# Patient Record
Sex: Male | Born: 1955 | Race: Black or African American | Hispanic: No | Smoking: Current some day smoker
Health system: Southern US, Community
[De-identification: ages and names within clinical notes are randomized; demographics above are authoritative.]

## PROBLEM LIST (undated history)

## (undated) DIAGNOSIS — J449 Chronic obstructive pulmonary disease, unspecified: Secondary | ICD-10-CM

## (undated) DIAGNOSIS — K219 Gastro-esophageal reflux disease without esophagitis: Secondary | ICD-10-CM

## (undated) DIAGNOSIS — C61 Malignant neoplasm of prostate: Secondary | ICD-10-CM

## (undated) DIAGNOSIS — I10 Essential (primary) hypertension: Secondary | ICD-10-CM

## (undated) DIAGNOSIS — J45909 Unspecified asthma, uncomplicated: Secondary | ICD-10-CM

---

## 1998-08-16 ENCOUNTER — Emergency Department (HOSPITAL_COMMUNITY): Admission: EM | Admit: 1998-08-16 | Discharge: 1998-08-16 | Payer: Self-pay | Admitting: Emergency Medicine

## 1998-11-01 ENCOUNTER — Emergency Department (HOSPITAL_COMMUNITY): Admission: EM | Admit: 1998-11-01 | Discharge: 1998-11-01 | Payer: Self-pay | Admitting: Emergency Medicine

## 1998-11-01 ENCOUNTER — Encounter: Payer: Self-pay | Admitting: Emergency Medicine

## 1999-08-08 ENCOUNTER — Emergency Department (HOSPITAL_COMMUNITY): Admission: EM | Admit: 1999-08-08 | Discharge: 1999-08-08 | Payer: Self-pay | Admitting: Emergency Medicine

## 1999-08-17 ENCOUNTER — Emergency Department (HOSPITAL_COMMUNITY): Admission: EM | Admit: 1999-08-17 | Discharge: 1999-08-17 | Payer: Self-pay | Admitting: Emergency Medicine

## 1999-08-17 ENCOUNTER — Encounter: Payer: Self-pay | Admitting: Emergency Medicine

## 1999-08-23 ENCOUNTER — Emergency Department (HOSPITAL_COMMUNITY): Admission: EM | Admit: 1999-08-23 | Discharge: 1999-08-23 | Payer: Self-pay | Admitting: Emergency Medicine

## 1999-08-27 ENCOUNTER — Ambulatory Visit (HOSPITAL_COMMUNITY): Admission: RE | Admit: 1999-08-27 | Discharge: 1999-08-27 | Payer: Self-pay | Admitting: Internal Medicine

## 1999-08-27 ENCOUNTER — Encounter: Admission: RE | Admit: 1999-08-27 | Discharge: 1999-08-27 | Payer: Self-pay | Admitting: Internal Medicine

## 1999-09-10 ENCOUNTER — Encounter: Payer: Self-pay | Admitting: Emergency Medicine

## 1999-09-10 ENCOUNTER — Emergency Department (HOSPITAL_COMMUNITY): Admission: EM | Admit: 1999-09-10 | Discharge: 1999-09-10 | Payer: Self-pay | Admitting: Emergency Medicine

## 1999-10-02 ENCOUNTER — Encounter: Admission: RE | Admit: 1999-10-02 | Discharge: 1999-10-02 | Payer: Self-pay | Admitting: Internal Medicine

## 1999-10-05 ENCOUNTER — Encounter: Admission: RE | Admit: 1999-10-05 | Discharge: 1999-10-05 | Payer: Self-pay | Admitting: Internal Medicine

## 1999-11-05 ENCOUNTER — Encounter: Admission: RE | Admit: 1999-11-05 | Discharge: 1999-11-05 | Payer: Self-pay | Admitting: Internal Medicine

## 2000-04-24 ENCOUNTER — Encounter: Admission: RE | Admit: 2000-04-24 | Discharge: 2000-04-24 | Payer: Self-pay | Admitting: Hematology and Oncology

## 2001-07-21 ENCOUNTER — Encounter: Payer: Self-pay | Admitting: Emergency Medicine

## 2001-07-21 ENCOUNTER — Emergency Department (HOSPITAL_COMMUNITY): Admission: EM | Admit: 2001-07-21 | Discharge: 2001-07-21 | Payer: Self-pay | Admitting: Emergency Medicine

## 2001-08-27 ENCOUNTER — Emergency Department (HOSPITAL_COMMUNITY): Admission: EM | Admit: 2001-08-27 | Discharge: 2001-08-27 | Payer: Self-pay | Admitting: Emergency Medicine

## 2001-08-27 ENCOUNTER — Encounter: Payer: Self-pay | Admitting: Emergency Medicine

## 2002-10-26 ENCOUNTER — Emergency Department (HOSPITAL_COMMUNITY): Admission: EM | Admit: 2002-10-26 | Discharge: 2002-10-26 | Payer: Self-pay | Admitting: Emergency Medicine

## 2006-05-19 ENCOUNTER — Emergency Department (HOSPITAL_COMMUNITY): Admission: EM | Admit: 2006-05-19 | Discharge: 2006-05-19 | Payer: Self-pay | Admitting: Emergency Medicine

## 2006-06-19 ENCOUNTER — Ambulatory Visit: Payer: Self-pay | Admitting: Nurse Practitioner

## 2006-06-20 ENCOUNTER — Ambulatory Visit: Payer: Self-pay | Admitting: Nurse Practitioner

## 2006-06-23 ENCOUNTER — Ambulatory Visit (HOSPITAL_COMMUNITY): Admission: RE | Admit: 2006-06-23 | Discharge: 2006-06-23 | Payer: Self-pay | Admitting: Nurse Practitioner

## 2006-06-23 ENCOUNTER — Ambulatory Visit: Payer: Self-pay | Admitting: Nurse Practitioner

## 2015-02-12 HISTORY — PX: WRIST SURGERY: SHX841

## 2017-05-29 DIAGNOSIS — T783XXA Angioneurotic edema, initial encounter: Secondary | ICD-10-CM | POA: Insufficient documentation

## 2018-01-23 ENCOUNTER — Encounter (HOSPITAL_COMMUNITY): Payer: Self-pay

## 2018-01-23 ENCOUNTER — Emergency Department (HOSPITAL_COMMUNITY): Payer: Medicare Other

## 2018-01-23 ENCOUNTER — Emergency Department (HOSPITAL_COMMUNITY)
Admission: EM | Admit: 2018-01-23 | Discharge: 2018-01-23 | Disposition: A | Discharge status: Home / Self Care | Payer: Medicare Other | Attending: Emergency Medicine | Admitting: Emergency Medicine

## 2018-01-23 ENCOUNTER — Other Ambulatory Visit: Payer: Self-pay

## 2018-01-23 DIAGNOSIS — I10 Essential (primary) hypertension: Secondary | ICD-10-CM | POA: Insufficient documentation

## 2018-01-23 DIAGNOSIS — J441 Chronic obstructive pulmonary disease with (acute) exacerbation: Secondary | ICD-10-CM | POA: Diagnosis not present

## 2018-01-23 DIAGNOSIS — F17200 Nicotine dependence, unspecified, uncomplicated: Secondary | ICD-10-CM | POA: Diagnosis not present

## 2018-01-23 DIAGNOSIS — Z79899 Other long term (current) drug therapy: Secondary | ICD-10-CM | POA: Insufficient documentation

## 2018-01-23 DIAGNOSIS — R0602 Shortness of breath: Secondary | ICD-10-CM | POA: Diagnosis present

## 2018-01-23 HISTORY — DX: Chronic obstructive pulmonary disease, unspecified: J44.9

## 2018-01-23 HISTORY — DX: Unspecified asthma, uncomplicated: J45.909

## 2018-01-23 HISTORY — DX: Essential (primary) hypertension: I10

## 2018-01-23 LAB — CBC WITH DIFFERENTIAL/PLATELET
Abs Immature Granulocytes: 0.02 10*3/uL (ref 0.00–0.07)
Basophils Absolute: 0 10*3/uL (ref 0.0–0.1)
Basophils Relative: 1 %
Eosinophils Absolute: 0.2 10*3/uL (ref 0.0–0.5)
Eosinophils Relative: 4 %
HCT: 45.7 % (ref 39.0–52.0)
Hemoglobin: 14.8 g/dL (ref 13.0–17.0)
Immature Granulocytes: 0 %
Lymphocytes Relative: 29 %
Lymphs Abs: 1.3 10*3/uL (ref 0.7–4.0)
MCH: 28.6 pg (ref 26.0–34.0)
MCHC: 32.4 g/dL (ref 30.0–36.0)
MCV: 88.4 fL (ref 80.0–100.0)
Monocytes Absolute: 0.7 10*3/uL (ref 0.1–1.0)
Monocytes Relative: 16 %
Neutro Abs: 2.3 10*3/uL (ref 1.7–7.7)
Neutrophils Relative %: 50 %
Platelets: 220 10*3/uL (ref 150–400)
RBC: 5.17 MIL/uL (ref 4.22–5.81)
RDW: 14.3 % (ref 11.5–15.5)
WBC: 4.6 10*3/uL (ref 4.0–10.5)
nRBC: 0 % (ref 0.0–0.2)

## 2018-01-23 LAB — BASIC METABOLIC PANEL
Anion gap: 12 (ref 5–15)
BUN: 10 mg/dL (ref 8–23)
CO2: 23 mmol/L (ref 22–32)
Calcium: 8.9 mg/dL (ref 8.9–10.3)
Chloride: 105 mmol/L (ref 98–111)
Creatinine, Ser: 0.98 mg/dL (ref 0.61–1.24)
GFR calc Af Amer: >60 mL/min (ref >60)
GFR calc non Af Amer: >60 mL/min (ref >60)
Glucose, Bld: 117 mg/dL — ABNORMAL HIGH (ref 70–99)
Potassium: 4 mmol/L (ref 3.5–5.1)
Sodium: 140 mmol/L (ref 135–145)

## 2018-01-23 LAB — I-STAT ARTERIAL BLOOD GAS, ED
Bicarbonate: 26 mmol/L (ref 20.0–28.0)
O2 Saturation: 91 %
Patient temperature: 98.6
TCO2: 27 mmol/L (ref 22–32)
pCO2 arterial: 47.5 mmHg (ref 32.0–48.0)
pH, Arterial: 7.347 — ABNORMAL LOW (ref 7.350–7.450)
pO2, Arterial: 66 mmHg — ABNORMAL LOW (ref 83.0–108.0)

## 2018-01-23 LAB — I-STAT TROPONIN, ED: Troponin i, poc: 0.01 ng/mL (ref 0.00–0.08)

## 2018-01-23 MED ORDER — IPRATROPIUM-ALBUTEROL 0.5-2.5 (3) MG/3ML IN SOLN
3.0000 mL | Freq: Once | RESPIRATORY_TRACT | Status: AC
  Administered 2018-01-23: 3 mL via RESPIRATORY_TRACT
  Filled 2018-01-23: qty 3

## 2018-01-23 MED ORDER — PREDNISONE 20 MG PO TABS: 40.0000 mg | ORAL_TABLET | Freq: Every day | ORAL | 0 refills | Status: DC

## 2018-01-23 MED ORDER — METHYLPREDNISOLONE SODIUM SUCC 125 MG IJ SOLR
60.0000 mg | Freq: Once | INTRAMUSCULAR | Status: AC
  Administered 2018-01-23: 60 mg via INTRAVENOUS
  Filled 2018-01-23: qty 2

## 2018-01-23 NOTE — ED Triage Notes (Signed)
Pt states shortness of breath X2 days. States he has used inhaler at home without relief. Noticeably short of breath. Cough present, denies chest pain.

## 2018-01-23 NOTE — Discharge Instructions (Addendum)
Dustin Alvarez,   I am sorry that you were feeling poorly. It looks like you have had a COPD exacerbation which may have been caused by not having the correct medication. We did some labs and a CXR that were all normal.  Please take the prednisone 40 mg daily for the next 4 days, you have already received a dose today.  Please continue to use the symbicort inhaler 2 puffs twice a day.  Please continue using the albuterol inhaler every 6 hours as needed for shortness of breath.   Please follow up with your Primary care provider and the pulmonologist in 1-2 weeks.   If your symptoms worsen or fail to improve or you develop any new symptoms please return to the ED to be evaluated.

## 2018-01-23 NOTE — ED Provider Notes (Signed)
[place] EMERGENCY DEPARTMENT Provider Note   CSN: [id number] Arrival date & time: 01/23/18  0710     History   Chief Complaint Chief Complaint  Patient presents with  . Shortness of Breath    HPI Dustin Alvarez is a 62 y.o. male.  This is a 62 year old male with history of COPD, hypertension, hyperlipidemia, and a smoking history presenting with a 3 to 4-day course of increased shortness of breath, wheezing, dizziness in a productive cough clear sputum.  Started when he was watching a ball game at home and has been getting progressively worse, he has been using his rescue inhaler about 3-4 times a day.  He is supposed to be on Symbicort daily however he does not take this consistently.  He recently came to [place] 2 days ago to visit family from [facility]  He denies any other symptoms such as fevers, chills, nausea, vomiting, diarrhea, constipation, lightheadedness, abdominal pain chest pain.  Of note he was hospitalized April 2019 for angioedema secondary to an ACE inhibitor and hypoxemic respiratory failure due to airway obstruction, he spent a few days in the ICU due to the angioedema and was intubated for 4 days. He was supposed to follow up with pulmonolgoy but patient reports that he did not.      Past Medical History:  Diagnosis Date  . Asthma   . COPD (chronic obstructive pulmonary disease) ([place])   . Hypertension     There are no active problems to display for this patient.   Past Surgical History:  Procedure Laterality Date  . WRIST SURGERY Left 2017      Home Medications    Prior to Admission medications   Medication Sig Start Date End Date Taking? Authorizing Provider  albuterol (PROVENTIL HFA;VENTOLIN HFA) 108 (90 Base) MCG/ACT inhaler Inhale 2 puffs into the lungs every 6 (six) hours as needed for wheezing or shortness of breath.   Yes [provider]  amLODipine (NORVASC) 5 MG tablet Take 5 mg by mouth  daily. 06/05/17  Yes [provider]  budesonide-formoterol (SYMBICORT) 160-4.5 MCG/ACT inhaler Inhale 2 puffs into the lungs 2 (two) times daily. 06/04/17  Yes [provider]  hydrochlorothiazide (MICROZIDE) 12.5 MG capsule Take 12.5 mg by mouth daily. 06/04/17  Yes [provider]  predniSONE (DELTASONE) 20 MG tablet Take 2 tablets (40 mg total) by mouth daily with breakfast. 01/23/18   [name], MD    Family History Family History  Problem Relation Age of Onset  . COPD Father   . Diabetes Sister   . Diabetes Brother     Social History Social History   Tobacco Use  . Smoking status: Current Every Day Smoker    Packs/day: 0.50  . Smokeless tobacco: Never Used  Substance Use Topics  . Alcohol use: Yes    Comment: 1/2 bottle liquor every other day  . Drug use: Never     Allergies   Patient has no known allergies.   Review of Systems Review of Systems  Constitutional: Negative for appetite change, chills and fever.  HENT: Negative for sinus pressure and sinus pain.   Respiratory: Positive for cough, shortness of breath and wheezing.   Cardiovascular: Negative for chest pain and palpitations.  Gastrointestinal: Negative for abdominal pain, constipation, diarrhea, nausea and vomiting.  Musculoskeletal: Positive for neck pain. Negative for arthralgias.  Neurological: Positive for light-headedness. Negative for weakness and numbness.     Physical Exam Updated Vital Signs  BP 124/83   Pulse 93   Temp 98 F (36.7 C) (Oral)   Resp (!) 23   SpO2 93%   Physical Exam Constitutional:      General: He is in acute distress.     Appearance: He is well-developed.  HENT:     Head: Normocephalic and atraumatic.     Mouth/Throat:     Mouth: Mucous membranes are moist.     Pharynx: Oropharynx is clear.  Eyes:     Extraocular Movements: Extraocular movements intact.     Pupils: Pupils are equal, round, and reactive to light.  Neck:      Musculoskeletal: Normal range of motion and neck supple.  Cardiovascular:     Rate and Rhythm: Regular rhythm. Tachycardia present.  Pulmonary:     Effort: Tachypnea and accessory muscle usage present.     Breath sounds: Decreased breath sounds and wheezing (diffuse) present.  Chest:     Chest wall: No mass.  Neurological:     Mental Status: He is alert.      ED Treatments / Results  Labs (all labs ordered are listed, but only abnormal results are displayed) Labs Reviewed  BASIC METABOLIC PANEL - Abnormal; Notable for the following components:      Result Value   Glucose, Bld 117 (*)    All other components within normal limits  I-STAT ARTERIAL BLOOD GAS, ED - Abnormal; Notable for the following components:   pH, Arterial 7.347 (*)    pO2, Arterial 66.0 (*)    All other components within normal limits  CBC WITH DIFFERENTIAL/PLATELET  I-STAT TROPONIN, ED    EKG EKG Interpretation  Date/Time:  Friday January 23 2018 07:19:42 EST Ventricular Rate:  109 PR Interval:    QRS Duration: 86 QT Interval:  322 QTC Calculation: 434 R Axis:   86 Text Interpretation:  Sinus tachycardia Ventricular premature complex Right atrial enlargement Borderline right axis deviation Anterior injury pattern No significant change since last tracing Abnormal ekg Confirmed by Carmin Muskrat [phone]) on 01/23/2018 7:45:55 AM   Radiology Dg Chest 2 View  Result Date: 01/23/2018 CLINICAL DATA:  Short of breath for 2 days, COPD, asthma, smoker EXAM: CHEST - 2 VIEW COMPARISON:  06/23/2006 FINDINGS: Normal heart size, mediastinal contours, and pulmonary vascularity. Medial RIGHT apical scarring stable. Lungs hyperinflated but clear. No acute infiltrate, pleural effusion, or pneumothorax. Osseous structures unremarkable. IMPRESSION: COPD changes without acute infiltrate. Electronically Signed   By: Lavonia Dana M.D.   On: 01/23/2018 08:23    Procedures Procedures (including critical care  time)  Medications Ordered in ED Medications  methylPREDNISolone sodium succinate (SOLU-MEDROL) 125 mg/2 mL injection 60 mg (60 mg Intravenous Given 01/23/18 0829)  ipratropium-albuterol (DUONEB) 0.5-2.5 (3) MG/3ML nebulizer solution 3 mL (3 mLs Nebulization Given 01/23/18 0823)  ipratropium-albuterol (DUONEB) 0.5-2.5 (3) MG/3ML nebulizer solution 3 mL (3 mLs Nebulization Given 01/23/18 0907)     Initial Impression / Assessment and Plan / ED Course  I have reviewed the triage vital signs and the nursing notes.  Pertinent labs & imaging results that were available during my care of the patient were reviewed by me and considered in my medical decision making (see chart for details).     This is a 62 year old male with history of COPD, hypertension, tobacco use who presented with a 3 to 4-day history of worsening shortness of breath, wheezing, dizziness.  Has had a increase in his rescue inhaler up to 3-4 times a day.  He denied  any signs of infection, and it not having any chest pain or abdominal pain.  He still smoking half pack per day and has had a recent sick contact. Will assess for cause of his COPD exacerbation with CBC, CMP, chest x-ray, troponin.  Patient was given a course of DuoNeb's and steroid which he reports helped a lot, on recheck his wheezing has improved and he is not using accessory muscles to breath.   CXR was negative. CBC is normal, ABG was unremarkable, slight respiratory acidosis but the pH is 7.347. BMP was normal. Troponin was negative. Patient was given another breathing treatment and he reports significant improvement. He appears comfortable and his lung exam showed minimal wheezing.   This is likely a COPD exacerbation 2/2 to medication non-compliance. Will prescribe a 5 day course of prednisone and advise patient to continue his Symbicort on a daily basis and use albuterol as needed. Advised patient to follow up with his PCP and the pulmonologist.   Final Clinical  Impressions(s) / ED Diagnoses   Final diagnoses:  COPD exacerbation ([place])  Shortness of breath    ED Discharge Orders         Ordered    predniSONE (DELTASONE) 20 MG tablet  Daily with breakfast     01/23/18 1003           [name], MD 01/23/18 1120    Carmin Muskrat, MD 01/23/18 1636

## 2019-09-22 ENCOUNTER — Ambulatory Visit (INDEPENDENT_AMBULATORY_CARE_PROVIDER_SITE_OTHER): Payer: Medicare Other | Admitting: Internal Medicine

## 2019-09-22 ENCOUNTER — Encounter: Payer: Self-pay | Admitting: Internal Medicine

## 2019-09-22 ENCOUNTER — Other Ambulatory Visit: Payer: Self-pay

## 2019-09-22 DIAGNOSIS — C61 Malignant neoplasm of prostate: Secondary | ICD-10-CM | POA: Diagnosis present

## 2019-09-22 DIAGNOSIS — T464X5A Adverse effect of angiotensin-converting-enzyme inhibitors, initial encounter: Secondary | ICD-10-CM | POA: Insufficient documentation

## 2019-09-22 DIAGNOSIS — T783XXA Angioneurotic edema, initial encounter: Secondary | ICD-10-CM | POA: Insufficient documentation

## 2019-09-22 DIAGNOSIS — C7951 Secondary malignant neoplasm of bone: Secondary | ICD-10-CM | POA: Insufficient documentation

## 2019-09-22 NOTE — Patient Instructions (Addendum)
Thank you for allowing [place] to provide your care today.  He came into establish care with [place]. We will request your prior oncologist and primary care to send [place] your medical record. Please take your medications as instructed by your oncologist and your prior PCP.  Follow up in our clinic in 1-2 weeks to establish care.   As always, if having severe symptoms, please seek medical attention at emergency room. Should you have any questions or concerns please call the internal medicine clinic at [phone].    Thank you!

## 2019-09-22 NOTE — Progress Notes (Addendum)
**Note Dustin-Identified via Obfuscation** New Patient Office Visit  Subjective:  Patient ID: Dustin Alvarez, male    DOB: 1955-04-16  Age: 64 y.o. MRN: [id number]  CC: Establish care for prostate cancer   HPI TYRANN DONAHO male with history of COPD, HTN, HLD, tobacco use, presents to establish care for prostate cancer. Please refer to problem based charting for further details and assessment and plan of current problem and chronic medical conditions. He moved to [place] about a month ago, and had a PCP at [facility] but willing to change his PCP.    Past Medical History:  Diagnosis Date  . Asthma   . COPD (chronic obstructive pulmonary disease) ([place])   . Hypertension     Medications: Albuterol as needed, trelegy  Oxycodone, finasteride 5 mg, bicalutamide 50 mg, tamsulosin 0.4 mg qd, lisinopril 20 mg QD, Protonix, trazodone 50 mg at bed time, HCTZ 12.5 mg daily, amlodipine 5 mg daily   Past Surgical History:  Procedure Laterality Date  . WRIST SURGERY Left 2017    Family History  Problem Relation Age of Onset  . COPD Father   . Diabetes Sister   . Diabetes Brother    Social history: Moved to [place] from [place] few months ago and after he was diagnosed with prostate cancer. Patient now lives with his daughter in [place]. Endorses smoking 10 cigarette daily for 35 years (used to smoke 1.5 pack ady until 3 years ago), drinks 1 shot of liquor  a day. No illicit drug use   Social History   Socioeconomic History  . Marital status: Legally Separated    Spouse name: Not on file  . Number of children: Not on file  . Years of education: Not on file  . Highest education level: Not on file  Occupational History  . Not on file  Tobacco Use  . Smoking status: Current Every Day Smoker    Packs/day: 0.50  . Smokeless tobacco: Never Used  Substance and Sexual Activity  . Alcohol use: Yes    Comment: 1/2 bottle liquor every other day  . Drug use: Never  . Sexual activity: Not on file  Other Topics  Concern  . Not on file  Social History Narrative  . Not on file   Social Determinants of Health   Financial Resource Strain:   . Difficulty of Paying Living Expenses:   Food Insecurity:   . Worried About Charity fundraiser in the Last Year:   . Arboriculturist in the Last Year:   Transportation Needs:   . Film/video editor (Medical):   Marland Kitchen Lack of Transportation (Non-Medical):   Physical Activity:   . Days of Exercise per Week:   . Minutes of Exercise per Session:   Stress:   . Feeling of Stress :   Social Connections:   . Frequency of Communication with Friends and Family:   . Frequency of Social Gatherings with Friends and Family:   . Attends Religious Services:   . Active Member of Clubs or Organizations:   . Attends Archivist Meetings:   Marland Kitchen Marital Status:   Intimate Partner Violence:   . Fear of Current or Ex-Partner:   . Emotionally Abused:   Marland Kitchen Physically Abused:   . Sexually Abused:     ROS Review of Systems  Objective:   Today's Vitals: BP 110/70 (BP Location: Right Arm, Patient Position: Sitting)   Pulse 96   Temp 98.2 F (36.8 C) (Oral)   Physical Exam  Assessment & Plan:   Problem List Items Addressed This Visit      Genitourinary   Prostate cancer [facility])      Outpatient Encounter Medications as of 09/22/2019  Medication Sig  . albuterol (PROVENTIL HFA;VENTOLIN HFA) 108 (90 Base) MCG/ACT inhaler Inhale 2 puffs into the lungs every 6 (six) hours as needed for wheezing or shortness of breath.  Marland Kitchen amLODipine (NORVASC) 5 MG tablet Take 5 mg by mouth daily.  . budesonide-formoterol (SYMBICORT) 160-4.5 MCG/ACT inhaler Inhale 2 puffs into the lungs 2 (two) times daily.  . hydrochlorothiazide (MICROZIDE) 12.5 MG capsule Take 12.5 mg by mouth daily.  . predniSONE (DELTASONE) 20 MG tablet Take 2 tablets (40 mg total) by mouth daily with breakfast.   No facility-administered encounter medications on file as of 09/22/2019.    Follow-up: No  follow-ups on file.   Dewayne Hatch, MD

## 2019-09-23 ENCOUNTER — Encounter: Payer: Self-pay | Admitting: Internal Medicine

## 2019-09-23 NOTE — Assessment & Plan Note (Addendum)
This is the first visit at [facility].  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  He has his medications with him and is on: Finasteride, Tamsulosin, bicalutamide 50 mg. No issue with urination but he has had continuous back pain and takes Oxycodone-Acetaminophen for that. No associated red flag per Hx and Ph/e. He was seen at [facility] center once but would like to switch the PCP.   -We will request his medical record from [place] and his prior oncologist at [facility] (Dr. Darrelyn Hillock). He will continue his current medications meanwhile -F/u in [facility] in 1-2 weeks to establish care and to refer to a new oncologist

## 2019-09-27 NOTE — Progress Notes (Signed)
Internal Medicine Clinic Attending  Case discussed with Dr. Masoudi  At the time of the visit.  We reviewed the resident's history and exam and pertinent patient test results.  I agree with the assessment, diagnosis, and plan of care documented in the resident's note.  

## 2019-09-28 NOTE — Addendum Note (Signed)
Addended byDewayne Hatch on: 09/28/2019 05:56 PM   Modules accepted: Level of Service

## 2019-09-29 ENCOUNTER — Ambulatory Visit (INDEPENDENT_AMBULATORY_CARE_PROVIDER_SITE_OTHER): Payer: Medicare Other | Admitting: Internal Medicine

## 2019-09-29 VITALS — BP 117/61 | HR 113 | Temp 98.6°F | Wt 140.7 lb

## 2019-09-29 DIAGNOSIS — M545 Low back pain: Secondary | ICD-10-CM | POA: Diagnosis not present

## 2019-09-29 DIAGNOSIS — C61 Malignant neoplasm of prostate: Secondary | ICD-10-CM | POA: Diagnosis not present

## 2019-09-29 DIAGNOSIS — C7951 Secondary malignant neoplasm of bone: Secondary | ICD-10-CM | POA: Diagnosis not present

## 2019-09-29 DIAGNOSIS — I10 Essential (primary) hypertension: Secondary | ICD-10-CM | POA: Insufficient documentation

## 2019-09-29 DIAGNOSIS — K219 Gastro-esophageal reflux disease without esophagitis: Secondary | ICD-10-CM | POA: Diagnosis not present

## 2019-09-29 DIAGNOSIS — G4700 Insomnia, unspecified: Secondary | ICD-10-CM

## 2019-09-29 DIAGNOSIS — J449 Chronic obstructive pulmonary disease, unspecified: Secondary | ICD-10-CM | POA: Diagnosis not present

## 2019-09-29 MED ORDER — ALBUTEROL SULFATE HFA 108 (90 BASE) MCG/ACT IN AERS: 2.0000 | INHALATION_SPRAY | Freq: Four times a day (QID) | RESPIRATORY_TRACT | 3 refills | Status: DC | PRN

## 2019-09-29 MED ORDER — HYDROCHLOROTHIAZIDE 12.5 MG PO CAPS: 12.5000 mg | ORAL_CAPSULE | Freq: Every day | ORAL | 1 refills | Status: DC

## 2019-09-29 MED ORDER — PANTOPRAZOLE SODIUM 40 MG PO TBEC: 40.0000 mg | DELAYED_RELEASE_TABLET | Freq: Every day | ORAL | 3 refills | Status: DC

## 2019-09-29 MED ORDER — TRELEGY ELLIPTA 100-62.5-25 MCG/INH IN AEPB: 2.0000 | INHALATION_SPRAY | Freq: Every day | RESPIRATORY_TRACT | 3 refills | Status: DC

## 2019-09-29 MED ORDER — BICALUTAMIDE 50 MG PO TABS: 50.0000 mg | ORAL_TABLET | Freq: Every day | ORAL | 0 refills | Status: DC

## 2019-09-29 MED ORDER — OXYCODONE-ACETAMINOPHEN 10-325 MG PO TABS: 1.0000 | ORAL_TABLET | Freq: Four times a day (QID) | ORAL | 0 refills | Status: DC | PRN

## 2019-09-29 MED ORDER — TRAZODONE HCL 50 MG PO TABS: 50.0000 mg | ORAL_TABLET | Freq: Every day | ORAL | 0 refills | Status: DC

## 2019-09-29 MED ORDER — FINASTERIDE 5 MG PO TABS: 5.0000 mg | ORAL_TABLET | Freq: Every day | ORAL | 2 refills | Status: DC

## 2019-09-29 MED ORDER — AMLODIPINE BESYLATE 5 MG PO TABS: 5.0000 mg | ORAL_TABLET | Freq: Every day | ORAL | 3 refills | Status: DC

## 2019-09-29 NOTE — Patient Instructions (Signed)
Thank you for allowing [place] to provide your care today.  Came here to establish care with [place].   I sent refill for your medications. I sent a referral to oncologist in [place] to continue treatment for your prostate cancer. Someone from their office will call you for the appointment.   Please come back to clinic in 1 month to see your primary care doctor or earlier if your symptoms get worse or not improved. As always, if having severe symptoms, please seek medical attention at emergency room. Should you have any questions or concerns please call the internal medicine clinic at [phone].    Thank you!

## 2019-09-29 NOTE — Progress Notes (Signed)
   CC: Establish care, continuous low back pain  HPI:  Mr.Jaquawn Darnell Level Mudrick is a 64 y.o. 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  This is his second visit at [facility].  We were able to get some of his medical records.  Please refer to problem based charting for further details and assessment and plan of current problem and chronic medical conditions.  Past Medical History:  Diagnosis Date  . Asthma   . COPD (chronic obstructive pulmonary disease) ([place])   . Hypertension   .  Prostate cancer  Review of Systems:  Constitutional: Negative for chills and fever.  Respiratory: Negative for shortness of breath.   Cardiovascular: Negative for chest pain Gastrointestinal: Negative for abdominal pain, nausea and vomiting.  Neurological: Negative for dizziness and headaches.   Positive for mild numbness of both feet.  Negative for weakness GU: Negative for urinary incontinency.  Negative for hematuria MSK: Positive for low back pain  Physical Exam:  Vitals:   09/29/19 1044  BP: 117/61  Pulse: (!) 113  Temp: 98.6 F (37 C)  TempSrc: Oral  SpO2: 98%  Weight: 140 lb 11.2 oz (63.8 kg)   Constitutional: Moderately cachectic, patient is uncomfortable due to low back pain.   Cardiovascular: regular rhythm, tachycardic, nl S1S2, no murmur,  no LEE Respiratory: Effort normal and breath sounds normal. No respiratory distress. No wheezes.  GI: No distension. Neurological: Is alert and oriented x 3, sensation is intact on bilateral lower extremity.  Mildly positive SLR at left side.  No motor deficit (hard to assess precisely though, due to severe low back pain) Skin: Not diaphoretic. No erythema.  Psychiatric: Cooperative with exam.  Normal mood and affect. Behavior is normal.   Assessment & Plan:   See Encounters Tab for problem based charting.  Patient discussed with  Dr. Evette Doffing

## 2019-09-30 ENCOUNTER — Encounter: Payer: Self-pay | Admitting: Internal Medicine

## 2019-09-30 ENCOUNTER — Emergency Department (HOSPITAL_COMMUNITY)
Admission: EM | Admit: 2019-09-30 | Discharge: 2019-10-01 | Disposition: A | Discharge status: Home / Self Care | Payer: Medicare Other | Attending: Emergency Medicine | Admitting: Emergency Medicine

## 2019-09-30 ENCOUNTER — Other Ambulatory Visit: Payer: Self-pay

## 2019-09-30 ENCOUNTER — Encounter (HOSPITAL_COMMUNITY): Payer: Self-pay | Admitting: Emergency Medicine

## 2019-09-30 ENCOUNTER — Ambulatory Visit (HOSPITAL_COMMUNITY)
Admission: EM | Admit: 2019-09-30 | Discharge: 2019-09-30 | Disposition: A | Discharge status: Home / Self Care | Payer: Medicare Other

## 2019-09-30 DIAGNOSIS — Z5321 Procedure and treatment not carried out due to patient leaving prior to being seen by health care provider: Secondary | ICD-10-CM | POA: Insufficient documentation

## 2019-09-30 DIAGNOSIS — T7840XA Allergy, unspecified, initial encounter: Secondary | ICD-10-CM | POA: Diagnosis not present

## 2019-09-30 DIAGNOSIS — T783XXA Angioneurotic edema, initial encounter: Secondary | ICD-10-CM | POA: Diagnosis not present

## 2019-09-30 DIAGNOSIS — G4700 Insomnia, unspecified: Secondary | ICD-10-CM | POA: Insufficient documentation

## 2019-09-30 DIAGNOSIS — J449 Chronic obstructive pulmonary disease, unspecified: Secondary | ICD-10-CM | POA: Insufficient documentation

## 2019-09-30 LAB — BASIC METABOLIC PANEL
Anion gap: 10 (ref 5–15)
BUN: 15 mg/dL (ref 8–23)
CO2: 27 mmol/L (ref 22–32)
Calcium: 9.5 mg/dL (ref 8.9–10.3)
Chloride: 98 mmol/L (ref 98–111)
Creatinine, Ser: 0.94 mg/dL (ref 0.61–1.24)
GFR calc Af Amer: >60 mL/min (ref >60)
GFR calc non Af Amer: >60 mL/min (ref >60)
Glucose, Bld: 129 mg/dL — ABNORMAL HIGH (ref 70–99)
Potassium: 4.3 mmol/L (ref 3.5–5.1)
Sodium: 135 mmol/L (ref 135–145)

## 2019-09-30 LAB — CBC
HCT: 34 % — ABNORMAL LOW (ref 39.0–52.0)
Hemoglobin: 11.2 g/dL — ABNORMAL LOW (ref 13.0–17.0)
MCH: 28.3 pg (ref 26.0–34.0)
MCHC: 32.9 g/dL (ref 30.0–36.0)
MCV: 85.9 fL (ref 80.0–100.0)
Platelets: 506 10*3/uL — ABNORMAL HIGH (ref 150–400)
RBC: 3.96 MIL/uL — ABNORMAL LOW (ref 4.22–5.81)
RDW: 15.5 % (ref 11.5–15.5)
WBC: 5.5 10*3/uL (ref 4.0–10.5)
nRBC: 0 % (ref 0.0–0.2)

## 2019-09-30 MED ORDER — FAMOTIDINE 20 MG PO TABS
20.0000 mg | ORAL_TABLET | Freq: Once | ORAL | Status: AC
  Administered 2019-09-30: 20 mg via ORAL
  Filled 2019-09-30: qty 1

## 2019-09-30 MED ORDER — DIPHENHYDRAMINE HCL 25 MG PO CAPS
50.0000 mg | ORAL_CAPSULE | Freq: Once | ORAL | Status: AC
  Administered 2019-09-30: 50 mg via ORAL
  Filled 2019-09-30: qty 2

## 2019-09-30 MED ORDER — PREDNISONE 20 MG PO TABS
60.0000 mg | ORAL_TABLET | Freq: Once | ORAL | Status: AC
  Administered 2019-09-30: 60 mg via ORAL
  Filled 2019-09-30: qty 3

## 2019-09-30 NOTE — Assessment & Plan Note (Signed)
BP today is well controlled at 113/61. -Sending refill for Amlodipine, HCTZ today.

## 2019-09-30 NOTE — ED Triage Notes (Signed)
Pt c/o of allergic reaction. Reaction began last night around 11:30pm. Pt is not seen in distress in triage. Pt states he believes his son gave him the wrong medication. Pt unsure which medication gave him the reaction. He also had shrimp last night, and spit it out.

## 2019-09-30 NOTE — ED Notes (Signed)
Patient is being discharged from the Urgent Care and sent to the Emergency Department . Per [place], [place], patient is in need of higher level of care due to angioedema. Patient is aware and verbalizes understanding of plan of care. There were no vitals filed for this visit.

## 2019-09-30 NOTE — Assessment & Plan Note (Signed)
Sent refill for Albuterol and Trelegy Elipta.

## 2019-09-30 NOTE — Assessment & Plan Note (Addendum)
This is the first visit at [facility] and to establish care.  We were able to receiv some of his medical records.  No urinary symptoms, (no hematuria, no pain or discomfort with urination and has good urine stream.   Bone metastasis: (Lumbar spine?):  We do not have the record with his prior MRI yet. Per patient, he got some radiation therapy few months ago and before coming to [facility] and he was supposed to get some chemotherapy after that, that did not happen, since he has not seen oncologist in the area yet. He is on Bicalutamide (Casodex).  Unfortunately, Dustin Alvarez still has continuous low back pain. He is taking Oxy-Acetaminophen 10-325 q 6h, with good but non lasting response.  Has some positive SLR on left leg. No urinary or fecal incontinency, no notable weakness (hard to assess due to pain), sensation of lower extremities is intact on exam.  Discussed with patient that he can continue taking Oxycodone for pain but he may need further Tx such as radiation therapy for his metastatic bone disease to get better relief. He agrees to be referred to oncologist as soon as possible.  -Ambulatory referral to oncologist (Dr. Alen Blew) -Sending refill for Oxycodone-Acetaminophen 10-325 q6 h and can get extra pills a day on bad days. Provided #130 pills. (PDMP reviewed) -Sending refill for Casodex, Tamsulosin and Finasteride -Red flag symptoms and strict ER precautions reviewed with patient -f/u in clinic in 1 month to see PCP

## 2019-09-30 NOTE — Assessment & Plan Note (Signed)
Sending refill for Protonix 40 mg QD today.

## 2019-09-30 NOTE — ED Notes (Signed)
Pt called to triage no answer. NT checking outside.

## 2019-09-30 NOTE — Assessment & Plan Note (Signed)
Sent refill for Trazodone.

## 2019-10-01 NOTE — Progress Notes (Signed)
Internal Medicine Clinic Attending  Case discussed with Dr. Masoudi  At the time of the visit.  We reviewed the resident's history and exam and pertinent patient test results.  I agree with the assessment, diagnosis, and plan of care documented in the resident's note.  

## 2019-10-01 NOTE — ED Notes (Signed)
Pt was told the risk of leaving. Pt said he could no longer wait.

## 2019-10-05 ENCOUNTER — Other Ambulatory Visit: Payer: Self-pay

## 2019-10-05 ENCOUNTER — Ambulatory Visit: Payer: Medicare Other | Admitting: Internal Medicine

## 2019-10-05 ENCOUNTER — Encounter: Payer: Self-pay | Admitting: Internal Medicine

## 2019-10-05 VITALS — BP 107/70 | HR 102 | Temp 98.2°F | Ht 72.0 in | Wt 137.2 lb

## 2019-10-05 DIAGNOSIS — J449 Chronic obstructive pulmonary disease, unspecified: Secondary | ICD-10-CM

## 2019-10-05 DIAGNOSIS — W19XXXA Unspecified fall, initial encounter: Secondary | ICD-10-CM | POA: Insufficient documentation

## 2019-10-05 NOTE — Assessment & Plan Note (Addendum)
He reports having a mechanical fall (tripped over a rug). He did not hurt his head. He fell on his rt side but was able to catch him self and denies any major trauma. He was able to walk after that. He does not think he broke his hip. He has chronic back pain but no worsening. No new symptoms after fall. He mentions that he is feeling OK and just came in to let us know about the fall.  Denies any prodromal syndrome such as dizziness, drowsiness, chest pain or shortness of breath.  On exam, he is alert and oriented. no bruise on hip. Hip exam is unremarkable and no significant tenderness to palpation. He is much more comfortable today than last 2 visits.   No evidence of fx and he does not want imaging.  Provided reassurance. He declined AVS.

## 2019-10-05 NOTE — Progress Notes (Signed)
   CC: Mechanical fall  HPI:  Dustin Alvarez is a 64 y.o. male with PMHx as documented below, presented to let me know about the fall he had yesterday. He came to [facility] today to sign some medical release form (to get his record from his prior oncologist).  When he was talking to our staff, he reported a fall yesterday, and then decided to be evaluated in person. Please refer to problem based charting for further details and assessment and plan of current problem and chronic medical conditions.  Review of Systems:  No new pain, no new numbness or tingling, no dizziness  Physical Exam:   Vitals:   10/05/19 1439  BP: 107/70  Pulse: (!) 102  Temp: 98.2 F (36.8 C)  TempSrc: Oral  SpO2: 98%  Weight: 137 lb 3.2 oz (62.2 kg)  Height: 6' (1.829 m)   Constitutional: No acute distress.  CV: Mildly tachycardiac, regular rhythm, no murmur Respiratory: Effort normal and breath sounds normal. No respiratory distress. Neurological: Is alert and oriented x 3 MSK: No tenderness to palpation of Rt hip post trauma  Assessment & Plan:   See Encounters Tab for problem based charting.  Patient discussed with Dr. Evette Doffing

## 2019-10-06 NOTE — Progress Notes (Signed)
Internal Medicine Clinic Attending  Case discussed with Dr. Masoudi  At the time of the visit.  We reviewed the resident's history and exam and pertinent patient test results.  I agree with the assessment, diagnosis, and plan of care documented in the resident's note.  

## 2019-10-27 MED ORDER — TRELEGY ELLIPTA 100-62.5-25 MCG/INH IN AEPB: 2.0000 | INHALATION_SPRAY | Freq: Every day | RESPIRATORY_TRACT | 0 refills | Status: DC

## 2019-10-27 NOTE — Addendum Note (Signed)
Addended by: Alexandria Lodge on: 10/27/2019 11:28 AM   Modules accepted: Orders

## 2019-10-29 ENCOUNTER — Other Ambulatory Visit: Payer: Self-pay | Admitting: Internal Medicine

## 2019-10-29 ENCOUNTER — Encounter: Payer: Medicare Other | Admitting: Internal Medicine

## 2019-10-29 ENCOUNTER — Other Ambulatory Visit: Payer: Self-pay | Admitting: *Deleted

## 2019-10-29 DIAGNOSIS — C7951 Secondary malignant neoplasm of bone: Secondary | ICD-10-CM

## 2019-10-29 DIAGNOSIS — C61 Malignant neoplasm of prostate: Secondary | ICD-10-CM

## 2019-10-29 MED ORDER — TRAZODONE HCL 50 MG PO TABS: 50.0000 mg | ORAL_TABLET | Freq: Every day | ORAL | 0 refills | Status: DC

## 2019-10-29 MED ORDER — OXYCODONE-ACETAMINOPHEN 10-325 MG PO TABS: 1.0000 | ORAL_TABLET | Freq: Four times a day (QID) | ORAL | 0 refills | Status: DC | PRN

## 2019-10-29 MED ORDER — TAMSULOSIN HCL 0.4 MG PO CAPS: 0.4000 mg | ORAL_CAPSULE | Freq: Two times a day (BID) | ORAL | 0 refills | Status: DC

## 2019-10-29 NOTE — Telephone Encounter (Signed)
Pt arrived late for his 64 PM appt; stated he's in a lot of pain, unable to sit d/t hip pain. No open appts this afternoon; suggested going to urgent care. Person with him stated they will not see him d/t his hx of prostate cancer, he has to go to the ED. He asked if he could get refills on all his medications esp Percocet, he will be out by tomorrow - I will send requests to his doctor. Appt re-schedule for Monday 9/20 @1445pm  with Dr Darrick Meigs. Also requesting a refill on Flomax which he takes BID; not on current med list. Stated a list of meds was given to the doctor on his first visit.

## 2019-10-29 NOTE — Telephone Encounter (Signed)
Refilled.   Found the tamsulosin on an outside Rx in our system so refilled as well.   Thank you  Gilles Chiquito, MD

## 2019-10-29 NOTE — Telephone Encounter (Signed)
Called pt about refills; no answer, left message on self-identified vm.

## 2019-11-01 ENCOUNTER — Encounter: Payer: Self-pay | Admitting: Internal Medicine

## 2019-11-01 ENCOUNTER — Other Ambulatory Visit: Payer: Self-pay

## 2019-11-01 ENCOUNTER — Ambulatory Visit (INDEPENDENT_AMBULATORY_CARE_PROVIDER_SITE_OTHER): Payer: Medicare Other | Admitting: Internal Medicine

## 2019-11-01 VITALS — BP 124/88 | HR 101 | Temp 98.1°F | Ht 72.0 in | Wt 137.4 lb

## 2019-11-01 DIAGNOSIS — Z23 Encounter for immunization: Secondary | ICD-10-CM | POA: Diagnosis not present

## 2019-11-01 DIAGNOSIS — J449 Chronic obstructive pulmonary disease, unspecified: Secondary | ICD-10-CM | POA: Diagnosis not present

## 2019-11-01 DIAGNOSIS — C61 Malignant neoplasm of prostate: Secondary | ICD-10-CM | POA: Diagnosis not present

## 2019-11-01 DIAGNOSIS — G893 Neoplasm related pain (acute) (chronic): Secondary | ICD-10-CM

## 2019-11-01 DIAGNOSIS — D649 Anemia, unspecified: Secondary | ICD-10-CM | POA: Diagnosis present

## 2019-11-01 DIAGNOSIS — Z0000 Encounter for general adult medical examination without abnormal findings: Secondary | ICD-10-CM

## 2019-11-01 DIAGNOSIS — C7951 Secondary malignant neoplasm of bone: Secondary | ICD-10-CM | POA: Diagnosis not present

## 2019-11-01 DIAGNOSIS — I10 Essential (primary) hypertension: Secondary | ICD-10-CM | POA: Diagnosis not present

## 2019-11-01 DIAGNOSIS — Z0001 Encounter for general adult medical examination with abnormal findings: Secondary | ICD-10-CM | POA: Diagnosis present

## 2019-11-01 MED ORDER — OXYCODONE HCL ER 20 MG PO T12A
20.0000 mg | EXTENDED_RELEASE_TABLET | Freq: Two times a day (BID) | ORAL | 0 refills | Status: DC
Start: 2019-11-01 — End: 2019-11-04

## 2019-11-01 NOTE — Progress Notes (Signed)
Office Visit   Patient ID: Dustin Alvarez, male    DOB: 1955-08-30, 64 y.o.   MRN: [id number]  Subjective:  CC: pain management, establishment of care  HPI 64 y.o. presents today for the above.  This is a 64 year old male with hypertension, COPD, and prostate cancer who recently moved here from [place] to reside with family.  His prostate cancer was previously managed in [place].  He is on Casodex for management.  He relates to me that he is also received radiation therapy as well as some type of injection into the coccyx bone however is unsure what that injection was.  He notes this was about 4 months ago and did improve the pain there. He notes that he has an initial oncology appointment at the cancer center on Friday.  His primary complaint today is pain.  The pain is located in his sacrum and coccygeal regions.  This is been an ongoing issue for which he is been on chronic opioids (Percocet 10 mg every 6 hours).  He denies any recent trauma to the area.  He feels like it is related to his prostate cancer.  I asked if the prostate cancer is in his bone however he tells me that he has never been told that.  He denies a prior history of PET scans. He notes that the Percocets do work however only last 2 to 3 hours and then wear off after which time he is in excruciating pain.  This means he has to wait 3 to 4 hours to take another pain pill.  He is also endorsing 2 to 3 months of night sweats.  He is also requesting referral to home health.  He has had physical deconditioning since his prostate cancer diagnosis.  We also discussed health maintenance and preventative screening topics.  He notes that he had a colonoscopy in [place] back in 2020 and that it was normal.  Unfortunately he does not have those records.     ACTIVE MEDICATIONS   Current Outpatient Medications on File Prior to Visit  Medication Sig Dispense Refill  . albuterol (VENTOLIN  HFA) 108 (90 Base) MCG/ACT inhaler Inhale 2 puffs into the lungs every 6 (six) hours as needed for wheezing or shortness of breath. 8 g 3  . amLODipine (NORVASC) 5 MG tablet Take 1 tablet (5 mg total) by mouth daily. 90 tablet 3  . bicalutamide (CASODEX) 50 MG tablet Take 1 tablet (50 mg total) by mouth daily. 30 tablet 0  . budesonide-formoterol (SYMBICORT) 160-4.5 MCG/ACT inhaler Inhale 2 puffs into the lungs 2 (two) times daily.    . finasteride (PROSCAR) 5 MG tablet Take 1 tablet (5 mg total) by mouth daily. 30 tablet 2  . Fluticasone-Umeclidin-Vilant (TRELEGY ELLIPTA) 100-62.5-25 MCG/INH AEPB Inhale 2 puffs into the lungs daily. 180 each 0  . hydrochlorothiazide (MICROZIDE) 12.5 MG capsule Take 1 capsule (12.5 mg total) by mouth daily. 90 capsule 1  . pantoprazole (PROTONIX) 40 MG tablet Take 1 tablet (40 mg total) by mouth daily. 90 tablet 3  . tamsulosin (FLOMAX) 0.4 MG CAPS capsule Take 1 capsule (0.4 mg total) by mouth in the morning and at bedtime. 60 capsule 0  . traZODone (DESYREL) 50 MG tablet Take 1 tablet (50 mg total) by mouth at bedtime. 30 tablet 0   No current facility-administered medications on file prior to visit.    ROS  Review of Systems  Constitutional: Positive for activity change and fatigue. Negative for  chills and fever.  Respiratory: Negative for cough and shortness of breath.   Cardiovascular: Negative for chest pain.  Gastrointestinal: Negative for abdominal pain.  Genitourinary: Positive for difficulty urinating.  Musculoskeletal: Positive for arthralgias.  Neurological: Negative.     Objective:   BP 124/88 (BP Location: Right Arm, Patient Position: Bed low/side rails up, Cuff Size: Normal) Comment (Patient Position): flat on back elevated head  Pulse (!) 101   Temp 98.1 F (36.7 C) (Oral)   Ht 6' (1.829 m)   Wt 137 lb 6.4 oz (62.3 kg)   SpO2 100% Comment: room air  BMI 18.63 kg/m  Wt Readings from Last 3 Encounters:  11/01/19 137 lb 6.4 oz (62.3  kg)  10/05/19 137 lb 3.2 oz (62.2 kg)  09/30/19 143 lb (64.9 kg)   BP Readings from Last 3 Encounters:  11/01/19 124/88  10/05/19 107/70  10/01/19 109/79   Physical Exam Constitutional:      Comments: Chronically ill-appearing  Cardiovascular:     Rate and Rhythm: Normal rate and regular rhythm.  Pulmonary:     Effort: Pulmonary effort is normal.     Comments: Lung sounds diminished. Musculoskeletal:     Right lower leg: No edema.     Left lower leg: No edema.  Skin:    General: Skin is warm and dry.  Neurological:     General: No focal deficit present.     Mental Status: He is oriented to person, place, and time.     Health Maintenance:   Health Maintenance  Topic Date Due  . Hepatitis C Screening  Never done  . COVID-19 Vaccine (1) Never done  . COLONOSCOPY  Never done  . PNA vac Low Risk Adult (2 of 2 - PPSV23) 10/31/2020  . TETANUS/TDAP  10/31/2029  . INFLUENZA VACCINE  Completed     Assessment & Plan:   Problem List Items Addressed This Visit      Cardiovascular and Mediastinum   Hypertension    Blood pressure is well controlled in the office today.  On amlodipine 5 mg and hydrochlorothiazide 12.5 mg. Plan:  --Continue current management --Lipid panel for ASCVD risk calculation      Relevant Orders   Lipid panel     Respiratory   COPD (chronic obstructive pulmonary disease) (HCC)    Lung sounds are diminished on exam however patient denies any change in symptoms at this time. Plan: Continue Trelegy, Symbicort, and albuterol        Musculoskeletal and Integument   Prostate cancer metastatic to bone [facility])    Previously treated in [facility]. He has his initial oncology appointment with Dr. Lorenso Courier on Friday. Plan --Ambulatory referral to palliative care for goals of care and pain management placed.  Will hopefully be able to have this arranged so that he can see the palliative care provider at the cancer center --Ambulatory referral to  Home health ordered      Relevant Medications   oxyCODONE-acetaminophen (PERCOCET) 10-325 MG tablet     Other   Cancer associated pain    This is the patient's primary complaint today.  He is on 10 mg Percocets every 6 hours as needed.  The pain is located in his sacral and coccygeal regions.  He notes that the Percocets do initially help but only lasted about 2 to 3 hours after which time he is in severe pain. He appears in fairly significant pain in the office today. I suspect that the pain is related  to metastatic prostate cancer. 24-hour oxycodone requirement is about 40 to 50 mg. Plan --We will trial an extended release to see if that gives him some better pain management.  OxyContin ER 20 mg every 12 hours sent to pharmacy. --10 mg Percocet every 12 hours as needed for breakthrough pain --We will have him follow-up with [place] in 2 weeks to let us know how this is working.  If this is not working, I would recommend going back to the Percocets but changing to 10 mg every 4 hours.  We could also consider a fentanyl patch for cancer related pain.       Anemia - Primary    Last set of labs from August showed a normocytic borderline microcytic anemia.  Hemoglobin was 11.2 which is a fairly significant drop from 2019 when it was 15. Plan --Iron panel for further evaluation --Further management pending results      Relevant Orders   Iron, TIBC and Ferritin Panel   Healthcare maintenance    Patient agreeable to influenza, pneumonia, and Tdap vaccines today. He has received 1 out of 2 Covid vaccinations.  He does not have his card with him but will seek out the second vaccine soon. Last colonoscopy was reported to be in 2020 per patient's report.  It was normal per his report.       Other Visit Diagnoses    Prostate cancer [facility])       Relevant Orders   Ambulatory referral to [place]   Amb Referral to Palliative Care   Need for immunization against influenza       Relevant Orders    Flu Vaccine QUAD 36+ mos IM (Completed)        Pt discussed with Dr. Adolm Joseph, MD Internal Medicine Resident PGY-2 [facility] Pager: [phone] 11/02/2019 8:06 AM

## 2019-11-01 NOTE — Patient Instructions (Signed)
I have re-ordered home health for you. I have also placed a referral to palliative care. I ordered the long acting oxycodone. Take one tablet every 12 hours. You may use an extra 2 percocets a day for breakthrough pain. Please keep in close contact with our clinic so we can manage you pain best as possible. Please follow up in 2 weeks so we can recheck on the pain.

## 2019-11-02 ENCOUNTER — Telehealth: Payer: Self-pay | Admitting: *Deleted

## 2019-11-02 ENCOUNTER — Telehealth: Payer: Self-pay

## 2019-11-02 DIAGNOSIS — D649 Anemia, unspecified: Secondary | ICD-10-CM | POA: Insufficient documentation

## 2019-11-02 DIAGNOSIS — Z0000 Encounter for general adult medical examination without abnormal findings: Secondary | ICD-10-CM | POA: Insufficient documentation

## 2019-11-02 DIAGNOSIS — G893 Neoplasm related pain (acute) (chronic): Secondary | ICD-10-CM | POA: Insufficient documentation

## 2019-11-02 LAB — IRON,TIBC AND FERRITIN PANEL
Ferritin: 107 ng/mL (ref 30–400)
Iron Saturation: 13 % — ABNORMAL LOW (ref 15–55)
Iron: 40 ug/dL (ref 38–169)
Total Iron Binding Capacity: 307 ug/dL (ref 250–450)
UIBC: 267 ug/dL (ref 111–343)

## 2019-11-02 LAB — LIPID PANEL
Chol/HDL Ratio: 4.5 ratio (ref 0.0–5.0)
Cholesterol, Total: 181 mg/dL (ref 100–199)
HDL: 40 mg/dL (ref >39)
LDL Chol Calc (NIH): 115 mg/dL — ABNORMAL HIGH (ref 0–99)
Triglycerides: 145 mg/dL (ref 0–149)
VLDL Cholesterol Cal: 26 mg/dL (ref 5–40)

## 2019-11-02 MED ORDER — OXYCODONE-ACETAMINOPHEN 10-325 MG PO TABS: 1.0000 | ORAL_TABLET | Freq: Two times a day (BID) | ORAL | 0 refills | Status: DC | PRN

## 2019-11-02 NOTE — Assessment & Plan Note (Addendum)
Lung sounds are diminished on exam however patient denies any change in symptoms at this time. Plan: Continue Trelegy, Symbicort, and albuterol

## 2019-11-02 NOTE — Telephone Encounter (Signed)
Received TC from patient's EC, [name], who states patient was seen by Dr. Darrick Meigs yesterday and was prescribed Oxycodone 20mg  12 hr tablet.  States it is $333 at pharmacy and they cannot afford this.  TC to [place], was informed that the Oxycodone 20mg  12 hr tablet needs a PA. Will send to [facility] and inform MD. Thank you, SChaplin, RN,BSN

## 2019-11-02 NOTE — Assessment & Plan Note (Signed)
Last set of labs from August showed a normocytic borderline microcytic anemia.  Hemoglobin was 11.2 which is a fairly significant drop from 2019 when it was 15. Plan --Iron panel for further evaluation --Further management pending results

## 2019-11-02 NOTE — Assessment & Plan Note (Signed)
Patient agreeable to influenza, pneumonia, and Tdap vaccines today. He has received 1 out of 2 Covid vaccinations.  He does not have his card with him but will seek out the second vaccine soon. Last colonoscopy was reported to be in 2020 per patient's report.  It was normal per his report.

## 2019-11-02 NOTE — Assessment & Plan Note (Addendum)
Previously treated in [place]. He has his initial oncology appointment with Dr. Lorenso Courier on Friday. Plan --Ambulatory referral to palliative care for goals of care and pain management placed.  Will hopefully be able to have this arranged so that he can see the palliative care provider at the cancer center --Ambulatory referral to Home health ordered

## 2019-11-02 NOTE — Progress Notes (Signed)
Internal Medicine Clinic Attending  Case discussed with Dr. Christian  At the time of the visit.  We reviewed the resident's history and exam and pertinent patient test results.  I agree with the assessment, diagnosis, and plan of care documented in the resident's note.  

## 2019-11-02 NOTE — Assessment & Plan Note (Addendum)
This is the patient's primary complaint today.  He is on 10 mg Percocets every 6 hours as needed.  The pain is located in his sacral and coccygeal regions.  He notes that the Percocets do initially help but only lasted about 2 to 3 hours after which time he is in severe pain. He appears in fairly significant pain in the office today. I suspect that the pain is related to metastatic prostate cancer. 24-hour oxycodone requirement is about 40 to 50 mg. Plan --We will trial an extended release to see if that gives him some better pain management.  OxyContin ER 20 mg every 12 hours sent to pharmacy. --10 mg Percocet every 12 hours as needed for breakthrough pain --We will have him follow-up with [place] in 2 weeks to let us know how this is working.  If this is not working, I would recommend going back to the Percocets but changing to 10 mg every 4 hours.  We could also consider a fentanyl patch for cancer related pain.

## 2019-11-02 NOTE — Telephone Encounter (Signed)
CM sent to [facility] with [facility] to see if she can take patient for [facility] PT referral. L. Agustina Witzke, BSN, RN-BC

## 2019-11-02 NOTE — Telephone Encounter (Signed)
[place], Britney  [place], Orvis Brill, RN; [place], [place], [place]  Yes ma'am I can.

## 2019-11-02 NOTE — Assessment & Plan Note (Addendum)
Blood pressure is well controlled in the office today.  On amlodipine 5 mg and hydrochlorothiazide 12.5 mg. Plan:  --Continue current management --Lipid panel for ASCVD risk calculation

## 2019-11-02 NOTE — Telephone Encounter (Addendum)
Information sent through CoverMyMeds for PA Oxycodone HCL ER 20 mg tablets.  Awaiting determination.  Sander Nephew, RN 11/02/2019 4:24 PM. PA for Oxycodone HCL ER 20 mg tablets was approved .  Spoke with Pharmacist. Patient unable to pick up at this time because of controlled medication that was picked up on 10/29/2019.  Sander Nephew, RN 11/03/2019 3:19 PM.

## 2019-11-02 NOTE — Telephone Encounter (Signed)
Thank you.  Dustin Alvarez--please let us know if this doesn't go through so we can switch him back over to percocet

## 2019-11-02 NOTE — Telephone Encounter (Signed)
I spoke with Palliative care of [place],they received our referral and they will be in touch with the patient. The number is [phone] #2 Silverio Decamp C9/21/20213:54 PM

## 2019-11-03 NOTE — Telephone Encounter (Signed)
Can someone give him a call and let him know if not already done?

## 2019-11-03 NOTE — Telephone Encounter (Signed)
Went through.  Was approved.

## 2019-11-04 ENCOUNTER — Telehealth: Payer: Self-pay | Admitting: *Deleted

## 2019-11-04 ENCOUNTER — Encounter: Payer: Medicare Other | Admitting: Internal Medicine

## 2019-11-04 DIAGNOSIS — C7951 Secondary malignant neoplasm of bone: Secondary | ICD-10-CM

## 2019-11-04 DIAGNOSIS — C61 Malignant neoplasm of prostate: Secondary | ICD-10-CM

## 2019-11-04 MED ORDER — OXYCODONE-ACETAMINOPHEN 10-325 MG PO TABS: 1.0000 | ORAL_TABLET | ORAL | 0 refills | Status: DC | PRN

## 2019-11-04 NOTE — Telephone Encounter (Signed)
Patient's son called in stating he cannot afford the oxycodone at $300. Confirmed with Regino Schultze that PA has been approved. Relayed this to son. He also states that he cannot pick this med up until Prescriber calls and speaks to Pharmacist. This is because they had p/u Rx for oxycodone on 10/29/2019 written by different Prescriber. Will forward to Dr. Darrick Meigs to call Pharmacist. Hubbard Hartshorn, BSN, RN-BC

## 2019-11-04 NOTE — Telephone Encounter (Addendum)
Spoke with Pharmacist, Curley Spice, at [facility] with Dr. Darrick Meigs present. Dustin Alvarez states oxycodone 10-325 is on manufacturer's back order. Dr. Darrick Meigs will change Rx and call patient's son. Hubbard Hartshorn, BSN, RN-BC

## 2019-11-04 NOTE — Telephone Encounter (Signed)
Call to patient no answer on 11/03/2019 4:10 PM.

## 2019-11-04 NOTE — Telephone Encounter (Addendum)
Pt's pharmacy is noting that there is a Producer, television/film/video of oxycodone ER and is unsure when it will be available. Plan: will d/c the oxycodone ER and have him go back to taking percocet but will increase the frequency he can take it. Will change script to oxycodone-tylenol 10-325mg  every 4 hours as needed for pain.   [name], MD Internal Medicine Resident PGY-2 [facility] Pager: [phone] 11/04/2019 5:27 PM      Addendum : spoke with patient's daughter who had brought him to his appointment with me. Updated her on the plan. I advised her to call the clinic a few days before her dad runs out of pills as his last refill was for #130 so he will run out prior to the refill date. She expressed great appreciation to our clinic for all we have done so far for her father.

## 2019-11-04 NOTE — Telephone Encounter (Signed)
Thank you very much 

## 2019-11-05 ENCOUNTER — Other Ambulatory Visit: Payer: Self-pay

## 2019-11-05 ENCOUNTER — Inpatient Hospital Stay: Payer: Medicare Other | Attending: Hematology and Oncology | Admitting: Hematology and Oncology

## 2019-11-05 ENCOUNTER — Inpatient Hospital Stay: Payer: Medicare Other

## 2019-11-05 VITALS — BP 133/90 | HR 102 | Temp 97.9°F | Resp 20 | Ht 72.0 in | Wt 138.9 lb

## 2019-11-05 DIAGNOSIS — C61 Malignant neoplasm of prostate: Secondary | ICD-10-CM | POA: Diagnosis present

## 2019-11-05 DIAGNOSIS — C7951 Secondary malignant neoplasm of bone: Secondary | ICD-10-CM

## 2019-11-05 DIAGNOSIS — Z79899 Other long term (current) drug therapy: Secondary | ICD-10-CM | POA: Diagnosis not present

## 2019-11-05 DIAGNOSIS — N529 Male erectile dysfunction, unspecified: Secondary | ICD-10-CM | POA: Diagnosis not present

## 2019-11-05 DIAGNOSIS — F17210 Nicotine dependence, cigarettes, uncomplicated: Secondary | ICD-10-CM | POA: Diagnosis not present

## 2019-11-05 LAB — CBC WITH DIFFERENTIAL (CANCER CENTER ONLY)
Abs Immature Granulocytes: 0.01 10*3/uL (ref 0.00–0.07)
Basophils Absolute: 0 10*3/uL (ref 0.0–0.1)
Basophils Relative: 1 %
Eosinophils Absolute: 0.2 10*3/uL (ref 0.0–0.5)
Eosinophils Relative: 4 %
HCT: 36.2 % — ABNORMAL LOW (ref 39.0–52.0)
Hemoglobin: 12.2 g/dL — ABNORMAL LOW (ref 13.0–17.0)
Immature Granulocytes: 0 %
Lymphocytes Relative: 22 %
Lymphs Abs: 1.4 10*3/uL (ref 0.7–4.0)
MCH: 28.6 pg (ref 26.0–34.0)
MCHC: 33.7 g/dL (ref 30.0–36.0)
MCV: 84.8 fL (ref 80.0–100.0)
Monocytes Absolute: 0.9 10*3/uL (ref 0.1–1.0)
Monocytes Relative: 14 %
Neutro Abs: 3.9 10*3/uL (ref 1.7–7.7)
Neutrophils Relative %: 59 %
Platelet Count: 409 10*3/uL — ABNORMAL HIGH (ref 150–400)
RBC: 4.27 MIL/uL (ref 4.22–5.81)
RDW: 13.6 % (ref 11.5–15.5)
WBC Count: 6.5 10*3/uL (ref 4.0–10.5)
nRBC: 0 % (ref 0.0–0.2)

## 2019-11-05 LAB — CMP (CANCER CENTER ONLY)
ALT: 11 U/L (ref 0–44)
AST: 15 U/L (ref 15–41)
Albumin: 3.9 g/dL (ref 3.5–5.0)
Alkaline Phosphatase: 81 U/L (ref 38–126)
Anion gap: 9 (ref 5–15)
BUN: 11 mg/dL (ref 8–23)
CO2: 28 mmol/L (ref 22–32)
Calcium: 10 mg/dL (ref 8.9–10.3)
Chloride: 98 mmol/L (ref 98–111)
Creatinine: 0.8 mg/dL (ref 0.61–1.24)
GFR, Est AFR Am: >60 mL/min (ref >60)
GFR, Estimated: >60 mL/min (ref >60)
Glucose, Bld: 111 mg/dL — ABNORMAL HIGH (ref 70–99)
Potassium: 4.5 mmol/L (ref 3.5–5.1)
Sodium: 135 mmol/L (ref 135–145)
Total Bilirubin: 0.3 mg/dL (ref 0.3–1.2)
Total Protein: 8 g/dL (ref 6.5–8.1)

## 2019-11-05 LAB — LACTATE DEHYDROGENASE: LDH: 132 U/L (ref 98–192)

## 2019-11-05 NOTE — Progress Notes (Signed)
[place] [phone]   [phone]  INITIAL CONSULT NOTE  Patient Care Team: [name], MD as PCP - General (Internal Medicine)  Hematological/Oncological History # Metastatic Castrate Sensitive Prostate Cancer, Metastatic to Bone 1) 07/13/2019: Abdomen/Pelvis CT extensive lytic changes with superimposed pathological fractures. Lytic change noted in the right iliac bone and new lucencies in the right T10 vertebrae.  2) 07/19/2019: biopsy of sacral mass shows metastatic prostatic adenocarcinoma, Gleason 4+4.  3) 07/2019: reportedly received Zometa 4g IV and eligard 22.5. Started on Casodex.  4) Moved to [place]. Lost to follow up from [facility] in [place], [place]. 5) 11/05/2019: establish care with Dr. Lorenso Courier   CHIEF COMPLAINTS/PURPOSE OF CONSULTATION:  "Metastatic Prostate Cancer "  HISTORY OF PRESENTING ILLNESS:  Dustin Alvarez 64 y.o. male with medical history significant for asthma, COPD, and HTN who presents to establish care for recently diagnosed metastatic prostate cancer.   On review of the previous records [name] underwent CT scan on 07/13/2019 at which time he was found to have extensive lytic changes with superimposed pathological fractures.  He was noted to have lytic changes of the right iliac bones and new lucencies in the right T10 vertebra.  On 07/19/2019 he had a biopsy performed of the sacral mass which showed metastatic prostatic carcinoma, Gleason 4+4.  During this month he received Zometa 4 g IV and Eligard 22.5 mg.  He was also started on Casodex at that time, which he is continuing as of now.  Unfortunately the patient had moved from [facility] to [place] and was lost to follow-up.  He presents today to establish care with an oncological practice.  On exam today [name] presents with his daughter.  He reports that his symptoms began approximately 6 months to a year ago when his prostate "started acting  up" he went to [employer] and purchased beta prostate pills which he took for approximately 6 months to 1 year.  He notes that eventually he began developing pain in the muscles in the back of his legs bilaterally and was 8 out of 10 in severity.  That is when he sought medical attention which prompted the imaging which showed his metastatic prostate cancer.  On further discussion Mr. Mousel notes that he has lost 2025 pounds over the last month.  He notes that his pain remains about an 8 out of 10 in severity and he has been taking Percocets in order to try to help with this.  He also notes he has been having difficulty with erectile dysfunction and that is causing his "girlfriend" much distress.  He notes that his oldest sister's oldest son also was recently diagnosed with prostate cancer.  There is no other oncological history of in the family.  The patient is active smoker and smokes about 1 pack/day and has been doing so for 35 to 40 years.  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  He currently denies having issues with fevers, chills, sweats, nausea, vomiting or diarrhea.  A full 10 point ROS is listed below.  MEDICAL HISTORY:  Past Medical History:  Diagnosis Date  . Asthma   . COPD (chronic obstructive pulmonary disease) ([place])   . Hypertension     SURGICAL HISTORY: Past Surgical History:  Procedure Laterality Date  . WRIST SURGERY Left 2017    SOCIAL HISTORY: Social History   Socioeconomic History  . Marital status: Legally Separated    Spouse name: Not on file  .  Number of children: Not on file  . Years of education: Not on file  . Highest education level: Not on file  Occupational History  . Not on file  Tobacco Use  . Smoking status: Current Every Day Smoker    Packs/day: 1.00  . Smokeless tobacco: Never Used  . Tobacco comment: almost 1 pkd  Substance and Sexual Activity  . Alcohol use: Yes    Comment: 1-2 drinks per week.  . Drug use: Never  . Sexual  activity: Not on file  Other Topics Concern  . Not on file  Social History Narrative  . Not on file   Social Determinants of Health   Financial Resource Strain:   . Difficulty of Paying Living Expenses: Not on file  Food Insecurity:   . Worried About Charity fundraiser in the Last Year: Not on file  . Ran Out of Food in the Last Year: Not on file  Transportation Needs:   . Lack of Transportation (Medical): Not on file  . Lack of Transportation (Non-Medical): Not on file  Physical Activity:   . Days of Exercise per Week: Not on file  . Minutes of Exercise per Session: Not on file  Stress:   . Feeling of Stress : Not on file  Social Connections:   . Frequency of Communication with Friends and Family: Not on file  . Frequency of Social Gatherings with Friends and Family: Not on file  . Attends Religious Services: Not on file  . Active Member of Clubs or Organizations: Not on file  . Attends Archivist Meetings: Not on file  . Marital Status: Not on file  Intimate Partner Violence:   . Fear of Current or Ex-Partner: Not on file  . Emotionally Abused: Not on file  . Physically Abused: Not on file  . Sexually Abused: Not on file    FAMILY HISTORY: Family History  Problem Relation Age of Onset  . COPD Father   . Diabetes Sister   . Diabetes Brother     ALLERGIES:  is allergic to ace inhibitors.  MEDICATIONS:  Current Outpatient Medications  Medication Sig Dispense Refill  . albuterol (VENTOLIN HFA) 108 (90 Base) MCG/ACT inhaler Inhale 2 puffs into the lungs every 6 (six) hours as needed for wheezing or shortness of breath. 8 g 3  . amLODipine (NORVASC) 5 MG tablet Take 1 tablet (5 mg total) by mouth daily. 90 tablet 3  . bicalutamide (CASODEX) 50 MG tablet Take 1 tablet (50 mg total) by mouth daily. 30 tablet 0  . budesonide-formoterol (SYMBICORT) 160-4.5 MCG/ACT inhaler Inhale 2 puffs into the lungs 2 (two) times daily.    . finasteride (PROSCAR) 5 MG tablet  Take 1 tablet (5 mg total) by mouth daily. 30 tablet 2  . Fluticasone-Umeclidin-Vilant (TRELEGY ELLIPTA) 100-62.5-25 MCG/INH AEPB Inhale 2 puffs into the lungs daily. 180 each 0  . gabapentin (NEURONTIN) 300 MG capsule Take 1 capsule (300 mg total) by mouth at bedtime. 30 capsule 2  . hydrochlorothiazide (MICROZIDE) 12.5 MG capsule Take 1 capsule (12.5 mg total) by mouth daily. 90 capsule 1  . oxyCODONE-acetaminophen (PERCOCET) 10-325 MG tablet Take 1 tablet by mouth every 4 (four) hours as needed for pain (for breakthrough pain). 180 tablet 0  . pantoprazole (PROTONIX) 40 MG tablet Take 1 tablet (40 mg total) by mouth daily. 90 tablet 3  . sildenafil (VIAGRA) 50 MG tablet Take 1 tablet (50 mg total) by mouth daily as needed for erectile  dysfunction. 10 tablet 0  . tamsulosin (FLOMAX) 0.4 MG CAPS capsule Take 1 capsule (0.4 mg total) by mouth in the morning and at bedtime. 60 capsule 0  . traZODone (DESYREL) 50 MG tablet Take 1 tablet (50 mg total) by mouth at bedtime. 30 tablet 0   No current facility-administered medications for this visit.    REVIEW OF SYSTEMS:   Constitutional: ( - ) fevers, ( - )  chills , ( - ) night sweats Eyes: ( - ) blurriness of vision, ( - ) double vision, ( - ) watery eyes Ears, nose, mouth, throat, and face: ( - ) mucositis, ( - ) sore throat Respiratory: ( - ) cough, ( - ) dyspnea, ( - ) wheezes Cardiovascular: ( - ) palpitation, ( - ) chest discomfort, ( - ) lower extremity swelling Gastrointestinal:  ( - ) nausea, ( - ) heartburn, ( - ) change in bowel habits Skin: ( - ) abnormal skin rashes Lymphatics: ( - ) new lymphadenopathy, ( - ) easy bruising Neurological: ( - ) numbness, ( - ) tingling, ( - ) new weaknesses Behavioral/Psych: ( - ) mood change, ( - ) new changes  All other systems were reviewed with the patient and are negative.  PHYSICAL EXAMINATION: ECOG PERFORMANCE STATUS: 1 - Symptomatic but completely ambulatory  Vitals:   11/05/19 1358  BP:  133/90  Pulse: (!) 102  Resp: 20  Temp: 97.9 F (36.6 C)  SpO2: 100%   Filed Weights   11/05/19 1358  Weight: 138 lb 14.4 oz (63 kg)    GENERAL: well appearing elderly African American male in NAD  SKIN: skin color, texture, turgor are normal, no rashes or significant lesions EYES: conjunctiva are pink and non-injected, sclera clear LUNGS: clear to auscultation and percussion with normal breathing effort HEART: regular rate & rhythm and no murmurs and no lower extremity edema ABDOMEN: soft, non-tender, non-distended, normal bowel sounds Musculoskeletal: no cyanosis of digits and no clubbing  PSYCH: alert & oriented x 3, fluent speech NEURO: no focal motor/sensory deficits  LABORATORY DATA:  I have reviewed the data as listed CBC Latest Ref Rng & Units 11/05/2019 09/30/2019 01/23/2018  WBC 4.0 - 10.5 K/uL 6.5 5.5 4.6  Hemoglobin 13.0 - 17.0 g/dL 12.2(L) 11.2(L) 14.8  Hematocrit 39 - 52 % 36.2(L) 34.0(L) 45.7  Platelets 150 - 400 K/uL 409(H) 506(H) 220    CMP Latest Ref Rng & Units 11/05/2019 09/30/2019 01/23/2018  Glucose 70 - 99 mg/dL 111(H) 129(H) 117(H)  BUN 8 - 23 mg/dL 11 15 10   Creatinine 0.61 - 1.24 mg/dL 0.80 0.94 0.98  Sodium 135 - 145 mmol/L 135 135 140  Potassium 3.5 - 5.1 mmol/L 4.5 4.3 4.0  Chloride 98 - 111 mmol/L 98 98 105  CO2 22 - 32 mmol/L 28 27 23   Calcium 8.9 - 10.3 mg/dL 10.0 9.5 8.9  Total Protein 6.5 - 8.1 g/dL 8.0 - -  Total Bilirubin 0.3 - 1.2 mg/dL 0.3 - -  Alkaline Phos 38 - 126 U/L 81 - -  AST 15 - 41 U/L 15 - -  ALT 0 - 44 U/L 11 - -   PATHOLOGY: Noted to be Gleason 4+4 from outside reports.   RADIOGRAPHIC STUDIES: No results found.  ASSESSMENT & PLAN RODDRICK SHARRON 64 y.o. male with medical history significant for asthma, COPD, and HTN who presents to establish care for recently diagnosed metastatic prostate cancer.  Review the labs, review the records, discussion with the  patient the findings most consistent with metastatic prostate  cancer.  As such the treatment of choice would be to proceed with androgen deprivation therapy as well as the addition of abiraterone.  The patient is currently on Casodex, but unfortunately is long overdue for his Lupron shot.  We will arrange for him to receive his Zometa and Lupron as early as next week and have him return to discuss discontinuation of Casodex and starting abiraterone therapy.  PSA 07/13/2019: PSA 819 08/27/2019: 226 11/05/2019: 44.8  # Metastatic Castrate Sensitive Prostate Cancer, Metastatic to Bone --findings are most consistent with metastatic adenocarcinoma of the prostate.  --testosterone today is <3, PSA down to 44.8 ( from 819 at diagnosis)  --He is currently taking Casodex 50mg  PO daily. --patient is currently due for Zometa 4mg  IV and Lupron 22.5 mg --recommend starting patient on abiraterone therapy once he has been restarted on lupron and has d/c casodex.  --RTC in 3 weeks s/p lupron shot for discussion of starting abiraterone.   #Symptom Management --patient requested Viagra due to issues with ED. Will prescribe this for him today with caution about hypotension/syncope --gabapentin 300 mg nightly to help with pain.  --continue percocet 10-325 q4H PRN for pain control. Will transition to oxycodone once he completes this supply.   Orders Placed This Encounter  Procedures  . CBC with Differential (Cancer Center Only)    Standing Status:   Future    Number of Occurrences:   1    Standing Expiration Date:   11/04/2020  . CMP ([facility] only)    Standing Status:   Future    Number of Occurrences:   1    Standing Expiration Date:   11/04/2020  . Lactate dehydrogenase (LDH)    Standing Status:   Future    Number of Occurrences:   1    Standing Expiration Date:   11/04/2020  . Testosterone    Standing Status:   Future    Number of Occurrences:   1    Standing Expiration Date:   11/04/2020  . Prostate-Specific AG, Serum    Standing Status:   Future    Number  of Occurrences:   1    Standing Expiration Date:   11/04/2020    All questions were answered. The patient knows to call the clinic with any problems, questions or concerns.  A total of more than 60 minutes were spent on this encounter and over half of that time was spent on counseling and coordination of care as outlined above.   Ledell Peoples, MD Department of Hematology/Oncology [place] at [facility] Phone: [phone] Pager: [phone] Email: [email or web address]  11/09/2019 6:35 PM

## 2019-11-06 LAB — PROSTATE-SPECIFIC AG, SERUM (LABCORP): Prostate Specific Ag, Serum: 44.8 ng/mL — ABNORMAL HIGH (ref 0.0–4.0)

## 2019-11-06 LAB — TESTOSTERONE: Testosterone: <3 ng/dL — ABNORMAL LOW (ref 264–916)

## 2019-11-08 NOTE — Telephone Encounter (Signed)
Dustin Alvarez  [place], [place], RN  Yes ma'am 11/03/2019  [facility])   ----- Message -----  From: Velora Heckler, RN  Sent: 11/08/2019  8:39 AM EDT  To: [name]  Subject: RE: [facility] PT                     Thank you! Can you let me know the date?   ----- Message -----  From: Dustin Alvarez  Sent: 11/05/2019 10:49 AM EDT  To: [name], NT, Velora Heckler, RN  Subject: RE: [facility] PT                     Patient was admitted to services.

## 2019-11-09 ENCOUNTER — Encounter: Payer: Self-pay | Admitting: Hematology and Oncology

## 2019-11-09 MED ORDER — GABAPENTIN 300 MG PO CAPS: 300.0000 mg | ORAL_CAPSULE | Freq: Every day | ORAL | 2 refills | Status: DC

## 2019-11-09 MED ORDER — SILDENAFIL CITRATE 50 MG PO TABS: 50.0000 mg | ORAL_TABLET | Freq: Every day | ORAL | 0 refills | Status: DC | PRN

## 2019-11-10 ENCOUNTER — Telehealth: Payer: Self-pay | Admitting: *Deleted

## 2019-11-10 NOTE — Telephone Encounter (Signed)
Received call from pt's daughter, Ms. Baltazar Najjar. She is asking if pt's Viagra has been called in to his pharmacy and also that the order for Oxycodone ER has been approved.  She states that it is not available at [facility].   Checked with [facility] and it is available there.  Pt is currently on Oxycodone/APAP 10/325, 1 tablet every 4 hours.  She states he does take it every four hours, except for when he asleep at night- he might not take it consistently then. Informed her that I would let Dr. Lorenso Courier know about pt request for Oxycontin.  Also advised that the viagra was sent to [facility] last evening.  She voiced understanding

## 2019-11-12 ENCOUNTER — Telehealth: Payer: Self-pay | Admitting: Hematology and Oncology

## 2019-11-12 NOTE — Telephone Encounter (Signed)
Scheduled apt per 9/28 sch msg - pt aware of appt date and time

## 2019-11-16 ENCOUNTER — Other Ambulatory Visit: Payer: Self-pay

## 2019-11-16 ENCOUNTER — Inpatient Hospital Stay: Payer: Medicare Other | Attending: Hematology and Oncology

## 2019-11-16 ENCOUNTER — Other Ambulatory Visit: Payer: Self-pay | Admitting: Emergency Medicine

## 2019-11-16 ENCOUNTER — Inpatient Hospital Stay: Payer: Medicare Other

## 2019-11-16 VITALS — BP 122/79 | HR 89 | Temp 98.0°F | Resp 18

## 2019-11-16 DIAGNOSIS — Z79899 Other long term (current) drug therapy: Secondary | ICD-10-CM | POA: Insufficient documentation

## 2019-11-16 DIAGNOSIS — C7951 Secondary malignant neoplasm of bone: Secondary | ICD-10-CM | POA: Diagnosis present

## 2019-11-16 DIAGNOSIS — C61 Malignant neoplasm of prostate: Secondary | ICD-10-CM

## 2019-11-16 DIAGNOSIS — N529 Male erectile dysfunction, unspecified: Secondary | ICD-10-CM | POA: Diagnosis not present

## 2019-11-16 DIAGNOSIS — F17210 Nicotine dependence, cigarettes, uncomplicated: Secondary | ICD-10-CM | POA: Insufficient documentation

## 2019-11-16 LAB — CBC WITH DIFFERENTIAL (CANCER CENTER ONLY)
Abs Immature Granulocytes: 0.01 10*3/uL (ref 0.00–0.07)
Basophils Absolute: 0 10*3/uL (ref 0.0–0.1)
Basophils Relative: 1 %
Eosinophils Absolute: 0.3 10*3/uL (ref 0.0–0.5)
Eosinophils Relative: 5 %
HCT: 35 % — ABNORMAL LOW (ref 39.0–52.0)
Hemoglobin: 11.9 g/dL — ABNORMAL LOW (ref 13.0–17.0)
Immature Granulocytes: 0 %
Lymphocytes Relative: 33 %
Lymphs Abs: 1.6 10*3/uL (ref 0.7–4.0)
MCH: 28.5 pg (ref 26.0–34.0)
MCHC: 34 g/dL (ref 30.0–36.0)
MCV: 83.7 fL (ref 80.0–100.0)
Monocytes Absolute: 0.5 10*3/uL (ref 0.1–1.0)
Monocytes Relative: 11 %
Neutro Abs: 2.4 10*3/uL (ref 1.7–7.7)
Neutrophils Relative %: 50 %
Platelet Count: 388 10*3/uL (ref 150–400)
RBC: 4.18 MIL/uL — ABNORMAL LOW (ref 4.22–5.81)
RDW: 13 % (ref 11.5–15.5)
WBC Count: 4.9 10*3/uL (ref 4.0–10.5)
nRBC: 0 % (ref 0.0–0.2)

## 2019-11-16 LAB — CMP (CANCER CENTER ONLY)
ALT: 10 U/L (ref 0–44)
AST: 15 U/L (ref 15–41)
Albumin: 3.8 g/dL (ref 3.5–5.0)
Alkaline Phosphatase: 72 U/L (ref 38–126)
Anion gap: 8 (ref 5–15)
BUN: 12 mg/dL (ref 8–23)
CO2: 28 mmol/L (ref 22–32)
Calcium: 9.6 mg/dL (ref 8.9–10.3)
Chloride: 102 mmol/L (ref 98–111)
Creatinine: 0.85 mg/dL (ref 0.61–1.24)
GFR, Estimated: >60 mL/min (ref >60)
Glucose, Bld: 112 mg/dL — ABNORMAL HIGH (ref 70–99)
Potassium: 3.5 mmol/L (ref 3.5–5.1)
Sodium: 138 mmol/L (ref 135–145)
Total Bilirubin: 0.3 mg/dL (ref 0.3–1.2)
Total Protein: 7.6 g/dL (ref 6.5–8.1)

## 2019-11-16 MED ORDER — ZOLEDRONIC ACID 4 MG/100ML IV SOLN
4.0000 mg | Freq: Once | INTRAVENOUS | Status: AC
  Administered 2019-11-16: 4 mg via INTRAVENOUS

## 2019-11-16 MED ORDER — ZOLEDRONIC ACID 4 MG/100ML IV SOLN
INTRAVENOUS | Status: AC
  Filled 2019-11-16: qty 100

## 2019-11-16 MED ORDER — LEUPROLIDE ACETATE (3 MONTH) 22.5 MG ~~LOC~~ KIT
PACK | SUBCUTANEOUS | Status: AC
  Filled 2019-11-16: qty 22.5

## 2019-11-16 MED ORDER — SODIUM CHLORIDE 0.9 % IV SOLN
INTRAVENOUS | Status: DC
  Filled 2019-11-16: qty 250

## 2019-11-16 MED ORDER — LEUPROLIDE ACETATE (3 MONTH) 22.5 MG ~~LOC~~ KIT
22.5000 mg | PACK | Freq: Once | SUBCUTANEOUS | Status: AC
  Administered 2019-11-16: 22.5 mg via SUBCUTANEOUS

## 2019-11-16 NOTE — Patient Instructions (Signed)
Leuprolide injection What is this medicine? LEUPROLIDE (loo PROE lide) is a man-made hormone. It is used to treat the symptoms of prostate cancer. This medicine may also be used to treat children with early onset of puberty. It may be used for other hormonal conditions. This medicine may be used for other purposes; ask your health care provider or pharmacist if you have questions. COMMON BRAND NAME(S): Lupron What should I tell my health care provider before I take this medicine? They need to know if you have any of these conditions:  diabetes  heart disease or previous heart attack  high blood pressure  high cholesterol  pain or difficulty passing urine  spinal cord metastasis  stroke  tobacco smoker  an unusual or allergic reaction to leuprolide, benzyl alcohol, other medicines, foods, dyes, or preservatives  pregnant or trying to get pregnant  breast-feeding How should I use this medicine? This medicine is for injection under the skin or into a muscle. You will be taught how to prepare and give this medicine. Use exactly as directed. Take your medicine at regular intervals. Do not take your medicine more often than directed. It is important that you put your used needles and syringes in a special sharps container. Do not put them in a trash can. If you do not have a sharps container, call your pharmacist or healthcare provider to get one. A special MedGuide will be given to you by the pharmacist with each prescription and refill. Be sure to read this information carefully each time. Talk to your pediatrician regarding the use of this medicine in children. While this medicine may be prescribed for children as young as 8 years for selected conditions, precautions do apply. Overdosage: If you think you have taken too much of this medicine contact a poison control center or emergency room at once. NOTE: This medicine is only for you. Do not share this medicine with others. What if  I miss a dose? If you miss a dose, take it as soon as you can. If it is almost time for your next dose, take only that dose. Do not take double or extra doses. What may interact with this medicine? Do not take this medicine with any of the following medications:  chasteberry This medicine may also interact with the following medications:  herbal or dietary supplements, like black cohosh or DHEA  male hormones, like estrogens or progestins and birth control pills, patches, rings, or injections  male hormones, like testosterone This list may not describe all possible interactions. Give your health care provider a list of all the medicines, herbs, non-prescription drugs, or dietary supplements you use. Also tell them if you smoke, drink alcohol, or use illegal drugs. Some items may interact with your medicine. What should I watch for while using this medicine? Visit your doctor or health care professional for regular checks on your progress. During the first week, your symptoms may get worse, but then will improve as you continue your treatment. You may get hot flashes, increased bone pain, increased difficulty passing urine, or an aggravation of nerve symptoms. Discuss these effects with your doctor or health care professional, some of them may improve with continued use of this medicine. Male patients may experience a menstrual cycle or spotting during the first 2 months of therapy with this medicine. If this continues, contact your doctor or health care professional. This medicine may increase blood sugar. Ask your healthcare provider if changes in diet or medicines are needed if   you have diabetes. What side effects may I notice from receiving this medicine? Side effects that you should report to your doctor or health care professional as soon as possible:  allergic reactions like skin rash, itching or hives, swelling of the face, lips, or tongue  breathing problems  chest  pain  depression or memory disorders  pain in your legs or groin  pain at site where injected  severe headache  signs and symptoms of high blood sugar such as being more thirsty or hungry or having to urinate more than normal. You may also feel very tired or have blurry vision  swelling of the feet and legs  visual changes  vomiting Side effects that usually do not require medical attention (report to your doctor or health care professional if they continue or are bothersome):  breast swelling or tenderness  decrease in sex drive or performance  diarrhea  hot flashes  loss of appetite  muscle, joint, or bone pains  nausea  redness or irritation at site where injected  skin problems or acne This list may not describe all possible side effects. Call your doctor for medical advice about side effects. You may report side effects to FDA at 1-800-FDA-1088. Where should I keep my medicine? Keep out of the reach of children. Store below 25 degrees C (77 degrees F). Do not freeze. Protect from light. Do not use if it is not clear or if there are particles present. Throw away any unused medicine after the expiration date. NOTE: This sheet is a summary. It may not cover all possible information. If you have questions about this medicine, talk to your doctor, pharmacist, or health care provider.  2020 Elsevier/Gold Standard (2017-11-27 09:52:48) Zoledronic Acid injection (Hypercalcemia, Oncology) What is this medicine? ZOLEDRONIC ACID (ZOE le dron ik AS id) lowers the amount of calcium loss from bone. It is used to treat too much calcium in your blood from cancer. It is also used to prevent complications of cancer that has spread to the bone. This medicine may be used for other purposes; ask your health care provider or pharmacist if you have questions. COMMON BRAND NAME(S): Zometa What should I tell my health care provider before I take this medicine? They need to know if you  have any of these conditions:  aspirin-sensitive asthma  cancer, especially if you are receiving medicines used to treat cancer  dental disease or wear dentures  infection  kidney disease  receiving corticosteroids like dexamethasone or prednisone  an unusual or allergic reaction to zoledronic acid, other medicines, foods, dyes, or preservatives  pregnant or trying to get pregnant  breast-feeding How should I use this medicine? This medicine is for infusion into a vein. It is given by a health care professional in a hospital or clinic setting. Talk to your pediatrician regarding the use of this medicine in children. Special care may be needed. Overdosage: If you think you have taken too much of this medicine contact a poison control center or emergency room at once. NOTE: This medicine is only for you. Do not share this medicine with others. What if I miss a dose? It is important not to miss your dose. Call your doctor or health care professional if you are unable to keep an appointment. What may interact with this medicine?  certain antibiotics given by injection  NSAIDs, medicines for pain and inflammation, like ibuprofen or naproxen  some diuretics like bumetanide, furosemide  teriparatide  thalidomide This list may not  describe all possible interactions. Give your health care provider a list of all the medicines, herbs, non-prescription drugs, or dietary supplements you use. Also tell them if you smoke, drink alcohol, or use illegal drugs. Some items may interact with your medicine. What should I watch for while using this medicine? Visit your doctor or health care professional for regular checkups. It may be some time before you see the benefit from this medicine. Do not stop taking your medicine unless your doctor tells you to. Your doctor may order blood tests or other tests to see how you are doing. Women should inform their doctor if they wish to become pregnant or  think they might be pregnant. There is a potential for serious side effects to an unborn child. Talk to your health care professional or pharmacist for more information. You should make sure that you get enough calcium and vitamin D while you are taking this medicine. Discuss the foods you eat and the vitamins you take with your health care professional. Some people who take this medicine have severe bone, joint, and/or muscle pain. This medicine may also increase your risk for jaw problems or a broken thigh bone. Tell your doctor right away if you have severe pain in your jaw, bones, joints, or muscles. Tell your doctor if you have any pain that does not go away or that gets worse. Tell your dentist and dental surgeon that you are taking this medicine. You should not have major dental surgery while on this medicine. See your dentist to have a dental exam and fix any dental problems before starting this medicine. Take good care of your teeth while on this medicine. Make sure you see your dentist for regular follow-up appointments. What side effects may I notice from receiving this medicine? Side effects that you should report to your doctor or health care professional as soon as possible:  allergic reactions like skin rash, itching or hives, swelling of the face, lips, or tongue  anxiety, confusion, or depression  breathing problems  changes in vision  eye pain  feeling faint or lightheaded, falls  jaw pain, especially after dental work  mouth sores  muscle cramps, stiffness, or weakness  redness, blistering, peeling or loosening of the skin, including inside the mouth  trouble passing urine or change in the amount of urine Side effects that usually do not require medical attention (report to your doctor or health care professional if they continue or are bothersome):  bone, joint, or muscle pain  constipation  diarrhea  fever  hair loss  irritation at site where  injected  loss of appetite  nausea, vomiting  stomach upset  trouble sleeping  trouble swallowing  weak or tired This list may not describe all possible side effects. Call your doctor for medical advice about side effects. You may report side effects to FDA at 1-800-FDA-1088. Where should I keep my medicine? This drug is given in a hospital or clinic and will not be stored at home. NOTE: This sheet is a summary. It may not cover all possible information. If you have questions about this medicine, talk to your doctor, pharmacist, or health care provider.  2020 Elsevier/Gold Standard (2013-06-26 14:19:39)

## 2019-11-17 ENCOUNTER — Telehealth: Payer: Self-pay | Admitting: *Deleted

## 2019-11-17 ENCOUNTER — Telehealth: Payer: Self-pay | Admitting: Internal Medicine

## 2019-11-17 ENCOUNTER — Telehealth: Payer: Self-pay | Admitting: Hospice

## 2019-11-17 NOTE — Telephone Encounter (Signed)
Spoke with patient regarding Palliative referral/services and all questions were answered and he was in agreement with scheduling visit.  I have scheduled an In-person Consult for 11/25/19 @ 1 PM.

## 2019-11-17 NOTE — Telephone Encounter (Signed)
   Reason for call:   I received a call from Mr. Dustin Alvarez at 6 PM, attempted to return call with no answer x2.    [name], MD   11/17/2019, 7:17 PM

## 2019-11-17 NOTE — Telephone Encounter (Signed)
Received call from pt's daughter. She is requesting refill on patient's Percocet. She states it needs to be filled before the weekend.  She asked for refill of his finesteride. Advised to call the prescribing PCP for refills of this.  Daughter voiced understanding.

## 2019-11-22 ENCOUNTER — Other Ambulatory Visit: Payer: Self-pay | Admitting: Hematology and Oncology

## 2019-11-22 ENCOUNTER — Other Ambulatory Visit: Payer: Self-pay

## 2019-11-22 DIAGNOSIS — C61 Malignant neoplasm of prostate: Secondary | ICD-10-CM

## 2019-11-22 DIAGNOSIS — C7951 Secondary malignant neoplasm of bone: Secondary | ICD-10-CM

## 2019-11-22 MED ORDER — FINASTERIDE 5 MG PO TABS: 5.0000 mg | ORAL_TABLET | Freq: Every day | ORAL | 2 refills | Status: DC

## 2019-11-22 MED ORDER — OXYCODONE HCL 5 MG PO TABS: 10.0000 mg | ORAL_TABLET | ORAL | 0 refills | Status: DC | PRN

## 2019-11-22 NOTE — Telephone Encounter (Signed)
finasteride (PROSCAR) 5 MG tablet, REFILL REQUEST @  [place] ([place]), [place] - [address] Phone:  [phone]  Fax:  [phone]

## 2019-11-23 ENCOUNTER — Other Ambulatory Visit: Payer: Self-pay | Admitting: Hematology and Oncology

## 2019-11-23 DIAGNOSIS — C61 Malignant neoplasm of prostate: Secondary | ICD-10-CM

## 2019-11-23 DIAGNOSIS — C7951 Secondary malignant neoplasm of bone: Secondary | ICD-10-CM

## 2019-11-23 MED ORDER — OXYCODONE HCL 5 MG PO TABS: 5.0000 mg | ORAL_TABLET | ORAL | 0 refills | Status: DC | PRN

## 2019-11-23 NOTE — Progress Notes (Signed)
CT

## 2019-11-25 ENCOUNTER — Other Ambulatory Visit: Payer: Self-pay | Admitting: Internal Medicine

## 2019-11-25 ENCOUNTER — Other Ambulatory Visit: Payer: Medicare Other | Admitting: Hospice

## 2019-11-25 ENCOUNTER — Other Ambulatory Visit: Payer: Self-pay

## 2019-11-25 DIAGNOSIS — C7951 Secondary malignant neoplasm of bone: Secondary | ICD-10-CM

## 2019-11-25 DIAGNOSIS — C61 Malignant neoplasm of prostate: Secondary | ICD-10-CM

## 2019-11-25 DIAGNOSIS — Z515 Encounter for palliative care: Secondary | ICD-10-CM

## 2019-11-25 DIAGNOSIS — J42 Unspecified chronic bronchitis: Secondary | ICD-10-CM

## 2019-11-25 MED ORDER — TAMSULOSIN HCL 0.4 MG PO CAPS
0.4000 mg | ORAL_CAPSULE | Freq: Two times a day (BID) | ORAL | 0 refills | Status: DC
Start: 2019-11-25 — End: 2019-12-03

## 2019-11-25 MED ORDER — TRAZODONE HCL 50 MG PO TABS
50.0000 mg | ORAL_TABLET | Freq: Every day | ORAL | 0 refills | Status: DC
Start: 2019-11-25 — End: 2020-01-07

## 2019-11-25 MED ORDER — BICALUTAMIDE 50 MG PO TABS: 50.0000 mg | ORAL_TABLET | Freq: Every day | ORAL | 0 refills | Status: DC

## 2019-11-25 NOTE — Progress Notes (Signed)
[facility] Consult Note Telephone: [phone]  Fax: [phone]  PATIENT NAME: Dustin Alvarez [id number] [phone] (home)  DOB: 02-01-1956 MRN: [id number]  PRIMARY CARE PROVIDER:    [name], MD,  [address] [place] [address] [phone]  REFERRING PROVIDER:   [name], MD [address] [place],  [place] [address] [phone]  RESPONSIBLE PARTY:   Self [phone] Emergency contact: Dustin Alvarez - son [phone] home phone  I met face to face with patient and family in home/facility.  RECOMMENDATIONS/PLAN:    Visit at the request of Dr. Mitzi Alvarez  for palliative consult. Visit consisted of building trust and discussions on Palliative Medicine as specialized medical care for people living with serious illness, aimed at facilitating better quality of life through symptoms relief, assisting with advance care plan and establishing goals of care. [name] - patient's son- was present at visit.  Discussion on the difference between Palliative and Hospice care. Palliative care and hospice have similar goals of managing symptoms, promoting Dustin, improving quality of life, and maintaining a person's dignity. However, palliative care may be offered during any phase of a serious illness, while hospice care is usually offered when a person is expected to live for 6 months or less. Visit consisted of counseling and education dealing with the complex and emotionally intense issues of symptom management and palliative care in the setting of serious and potentially life-threatening illness. Palliative care team will continue to support patient, patient's family, and medical team.  Advance Care Planning: Our advance care planning conversation included a discussion about:    The value and importance of advance care planning  Experiences with loved ones who  have been seriously ill or have died  Exploration of personal, cultural or spiritual beliefs that might influence medical decisions  Exploration of goals of care in the event of a sudden injury or illness  Identification and preparation of a healthcare agent  Review and updating or creation of an  advance directive document  CODE STATUS: Extensive discussion on implications and ramifications of CODE STATUS patient elected to be a DO NOT RESUSCITATE.  NP signed a DNR form for patient; same document uploaded to epic today.  Dustin Alvarez was in agreement.  GOALS OF CARE: Goals of care include to maximize quality of life and symptom management. Patient said that down the line he would be interested in hospice service when it is appropriate.  He mentioned that his sister died with palliative and hospice service.  Therapeutic listening and ample emotional support provided.  Follow up Palliative Care Visit: Palliative care will continue to follow for goals of care clarification and symptom management. MOST form left for patient's consideration preparatory to next visit in a month for goals of care clarification.  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  Chart review indicates patient received Lupron 11/16/2019; no report of adverse reaction.  Patient in good spirit; endorsed some weakness with moderate exertion.  Physical therapy is ongoing for strengthening.  Patient is ambulatory; Uses cane and rolling walker.  He is fairly independent in his activities of daily living. He has appointment with Dr. Wayland Alvarez 12/08/2019; CT scan scheduled for 12/02/2019 Patient with history of COPD; no recent exacerbation.  He has his inhalers and is compliant with his medications. He continues to smoke half a pack a day; education on smoking  cessation. Patient said he will try to cut back.  [place] manages his medication with a pill  box.  Patient in no medical acuity with no complaints at this time.  Encouraged ongoing care. Palliative will continue to monitor for symptom management/decline and make recommendations as needed.   Family /Caregiver/Community Supports: Patient lives at home with his son Dustin Alvarez, involved in his care.  Patient recently moved to [place] from [place].  Spirituality and faith in God keep him going and hopeful  I spent 1 hour and 46 minutes providing this initial consultation; time includes time spent with patient/family, chart review, provider coordination,  and documentation. More than 50% of the time in this consultation was spent on coordinating communication  CHIEF COMPLAIN/HISTORY OF PRESENT ILLNESS:  Dustin Alvarez is a 64 y.o. 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 Palliative Care was asked to follow this patient by consultation request of Dustin Hansen, MD to help address advance care planning and goals of care.  CODE STATUS: DNR  PPS: 60%  HOSPICE ELIGIBILITY/DIAGNOSIS: TBD  PAST MEDICAL HISTORY:  Past Medical History:  Diagnosis Date  . Asthma   . COPD (chronic obstructive pulmonary disease) ([place])   . Hypertension     SOCIAL HX:  Social History   Tobacco Use  . Smoking status: Current Every Day Smoker    Packs/day: 1.00  . Smokeless tobacco: Never Used  . Tobacco comment: almost 1 pkd  Substance Use Topics  . Alcohol use: Yes    Comment: 1-2 drinks per week.   FAMILY HX:  Family History  Problem Relation Age of Onset  . COPD Father   . Diabetes Sister   . Diabetes Brother     ALLERGIES:  Allergies  Allergen Reactions  . Ace Inhibitors Swelling    lisinopril     PERTINENT MEDICATIONS:  Outpatient Encounter Medications as of 11/25/2019  Medication Sig  . albuterol (VENTOLIN HFA) 108 (90 Base) MCG/ACT inhaler Inhale 2 puffs into the lungs every 6 (six) hours as needed for  wheezing or shortness of breath.  Marland Kitchen amLODipine (NORVASC) 5 MG tablet Take 1 tablet (5 mg total) by mouth daily.  . bicalutamide (CASODEX) 50 MG tablet Take 1 tablet (50 mg total) by mouth daily.  . budesonide-formoterol (SYMBICORT) 160-4.5 MCG/ACT inhaler Inhale 2 puffs into the lungs 2 (two) times daily.  . finasteride (PROSCAR) 5 MG tablet Take 1 tablet (5 mg total) by mouth daily.  . Fluticasone-Umeclidin-Vilant (TRELEGY ELLIPTA) 100-62.5-25 MCG/INH AEPB Inhale 2 puffs into the lungs daily.  Marland Kitchen gabapentin (NEURONTIN) 300 MG capsule Take 1 capsule (300 mg total) by mouth at bedtime.  . hydrochlorothiazide (MICROZIDE) 12.5 MG capsule Take 1 capsule (12.5 mg total) by mouth daily.  Marland Kitchen oxyCODONE (OXY IR/ROXICODONE) 5 MG immediate release tablet Take 1 tablet (5 mg total) by mouth every 4 (four) hours as needed for severe pain.  . pantoprazole (PROTONIX) 40 MG tablet Take 1 tablet (40 mg total) by mouth daily.  . sildenafil (VIAGRA) 50 MG tablet Take 1 tablet (50 mg total) by mouth daily as needed for erectile dysfunction.  . tamsulosin (FLOMAX) 0.4 MG CAPS capsule Take 1 capsule (0.4 mg total) by mouth in the morning and at bedtime.  . traZODone (DESYREL) 50 MG tablet Take 1 tablet (50 mg total) by mouth at bedtime.   No facility-administered encounter medications on file as of 11/25/2019.    PHYSICAL EXAM / ROS: Height:  6 feet  Weight:13 8 pounds BMI 18.84 kg/m General: In no acute distress Cardiovascular: regular rate and rhythm, no chest pain reported Pulmonary: no cough, no shortness of breath, clear ant/post fields, normal respiratory effort Abdomen: soft, non tender, positive bowel sounds in all quadrants GU: denies dysuria, no suprapubic tenderness Extremities: no edema, no joint deformities Skin: no rashes to exposed skin Neurological: Weakness but otherwise non focal; alert and oriented x3  Teodoro Spray, NP

## 2019-11-25 NOTE — Telephone Encounter (Signed)
Refill Request-   bicalutamide (CASODEX) 50 MG tablet  traZODone (DESYREL) 50 MG tablet  tamsulosin (FLOMAX) 0.4 MG CAPS capsule  [facility] ([place]), [place] - [address]

## 2019-11-29 ENCOUNTER — Other Ambulatory Visit: Payer: Self-pay | Admitting: *Deleted

## 2019-11-29 MED ORDER — SILDENAFIL CITRATE 50 MG PO TABS: 50.0000 mg | ORAL_TABLET | Freq: Every day | ORAL | 0 refills | Status: DC | PRN

## 2019-12-01 ENCOUNTER — Encounter: Payer: Medicare Other | Admitting: Student

## 2019-12-02 ENCOUNTER — Encounter (HOSPITAL_COMMUNITY): Payer: Self-pay

## 2019-12-02 ENCOUNTER — Other Ambulatory Visit: Payer: Self-pay

## 2019-12-02 ENCOUNTER — Ambulatory Visit (HOSPITAL_COMMUNITY)
Admission: RE | Admit: 2019-12-02 | Discharge: 2019-12-02 | Disposition: A | Discharge status: Home / Self Care | Payer: Medicare Other | Source: Ambulatory Visit | Attending: Hematology and Oncology | Admitting: Hematology and Oncology

## 2019-12-02 DIAGNOSIS — C7951 Secondary malignant neoplasm of bone: Secondary | ICD-10-CM | POA: Diagnosis present

## 2019-12-02 DIAGNOSIS — C61 Malignant neoplasm of prostate: Secondary | ICD-10-CM

## 2019-12-02 MED ORDER — IOHEXOL 300 MG/ML  SOLN
100.0000 mL | Freq: Once | INTRAMUSCULAR | Status: AC | PRN
  Administered 2019-12-02: 100 mL via INTRAVENOUS

## 2019-12-03 ENCOUNTER — Other Ambulatory Visit: Payer: Self-pay

## 2019-12-03 ENCOUNTER — Telehealth: Payer: Self-pay | Admitting: *Deleted

## 2019-12-03 MED ORDER — TAMSULOSIN HCL 0.4 MG PO CAPS
0.4000 mg | ORAL_CAPSULE | Freq: Two times a day (BID) | ORAL | 0 refills | Status: DC
Start: 2019-12-03 — End: 2020-01-07

## 2019-12-03 NOTE — Telephone Encounter (Signed)
Received call from pt's caregiver, [name].  She states that patient needs refill of his Flomax. Advised to call his PCP, Dr. Darrick Meigs as she is the provider ordering this medication. [name] voiced understanding and will contact PCP

## 2019-12-03 NOTE — Telephone Encounter (Signed)
tamsulosin (FLOMAX) 0.4 MG CAPS capsule, refill request @ [facility].

## 2019-12-06 ENCOUNTER — Other Ambulatory Visit: Payer: Self-pay

## 2019-12-06 ENCOUNTER — Encounter (HOSPITAL_COMMUNITY)
Admission: RE | Admit: 2019-12-06 | Discharge: 2019-12-06 | Disposition: A | Discharge status: Home / Self Care | Payer: Medicare Other | Source: Ambulatory Visit | Attending: Hematology and Oncology | Admitting: Hematology and Oncology

## 2019-12-06 DIAGNOSIS — C61 Malignant neoplasm of prostate: Secondary | ICD-10-CM

## 2019-12-06 DIAGNOSIS — C7951 Secondary malignant neoplasm of bone: Secondary | ICD-10-CM | POA: Diagnosis present

## 2019-12-06 MED ORDER — TECHNETIUM TC 99M MEDRONATE IV KIT
19.6000 | PACK | Freq: Once | INTRAVENOUS | Status: AC
  Administered 2019-12-06: 19.6 via INTRAVENOUS

## 2019-12-07 ENCOUNTER — Other Ambulatory Visit: Payer: Self-pay | Admitting: Hematology and Oncology

## 2019-12-07 DIAGNOSIS — C61 Malignant neoplasm of prostate: Secondary | ICD-10-CM

## 2019-12-07 DIAGNOSIS — C7951 Secondary malignant neoplasm of bone: Secondary | ICD-10-CM

## 2019-12-08 ENCOUNTER — Telehealth: Payer: Self-pay | Admitting: Pharmacist

## 2019-12-08 ENCOUNTER — Inpatient Hospital Stay (HOSPITAL_BASED_OUTPATIENT_CLINIC_OR_DEPARTMENT_OTHER): Payer: Medicare Other | Admitting: Hematology and Oncology

## 2019-12-08 ENCOUNTER — Inpatient Hospital Stay: Payer: Medicare Other

## 2019-12-08 ENCOUNTER — Telehealth: Payer: Self-pay

## 2019-12-08 ENCOUNTER — Other Ambulatory Visit: Payer: Self-pay | Admitting: Hematology and Oncology

## 2019-12-08 ENCOUNTER — Encounter: Payer: Self-pay | Admitting: Hematology and Oncology

## 2019-12-08 ENCOUNTER — Other Ambulatory Visit: Payer: Self-pay

## 2019-12-08 VITALS — BP 121/80 | HR 115 | Temp 97.0°F | Resp 18 | Ht 72.0 in | Wt 141.2 lb

## 2019-12-08 DIAGNOSIS — C61 Malignant neoplasm of prostate: Secondary | ICD-10-CM

## 2019-12-08 DIAGNOSIS — C7951 Secondary malignant neoplasm of bone: Secondary | ICD-10-CM | POA: Diagnosis not present

## 2019-12-08 LAB — CMP (CANCER CENTER ONLY)
ALT: 6 U/L (ref 0–44)
AST: 12 U/L — ABNORMAL LOW (ref 15–41)
Albumin: 4.1 g/dL (ref 3.5–5.0)
Alkaline Phosphatase: 75 U/L (ref 38–126)
Anion gap: 5 (ref 5–15)
BUN: 16 mg/dL (ref 8–23)
CO2: 29 mmol/L (ref 22–32)
Calcium: 9.8 mg/dL (ref 8.9–10.3)
Chloride: 101 mmol/L (ref 98–111)
Creatinine: 0.83 mg/dL (ref 0.61–1.24)
GFR, Estimated: >60 mL/min (ref >60)
Glucose, Bld: 116 mg/dL — ABNORMAL HIGH (ref 70–99)
Potassium: 3.7 mmol/L (ref 3.5–5.1)
Sodium: 135 mmol/L (ref 135–145)
Total Bilirubin: 0.3 mg/dL (ref 0.3–1.2)
Total Protein: 8 g/dL (ref 6.5–8.1)

## 2019-12-08 LAB — CBC WITH DIFFERENTIAL (CANCER CENTER ONLY)
Abs Immature Granulocytes: 0.01 10*3/uL (ref 0.00–0.07)
Basophils Absolute: 0 10*3/uL (ref 0.0–0.1)
Basophils Relative: 1 %
Eosinophils Absolute: 0.4 10*3/uL (ref 0.0–0.5)
Eosinophils Relative: 6 %
HCT: 36.8 % — ABNORMAL LOW (ref 39.0–52.0)
Hemoglobin: 12.2 g/dL — ABNORMAL LOW (ref 13.0–17.0)
Immature Granulocytes: 0 %
Lymphocytes Relative: 25 %
Lymphs Abs: 1.4 10*3/uL (ref 0.7–4.0)
MCH: 28.1 pg (ref 26.0–34.0)
MCHC: 33.2 g/dL (ref 30.0–36.0)
MCV: 84.8 fL (ref 80.0–100.0)
Monocytes Absolute: 0.7 10*3/uL (ref 0.1–1.0)
Monocytes Relative: 12 %
Neutro Abs: 3.3 10*3/uL (ref 1.7–7.7)
Neutrophils Relative %: 56 %
Platelet Count: 420 10*3/uL — ABNORMAL HIGH (ref 150–400)
RBC: 4.34 MIL/uL (ref 4.22–5.81)
RDW: 13.4 % (ref 11.5–15.5)
WBC Count: 5.8 10*3/uL (ref 4.0–10.5)
nRBC: 0 % (ref 0.0–0.2)

## 2019-12-08 MED ORDER — OXYCODONE HCL ER 15 MG PO T12A
30.0000 mg | EXTENDED_RELEASE_TABLET | Freq: Two times a day (BID) | ORAL | 0 refills | Status: DC
Start: 2019-12-08 — End: 2019-12-08

## 2019-12-08 MED ORDER — ABIRATERONE ACETATE 250 MG PO TABS: 1000.0000 mg | ORAL_TABLET | Freq: Every day | ORAL | 0 refills | Status: DC

## 2019-12-08 MED ORDER — PREDNISONE 5 MG PO TABS: 5.0000 mg | ORAL_TABLET | Freq: Every day | ORAL | 1 refills | Status: DC

## 2019-12-08 MED ORDER — OXYCODONE HCL ER 30 MG PO T12A
30.0000 mg | EXTENDED_RELEASE_TABLET | Freq: Two times a day (BID) | ORAL | 0 refills | Status: DC
Start: 2019-12-08 — End: 2019-12-27

## 2019-12-08 MED ORDER — CALCIUM CARBONATE-VITAMIN D 500-200 MG-UNIT PO TABS: 1.0000 | ORAL_TABLET | Freq: Every day | ORAL | 3 refills | Status: DC

## 2019-12-08 MED ORDER — TADALAFIL 10 MG PO TABS: 10.0000 mg | ORAL_TABLET | Freq: Every day | ORAL | 1 refills | Status: DC | PRN

## 2019-12-08 MED FILL — OxyCONTIN 30 MG T12A: 30 | 30 days supply | Qty: 60 | Fill #0

## 2019-12-08 NOTE — Telephone Encounter (Signed)
Oral Oncology Patient Advocate Encounter  Received notification from [facility] that prior authorization for Dustin Alvarez is required.  PA submitted on CoverMyMeds Key [id number] Status is pending  Oral Oncology Clinic will continue to follow.   [facility] Phone [phone] Fax [phone] 12/08/2019 3:53 PM

## 2019-12-08 NOTE — Progress Notes (Signed)
[place] [phone]   [phone]  PROGRESS NOTE  Patient Care Team: [name], MD as PCP - General (Internal Medicine)  Hematological/Oncological History # Metastatic Castrate Sensitive Prostate Cancer, Metastatic to Bone 1) 07/13/2019: Abdomen/Pelvis CT extensive lytic changes with superimposed pathological fractures. Lytic change noted in the right iliac bone and new lucencies in the right T10 vertebrae.  2) 07/19/2019: biopsy of sacral mass shows metastatic prostatic adenocarcinoma, Gleason 4+4.  3) 07/2019: reportedly received Zometa 4g IV and eligard 22.5. Started on Casodex.  4) 6/10-6/16/2021: received palliative radiation to the sacrum at [place]. Received 2000cGy over 6 days 5) Moved to [place]. Lost to follow up from [facility] in [place], [place]. 6) 11/05/2019: establish care with Dr. Lorenso Courier  7) 11/16/2019: Administered Zometa 4mg  IV and Lupron 22.5 mg  Interval History:  Dustin Alvarez 64 y.o. male with medical history significant for metastatic prostate cancer who presents for a follow up visit. The patient's last visit was on 11/05/2019 at which time he established care. In the interim since the last visit he completed his CT C/A/P and NM bone scan.   On exam today [name] is accompanied by his daughter.  He reports he has been well in the interim since our last visit.  He reports that his pain is about a 7.5 out of 10 in severity and that this is his primary issue.  He notes it occasionally causes him so much difficulty he is unable to sleep.  He reports taking about 60 mg of the fast acting oxycodone daily.  He notes that his eating has been okay and his weight has been stable.  His bowels have been moving well despite his aggressive pain regimen.  He reports that the Viagra he was prescribed has helped him to get erections but he has not been able to maintain them long enough to enjoy sexual intercourse.  He is  asking for a different medication to help with his sexual dysfunction.  He currently denies having issues with fevers, chills, sweats, nausea, vomiting or diarrhea.  The bulk of our discussion today focused on the addition of abiraterone therapy to his current ADT treatment.  Additionally we discussed that there may be utility in palliative radiation to his metastatic disease in order to help with pain.  MEDICAL HISTORY:  Past Medical History:  Diagnosis Date  . Asthma   . COPD (chronic obstructive pulmonary disease) ([place])   . Hypertension     SURGICAL HISTORY: Past Surgical History:  Procedure Laterality Date  . WRIST SURGERY Left 2017    SOCIAL HISTORY: Social History   Socioeconomic History  . Marital status: Legally Separated    Spouse name: Not on file  . Number of children: Not on file  . Years of education: Not on file  . Highest education level: Not on file  Occupational History  . Not on file  Tobacco Use  . Smoking status: Current Every Day Smoker    Packs/day: 1.00  . Smokeless tobacco: Never Used  . Tobacco comment: almost 1 pkd  Substance and Sexual Activity  . Alcohol use: Yes    Comment: 1-2 drinks per week.  . Drug use: Never  . Sexual activity: Not on file  Other Topics Concern  . Not on file  Social History Narrative  . Not on file   Social Determinants of Health   Financial Resource Strain:   . Difficulty of Paying Living Expenses: Not on file  Food Insecurity:   . Worried About Charity fundraiser in the Last Year: Not on file  . Ran Out of Food in the Last Year: Not on file  Transportation Needs:   . Lack of Transportation (Medical): Not on file  . Lack of Transportation (Non-Medical): Not on file  Physical Activity:   . Days of Exercise per Week: Not on file  . Minutes of Exercise per Session: Not on file  Stress:   . Feeling of Stress : Not on file  Social Connections:   . Frequency of Communication with Friends and Family: Not on  file  . Frequency of Social Gatherings with Friends and Family: Not on file  . Attends Religious Services: Not on file  . Active Member of Clubs or Organizations: Not on file  . Attends Archivist Meetings: Not on file  . Marital Status: Not on file  Intimate Partner Violence:   . Fear of Current or Ex-Partner: Not on file  . Emotionally Abused: Not on file  . Physically Abused: Not on file  . Sexually Abused: Not on file    FAMILY HISTORY: Family History  Problem Relation Age of Onset  . COPD Father   . Diabetes Sister   . Diabetes Brother     ALLERGIES:  is allergic to ace inhibitors.  MEDICATIONS:  Current Outpatient Medications  Medication Sig Dispense Refill  . albuterol (VENTOLIN HFA) 108 (90 Base) MCG/ACT inhaler Inhale 2 puffs into the lungs every 6 (six) hours as needed for wheezing or shortness of breath. 8 g 3  . amLODipine (NORVASC) 5 MG tablet Take 1 tablet (5 mg total) by mouth daily. 90 tablet 3  . bicalutamide (CASODEX) 50 MG tablet Take 1 tablet (50 mg total) by mouth daily. 30 tablet 0  . budesonide-formoterol (SYMBICORT) 160-4.5 MCG/ACT inhaler Inhale 2 puffs into the lungs 2 (two) times daily.    . finasteride (PROSCAR) 5 MG tablet Take 1 tablet (5 mg total) by mouth daily. 30 tablet 2  . Fluticasone-Umeclidin-Vilant (TRELEGY ELLIPTA) 100-62.5-25 MCG/INH AEPB Inhale 2 puffs into the lungs daily. 180 each 0  . gabapentin (NEURONTIN) 300 MG capsule Take 1 capsule (300 mg total) by mouth at bedtime. 30 capsule 2  . hydrochlorothiazide (MICROZIDE) 12.5 MG capsule Take 1 capsule (12.5 mg total) by mouth daily. 90 capsule 1  . oxyCODONE (OXY IR/ROXICODONE) 5 MG immediate release tablet Take 1 tablet (5 mg total) by mouth every 4 (four) hours as needed for severe pain. 180 tablet 0  . pantoprazole (PROTONIX) 40 MG tablet Take 1 tablet (40 mg total) by mouth daily. 90 tablet 3  . sildenafil (VIAGRA) 50 MG tablet Take 1 tablet (50 mg total) by mouth daily as  needed for erectile dysfunction. 10 tablet 0  . tamsulosin (FLOMAX) 0.4 MG CAPS capsule Take 1 capsule (0.4 mg total) by mouth in the morning and at bedtime. 60 capsule 0  . traZODone (DESYREL) 50 MG tablet Take 1 tablet (50 mg total) by mouth at bedtime. 30 tablet 0   No current facility-administered medications for this visit.    REVIEW OF SYSTEMS:   Constitutional: ( - ) fevers, ( - )  chills , ( - ) night sweats Eyes: ( - ) blurriness of vision, ( - ) double vision, ( - ) watery eyes Ears, nose, mouth, throat, and face: ( - ) mucositis, ( - ) sore throat Respiratory: ( - ) cough, ( - ) dyspnea, ( - )  wheezes Cardiovascular: ( - ) palpitation, ( - ) chest discomfort, ( - ) lower extremity swelling Gastrointestinal:  ( - ) nausea, ( - ) heartburn, ( - ) change in bowel habits Skin: ( - ) abnormal skin rashes Lymphatics: ( - ) new lymphadenopathy, ( - ) easy bruising Neurological: ( - ) numbness, ( - ) tingling, ( - ) new weaknesses Behavioral/Psych: ( - ) mood change, ( - ) new changes  All other systems were reviewed with the patient and are negative.  PHYSICAL EXAMINATION:  Vitals:   12/08/19 1118  BP: 121/80  Pulse: (!) 115  Resp: 18  Temp: (!) 97 F (36.1 C)  SpO2: 98%   Filed Weights   12/08/19 1118  Weight: 141 lb 3.2 oz (64 kg)    GENERAL: well appearing elderly African American male in NAD. SKIN: skin color, texture, turgor are normal, no rashes or significant lesions EYES: conjunctiva are pink and non-injected, sclera clear LUNGS: clear to auscultation and percussion with normal breathing effort HEART: regular rate & rhythm and no murmurs and no lower extremity edema ABDOMEN: soft, non-tender, non-distended, normal bowel sounds Musculoskeletal: no cyanosis of digits and no clubbing  PSYCH: alert & oriented x 3, fluent speech NEURO: no focal motor/sensory deficits  LABORATORY DATA:  I have reviewed the data as listed CBC Latest Ref Rng & Units 12/08/2019  11/16/2019 11/05/2019  WBC 4.0 - 10.5 K/uL 5.8 4.9 6.5  Hemoglobin 13.0 - 17.0 g/dL 12.2(L) 11.9(L) 12.2(L)  Hematocrit 39 - 52 % 36.8(L) 35.0(L) 36.2(L)  Platelets 150 - 400 K/uL 420(H) 388 409(H)    CMP Latest Ref Rng & Units 12/08/2019 11/16/2019 11/05/2019  Glucose 70 - 99 mg/dL 116(H) 112(H) 111(H)  BUN 8 - 23 mg/dL 16 12 11   Creatinine 0.61 - 1.24 mg/dL 0.83 0.85 0.80  Sodium 135 - 145 mmol/L 135 138 135  Potassium 3.5 - 5.1 mmol/L 3.7 3.5 4.5  Chloride 98 - 111 mmol/L 101 102 98  CO2 22 - 32 mmol/L 29 28 28   Calcium 8.9 - 10.3 mg/dL 9.8 9.6 10.0  Total Protein 6.5 - 8.1 g/dL 8.0 7.6 8.0  Total Bilirubin 0.3 - 1.2 mg/dL 0.3 0.3 0.3  Alkaline Phos 38 - 126 U/L 75 72 81  AST 15 - 41 U/L 12(L) 15 15  ALT 0 - 44 U/L 6 10 11     RADIOGRAPHIC STUDIES: NM Bone Scan Whole Body  Result Date: 12/07/2019 CLINICAL DATA:  Prostate cancer, assess response to treatment, PSA 44.8, having low back and LEFT buttock pain for 4 months EXAM: NUCLEAR MEDICINE WHOLE BODY BONE SCAN TECHNIQUE: Whole body anterior and posterior images were obtained approximately 3 hours after intravenous injection of radiopharmaceutical. RADIOPHARMACEUTICALS:  19.6 mCi Technetium-61m MDP IV COMPARISON:  None Correlation: CT chest abdomen and pelvis 12/02/2019 FINDINGS: Uptake at lateral LEFT seventh rib corresponding to healing fracture on CT. Uptake throughout upper and mid sacrum, corresponding to mixed lytic and bone destruction with displaced pathologic fracture involving the S1 segment. By CT this could represent either metastatic disease or radiation necrosis. Scintigraphically, favor metastatic disease with pathologic fracture; typically, slightly increased tracer uptake is seen during performance of radiation therapy though radiation necrosis usually demonstrates decreased activity, but a pathologic fracture could account for increased uptake. No other focal scintigraphic abnormalities identified. Expected urinary tract  and soft tissue distribution of tracer. IMPRESSION: Significant tracer uptake throughout the sacrum corresponding to sacral bone destruction within the accompanying pathologic fracture through the S1 segment  on CT, favor metastatic disease over radiation necrosis. Healing fracture of the lateral LEFT seventh rib. No other scintigraphic abnormalities. Electronically Signed   By: Lavonia Dana M.D.   On: 12/07/2019 09:39   CT CHEST ABDOMEN PELVIS W CONTRAST  Result Date: 12/03/2019 CLINICAL DATA:  Prostate cancer. Chemotherapy in progress. Radiation therapy complete. EXAM: CT CHEST, ABDOMEN, AND PELVIS WITH CONTRAST TECHNIQUE: Multidetector CT imaging of the chest, abdomen and pelvis was performed following the standard protocol during bolus administration of intravenous contrast. CONTRAST:  175mL OMNIPAQUE IOHEXOL 300 MG/ML  SOLN COMPARISON:  None. FINDINGS: CT CHEST FINDINGS Cardiovascular: The heart is normal in size. No pericardial effusion. There is mild tortuosity of thoracic aorta but no atherosclerotic calcifications or dissection. Age advanced three-vessel coronary artery calcifications are noted. The pulmonary arteries appear normal. Mediastinum/Nodes: Scattered mediastinal and hilar lymph nodes. No mass or overt adenopathy. The esophagus is grossly normal. The thyroid gland is unremarkable. Lungs/Pleura: Moderate to advanced emphysematous changes and pulmonary scarring. No worrisome pulmonary lesions and no pulmonary nodules to suggest metastatic disease. There is an 11 mm lesion in the right mainstem bronchus. It measures fluid attenuation and may be mucous or possibly a cyst. I would recommend a follow-up noncontrast chest CT in 3 months to reassess this. Musculoskeletal: No chest wall mass, supraclavicular or axillary adenopathy. The bony thorax is intact. No lytic or sclerotic bone lesions are identified. CT ABDOMEN PELVIS FINDINGS Hepatobiliary: No hepatic lesions to suggest metastatic disease. The  gallbladder is normal. No common bile duct dilatation. The portal and hepatic veins are patent. Pancreas: No mass, inflammation or ductal dilatation. Spleen: Normal size.  No focal lesions. Adrenals/Urinary Tract: Adrenal glands and kidneys are unremarkable. Simple lower pole right renal cyst noted. The bladder is unremarkable. Stomach/Bowel: The stomach, duodenum, small bowel and colon are unremarkable. No acute inflammatory changes, mass lesions or obstructive findings. The terminal and appendix are normal. Vascular/Lymphatic: The aorta and branch vessels are patent. Age advanced atherosclerotic calcifications involving the distal aorta and iliac arteries no aneurysm or dissection. No mesenteric or retroperitoneal mass or lymphadenopathy. Reproductive: The prostate gland and seminal vesicles are grossly normal. No obvious mass. Other: No pelvic mass or pelvic adenopathy. No inguinal adenopathy. No free pelvic fluid collections. Musculoskeletal: Extensive destructive bony changes involving the upper sacrum with a pathologic fracture of S1. Findings could be due to metastatic prostate cancer or possibly radiation necrosis. I do not see any other obvious bone lesions. Moderate degenerative changes involving both hips, right greater than left. IMPRESSION: 1. Extensive destructive bony changes involving the upper sacrum with a pathologic fracture of S1. Findings could be due to metastatic prostate cancer or possibly radiation necrosis. 2. No other obvious bone lesions. 3. No findings for metastatic disease involving the chest, abdomen or pelvis. 4. Moderate to advanced emphysematous changes and pulmonary scarring. 5. 11 mm lesion in the right mainstem bronchus, possibly mucous or possibly a cyst. Recommend follow-up noncontrast chest CT in 3 months to reassess. 6. Age advanced atherosclerotic calcifications involving the distal aorta and iliac arteries. 7. Age advanced three-vessel coronary artery calcifications. 8.  Emphysema and aortic atherosclerosis. Aortic Atherosclerosis (ICD10-I70.0) and Emphysema (ICD10-J43.9). Electronically Signed   By: Marijo Sanes M.D.   On: 12/03/2019 12:28    ASSESSMENT & PLAN CALLIN ASHE 64 y.o. male with medical history significant for metastatic prostate cancer who presents for a follow up visit.  Mr. Lederman has excessively transitioned off the Casodex and has received his first dosages of  Zometa and Lupron.  His PSA continues to trend downward and is testosterone is at castrate level.  Given this we will begin to start abiraterone therapy 1000 mg p.o. daily with 5 mg of prednisone.  In the interim we have also obtained new baseline scans.  The major symptom the patient has been experiencing has been pain.  His spinal sacral lesion has been poorly controlled on oxycodone therapy.  He did previously receive radiation therapy in June 2021, but due to continued excruciating pain I would like him to be reexamined by radiation oncology to see if there is any further intervention they can offer at this time.  In the interim we will add OxyContin 30 mg twice daily and additionally continue his as needed fast acting oxycodone.  Due to starting abiraterone we will plan to have him back in 2 weeks for lab visit and in 4 weeks for clinic visit.  PSA 07/13/2019: PSA 819 08/27/2019: 226 11/05/2019: 44.8 12/08/2019: 27.3  # Metastatic Castrate Sensitive Prostate Cancer, Metastatic to Bone --findings are most consistent with metastatic adenocarcinoma of the prostate.  --testosterone at last visit was 4, PSA down to 27.3 ( from 819 at diagnosis)  --Administered Zometa 4mg  IV and Lupron 22.5 mg on 11/16/2019. Next due Jan 2022.  --recommend starting patient on abiraterone therapy he has d/c casodex. Will plan for abiraterone 1000mg  with prednisone 5mg  PO daily. Prescription placed today.  --RTC in 4 weeks with interval 2 week lab check on Abiraterone therapy   #Symptom  Management --patient requested Cialis due to issues with ED (Viagra was ineffective). Will prescribe this for him today with caution about hypotension/syncope --gabapentin 300 mg nightly to help with pain.  --continue oxycodone 5-10mg  q4H PRN for pain control. --addition of oxycontin 30mg  q12H for basal pain control --refer to Radiation Oncology to help determine if there is any further radiation or intervention that can help with his pain.   No orders of the defined types were placed in this encounter.   All questions were answered. The patient knows to call the clinic with any problems, questions or concerns.  A total of more than 30 minutes were spent on this encounter and over half of that time was spent on counseling and coordination of care as outlined above.   Ledell Peoples, MD Department of Hematology/Oncology [place] at [facility] Phone: [phone] Pager: [phone] Email: [email or web address]  12/08/2019 12:01 PM

## 2019-12-09 ENCOUNTER — Telehealth: Payer: Self-pay | Admitting: Hematology and Oncology

## 2019-12-09 LAB — TESTOSTERONE: Testosterone: 4 ng/dL — ABNORMAL LOW (ref 264–916)

## 2019-12-09 LAB — PROSTATE-SPECIFIC AG, SERUM (LABCORP): Prostate Specific Ag, Serum: 27.3 ng/mL — ABNORMAL HIGH (ref 0.0–4.0)

## 2019-12-09 MED FILL — ABIRATERONE ACETATE 250 MG: 250 | 30 days supply | Qty: 120 | Fill #0

## 2019-12-09 NOTE — Telephone Encounter (Signed)
Scheduled per los. Called, not able to leave msg.mailed printout  °

## 2019-12-09 NOTE — Telephone Encounter (Signed)
Oral Chemotherapy Pharmacist Encounter  I spoke with patient and his son for overview of: Zytiga (abiraterone acetate) for the treatment of metastatic, castration-sensitive prostate cancer in conjunction with prednisone, planned duration until disease progression or unacceptable toxicity.   Counseled patient on administration, dosing, side effects, monitoring, drug-food interactions, safe handling, storage, and disposal.  Patient will take Zytiga 250mg  tablets, 4 tablets (1000mg ) by mouth once daily on an empty stomach, 1 hour before or 2 hours after a meal.  Patient states he will take his Zytiga in the morning and will wait at least 1 hour before eating.  Patient will take prednisone 5mg  tablet, 1 tablet by mouth one daily with breakfast.  Zytiga start date: 12/11/19  Adverse effects include but are not limited to: peripheral edema, GI upset, hypertension, hot flashes, fatigue, and arthralgias.    Prednisone prescription has been sent to [facility] on 12/08/19. Patient will obtain prednisone tomorrow (12/10/19) and knows to start prednisone on the same day as Zytiga start.  Reviewed with patient importance of keeping a medication schedule and plan for any missed doses. No barriers to medication adherence identified.  Medication reconciliation performed and medication/allergy list updated.  Insurance authorization for Dustin Alvarez has been obtained. Test claim at the pharmacy revealed copayment $3.70 for 1st fill of Zytiga. Patient's son will pick this up from the [facility] on 12/10/19.  Patient informed the pharmacy will reach out 5-7 days prior to needing next fill of Zytiga to coordinate continued medication acquisition to prevent break in therapy.  All questions answered.  Dustin Alvarez voiced understanding and appreciation.   Medication education handout placed in mail for patient. Patient knows to call the office with questions or concerns. Oral Chemotherapy  Clinic phone number provided to patient.   Leron Croak, PharmD, BCPS Hematology/Oncology Clinical Pharmacist [facility] [phone] 12/09/2019 2:52 PM

## 2019-12-09 NOTE — Telephone Encounter (Signed)
Oral Oncology Pharmacist Encounter  Received new prescription for Zytiga (abiraterone acetate) for the treatment of metastatic castration-sensitive prostate cancer in conjunction with prednisone, planned duration until disease progression or unacceptable drug toxicity.  Prescription dose and frequency assessed for appropriateness. Appropriate for therapy initiation.   CBC w/ Diff and CMP from 12/08/19 assessed, labs stable for treatment initiation.  Current medication list in Epic reviewed, DDIs with Zytiga (abiraterone acetate) identified:  Category C DDI between Zytiga and Tamsulosin - Zytiga, a CYP2D6 inhibitor may increase serum concentrations of tamsulosin - will counsel patient to monitor with s/sx of hypotension. Noted stable BP of 121/80 mmHg at office visit on 12/08/19.   Evaluated chart and no patient barriers to medication adherence noted.   Prescription has been e-scribed to the [facility] for benefits analysis and approval.  Oral Oncology Clinic will continue to follow for insurance authorization, copayment issues, initial counseling and start date.  Leron Croak, PharmD, BCPS Hematology/Oncology Clinical Pharmacist [facility] [phone] 12/09/2019 8:46 AM

## 2019-12-09 NOTE — Telephone Encounter (Signed)
Oral Oncology Patient Advocate Encounter  Prior Authorization for Fabio Asa has been approved.    PA# [id number] Effective dates: 12/09/19 through 12/07/20  Patients co-pay is $3.70  Oral Oncology Clinic will continue to follow.   [facility] Phone [phone] Fax [phone] 12/09/2019 8:26 AM

## 2019-12-13 ENCOUNTER — Telehealth: Payer: Self-pay | Admitting: *Deleted

## 2019-12-13 ENCOUNTER — Telehealth: Payer: Self-pay | Admitting: Hematology and Oncology

## 2019-12-13 NOTE — Telephone Encounter (Signed)
Called pt per 10/31 sch msg - left message for patient with appt date and time

## 2019-12-13 NOTE — Telephone Encounter (Signed)
TCT patient. No answer. Left vm message for pt to return this call @ [phone] at his earliest convenience.

## 2019-12-13 NOTE — Telephone Encounter (Signed)
-----   Message from [name], MD sent at 12/12/2019 11:56 AM EDT ----- Please let Dustin Alvarez know that his testosterone is at goal levels and his PSA has fallen to 27. Please assure he has (and is taking) his abiraterone 1000mg  daily with 5 mg prednisone daily.  ----- Message ----- From: Buel Ream, [facility] [place] Sent: 12/08/2019  11:11 AM EDT To: [name], MD

## 2019-12-16 ENCOUNTER — Encounter: Payer: Self-pay | Admitting: Radiation Oncology

## 2019-12-16 NOTE — Progress Notes (Signed)
Histology and Location of Primary Cancer: MetastaticCastrate SensitiveProstate Cancer  Sites of Visceral and Bony Metastatic Disease: bony  Location(s) of Symptomatic Metastases: S1 and Left 7th rib  Past/Anticipated chemotherapy by medical oncology, if any:  1) 07/13/2019: Abdomen/Pelvis CT extensive lytic changes with superimposed pathological fractures. Lytic change noted in the right iliac bone and new lucencies in the right T10 vertebrae.  2) 07/19/2019: biopsy of sacral mass shows metastatic prostatic adenocarcinoma, Gleason 4+4.  3) 07/2019: reportedly received Zometa 4g IV and eligard 22.5. Started on Casodex.  4) 6/10-6/16/2021: received palliative radiation to the sacrum at [facility]. Received 2000cGy over 6 days 5) Moved to [place]. Lost to follow up from [facility] in [place], [place]. 6) 11/05/2019: establish care with Dr. Lorenso Courier 7) 11/16/2019: Administered Zometa 42m IV and Lupron 22.5 mg  Pain on a scale of 0-10 is: Reports constant pain across buttock worse on left than right. Reports pain radiates down his left leg with numbness and "freezing" in his toes.Reports taking gabapentin 30 before bed, oxycontin long acting, and percocet 1-2 tabs every 4-6 hours to manage pain. Reports he is never pain free.    If Spine Met(s), symptoms, if any, include:  Bowel/Bladder retention or incontinence (please describe): denies  Numbness or weakness in extremities (please describe): yes, bilateral lower extremities.   Current Decadron regimen, if applicable: on prednisone  Ambulatory status? Walker? Wheelchair?: Ambulatory for short distances  SAFETY ISSUES:  Prior radiation? yes  Pacemaker/ICD? no  Possible current pregnancy? no, male patient  Is the patient on methotrexate? no  Current Complaints / other details:  64year old male.

## 2019-12-17 ENCOUNTER — Other Ambulatory Visit: Payer: Self-pay

## 2019-12-17 ENCOUNTER — Ambulatory Visit
Admission: RE | Admit: 2019-12-17 | Discharge: 2019-12-17 | Disposition: A | Discharge status: Home / Self Care | Payer: Medicare Other | Source: Ambulatory Visit | Attending: Radiation Oncology | Admitting: Radiation Oncology

## 2019-12-17 ENCOUNTER — Encounter: Payer: Self-pay | Admitting: Radiation Oncology

## 2019-12-17 VITALS — BP 114/85 | HR 102 | Temp 97.2°F | Resp 18 | Ht 72.0 in | Wt 143.5 lb

## 2019-12-17 DIAGNOSIS — Z923 Personal history of irradiation: Secondary | ICD-10-CM | POA: Insufficient documentation

## 2019-12-17 DIAGNOSIS — Z9221 Personal history of antineoplastic chemotherapy: Secondary | ICD-10-CM | POA: Insufficient documentation

## 2019-12-17 DIAGNOSIS — C7951 Secondary malignant neoplasm of bone: Secondary | ICD-10-CM

## 2019-12-17 DIAGNOSIS — C61 Malignant neoplasm of prostate: Secondary | ICD-10-CM | POA: Diagnosis not present

## 2019-12-17 HISTORY — DX: Malignant neoplasm of prostate: C61

## 2019-12-17 NOTE — Progress Notes (Signed)
Radiation Oncology         [phone] ________________________________  Initial Outpatient Consultation  Name: SOLMON Alvarez MRN: [id number]  Date: 12/17/2019  DOB: November 12, 1955  WN:IOEVOJJKK, [name], MD  [name], MD   REFERRING PHYSICIAN: [name], MD  DIAGNOSIS: 64 y.o. gentleman with oligometastatic castrate-sensitive prostate cancer, Gleason 4+4 with current PSA 27.3 on ADT.    ICD-10-CM   1. Prostate cancer metastatic to bone [facility])  C61    C79.51     HISTORY OF PRESENT ILLNESS: Dustin Alvarez is a 64 y.o. male with a diagnosis of prostate cancer. He initially presented to the ED at [facility] in [place], [place] with complaints of low back pain, radiating into the legs bilaterally. He had also been experiencing progressively worsening LUTS for the past 6 months-1 year.  He underwent CT A/P on 07/13/2019 showing extensive lytic changes within the sacrum bilaterally with some superimposed pathologic fractures with mild displacement as well as lytic change in the right iliac bone and lucencies in the right T10 vertebral body.  To complete his disease staging, he had a CT Chest on 07/14/19 showing diffuse emphysematous changes with mild scarring and a faint lucency in the T10 vertebrae. This was followed by a bone scan which showed increased activity in the sacrum secondary to known bony lesions and fractures and slightly heterogeneous activity throughout the ribs.  On 07/16/2019, he underwent pelvis and thoracic spine MRIs confirming metastatic involvement in the sacrum with pathologic fracture and soft tissue encroaches upon the upper sacral canal.  There were no compression fractures or other acute findings in thoracic spine. A lumbar spine MRI was performed on 07/17/2019 and showed suspected metastatic involvement of L4 and L5 with adjacent dural metastasis.  He proceeded to biopsy of the sacral mass on 07/19/2019 confirming metastatic prostatic  adenocarcinoma, Gleason 4+4. He was referred to Dr. Larose Kells at [facility] in [place], [place] received Zometa and Eligard 22.5mg  in addition to oral Casodex. The Casodex was discontinued in 08/2019.  He was also treated with palliative radiation therapy to the sacrum, 20 Gy in 5 fractions from 07/22/19 -  07/28/19 under the care of Dr. Loa Socks. He reports improvement in the low back pain for a few months but his pain returned and is progressively worsening over the past 6 weeks. He reports limited ambulation due to pain but denies focal weakness or paraesthesias. He denies any bowel or bladder dysfunction.  He subsequently moved to [facility] and was lost to follow up in [place] with his last follow up visit with his medical oncologist in 08/2019. His new PCP here in [place] referred him to Dr. Lorenso Courier on 11/05/2019. PSA obtained that day was 44.8. He received Zometa and Lupron 22.5mg  on 11/16/2019. He underwent restaging CT C/A/P on 12/02/2019 showing extensive destructive bony changes involving the upper sacrum with pathologic fracture of S1, findings could be due to metastatic prostate cancer or possibly radiation necrosis with no other obvious bony lesions or evidence of metastatic disease in the C/A/P. Bone scan on 12/06/2019 showed significant tracer uptake throughout the sacrum corresponding to sacral bone destruction with an accompanying pathologic fracture through the S1 segment, favoring metastatic disease over radiation necrosis and a healing fracture of the lateral left 7th rib.  His most recent PSA on 12/08/2019 was further decreased to 27.3, confirming continued response to ADT. Zytiga was added to his treatment regimen at that time and he reports taking  this as prescribed.  The patient has kindly been referred today for discussion of additional palliative radiation treatment options.   PREVIOUS RADIATION THERAPY: Yes  07/22/19 - 07/28/19: Sacrum / 20 Gy in 5  fractions at [facility] in [place], [place] HISTORY:  Past Medical History:  Diagnosis Date  . Asthma   . COPD (chronic obstructive pulmonary disease) ([place])   . Hypertension   . Prostate cancer ([place])       PAST SURGICAL HISTORY: Past Surgical History:  Procedure Laterality Date  . WRIST SURGERY Left 2017    FAMILY HISTORY:  Family History  Problem Relation Age of Onset  . COPD Father   . Diabetes Sister   . Diabetes Brother   . Breast cancer Neg Hx   . Prostate cancer Neg Hx   . Colon cancer Neg Hx   . Pancreatic cancer Neg Hx     SOCIAL HISTORY:  Social History   Socioeconomic History  . Marital status: Legally Separated    Spouse name: Not on file  . Number of children: Not on file  . Years of education: Not on file  . Highest education level: Not on file  Occupational History  . Not on file  Tobacco Use  . Smoking status: Current Every Day Smoker    Packs/day: 1.00  . Smokeless tobacco: Never Used  . Tobacco comment: almost 1 pkd  Vaping Use  . Vaping Use: Never used  Substance and Sexual Activity  . Alcohol use: Yes    Comment: 1-2 drinks per week.  . Drug use: Never  . Sexual activity: Not Currently  Other Topics Concern  . Not on file  Social History Narrative  . Not on file   Social Determinants of Health   Financial Resource Strain:   . Difficulty of Paying Living Expenses: Not on file  Food Insecurity:   . Worried About Charity fundraiser in the Last Year: Not on file  . Ran Out of Food in the Last Year: Not on file  Transportation Needs:   . Lack of Transportation (Medical): Not on file  . Lack of Transportation (Non-Medical): Not on file  Physical Activity:   . Days of Exercise per Week: Not on file  . Minutes of Exercise per Session: Not on file  Stress:   . Feeling of Stress : Not on file  Social Connections:   . Frequency of Communication with Friends and Family: Not on file  . Frequency of Social  Gatherings with Friends and Family: Not on file  . Attends Religious Services: Not on file  . Active Member of Clubs or Organizations: Not on file  . Attends Archivist Meetings: Not on file  . Marital Status: Not on file  Intimate Partner Violence:   . Fear of Current or Ex-Partner: Not on file  . Emotionally Abused: Not on file  . Physically Abused: Not on file  . Sexually Abused: Not on file    ALLERGIES: Ace inhibitors  MEDICATIONS:  Current Outpatient Medications  Medication Sig Dispense Refill  . abiraterone acetate (ZYTIGA) 250 MG tablet Take 4 tablets (1,000 mg total) by mouth daily. Take on an empty stomach 1 hour before or 2 hours after a meal 120 tablet 0  . albuterol (VENTOLIN HFA) 108 (90 Base) MCG/ACT inhaler Inhale 2 puffs into the lungs every 6 (six) hours as needed for wheezing or shortness of breath. 8 g 3  . amLODipine (NORVASC) 5  MG tablet Take 1 tablet (5 mg total) by mouth daily. 90 tablet 3  . bicalutamide (CASODEX) 50 MG tablet Take 1 tablet (50 mg total) by mouth daily. 30 tablet 0  . budesonide-formoterol (SYMBICORT) 160-4.5 MCG/ACT inhaler Inhale 2 puffs into the lungs 2 (two) times daily.    . calcium-vitamin D (OSCAL WITH D) 500-200 MG-UNIT tablet Take 1 tablet by mouth daily with breakfast. 90 tablet 3  . finasteride (PROSCAR) 5 MG tablet Take 1 tablet (5 mg total) by mouth daily. 30 tablet 2  . Fluticasone-Umeclidin-Vilant (TRELEGY ELLIPTA) 100-62.5-25 MCG/INH AEPB Inhale 2 puffs into the lungs daily. 180 each 0  . gabapentin (NEURONTIN) 300 MG capsule Take 1 capsule (300 mg total) by mouth at bedtime. 30 capsule 2  . hydrochlorothiazide (MICROZIDE) 12.5 MG capsule Take 1 capsule (12.5 mg total) by mouth daily. 90 capsule 1  . lisinopril (ZESTRIL) 20 MG tablet Take 20 mg by mouth daily.    . methylPREDNISolone (MEDROL DOSEPAK) 4 MG TBPK tablet Take by mouth.    . oxyCODONE (OXY IR/ROXICODONE) 5 MG immediate release tablet Take 1 tablet (5 mg  total) by mouth every 4 (four) hours as needed for severe pain. 180 tablet 0  . oxyCODONE (OXYCONTIN) 30 MG 12 hr tablet Take 1 tablet (30 mg total) by mouth every 12 (twelve) hours. 60 tablet 0  . pantoprazole (PROTONIX) 40 MG tablet Take 1 tablet (40 mg total) by mouth daily. 90 tablet 3  . predniSONE (DELTASONE) 5 MG tablet Take 1 tablet (5 mg total) by mouth daily with breakfast. (do not take prior to starting Abiraterone) 90 tablet 1  . sildenafil (VIAGRA) 50 MG tablet Take 1 tablet (50 mg total) by mouth daily as needed for erectile dysfunction. 10 tablet 0  . tadalafil (CIALIS) 10 MG tablet Take 1 tablet (10 mg total) by mouth daily as needed for erectile dysfunction. 10 tablet 1  . tamsulosin (FLOMAX) 0.4 MG CAPS capsule Take 1 capsule (0.4 mg total) by mouth in the morning and at bedtime. 60 capsule 0  . traZODone (DESYREL) 50 MG tablet Take 1 tablet (50 mg total) by mouth at bedtime. 30 tablet 0   No current facility-administered medications for this encounter.    REVIEW OF SYSTEMS:  On review of systems, the patient reports that he is doing well overall. He denies any chest pain, shortness of breath, cough, fevers, chills, night sweats, unintended weight changes. He denies any bowel disturbances, and denies abdominal pain, nausea or vomiting. He reports constant pain across his buttocks, left worse than right, that radiates down his left leg with intermittent numbness and "freezing" in his toes. He denies focal weakness in the upper or lower extremities and denies any bowel or bladder dysfunction. A complete review of systems is obtained and is otherwise negative.    PHYSICAL EXAM:  Wt Readings from Last 3 Encounters:  12/17/19 143 lb 8 oz (65.1 kg)  12/08/19 141 lb 3.2 oz (64 kg)  11/05/19 138 lb 14.4 oz (63 kg)   Temp Readings from Last 3 Encounters:  12/17/19 (!) 97.2 F (36.2 C) (Temporal)  12/08/19 (!) 97 F (36.1 C) (Tympanic)  11/16/19 98 F (36.7 C) (Oral)   BP  Readings from Last 3 Encounters:  12/17/19 114/85  12/08/19 121/80  11/16/19 122/79   Pulse Readings from Last 3 Encounters:  12/17/19 (!) 102  12/08/19 (!) 115  11/16/19 89    /10  In general this is a well appearing African  American male in no acute distress. He's alert and oriented x4 and appropriate throughout the examination. Cardiopulmonary assessment is negative for acute distress and he exhibits normal effort.   KPS = 70  100 - Normal; no complaints; no evidence of disease. 90   - Able to carry on normal activity; minor signs or symptoms of disease. 80   - Normal activity with effort; some signs or symptoms of disease. 67   - Cares for self; unable to carry on normal activity or to do active work. 60   - Requires occasional assistance, but is able to care for most of his personal needs. 50   - Requires considerable assistance and frequent medical care. 63   - Disabled; requires special care and assistance. 26   - Severely disabled; hospital admission is indicated although death not imminent. 32   - Very sick; hospital admission necessary; active supportive treatment necessary. 10   - Moribund; fatal processes progressing rapidly. 0     - Dead  Karnofsky DA, Abelmann McArthur, Craver LS and [facility] [phone]) The use of the nitrogen mustards in the palliative treatment of carcinoma: with particular reference to bronchogenic carcinoma Cancer 1 634-56  LABORATORY DATA:  Lab Results  Component Value Date   WBC 5.8 12/08/2019   HGB 12.2 (L) 12/08/2019   HCT 36.8 (L) 12/08/2019   MCV 84.8 12/08/2019   PLT 420 (H) 12/08/2019   Lab Results  Component Value Date   NA 135 12/08/2019   K 3.7 12/08/2019   CL 101 12/08/2019   CO2 29 12/08/2019   Lab Results  Component Value Date   ALT 6 12/08/2019   AST 12 (L) 12/08/2019   ALKPHOS 75 12/08/2019   BILITOT 0.3 12/08/2019     RADIOGRAPHY: NM Bone Scan Whole Body  Result Date: 12/07/2019 CLINICAL DATA:  Prostate cancer,  assess response to treatment, PSA 44.8, having low back and LEFT buttock pain for 4 months EXAM: NUCLEAR MEDICINE WHOLE BODY BONE SCAN TECHNIQUE: Whole body anterior and posterior images were obtained approximately 3 hours after intravenous injection of radiopharmaceutical. RADIOPHARMACEUTICALS:  19.6 mCi Technetium-65m MDP IV COMPARISON:  None Correlation: CT chest abdomen and pelvis 12/02/2019 FINDINGS: Uptake at lateral LEFT seventh rib corresponding to healing fracture on CT. Uptake throughout upper and mid sacrum, corresponding to mixed lytic and bone destruction with displaced pathologic fracture involving the S1 segment. By CT this could represent either metastatic disease or radiation necrosis. Scintigraphically, favor metastatic disease with pathologic fracture; typically, slightly increased tracer uptake is seen during performance of radiation therapy though radiation necrosis usually demonstrates decreased activity, but a pathologic fracture could account for increased uptake. No other focal scintigraphic abnormalities identified. Expected urinary tract and soft tissue distribution of tracer. IMPRESSION: Significant tracer uptake throughout the sacrum corresponding to sacral bone destruction within the accompanying pathologic fracture through the S1 segment on CT, favor metastatic disease over radiation necrosis. Healing fracture of the lateral LEFT seventh rib. No other scintigraphic abnormalities. Electronically Signed   By: Lavonia Dana M.D.   On: 12/07/2019 09:39   CT CHEST ABDOMEN PELVIS W CONTRAST  Result Date: 12/03/2019 CLINICAL DATA:  Prostate cancer. Chemotherapy in progress. Radiation therapy complete. EXAM: CT CHEST, ABDOMEN, AND PELVIS WITH CONTRAST TECHNIQUE: Multidetector CT imaging of the chest, abdomen and pelvis was performed following the standard protocol during bolus administration of intravenous contrast. CONTRAST:  128mL OMNIPAQUE IOHEXOL 300 MG/ML  SOLN COMPARISON:  None.  FINDINGS: CT CHEST FINDINGS Cardiovascular: The heart  is normal in size. No pericardial effusion. There is mild tortuosity of thoracic aorta but no atherosclerotic calcifications or dissection. Age advanced three-vessel coronary artery calcifications are noted. The pulmonary arteries appear normal. Mediastinum/Nodes: Scattered mediastinal and hilar lymph nodes. No mass or overt adenopathy. The esophagus is grossly normal. The thyroid gland is unremarkable. Lungs/Pleura: Moderate to advanced emphysematous changes and pulmonary scarring. No worrisome pulmonary lesions and no pulmonary nodules to suggest metastatic disease. There is an 11 mm lesion in the right mainstem bronchus. It measures fluid attenuation and may be mucous or possibly a cyst. I would recommend a follow-up noncontrast chest CT in 3 months to reassess this. Musculoskeletal: No chest wall mass, supraclavicular or axillary adenopathy. The bony thorax is intact. No lytic or sclerotic bone lesions are identified. CT ABDOMEN PELVIS FINDINGS Hepatobiliary: No hepatic lesions to suggest metastatic disease. The gallbladder is normal. No common bile duct dilatation. The portal and hepatic veins are patent. Pancreas: No mass, inflammation or ductal dilatation. Spleen: Normal size.  No focal lesions. Adrenals/Urinary Tract: Adrenal glands and kidneys are unremarkable. Simple lower pole right renal cyst noted. The bladder is unremarkable. Stomach/Bowel: The stomach, duodenum, small bowel and colon are unremarkable. No acute inflammatory changes, mass lesions or obstructive findings. The terminal and appendix are normal. Vascular/Lymphatic: The aorta and branch vessels are patent. Age advanced atherosclerotic calcifications involving the distal aorta and iliac arteries no aneurysm or dissection. No mesenteric or retroperitoneal mass or lymphadenopathy. Reproductive: The prostate gland and seminal vesicles are grossly normal. No obvious mass. Other: No pelvic mass  or pelvic adenopathy. No inguinal adenopathy. No free pelvic fluid collections. Musculoskeletal: Extensive destructive bony changes involving the upper sacrum with a pathologic fracture of S1. Findings could be due to metastatic prostate cancer or possibly radiation necrosis. I do not see any other obvious bone lesions. Moderate degenerative changes involving both hips, right greater than left. IMPRESSION: 1. Extensive destructive bony changes involving the upper sacrum with a pathologic fracture of S1. Findings could be due to metastatic prostate cancer or possibly radiation necrosis. 2. No other obvious bone lesions. 3. No findings for metastatic disease involving the chest, abdomen or pelvis. 4. Moderate to advanced emphysematous changes and pulmonary scarring. 5. 11 mm lesion in the right mainstem bronchus, possibly mucous or possibly a cyst. Recommend follow-up noncontrast chest CT in 3 months to reassess. 6. Age advanced atherosclerotic calcifications involving the distal aorta and iliac arteries. 7. Age advanced three-vessel coronary artery calcifications. 8. Emphysema and aortic atherosclerosis. Aortic Atherosclerosis (ICD10-I70.0) and Emphysema (ICD10-J43.9). Electronically Signed   By: Marijo Sanes M.D.   On: 12/03/2019 12:28      IMPRESSION/PLAN:  1. 64 y.o. gentleman with oligometastatic castrate-sensitive prostate cancer, Gleason 4+4 with current PSA 27.3 on ADT. Today, we talked to the patient and his son about the findings and workup thus far. We discussed the natural history of oligometastatic prostate cancer and general treatment, highlighting the role of palliative radiotherapy in the management of painful osseous metastasis but also discussed the potential role for definitive prostate IMRT to improved the likelihood of more durable disease control, as based on evidence from the STAMPEDE trial. We discussed the available radiation techniques, and focused on the details and logistics of  delivery. We reviewed the anticipated acute and late sequelae associated with radiation in this setting. The patient was encouraged to ask questions that were answered to his satisfaction.  At the end of the conversation the patient is interested in moving forward with  a 4 week course of daily radiotherapy to the prostate and pelvic lymph nodes as well as inclusion of the painful osseous metastases in the lumbar spine and pelvis. He has freely signed written consent to proceed today in the office and a copy of this document will be placed in his medical record.  He is tentatively scheduled for CT SIM at 1:30pm on Wednesday 12/22/2019. We will share our discussion with Dr. Lorenso Courier and move forward with treatment planning accordingly. We enjoyed meeting him today and look forward to continuing to participate in his care.   [name], PA-C    Tyler Pita, MD  [facility] Direct Dial: [phone]  Fax: [phone] [email or web address]  Skype  [email or web address]  This document serves as a record of services personally performed by Tyler Pita, MD and Freeman Caldron, PA-C. It was created on their behalf by Wilburn Mylar, a trained medical scribe. The creation of this record is based on the scribe's personal observations and the provider's statements to them. This document has been checked and approved by the attending provider.

## 2019-12-20 ENCOUNTER — Other Ambulatory Visit: Payer: Medicare Other | Admitting: Hospice

## 2019-12-20 ENCOUNTER — Other Ambulatory Visit: Payer: Self-pay

## 2019-12-20 DIAGNOSIS — C61 Malignant neoplasm of prostate: Secondary | ICD-10-CM

## 2019-12-20 DIAGNOSIS — Z515 Encounter for palliative care: Secondary | ICD-10-CM

## 2019-12-20 DIAGNOSIS — C7951 Secondary malignant neoplasm of bone: Secondary | ICD-10-CM

## 2019-12-20 NOTE — Progress Notes (Signed)
[facility] Consult Note Telephone: [phone]  Fax: [phone]  PATIENT NAME: Dustin Alvarez DOB: 08-06-1955 MRN: [id number]  PRIMARY CARE PROVIDER:   [name], MD [name], MD [address],  [place] [address]  [place]: [name], MD [name], MD [address],  [place] [address]  RESPONSIBLE PARTY:   Self [phone] Emergency contact: Fredia Sorrow - son [phone] home phone  I met face to face with patient and family in home/facility.  RECOMMENDATIONS/PLAN:    Visit consisted of building trust and discussions on Palliative Medicine as specialized medical care for people living with serious illness, aimed at facilitating better quality of life through symptoms relief, assisting with advance care plan and establishing goals of care.   Visit consisted of counseling and education dealing with the complex and emotionally intense issues of symptom management and palliative care in the setting of serious and potentially life-threatening illness. Palliative care team will continue to support patient, patient's family, and medical team.  CODE STATUS: Extensive discussion on implications and ramifications of CODE STATUS patient elected to be a DO NOT RESUSCITATE.  NP signed a DNR form for patient; same document uploaded to epic today.  [name] was in agreement.  GOALS OF CARE: Goals of care include to maximize quality of life and symptom management.   Patient wishes to regain his strength and become more independent in his activities of daily living.  He is planning to go for radiation for treatment of his prostate cancer with metastasis to the bone.  Palliative care will continue to provide support to patient, family and the medical team.  Follow up Palliative Care Visit: Palliative care will continue to follow for goals of care clarification and  symptom management.   Follow-up in 6 weeks/as needed  Functional decline/Symptom Management: Patient reports Radiation is planned for  treatment for prostrate cancer with metastases to the bone. Patient was recently started on Abiraterone acetate by Oncologist 4 tabs a day; patient taking differently - 2 tabs a day. No adverse reactions; no nausea/vomiting. Education on need to take as directed.  He has his inhalers and is compliant with his medications for COPD. No recent exacerbation. He continues to smoke half a pack a day; education on smoking cessation.  Patient in no medical acuity with no complaints at this time.  Encouraged ongoing care. Palliative will continue to monitor for symptom management/decline and make recommendations as needed.   Family /Caregiver/Community Supports: Patient lives at home with his son Arsenio Katz, involved in his care.  He mentioned he is likely to move to an apartment. Patient recently moved to [place] from [place].  Spirituality and faith in God keep him going and hopeful  I spent 46 minutes providing this  consultation; time includes time spent with patient/family, chart review, provider coordination,  and documentation. More than 50% of the time in this consultation was spent on coordinating communication  CHIEF COMPLAIN/HISTORY OF PRESENT ILLNESS:  Dustin Alvarez is a 64 y.o. 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 Palliative Care was asked to follow this patient by consultation request of [name], MD to help address advance care planning and goals of care.  CODE STATUS: DNR  PPS: 60%   HOSPICE ELIGIBILITY/DIAGNOSIS: TBD  PAST MEDICAL HISTORY:  Past Medical History:  Diagnosis Date  . Asthma   . COPD (chronic obstructive pulmonary disease) (  [place])   . Hypertension   . Prostate cancer [facility])     SOCIAL HX:  Social History   Tobacco Use  . Smoking status:  Current Every Day Smoker    Packs/day: 1.00  . Smokeless tobacco: Never Used  . Tobacco comment: almost 1 pkd  Substance Use Topics  . Alcohol use: Yes    Comment: 1-2 drinks per week.    ALLERGIES:  Allergies  Allergen Reactions  . Ace Inhibitors Swelling    lisinopril     PERTINENT MEDICATIONS:  Outpatient Encounter Medications as of 12/20/2019  Medication Sig  . abiraterone acetate (ZYTIGA) 250 MG tablet Take 4 tablets (1,000 mg total) by mouth daily. Take on an empty stomach 1 hour before or 2 hours after a meal  . albuterol (VENTOLIN HFA) 108 (90 Base) MCG/ACT inhaler Inhale 2 puffs into the lungs every 6 (six) hours as needed for wheezing or shortness of breath.  Marland Kitchen amLODipine (NORVASC) 5 MG tablet Take 1 tablet (5 mg total) by mouth daily.  . bicalutamide (CASODEX) 50 MG tablet Take 1 tablet (50 mg total) by mouth daily.  . budesonide-formoterol (SYMBICORT) 160-4.5 MCG/ACT inhaler Inhale 2 puffs into the lungs 2 (two) times daily.  . calcium-vitamin D (OSCAL WITH D) 500-200 MG-UNIT tablet Take 1 tablet by mouth daily with breakfast.  . finasteride (PROSCAR) 5 MG tablet Take 1 tablet (5 mg total) by mouth daily.  . Fluticasone-Umeclidin-Vilant (TRELEGY ELLIPTA) 100-62.5-25 MCG/INH AEPB Inhale 2 puffs into the lungs daily.  Marland Kitchen gabapentin (NEURONTIN) 300 MG capsule Take 1 capsule (300 mg total) by mouth at bedtime.  . hydrochlorothiazide (MICROZIDE) 12.5 MG capsule Take 1 capsule (12.5 mg total) by mouth daily.  Marland Kitchen lisinopril (ZESTRIL) 20 MG tablet Take 20 mg by mouth daily.  . methylPREDNISolone (MEDROL DOSEPAK) 4 MG TBPK tablet Take by mouth.  . oxyCODONE (OXY IR/ROXICODONE) 5 MG immediate release tablet Take 1 tablet (5 mg total) by mouth every 4 (four) hours as needed for severe pain.  Marland Kitchen oxyCODONE (OXYCONTIN) 30 MG 12 hr tablet Take 1 tablet (30 mg total) by mouth every 12 (twelve) hours.  . pantoprazole (PROTONIX) 40 MG tablet Take 1 tablet (40 mg total) by mouth daily.  .  predniSONE (DELTASONE) 5 MG tablet Take 1 tablet (5 mg total) by mouth daily with breakfast. (do not take prior to starting Abiraterone)  . sildenafil (VIAGRA) 50 MG tablet Take 1 tablet (50 mg total) by mouth daily as needed for erectile dysfunction.  . tadalafil (CIALIS) 10 MG tablet Take 1 tablet (10 mg total) by mouth daily as needed for erectile dysfunction.  . tamsulosin (FLOMAX) 0.4 MG CAPS capsule Take 1 capsule (0.4 mg total) by mouth in the morning and at bedtime.  . traZODone (DESYREL) 50 MG tablet Take 1 tablet (50 mg total) by mouth at bedtime.   No facility-administered encounter medications on file as of 12/20/2019.    PHYSICAL EXAM/ROS:  Height:  6 feet  Weight:138 pounds BMI 18.84 kg/m General: In no acute distress, cooperative Cardiovascular: regular rate and rhythm, no chest pain reported Pulmonary: no cough, no shortness of breath, clear ant/post fields, normal respiratory effort Skin: No visible rash on exposed skin Abdomen: soft, non tender, positive bowel sounds in all quadrants Neurological: Weakness but otherwise nonfocal, Oriented x3,   Teodoro Spray, NP

## 2019-12-21 ENCOUNTER — Telehealth: Payer: Self-pay | Admitting: Radiation Oncology

## 2019-12-21 NOTE — Telephone Encounter (Signed)
Emailed NEW PATIENT ENROLLMENT FORM to [email or web address].

## 2019-12-22 ENCOUNTER — Ambulatory Visit
Admission: RE | Admit: 2019-12-22 | Discharge: 2019-12-22 | Disposition: A | Discharge status: Home / Self Care | Payer: Medicare Other | Source: Ambulatory Visit | Attending: Radiation Oncology | Admitting: Radiation Oncology

## 2019-12-22 ENCOUNTER — Other Ambulatory Visit: Payer: Self-pay

## 2019-12-22 DIAGNOSIS — Z192 Hormone resistant malignancy status: Secondary | ICD-10-CM | POA: Insufficient documentation

## 2019-12-22 DIAGNOSIS — C61 Malignant neoplasm of prostate: Secondary | ICD-10-CM | POA: Insufficient documentation

## 2019-12-22 DIAGNOSIS — C7951 Secondary malignant neoplasm of bone: Secondary | ICD-10-CM | POA: Insufficient documentation

## 2019-12-22 DIAGNOSIS — Z510 Encounter for antineoplastic radiation therapy: Secondary | ICD-10-CM | POA: Diagnosis not present

## 2019-12-23 ENCOUNTER — Encounter: Payer: Self-pay | Admitting: Medical Oncology

## 2019-12-23 NOTE — Progress Notes (Signed)
Left message for patient and spoke with his friend Dustin Alvarez,  to introduce myself as the prostate nurse navigator and discuss my role. He will begin radiation 11/22 for 4 weeks. The only barrier currently is transportation the days she can not bring him. The forms for transportation were completed by Sam and this has been arranged. I have her my contact information and asked her to call me with questions or concerns. I asked her to contact transportation the day before if she can not provide his ride. I gave her the number to transportation. She voiced understanding.

## 2019-12-26 NOTE — Progress Notes (Signed)
  Radiation Oncology         [phone]) [phone] ________________________________  Name: Dustin Alvarez MRN: [id number]  Date: 12/22/2019  DOB: 05-10-1955  SIMULATION AND TREATMENT PLANNING NOTE    ICD-10-CM   1. Prostate cancer metastatic to bone [facility])  C61    C79.51    DIAGNOSIS: 64 y.o. gentleman with oligometastatic castrate-sensitive prostate cancer, Gleason 4+4 with current PSA 27.3 on ADT with involvement of S1, L5 and L4.  NARRATIVE:  The patient was brought to the [facility].  Identity was confirmed.  All relevant records and images related to the planned course of therapy were reviewed.  The patient freely provided informed written consent to proceed with treatment after reviewing the details related to the planned course of therapy. The consent form was witnessed and verified by the simulation staff.  Then, the patient was set-up in a stable reproducible supine position for radiation therapy.  A vacuum lock pillow device was custom fabricated to position his legs in a reproducible immobilized position.  Then, I performed a urethrogram under sterile conditions to identify the prostatic apex.  CT images were obtained.  Surface markings were placed.  The CT images were loaded into the planning software.  Then the prostate target and avoidance structures including the rectum, bladder, bowel and hips were contoured.  Treatment planning then occurred.  The radiation prescription was entered and confirmed.  A total of one complex treatment devices was fabricated. I have requested : Intensity Modulated Radiotherapy (IMRT) is medically necessary for this case for the following reason:  Rectal sparing.Marland Kitchen  PLAN:  The patient will receive 60 Gy in 20 fractions to the prostate, proximal seminal vesicles, S1, L5 and L4.  ________________________________  Sheral Apley Tammi Klippel, M.D.

## 2019-12-27 ENCOUNTER — Other Ambulatory Visit: Payer: Self-pay | Admitting: Radiation Oncology

## 2019-12-27 ENCOUNTER — Telehealth: Payer: Self-pay | Admitting: *Deleted

## 2019-12-27 ENCOUNTER — Inpatient Hospital Stay: Payer: Medicare Other | Attending: Hematology and Oncology

## 2019-12-27 ENCOUNTER — Other Ambulatory Visit: Payer: Self-pay

## 2019-12-27 ENCOUNTER — Other Ambulatory Visit: Payer: Self-pay | Admitting: *Deleted

## 2019-12-27 ENCOUNTER — Other Ambulatory Visit: Payer: Self-pay | Admitting: Hematology and Oncology

## 2019-12-27 ENCOUNTER — Encounter: Payer: Self-pay | Admitting: Hematology and Oncology

## 2019-12-27 DIAGNOSIS — C7951 Secondary malignant neoplasm of bone: Secondary | ICD-10-CM

## 2019-12-27 DIAGNOSIS — N529 Male erectile dysfunction, unspecified: Secondary | ICD-10-CM | POA: Insufficient documentation

## 2019-12-27 DIAGNOSIS — C61 Malignant neoplasm of prostate: Secondary | ICD-10-CM

## 2019-12-27 DIAGNOSIS — Z79899 Other long term (current) drug therapy: Secondary | ICD-10-CM | POA: Diagnosis not present

## 2019-12-27 DIAGNOSIS — F17210 Nicotine dependence, cigarettes, uncomplicated: Secondary | ICD-10-CM | POA: Insufficient documentation

## 2019-12-27 LAB — CBC WITH DIFFERENTIAL (CANCER CENTER ONLY)
Abs Immature Granulocytes: 0.01 10*3/uL (ref 0.00–0.07)
Basophils Absolute: 0 10*3/uL (ref 0.0–0.1)
Basophils Relative: 1 %
Eosinophils Absolute: 0.2 10*3/uL (ref 0.0–0.5)
Eosinophils Relative: 2 %
HCT: 37.3 % — ABNORMAL LOW (ref 39.0–52.0)
Hemoglobin: 12.4 g/dL — ABNORMAL LOW (ref 13.0–17.0)
Immature Granulocytes: 0 %
Lymphocytes Relative: 18 %
Lymphs Abs: 1.1 10*3/uL (ref 0.7–4.0)
MCH: 27.9 pg (ref 26.0–34.0)
MCHC: 33.2 g/dL (ref 30.0–36.0)
MCV: 83.8 fL (ref 80.0–100.0)
Monocytes Absolute: 0.5 10*3/uL (ref 0.1–1.0)
Monocytes Relative: 8 %
Neutro Abs: 4.7 10*3/uL (ref 1.7–7.7)
Neutrophils Relative %: 71 %
Platelet Count: 357 10*3/uL (ref 150–400)
RBC: 4.45 MIL/uL (ref 4.22–5.81)
RDW: 13.1 % (ref 11.5–15.5)
WBC Count: 6.5 10*3/uL (ref 4.0–10.5)
nRBC: 0 % (ref 0.0–0.2)

## 2019-12-27 LAB — CMP (CANCER CENTER ONLY)
ALT: 6 U/L (ref 0–44)
AST: 12 U/L — ABNORMAL LOW (ref 15–41)
Albumin: 4 g/dL (ref 3.5–5.0)
Alkaline Phosphatase: 87 U/L (ref 38–126)
Anion gap: 12 (ref 5–15)
BUN: 17 mg/dL (ref 8–23)
CO2: 25 mmol/L (ref 22–32)
Calcium: 9.9 mg/dL (ref 8.9–10.3)
Chloride: 99 mmol/L (ref 98–111)
Creatinine: 0.99 mg/dL (ref 0.61–1.24)
GFR, Estimated: >60 mL/min (ref >60)
Glucose, Bld: 151 mg/dL — ABNORMAL HIGH (ref 70–99)
Potassium: 4.2 mmol/L (ref 3.5–5.1)
Sodium: 136 mmol/L (ref 135–145)
Total Bilirubin: 0.5 mg/dL (ref 0.3–1.2)
Total Protein: 7.9 g/dL (ref 6.5–8.1)

## 2019-12-27 MED ORDER — OXYCODONE HCL 5 MG PO TABS: 5.0000 mg | ORAL_TABLET | ORAL | 0 refills | Status: DC | PRN

## 2019-12-27 NOTE — Telephone Encounter (Signed)
Spoke with pt's daughter in the lobby. She states Dustin Alvarez needs refill of his oxycodone 5 mg tabs.   It needs to go to [facility]. It was last filled on 11/26/19 for 180 tabs

## 2019-12-27 NOTE — Progress Notes (Signed)
Met with patient/accompanying adult to introduce myself as Arboriculturist and to offer available resources.  Discussed one-time $1000 [place] and $200 Prostate grants to assist with personal expenses while going through treatment.  Gave them my card if interested in applying and for any additional financial questions or concerns.

## 2019-12-28 LAB — TESTOSTERONE: Testosterone: <3 ng/dL — ABNORMAL LOW (ref 264–916)

## 2019-12-28 LAB — PROSTATE-SPECIFIC AG, SERUM (LABCORP): Prostate Specific Ag, Serum: 21.2 ng/mL — ABNORMAL HIGH (ref 0.0–4.0)

## 2019-12-29 ENCOUNTER — Other Ambulatory Visit: Payer: Self-pay | Admitting: Hematology and Oncology

## 2019-12-29 DIAGNOSIS — Z510 Encounter for antineoplastic radiation therapy: Secondary | ICD-10-CM | POA: Diagnosis not present

## 2019-12-31 ENCOUNTER — Telehealth: Payer: Self-pay | Admitting: Internal Medicine

## 2019-12-31 NOTE — Telephone Encounter (Signed)
Name: Dustin Alvarez  MRN: [id number] DOB: 07-12-1955  The transport has been scheduled on 12/31/2019 as requested.     Dustin Alvarez DOB: February 04, 1956 MRN: [id number]   RIDER WAIVER AND RELEASE OF LIABILITY  For purposes of improving physical access to our facilities, [facility] is pleased to partner with third parties to provide [facility] patients or other authorized individuals the option of convenient, on-demand ground transportation services (the [place]") through use of the technology service that enables users to request on-demand ground transportation from independent third-party providers.  By opting to use and accept these [employer], I, the undersigned, hereby agree on behalf of myself, and on behalf of any minor child using the [employer] for whom I am the parent or legal guardian, as follows:  1. Government social research officer provided to me are provided by independent third-party transportation providers who are not [employer] or employees and who are unaffiliated with [employer]. 2. [facility] is neither a transportation carrier nor a common or public carrier. 3. [facility] has no control over the quality or safety of the transportation that occurs as a result of the [employer]. 4. [facility] cannot guarantee that any third-party transportation provider will complete any arranged transportation service. 5. [facility] makes no representation, warranty, or guarantee regarding the reliability, timeliness, quality, safety, suitability, or availability of any of the Transport Services or that they will be error free. 6. I fully understand that traveling by vehicle involves risks and dangers of serious bodily injury, including permanent disability, paralysis, and death. I agree, on behalf of myself and on behalf of any minor child using the Transport Services for whom I am the parent or legal guardian, that the entire risk arising out of my use of  the [employer] remains solely with me, to the maximum extent permitted under applicable law. 7. The [employer] are provided "as is" and "as available." [facility] disclaims all representations and warranties, express, implied or statutory, not expressly set out in these terms, including the implied warranties of merchantability and fitness for a particular purpose. 8. I hereby waive and release [facility], its agents, employees, officers, directors, representatives, insurers, attorneys, assigns, successors, subsidiaries, and affiliates from any and all past, present, or future claims, demands, liabilities, actions, causes of action, or suits of any kind directly or indirectly arising from acceptance and use of the [employer]. 9. I further waive and release [facility] and its affiliates from all present and future liability and responsibility for any injury or death to persons or damages to property caused by or related to the use of the [employer]. 10. I have read this Waiver and Release of Liability, and I understand the terms used in it and their legal significance. This Waiver is freely and voluntarily given with the understanding that my right (as well as the right of any minor child for whom I am the parent or legal guardian using the [employer]) to legal recourse against [facility] in connection with the [employer] is knowingly surrendered in return for use of these services.   I attest that I read the consent document to [name], gave Mr. Krammes the opportunity to ask questions and answered the questions asked (if any). I affirm that [name] then provided consent for he's participation in this program.     Cameron Proud

## 2020-01-03 ENCOUNTER — Other Ambulatory Visit: Payer: Self-pay

## 2020-01-03 ENCOUNTER — Ambulatory Visit
Admission: RE | Admit: 2020-01-03 | Discharge: 2020-01-03 | Disposition: A | Discharge status: Home / Self Care | Payer: Medicare Other | Source: Ambulatory Visit | Attending: Radiation Oncology | Admitting: Radiation Oncology

## 2020-01-03 DIAGNOSIS — Z510 Encounter for antineoplastic radiation therapy: Secondary | ICD-10-CM | POA: Diagnosis not present

## 2020-01-04 ENCOUNTER — Ambulatory Visit
Admission: RE | Admit: 2020-01-04 | Discharge: 2020-01-04 | Disposition: A | Discharge status: Home / Self Care | Payer: Medicare Other | Source: Ambulatory Visit | Attending: Radiation Oncology | Admitting: Radiation Oncology

## 2020-01-04 ENCOUNTER — Other Ambulatory Visit: Payer: Self-pay

## 2020-01-04 ENCOUNTER — Telehealth: Payer: Self-pay | Admitting: Radiation Oncology

## 2020-01-04 DIAGNOSIS — Z510 Encounter for antineoplastic radiation therapy: Secondary | ICD-10-CM | POA: Diagnosis not present

## 2020-01-04 MED FILL — ABIRATERONE ACETATE 250 MG: 250 | 30 days supply | Qty: 120 | Fill #0

## 2020-01-04 NOTE — Telephone Encounter (Signed)
Emailed signed Buyer, retail AND RELEASE OF LIABILITY for to [email or web address]

## 2020-01-05 ENCOUNTER — Ambulatory Visit
Admission: RE | Admit: 2020-01-05 | Discharge: 2020-01-05 | Disposition: A | Discharge status: Home / Self Care | Payer: Medicare Other | Source: Ambulatory Visit | Attending: Radiation Oncology | Admitting: Radiation Oncology

## 2020-01-05 DIAGNOSIS — Z510 Encounter for antineoplastic radiation therapy: Secondary | ICD-10-CM | POA: Diagnosis not present

## 2020-01-07 ENCOUNTER — Telehealth: Payer: Self-pay | Admitting: Internal Medicine

## 2020-01-07 DIAGNOSIS — I10 Essential (primary) hypertension: Secondary | ICD-10-CM

## 2020-01-07 DIAGNOSIS — K219 Gastro-esophageal reflux disease without esophagitis: Secondary | ICD-10-CM

## 2020-01-07 DIAGNOSIS — J449 Chronic obstructive pulmonary disease, unspecified: Secondary | ICD-10-CM

## 2020-01-07 MED ORDER — HYDROCHLOROTHIAZIDE 12.5 MG PO CAPS: 12.5000 mg | ORAL_CAPSULE | Freq: Every day | ORAL | 1 refills | Status: DC

## 2020-01-07 MED ORDER — TRAZODONE HCL 50 MG PO TABS
50.0000 mg | ORAL_TABLET | Freq: Every day | ORAL | 0 refills | Status: DC
Start: 2020-01-07 — End: 2020-02-08

## 2020-01-07 MED ORDER — TRELEGY ELLIPTA 100-62.5-25 MCG/INH IN AEPB: 2.0000 | INHALATION_SPRAY | Freq: Every day | RESPIRATORY_TRACT | 0 refills | Status: DC

## 2020-01-07 MED ORDER — AMLODIPINE BESYLATE 5 MG PO TABS: 5.0000 mg | ORAL_TABLET | Freq: Every day | ORAL | 3 refills | Status: DC

## 2020-01-07 MED ORDER — GABAPENTIN 300 MG PO CAPS
300.0000 mg | ORAL_CAPSULE | Freq: Every day | ORAL | 2 refills | Status: DC
Start: 2020-01-07 — End: 2020-02-24

## 2020-01-07 MED ORDER — PANTOPRAZOLE SODIUM 40 MG PO TBEC: 40.0000 mg | DELAYED_RELEASE_TABLET | Freq: Every day | ORAL | 3 refills | Status: DC

## 2020-01-07 MED ORDER — SILDENAFIL CITRATE 50 MG PO TABS
50.0000 mg | ORAL_TABLET | Freq: Every day | ORAL | 0 refills | Status: DC | PRN
Start: 2020-01-07 — End: 2020-02-24

## 2020-01-07 MED ORDER — TAMSULOSIN HCL 0.4 MG PO CAPS
0.4000 mg | ORAL_CAPSULE | Freq: Two times a day (BID) | ORAL | 0 refills | Status: DC
Start: 2020-01-07 — End: 2020-02-08

## 2020-01-07 MED ORDER — BUDESONIDE-FORMOTEROL FUMARATE 160-4.5 MCG/ACT IN AERO
2.0000 | INHALATION_SPRAY | Freq: Two times a day (BID) | RESPIRATORY_TRACT | 2 refills | Status: DC
Start: 2020-01-07 — End: 2020-03-08

## 2020-01-07 MED ORDER — ALBUTEROL SULFATE HFA 108 (90 BASE) MCG/ACT IN AERS: 2.0000 | INHALATION_SPRAY | Freq: Four times a day (QID) | RESPIRATORY_TRACT | 3 refills | Status: DC | PRN

## 2020-01-07 MED ORDER — CALCIUM CARBONATE-VITAMIN D 500-200 MG-UNIT PO TABS: 1.0000 | ORAL_TABLET | Freq: Every day | ORAL | 3 refills | Status: AC

## 2020-01-07 NOTE — Telephone Encounter (Signed)
  [facility] Telephone Encounter Continuity Care Appointment  HPI:   This telephone encounter was created for Dustin Alvarez on 01/07/2020 for the following purpose/cc medication refill.   Past Medical History:  Past Medical History:  Diagnosis Date  . Asthma   . COPD (chronic obstructive pulmonary disease) ([facility])   . Hypertension   . Prostate cancer ([facility])       ROS:      Assessment / Plan / Recommendations:   Please see A&P under problem oriented charting for assessment of the patient's acute and chronic medical conditions.   As always, pt is advised that if symptoms worsen or new symptoms arise, they should go to an urgent care facility or to to ER for further evaluation.   Consent and Medical Decision Making:    This is a telephone encounter between [name] and [facility] on 01/07/2020 for medication refill. The visit was conducted with the patient located at home and Harvie Heck at [facility]. The patient's identity was confirmed using their DOB and current address. The his/her legal guardian has consented to being evaluated through a telephone encounter and understands the associated risks (an examination cannot be done and the patient may need to come in for an appointment) / benefits (allows the patient to remain at home, decreasing exposure to coronavirus). I personally spent 7 minutes on medical discussion.    Patient's daughter is calling regarding medication refill. She notes that patient is out of all of his medications and is requesting refill on his antihypertensive, COPD, GERD and ED medications. She notes that patient gets his oxycodone from oncologist and is not requesting at this time. Refills sent to [facility].

## 2020-01-10 ENCOUNTER — Ambulatory Visit
Admission: RE | Admit: 2020-01-10 | Discharge: 2020-01-10 | Disposition: A | Discharge status: Home / Self Care | Payer: Medicare Other | Source: Ambulatory Visit | Attending: Radiation Oncology | Admitting: Radiation Oncology

## 2020-01-10 DIAGNOSIS — Z510 Encounter for antineoplastic radiation therapy: Secondary | ICD-10-CM | POA: Diagnosis not present

## 2020-01-11 ENCOUNTER — Telehealth: Payer: Self-pay | Admitting: *Deleted

## 2020-01-11 ENCOUNTER — Ambulatory Visit
Admission: RE | Admit: 2020-01-11 | Discharge: 2020-01-11 | Disposition: A | Discharge status: Home / Self Care | Payer: Medicare Other | Source: Ambulatory Visit | Attending: Radiation Oncology | Admitting: Radiation Oncology

## 2020-01-11 ENCOUNTER — Other Ambulatory Visit: Payer: Self-pay | Admitting: Hematology and Oncology

## 2020-01-11 ENCOUNTER — Other Ambulatory Visit: Payer: Self-pay

## 2020-01-11 DIAGNOSIS — Z510 Encounter for antineoplastic radiation therapy: Secondary | ICD-10-CM | POA: Diagnosis not present

## 2020-01-11 MED ORDER — OXYCODONE HCL ER 30 MG PO T12A
30.0000 mg | EXTENDED_RELEASE_TABLET | Freq: Two times a day (BID) | ORAL | 0 refills | Status: DC
Start: 2020-01-11 — End: 2020-02-08

## 2020-01-11 MED ORDER — OXYCODONE HCL 5 MG PO TABS: 5.0000 mg | ORAL_TABLET | ORAL | 0 refills | Status: DC | PRN

## 2020-01-11 MED FILL — OxyCONTIN 30 MG T12A: 30 | 30 days supply | Qty: 60 | Fill #0

## 2020-01-11 NOTE — Telephone Encounter (Signed)
Call back to friend, [name] to confirm if the refill needed is oxycodone or oxycontin. He reports it is the 30mg  Oxycontin he needs (has run out). Asked to speak w/patient but was told he was at his home. Calls come to him because "his phone does not work half the time". Needs refill to go to [facility]. Called Mr. Buffa on his mobile # and was able to confirm the Oxycontin 30 mg is what he needs. Informed him that Dr. Lorenso Courier is not here, but a stat message will go to him to refill this for him. He asked if OK to take 30 mg of the oxycodone now to make up for missed dose. This RN told him NO, it is released more immediately than the long acting and he could overdose and die. He agrees not to do that.

## 2020-01-11 NOTE — Telephone Encounter (Signed)
Patient's son called to see if his dad could get his oxycontin filled as he is in a very large amount of pain. I did not see oxycontin in his medication profile but I do see oxycodone.  He also wanted to see if his dad could take other pain medications in the meantime to subside his pain.  Refill request sent to Dr. Lorenso Courier and Jesse Fall, RN will call the patient's son back. Gardiner Rhyme, RN

## 2020-01-12 ENCOUNTER — Other Ambulatory Visit: Payer: Self-pay | Admitting: *Deleted

## 2020-01-12 ENCOUNTER — Ambulatory Visit
Admission: RE | Admit: 2020-01-12 | Discharge: 2020-01-12 | Disposition: A | Discharge status: Home / Self Care | Payer: Medicare Other | Source: Ambulatory Visit | Attending: Radiation Oncology | Admitting: Radiation Oncology

## 2020-01-12 DIAGNOSIS — Z510 Encounter for antineoplastic radiation therapy: Secondary | ICD-10-CM | POA: Insufficient documentation

## 2020-01-12 DIAGNOSIS — C7951 Secondary malignant neoplasm of bone: Secondary | ICD-10-CM | POA: Insufficient documentation

## 2020-01-12 DIAGNOSIS — C61 Malignant neoplasm of prostate: Secondary | ICD-10-CM | POA: Diagnosis present

## 2020-01-12 DIAGNOSIS — Z192 Hormone resistant malignancy status: Secondary | ICD-10-CM | POA: Diagnosis not present

## 2020-01-13 ENCOUNTER — Inpatient Hospital Stay: Payer: Medicare Other

## 2020-01-13 ENCOUNTER — Ambulatory Visit
Admission: RE | Admit: 2020-01-13 | Discharge: 2020-01-13 | Disposition: A | Discharge status: Home / Self Care | Payer: Medicare Other | Source: Ambulatory Visit | Attending: Radiation Oncology | Admitting: Radiation Oncology

## 2020-01-13 ENCOUNTER — Inpatient Hospital Stay: Payer: Medicare Other | Attending: Hematology and Oncology | Admitting: Hematology and Oncology

## 2020-01-13 ENCOUNTER — Encounter: Payer: Self-pay | Admitting: Hematology and Oncology

## 2020-01-13 ENCOUNTER — Other Ambulatory Visit: Payer: Self-pay

## 2020-01-13 ENCOUNTER — Other Ambulatory Visit: Payer: Self-pay | Admitting: Hematology and Oncology

## 2020-01-13 VITALS — BP 122/74 | HR 88 | Temp 97.2°F | Resp 17 | Ht 72.0 in | Wt 140.0 lb

## 2020-01-13 DIAGNOSIS — Z79899 Other long term (current) drug therapy: Secondary | ICD-10-CM | POA: Insufficient documentation

## 2020-01-13 DIAGNOSIS — C7951 Secondary malignant neoplasm of bone: Secondary | ICD-10-CM

## 2020-01-13 DIAGNOSIS — C61 Malignant neoplasm of prostate: Secondary | ICD-10-CM

## 2020-01-13 DIAGNOSIS — N529 Male erectile dysfunction, unspecified: Secondary | ICD-10-CM | POA: Diagnosis not present

## 2020-01-13 DIAGNOSIS — F17210 Nicotine dependence, cigarettes, uncomplicated: Secondary | ICD-10-CM | POA: Insufficient documentation

## 2020-01-13 DIAGNOSIS — Z510 Encounter for antineoplastic radiation therapy: Secondary | ICD-10-CM | POA: Diagnosis not present

## 2020-01-13 LAB — CMP (CANCER CENTER ONLY)
ALT: 10 U/L (ref 0–44)
AST: 15 U/L (ref 15–41)
Albumin: 4.1 g/dL (ref 3.5–5.0)
Alkaline Phosphatase: 90 U/L (ref 38–126)
Anion gap: 8 (ref 5–15)
BUN: 12 mg/dL (ref 8–23)
CO2: 29 mmol/L (ref 22–32)
Calcium: 9.8 mg/dL (ref 8.9–10.3)
Chloride: 102 mmol/L (ref 98–111)
Creatinine: 0.89 mg/dL (ref 0.61–1.24)
GFR, Estimated: >60 mL/min (ref >60)
Glucose, Bld: 110 mg/dL — ABNORMAL HIGH (ref 70–99)
Potassium: 3.4 mmol/L — ABNORMAL LOW (ref 3.5–5.1)
Sodium: 139 mmol/L (ref 135–145)
Total Bilirubin: 0.3 mg/dL (ref 0.3–1.2)
Total Protein: 8 g/dL (ref 6.5–8.1)

## 2020-01-13 LAB — CBC WITH DIFFERENTIAL (CANCER CENTER ONLY)
Abs Immature Granulocytes: 0.01 10*3/uL (ref 0.00–0.07)
Basophils Absolute: 0 10*3/uL (ref 0.0–0.1)
Basophils Relative: 0 %
Eosinophils Absolute: 0.2 10*3/uL (ref 0.0–0.5)
Eosinophils Relative: 5 %
HCT: 34.5 % — ABNORMAL LOW (ref 39.0–52.0)
Hemoglobin: 11.4 g/dL — ABNORMAL LOW (ref 13.0–17.0)
Immature Granulocytes: 0 %
Lymphocytes Relative: 20 %
Lymphs Abs: 0.7 10*3/uL (ref 0.7–4.0)
MCH: 27.6 pg (ref 26.0–34.0)
MCHC: 33 g/dL (ref 30.0–36.0)
MCV: 83.5 fL (ref 80.0–100.0)
Monocytes Absolute: 0.4 10*3/uL (ref 0.1–1.0)
Monocytes Relative: 11 %
Neutro Abs: 2.2 10*3/uL (ref 1.7–7.7)
Neutrophils Relative %: 64 %
Platelet Count: 280 10*3/uL (ref 150–400)
RBC: 4.13 MIL/uL — ABNORMAL LOW (ref 4.22–5.81)
RDW: 13.4 % (ref 11.5–15.5)
WBC Count: 3.4 10*3/uL — ABNORMAL LOW (ref 4.0–10.5)
nRBC: 0 % (ref 0.0–0.2)

## 2020-01-13 IMAGING — DX DG CHEST 2V
2 series · 2 of 2 positions shown · non-contrast
Comparison: 06/23/2006

CLINICAL DATA: Short of breath for 2 days, COPD, asthma, smoker

EXAM:
CHEST - 2 VIEW

[w chest pa]
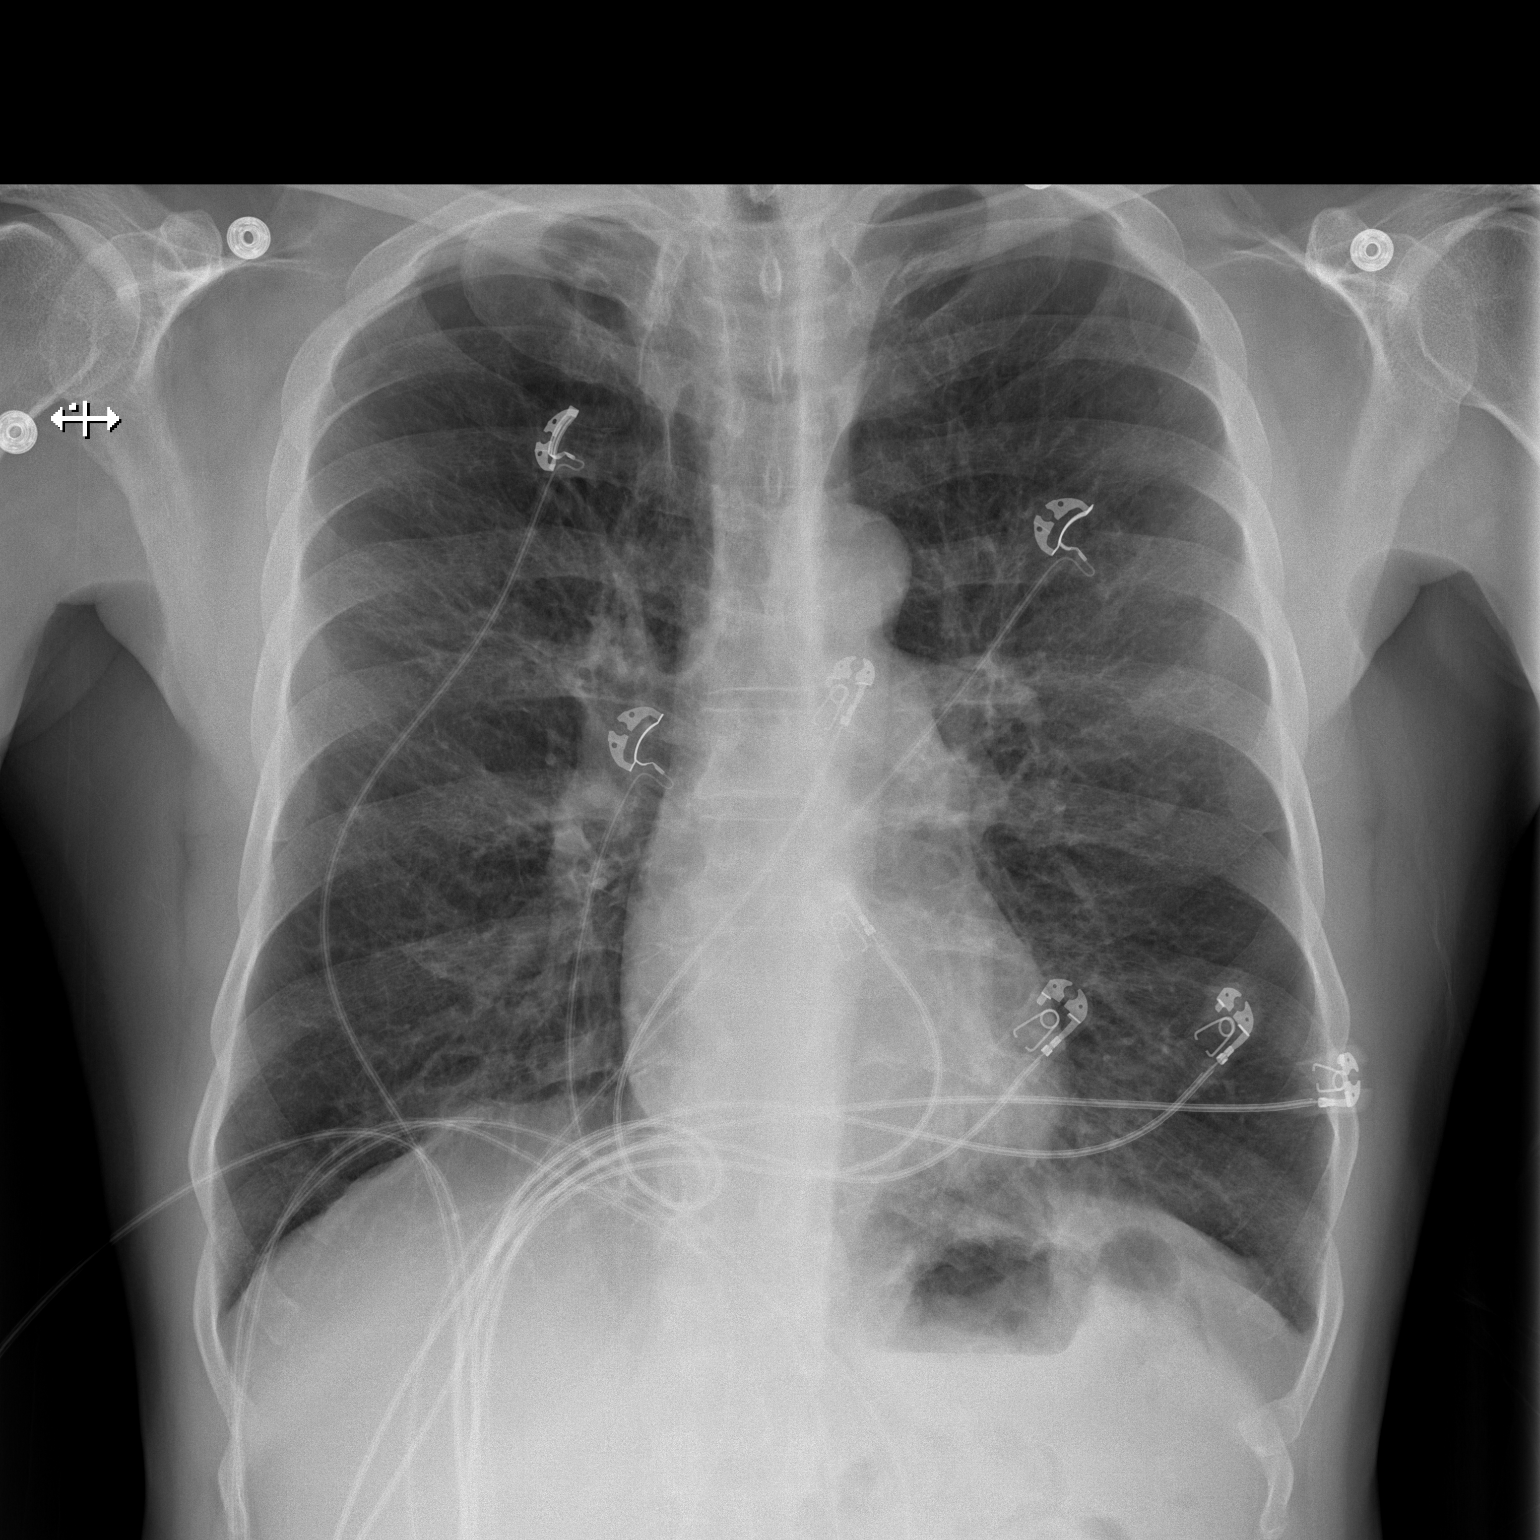

[w chest lat]
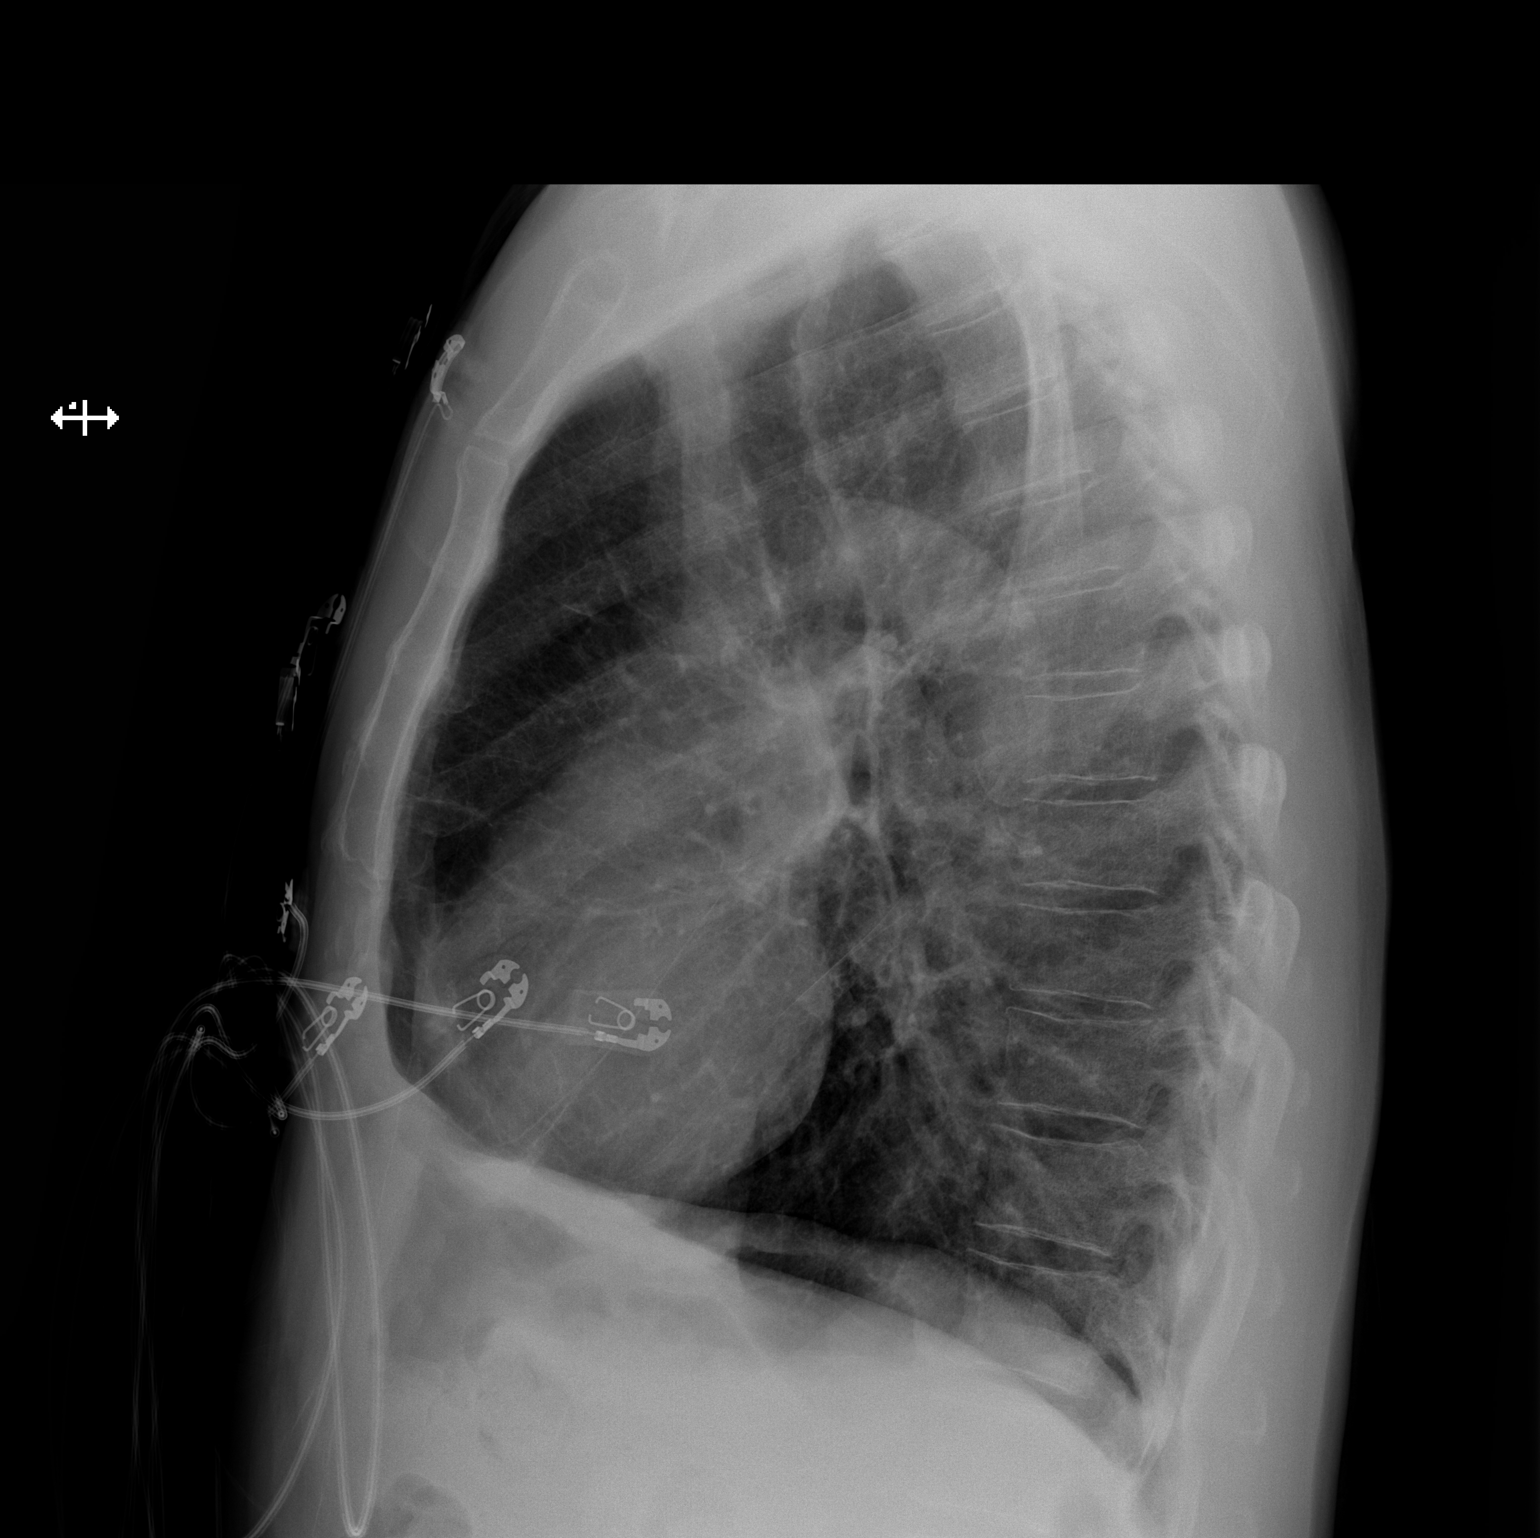

[2 of 2 positions shown; findings below may reference images not displayed]

FINDINGS: Normal heart size, mediastinal contours, and pulmonary vascularity.

Medial RIGHT apical scarring stable.

Lungs hyperinflated but clear.

No acute infiltrate, pleural effusion, or pneumothorax.

Osseous structures unremarkable.
IMPRESSION: COPD changes without acute infiltrate.

## 2020-01-13 MED ORDER — FINASTERIDE 5 MG PO TABS: 5.0000 mg | ORAL_TABLET | Freq: Every day | ORAL | 1 refills | Status: DC

## 2020-01-13 MED ORDER — SENNOSIDES-DOCUSATE SODIUM 8.6-50 MG PO TABS: 1.0000 | ORAL_TABLET | Freq: Two times a day (BID) | ORAL | 1 refills | Status: DC

## 2020-01-13 NOTE — Progress Notes (Signed)
[place] [phone]   [phone]  PROGRESS NOTE  Patient Care Team: [name], MD as PCP - General (Internal Medicine) Cira Rue, RN Nurse Navigator as Registered Nurse (Medical Oncology)  Hematological/Oncological History # Metastatic Castrate Sensitive Prostate Cancer, Metastatic to Bone 1) 07/13/2019: Abdomen/Pelvis CT extensive lytic changes with superimposed pathological fractures. Lytic change noted in the right iliac bone and new lucencies in the right T10 vertebrae.  2) 07/19/2019: biopsy of sacral mass shows metastatic prostatic adenocarcinoma, Gleason 4+4.  3) 07/2019: reportedly received Zometa 4g IV and eligard 22.5. Started on Casodex.  4) 6/10-6/16/2021: received palliative radiation to the sacrum at [facility]. Received 2000cGy over 6 days 5) Moved to [place]. Lost to follow up from [facility] in [place], [place]. 6) 11/05/2019: establish care with Dr. Lorenso Courier  7) 11/16/2019: Administered Zometa 4mg  IV and Lupron 22.5 mg  Interval History:  Dustin Alvarez 64 y.o. male with medical history significant for metastatic prostate cancer who presents for a follow up visit. The patient's last visit was on 12/08/2019. In the interim since the last visit he has been examined by radiation oncology and has started palliative radiation due to bone pain.    On exam today Dustin Alvarez reports that he has been well in the interim since his last visit.  He reports that radiation is going well and is not having any problems other than some occasional soreness for a few hours after he gets home from treatment.  He notes that there is been no significant improvement in the pain however.  He reports that he is currently taking his long-acting pain medication as well as oxycodone immediate release approximately twice per day.  He does endorse having some issues with constipation has requested a stool softener be called in for him.  On further  discussion he reports that he is only been taking 2 of the abiraterone pills per day.  He thought that the forcing like a lot.  I encouraged him to take the full dosage of this.  He also reports that the Cialis was taken once and he did not get the desired effect and therefore he stopped taking it.  He reports that the Cialis then by her approximately equivalent.  He notes that other than this everything else appears to be going all right.  He denies any fevers, chills, sweats, nausea vomiting or diarrhea.  A full 10 point ROS is listed below.  MEDICAL HISTORY:  Past Medical History:  Diagnosis Date  . Asthma   . COPD (chronic obstructive pulmonary disease) ([place])   . Hypertension   . Prostate cancer [facility])     SURGICAL HISTORY: Past Surgical History:  Procedure Laterality Date  . WRIST SURGERY Left 2017    SOCIAL HISTORY: Social History   Socioeconomic History  . Marital status: Legally Separated    Spouse name: Not on file  . Number of children: Not on file  . Years of education: Not on file  . Highest education level: Not on file  Occupational History  . Not on file  Tobacco Use  . Smoking status: Current Every Day Smoker    Packs/day: 1.00  . Smokeless tobacco: Never Used  . Tobacco comment: almost 1 pkd  Vaping Use  . Vaping Use: Never used  Substance and Sexual Activity  . Alcohol use: Yes    Comment: 1-2 drinks per week.  . Drug use: Never  . Sexual activity: Not Currently  Other Topics Concern  .  Not on file  Social History Narrative  . Not on file   Social Determinants of Health   Financial Resource Strain:   . Difficulty of Paying Living Expenses: Not on file  Food Insecurity:   . Worried About Charity fundraiser in the Last Year: Not on file  . Ran Out of Food in the Last Year: Not on file  Transportation Needs:   . Lack of Transportation (Medical): Not on file  . Lack of Transportation (Non-Medical): Not on file  Physical Activity:   . Days of  Exercise per Week: Not on file  . Minutes of Exercise per Session: Not on file  Stress:   . Feeling of Stress : Not on file  Social Connections:   . Frequency of Communication with Friends and Family: Not on file  . Frequency of Social Gatherings with Friends and Family: Not on file  . Attends Religious Services: Not on file  . Active Member of Clubs or Organizations: Not on file  . Attends Archivist Meetings: Not on file  . Marital Status: Not on file  Intimate Partner Violence:   . Fear of Current or Ex-Partner: Not on file  . Emotionally Abused: Not on file  . Physically Abused: Not on file  . Sexually Abused: Not on file    FAMILY HISTORY: Family History  Problem Relation Age of Onset  . COPD Father   . Diabetes Sister   . Diabetes Brother   . Breast cancer Neg Hx   . Prostate cancer Neg Hx   . Colon cancer Neg Hx   . Pancreatic cancer Neg Hx     ALLERGIES:  is allergic to ace inhibitors.  MEDICATIONS:  Current Outpatient Medications  Medication Sig Dispense Refill  . abiraterone acetate (ZYTIGA) 250 MG tablet TAKE 4 TABLETS (1,000 MG TOTAL) BY MOUTH DAILY. TAKE ON AN EMPTY STOMACH 1 HOUR BEFORE OR 2 HOURS AFTER A MEAL 120 tablet 0  . albuterol (VENTOLIN HFA) 108 (90 Base) MCG/ACT inhaler Inhale 2 puffs into the lungs every 6 (six) hours as needed for wheezing or shortness of breath. 8 g 3  . amLODipine (NORVASC) 5 MG tablet Take 1 tablet (5 mg total) by mouth daily. 90 tablet 3  . bicalutamide (CASODEX) 50 MG tablet Take 1 tablet (50 mg total) by mouth daily. 30 tablet 0  . budesonide-formoterol (SYMBICORT) 160-4.5 MCG/ACT inhaler Inhale 2 puffs into the lungs 2 (two) times daily. 1 each 2  . calcium-vitamin D (OSCAL WITH D) 500-200 MG-UNIT tablet Take 1 tablet by mouth daily with breakfast. 90 tablet 3  . finasteride (PROSCAR) 5 MG tablet Take 1 tablet (5 mg total) by mouth daily. 90 tablet 1  . Fluticasone-Umeclidin-Vilant (TRELEGY ELLIPTA) 100-62.5-25  MCG/INH AEPB Inhale 2 puffs into the lungs daily. 180 each 0  . gabapentin (NEURONTIN) 300 MG capsule Take 1 capsule (300 mg total) by mouth at bedtime. 30 capsule 2  . hydrochlorothiazide (MICROZIDE) 12.5 MG capsule Take 1 capsule (12.5 mg total) by mouth daily. 90 capsule 1  . lisinopril (ZESTRIL) 20 MG tablet Take 20 mg by mouth daily.    . methylPREDNISolone (MEDROL DOSEPAK) 4 MG TBPK tablet Take by mouth.    . oxyCODONE 30 MG 12 hr tablet Take 1 tablet (30 mg total) by mouth every 12 (twelve) hours. 60 tablet 0  . pantoprazole (PROTONIX) 40 MG tablet Take 1 tablet (40 mg total) by mouth daily. 90 tablet 3  . predniSONE (DELTASONE) 5  MG tablet Take 1 tablet (5 mg total) by mouth daily with breakfast. (do not take prior to starting Abiraterone) 90 tablet 1  . sildenafil (VIAGRA) 50 MG tablet Take 1 tablet (50 mg total) by mouth daily as needed for erectile dysfunction. 10 tablet 0  . tamsulosin (FLOMAX) 0.4 MG CAPS capsule Take 1 capsule (0.4 mg total) by mouth in the morning and at bedtime. 60 capsule 0  . traZODone (DESYREL) 50 MG tablet Take 1 tablet (50 mg total) by mouth at bedtime. 30 tablet 0   No current facility-administered medications for this visit.    REVIEW OF SYSTEMS:   Constitutional: ( - ) fevers, ( - )  chills , ( - ) night sweats Eyes: ( - ) blurriness of vision, ( - ) double vision, ( - ) watery eyes Ears, nose, mouth, throat, and face: ( - ) mucositis, ( - ) sore throat Respiratory: ( - ) cough, ( - ) dyspnea, ( - ) wheezes Cardiovascular: ( - ) palpitation, ( - ) chest discomfort, ( - ) lower extremity swelling Gastrointestinal:  ( - ) nausea, ( - ) heartburn, ( - ) change in bowel habits Skin: ( - ) abnormal skin rashes Lymphatics: ( - ) new lymphadenopathy, ( - ) easy bruising Neurological: ( - ) numbness, ( - ) tingling, ( - ) new weaknesses Behavioral/Psych: ( - ) mood change, ( - ) new changes  All other systems were reviewed with the patient and are  negative.  PHYSICAL EXAMINATION:  Vitals:   01/13/20 1525  BP: 122/74  Pulse: 88  Resp: 17  Temp: (!) 97.2 F (36.2 C)  SpO2: 100%   Filed Weights   01/13/20 1525  Weight: 140 lb (63.5 kg)    GENERAL: well appearing elderly African American male in NAD. SKIN: skin color, texture, turgor are normal, no rashes or significant lesions EYES: conjunctiva are pink and non-injected, sclera clear LUNGS: clear to auscultation and percussion with normal breathing effort HEART: regular rate & rhythm and no murmurs and no lower extremity edema ABDOMEN: soft, non-tender, non-distended, normal bowel sounds Musculoskeletal: no cyanosis of digits and no clubbing  PSYCH: alert & oriented x 3, fluent speech NEURO: no focal motor/sensory deficits  LABORATORY DATA:  I have reviewed the data as listed CBC Latest Ref Rng & Units 01/13/2020 12/27/2019 12/08/2019  WBC 4.0 - 10.5 K/uL 3.4(L) 6.5 5.8  Hemoglobin 13.0 - 17.0 g/dL 11.4(L) 12.4(L) 12.2(L)  Hematocrit 39 - 52 % 34.5(L) 37.3(L) 36.8(L)  Platelets 150 - 400 K/uL 280 357 420(H)    CMP Latest Ref Rng & Units 01/13/2020 12/27/2019 12/08/2019  Glucose 70 - 99 mg/dL 110(H) 151(H) 116(H)  BUN 8 - 23 mg/dL 12 17 16   Creatinine 0.61 - 1.24 mg/dL 0.89 0.99 0.83  Sodium 135 - 145 mmol/L 139 136 135  Potassium 3.5 - 5.1 mmol/L 3.4(L) 4.2 3.7  Chloride 98 - 111 mmol/L 102 99 101  CO2 22 - 32 mmol/L 29 25 29   Calcium 8.9 - 10.3 mg/dL 9.8 9.9 9.8  Total Protein 6.5 - 8.1 g/dL 8.0 7.9 8.0  Total Bilirubin 0.3 - 1.2 mg/dL 0.3 0.5 0.3  Alkaline Phos 38 - 126 U/L 90 87 75  AST 15 - 41 U/L 15 12(L) 12(L)  ALT 0 - 44 U/L 10 6 6     RADIOGRAPHIC STUDIES: No results found.  ASSESSMENT & PLAN Dustin Alvarez 64 y.o. male with medical history significant for metastatic prostate cancer who  presents for a follow up visit.  Dustin Alvarez has excessively transitioned off the Casodex and has received his first dosages of Zometa and Lupron.  His PSA  continues to trend downward and is testosterone is at castrate level.  Given this we started abiraterone therapy 1000 mg p.o. daily with 5 mg of prednisone.  In the interim he has also started palliative radiation therapy.   The major symptom the patient has been experiencing has been pain.  His spinal sacral lesion has been poorly controlled on oxycodone therapy.  He did previously receive radiation therapy in June 2021, but due to continued excruciating pain we asked for him to be reexamined by radiation oncology to see if there is any further intervention they can offer at this time. Recommend he continue OxyContin 30 mg twice daily and additionally continue his as needed fast acting oxycodone.  Due to starting abiraterone we will plan to have him back in 2 weeks for lab visit and in 4 weeks for clinic visit.  PSA 07/13/2019: PSA 819 08/27/2019: 226 11/05/2019: 44.8 12/08/2019: 27.3 12/27/2019: 21.2   # Metastatic Castrate Sensitive Prostate Cancer, Metastatic to Bone --findings are most consistent with metastatic adenocarcinoma of the prostate.  --testosterone at last visit was <3, PSA down to 21.2 ( from 819 at diagnosis)  --Administered Zometa 4mg  IV and Lupron 22.5 mg on 11/16/2019. Next due Jan 2022.  --continue abiraterone 1000mg  with prednisone 5mg  PO daily. Assure he is compliant with the full dose and steroids.  --RTC in 4 weeks with interval 2 week lab check on Abiraterone therapy   #Symptom Management --patient requested Cialis due to issues with ED (Viagra was ineffective). Unfortunately this was similiarly disappointing.  --gabapentin 300 mg nightly to help with pain.  --continue oxycodone 5-10mg  q4H PRN for pain control. --addition of oxycontin 30mg  q12H for basal pain control --established care with Radiation Oncology to help with painful bone lesions. Radiation therapy underway.   No orders of the defined types were placed in this encounter.   All questions were answered.  The patient knows to call the clinic with any problems, questions or concerns.  A total of more than 30 minutes were spent on this encounter and over half of that time was spent on counseling and coordination of care as outlined above.   Ledell Peoples, MD Department of Hematology/Oncology [place] at [facility] Phone: [phone] Pager: [phone] Email: [email or web address]  01/13/2020 3:41 PM

## 2020-01-14 ENCOUNTER — Ambulatory Visit: Payer: Medicare Other

## 2020-01-14 LAB — PROSTATE-SPECIFIC AG, SERUM (LABCORP): Prostate Specific Ag, Serum: 25.8 ng/mL — ABNORMAL HIGH (ref 0.0–4.0)

## 2020-01-14 LAB — TESTOSTERONE: Testosterone: <3 ng/dL — ABNORMAL LOW (ref 264–916)

## 2020-01-17 ENCOUNTER — Ambulatory Visit
Admission: RE | Admit: 2020-01-17 | Discharge: 2020-01-17 | Disposition: A | Discharge status: Home / Self Care | Payer: Medicare Other | Source: Ambulatory Visit | Attending: Radiation Oncology | Admitting: Radiation Oncology

## 2020-01-17 DIAGNOSIS — Z510 Encounter for antineoplastic radiation therapy: Secondary | ICD-10-CM | POA: Diagnosis not present

## 2020-01-18 ENCOUNTER — Telehealth: Payer: Self-pay | Admitting: Hematology and Oncology

## 2020-01-18 ENCOUNTER — Ambulatory Visit
Admission: RE | Admit: 2020-01-18 | Discharge: 2020-01-18 | Disposition: A | Discharge status: Home / Self Care | Payer: Medicare Other | Source: Ambulatory Visit | Attending: Radiation Oncology | Admitting: Radiation Oncology

## 2020-01-18 DIAGNOSIS — Z510 Encounter for antineoplastic radiation therapy: Secondary | ICD-10-CM | POA: Diagnosis not present

## 2020-01-18 NOTE — Telephone Encounter (Signed)
Scheduled per 12/2 los. Called and spoke with pt, confirmed added appts

## 2020-01-19 ENCOUNTER — Ambulatory Visit
Admission: RE | Admit: 2020-01-19 | Discharge: 2020-01-19 | Disposition: A | Discharge status: Home / Self Care | Payer: Medicare Other | Source: Ambulatory Visit | Attending: Radiation Oncology | Admitting: Radiation Oncology

## 2020-01-19 DIAGNOSIS — Z510 Encounter for antineoplastic radiation therapy: Secondary | ICD-10-CM | POA: Diagnosis not present

## 2020-01-20 ENCOUNTER — Ambulatory Visit
Admission: RE | Admit: 2020-01-20 | Discharge: 2020-01-20 | Disposition: A | Discharge status: Home / Self Care | Payer: Medicare Other | Source: Ambulatory Visit | Attending: Radiation Oncology | Admitting: Radiation Oncology

## 2020-01-20 DIAGNOSIS — Z510 Encounter for antineoplastic radiation therapy: Secondary | ICD-10-CM | POA: Diagnosis not present

## 2020-01-21 ENCOUNTER — Ambulatory Visit
Admission: RE | Admit: 2020-01-21 | Discharge: 2020-01-21 | Disposition: A | Discharge status: Home / Self Care | Payer: Medicare Other | Source: Ambulatory Visit | Attending: Radiation Oncology | Admitting: Radiation Oncology

## 2020-01-21 ENCOUNTER — Other Ambulatory Visit: Payer: Self-pay

## 2020-01-21 DIAGNOSIS — Z510 Encounter for antineoplastic radiation therapy: Secondary | ICD-10-CM | POA: Diagnosis not present

## 2020-01-24 ENCOUNTER — Ambulatory Visit
Admission: RE | Admit: 2020-01-24 | Discharge: 2020-01-24 | Disposition: A | Discharge status: Home / Self Care | Payer: Medicare Other | Source: Ambulatory Visit | Attending: Radiation Oncology | Admitting: Radiation Oncology

## 2020-01-24 DIAGNOSIS — Z510 Encounter for antineoplastic radiation therapy: Secondary | ICD-10-CM | POA: Diagnosis not present

## 2020-01-25 ENCOUNTER — Ambulatory Visit
Admission: RE | Admit: 2020-01-25 | Discharge: 2020-01-25 | Disposition: A | Discharge status: Home / Self Care | Payer: Medicare Other | Source: Ambulatory Visit | Attending: Radiation Oncology | Admitting: Radiation Oncology

## 2020-01-25 DIAGNOSIS — Z510 Encounter for antineoplastic radiation therapy: Secondary | ICD-10-CM | POA: Diagnosis not present

## 2020-01-26 ENCOUNTER — Other Ambulatory Visit: Payer: Self-pay | Admitting: Hematology and Oncology

## 2020-01-26 ENCOUNTER — Ambulatory Visit
Admission: RE | Admit: 2020-01-26 | Discharge: 2020-01-26 | Disposition: A | Discharge status: Home / Self Care | Payer: Medicare Other | Source: Ambulatory Visit | Attending: Radiation Oncology | Admitting: Radiation Oncology

## 2020-01-26 ENCOUNTER — Other Ambulatory Visit: Payer: Self-pay

## 2020-01-26 DIAGNOSIS — Z510 Encounter for antineoplastic radiation therapy: Secondary | ICD-10-CM | POA: Diagnosis not present

## 2020-01-27 ENCOUNTER — Ambulatory Visit
Admission: RE | Admit: 2020-01-27 | Discharge: 2020-01-27 | Disposition: A | Discharge status: Home / Self Care | Payer: Medicare Other | Source: Ambulatory Visit | Attending: Radiation Oncology | Admitting: Radiation Oncology

## 2020-01-27 ENCOUNTER — Inpatient Hospital Stay: Payer: Medicare Other

## 2020-01-27 ENCOUNTER — Other Ambulatory Visit: Payer: Self-pay | Admitting: Hematology and Oncology

## 2020-01-27 DIAGNOSIS — C7951 Secondary malignant neoplasm of bone: Secondary | ICD-10-CM

## 2020-01-27 DIAGNOSIS — Z510 Encounter for antineoplastic radiation therapy: Secondary | ICD-10-CM | POA: Diagnosis not present

## 2020-01-27 DIAGNOSIS — C61 Malignant neoplasm of prostate: Secondary | ICD-10-CM

## 2020-01-27 LAB — CMP (CANCER CENTER ONLY)
ALT: 9 U/L (ref 0–44)
AST: 12 U/L — ABNORMAL LOW (ref 15–41)
Albumin: 3.9 g/dL (ref 3.5–5.0)
Alkaline Phosphatase: 77 U/L (ref 38–126)
Anion gap: 7 (ref 5–15)
BUN: 10 mg/dL (ref 8–23)
CO2: 30 mmol/L (ref 22–32)
Calcium: 9.8 mg/dL (ref 8.9–10.3)
Chloride: 101 mmol/L (ref 98–111)
Creatinine: 0.88 mg/dL (ref 0.61–1.24)
GFR, Estimated: >60 mL/min (ref >60)
Glucose, Bld: 109 mg/dL — ABNORMAL HIGH (ref 70–99)
Potassium: 4.3 mmol/L (ref 3.5–5.1)
Sodium: 138 mmol/L (ref 135–145)
Total Bilirubin: 0.4 mg/dL (ref 0.3–1.2)
Total Protein: 7.5 g/dL (ref 6.5–8.1)

## 2020-01-27 LAB — CBC WITH DIFFERENTIAL (CANCER CENTER ONLY)
Abs Immature Granulocytes: 0.01 10*3/uL (ref 0.00–0.07)
Basophils Absolute: 0 10*3/uL (ref 0.0–0.1)
Basophils Relative: 0 %
Eosinophils Absolute: 0.1 10*3/uL (ref 0.0–0.5)
Eosinophils Relative: 2 %
HCT: 34 % — ABNORMAL LOW (ref 39.0–52.0)
Hemoglobin: 11.2 g/dL — ABNORMAL LOW (ref 13.0–17.0)
Immature Granulocytes: 0 %
Lymphocytes Relative: 13 %
Lymphs Abs: 0.4 10*3/uL — ABNORMAL LOW (ref 0.7–4.0)
MCH: 27.5 pg (ref 26.0–34.0)
MCHC: 32.9 g/dL (ref 30.0–36.0)
MCV: 83.3 fL (ref 80.0–100.0)
Monocytes Absolute: 0.4 10*3/uL (ref 0.1–1.0)
Monocytes Relative: 12 %
Neutro Abs: 2.4 10*3/uL (ref 1.7–7.7)
Neutrophils Relative %: 73 %
Platelet Count: 293 10*3/uL (ref 150–400)
RBC: 4.08 MIL/uL — ABNORMAL LOW (ref 4.22–5.81)
RDW: 13.9 % (ref 11.5–15.5)
WBC Count: 3.3 10*3/uL — ABNORMAL LOW (ref 4.0–10.5)
nRBC: 0 % (ref 0.0–0.2)

## 2020-01-28 ENCOUNTER — Ambulatory Visit
Admission: RE | Admit: 2020-01-28 | Discharge: 2020-01-28 | Disposition: A | Discharge status: Home / Self Care | Payer: Medicare Other | Source: Ambulatory Visit | Attending: Radiation Oncology | Admitting: Radiation Oncology

## 2020-01-28 DIAGNOSIS — Z510 Encounter for antineoplastic radiation therapy: Secondary | ICD-10-CM | POA: Diagnosis not present

## 2020-01-31 ENCOUNTER — Other Ambulatory Visit: Payer: Medicare Other | Admitting: Hospice

## 2020-01-31 ENCOUNTER — Ambulatory Visit
Admission: RE | Admit: 2020-01-31 | Discharge: 2020-01-31 | Disposition: A | Discharge status: Home / Self Care | Payer: Medicare Other | Source: Ambulatory Visit | Attending: Radiation Oncology | Admitting: Radiation Oncology

## 2020-01-31 DIAGNOSIS — C7951 Secondary malignant neoplasm of bone: Secondary | ICD-10-CM

## 2020-01-31 DIAGNOSIS — C61 Malignant neoplasm of prostate: Secondary | ICD-10-CM

## 2020-01-31 DIAGNOSIS — Z515 Encounter for palliative care: Secondary | ICD-10-CM

## 2020-01-31 DIAGNOSIS — Z510 Encounter for antineoplastic radiation therapy: Secondary | ICD-10-CM | POA: Diagnosis not present

## 2020-01-31 MED FILL — ABIRATERONE ACETATE 250 MG: 250 | 30 days supply | Qty: 120 | Fill #0

## 2020-01-31 NOTE — Progress Notes (Signed)
[place] Consult Note Telephone: [phone]  Fax: [phone]  PATIENT NAME: Dustin Alvarez DOB: 1955/04/19 MRN: [id number]  PRIMARY CARE PROVIDER:   [name], MD [name], MD [address] [place],  [place] [address]  [place]: [name], MD [name], MD [address] [place],  [place] [address]  RESPONSIBLE PARTY:   Self [phone] Emergency contact: Fredia Sorrow - son [phone] home phone  TELEHEALTH VISIT STATEMENT Due to the COVID-19 crisis, this visit was done via telephone from my office. It was initiated and consented to by this patient and/or family.  RECOMMENDATIONS/PLAN:  Telehealth visit to build trust and follow-up on palliative care.  CODE STATUS:  CODE STATUS reviewed.  Patient is a DO NOT RESUSCITATE.  GOALS OF CARE: Goals of care include to maximize quality of life and symptom management.   Patient is interested in hospice care in the future.  He had mentioned that his sister died with palliative and hospice service.    Follow up Palliative Care Visit: Palliative care will continue to follow for goals of care clarification and symptom management.   Follow-up in 6 weeks for further discussions on goals of care clarification using medical orders for scope of treatment.  Symptom Management:  Patient on a 20-day radiation treatments for prostrate cancer with metastases to the bone; last radiation is this week  Wednesday.  He reports no nausea vomiting, diarrhea, fatigue; tolerating radiation treatment well. Patient is ambulatory; Uses cane and rolling walker.  He is fairly independent in his activities of daily living. Education on the need for radiation treatment and the benefit of sticking to the schedule and completing it as ordered. Patient in no medical acuity with no complaints at this time.  Encouraged ongoing care. Palliative will  continue to monitor for symptom management/decline and make recommendations as needed.  Family /Caregiver/Community Supports:  Patient lives independently at home. Patient recently moved to [place] from [place].  Spirituality and faith in God keep him going and hopeful  I spent 30 minutes providing this consultation; time includes time spent with patient/family, chart review, provider coordination,  and documentation. More than 50% of the time in this consultation was spent on coordinating communication  CHIEF COMPLAIN/HISTORY OF PRESENT ILLNESS:  Dustin Alvarez is a 64 y.o. 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 Palliative Care was asked to follow this patient by consultation request of [name], MD to help address advance care planning and goals of care.  CODE STATUS: DNR  HOSPICE ELIGIBILITY/DIAGNOSIS: TBD  PAST MEDICAL HISTORY:  Past Medical History:  Diagnosis Date  . Asthma   . COPD (chronic obstructive pulmonary disease) ([place])   . Hypertension   . Prostate cancer [facility])     SOCIAL HX:  Social History   Tobacco Use  . Smoking status: Current Every Day Smoker    Packs/day: 1.00  . Smokeless tobacco: Never Used  . Tobacco comment: almost 1 pkd  Substance Use Topics  . Alcohol use: Yes    Comment: 1-2 drinks per week.    ALLERGIES:  Allergies  Allergen Reactions  . Ace Inhibitors Swelling    lisinopril     PERTINENT MEDICATIONS:  Outpatient Encounter Medications as of 01/31/2020  Medication Sig  . abiraterone acetate (ZYTIGA) 250 MG tablet TAKE 4 TABLETS (1,000 MG TOTAL) BY MOUTH DAILY. TAKE ON AN EMPTY STOMACH 1 HOUR BEFORE  OR 2 HOURS AFTER A MEAL  . albuterol (VENTOLIN HFA) 108 (90 Base) MCG/ACT inhaler Inhale 2 puffs into the lungs every 6 (six) hours as needed for wheezing or shortness of breath.  Marland Kitchen amLODipine (NORVASC) 5 MG tablet Take 1 tablet (5 mg total) by mouth daily.   . bicalutamide (CASODEX) 50 MG tablet Take 1 tablet (50 mg total) by mouth daily.  . budesonide-formoterol (SYMBICORT) 160-4.5 MCG/ACT inhaler Inhale 2 puffs into the lungs 2 (two) times daily.  . calcium-vitamin D (OSCAL WITH D) 500-200 MG-UNIT tablet Take 1 tablet by mouth daily with breakfast.  . finasteride (PROSCAR) 5 MG tablet Take 1 tablet (5 mg total) by mouth daily.  . Fluticasone-Umeclidin-Vilant (TRELEGY ELLIPTA) 100-62.5-25 MCG/INH AEPB Inhale 2 puffs into the lungs daily.  Marland Kitchen gabapentin (NEURONTIN) 300 MG capsule Take 1 capsule (300 mg total) by mouth at bedtime.  . hydrochlorothiazide (MICROZIDE) 12.5 MG capsule Take 1 capsule (12.5 mg total) by mouth daily.  Marland Kitchen lisinopril (ZESTRIL) 20 MG tablet Take 20 mg by mouth daily.  . methylPREDNISolone (MEDROL DOSEPAK) 4 MG TBPK tablet Take by mouth.  . oxyCODONE 30 MG 12 hr tablet Take 1 tablet (30 mg total) by mouth every 12 (twelve) hours.  . pantoprazole (PROTONIX) 40 MG tablet Take 1 tablet (40 mg total) by mouth daily.  . predniSONE (DELTASONE) 5 MG tablet Take 1 tablet (5 mg total) by mouth daily with breakfast. (do not take prior to starting Abiraterone)  . senna-docusate (SENOKOT-S) 8.6-50 MG tablet Take 1 tablet by mouth 2 (two) times daily.  . sildenafil (VIAGRA) 50 MG tablet Take 1 tablet (50 mg total) by mouth daily as needed for erectile dysfunction.  . tamsulosin (FLOMAX) 0.4 MG CAPS capsule Take 1 capsule (0.4 mg total) by mouth in the morning and at bedtime.  . traZODone (DESYREL) 50 MG tablet Take 1 tablet (50 mg total) by mouth at bedtime.   No facility-administered encounter medications on file as of 01/31/2020.    Note:  Portions of this note were generated with Lobbyist. Dictation errors may occur despite attempts at proofreading.  Teodoro Spray, NP

## 2020-02-01 ENCOUNTER — Other Ambulatory Visit: Payer: Self-pay

## 2020-02-01 ENCOUNTER — Ambulatory Visit: Payer: Medicare Other

## 2020-02-01 ENCOUNTER — Ambulatory Visit
Admission: RE | Admit: 2020-02-01 | Discharge: 2020-02-01 | Disposition: A | Discharge status: Home / Self Care | Payer: Medicare Other | Source: Ambulatory Visit | Attending: Radiation Oncology | Admitting: Radiation Oncology

## 2020-02-01 DIAGNOSIS — Z510 Encounter for antineoplastic radiation therapy: Secondary | ICD-10-CM | POA: Diagnosis not present

## 2020-02-02 ENCOUNTER — Ambulatory Visit
Admission: RE | Admit: 2020-02-02 | Discharge: 2020-02-02 | Disposition: A | Discharge status: Home / Self Care | Payer: Medicare Other | Source: Ambulatory Visit | Attending: Radiation Oncology | Admitting: Radiation Oncology

## 2020-02-02 ENCOUNTER — Encounter: Payer: Self-pay | Admitting: Radiation Oncology

## 2020-02-02 DIAGNOSIS — Z510 Encounter for antineoplastic radiation therapy: Secondary | ICD-10-CM | POA: Diagnosis not present

## 2020-02-08 ENCOUNTER — Telehealth: Payer: Self-pay | Admitting: *Deleted

## 2020-02-08 ENCOUNTER — Other Ambulatory Visit: Payer: Self-pay

## 2020-02-08 ENCOUNTER — Other Ambulatory Visit: Payer: Self-pay | Admitting: Nurse Practitioner

## 2020-02-08 DIAGNOSIS — C61 Malignant neoplasm of prostate: Secondary | ICD-10-CM

## 2020-02-08 DIAGNOSIS — J449 Chronic obstructive pulmonary disease, unspecified: Secondary | ICD-10-CM

## 2020-02-08 DIAGNOSIS — C7951 Secondary malignant neoplasm of bone: Secondary | ICD-10-CM

## 2020-02-08 MED ORDER — TRAZODONE HCL 50 MG PO TABS: 50.0000 mg | ORAL_TABLET | Freq: Every day | ORAL | 0 refills | Status: DC

## 2020-02-08 MED ORDER — TAMSULOSIN HCL 0.4 MG PO CAPS: 0.4000 mg | ORAL_CAPSULE | Freq: Two times a day (BID) | ORAL | 0 refills | Status: DC

## 2020-02-08 MED ORDER — TRELEGY ELLIPTA 100-62.5-25 MCG/INH IN AEPB: 2.0000 | INHALATION_SPRAY | Freq: Every day | RESPIRATORY_TRACT | 0 refills | Status: DC

## 2020-02-08 MED ORDER — OXYCODONE HCL ER 30 MG PO T12A: 30.0000 mg | EXTENDED_RELEASE_TABLET | Freq: Two times a day (BID) | ORAL | 0 refills | Status: DC

## 2020-02-08 MED FILL — OxyCONTIN 30 MG T12A: 30 | 30 days supply | Qty: 60 | Fill #0

## 2020-02-08 NOTE — Telephone Encounter (Signed)
Patient's daughter called - stated father out of Oxycodone. She would like prescription sent to [facility] so that she can pick up today after 4pm when she gets off work. Message given to On call MD in Dr. Derek Mound absence.

## 2020-02-08 NOTE — Telephone Encounter (Signed)
Pt daughter is requesting a refill of his tamsulosin (FLOMAX) 0.4 MG CAPS capsule & bicalutamide (CASODEX) 50 MG tablet &   traZODone (DESYREL) 50 MG tablet to be sent  [facility] - [place], [place] - [address] Phone:  [phone]  Fax:  [phone]      she is also requesting any other medication that is needed to be refilled  She is at work and cant remember if there is any others .Marland Kitchen

## 2020-02-08 NOTE — Telephone Encounter (Signed)
Appears he was changed to Abitaterone and is no longer on Casodex based on last Oncology note.  Would confirm medication prior to fill and would prefer Oncology office to fill.  Others appear appropriate and I will refill.

## 2020-02-09 ENCOUNTER — Other Ambulatory Visit: Payer: Self-pay | Admitting: *Deleted

## 2020-02-10 ENCOUNTER — Other Ambulatory Visit: Payer: Self-pay

## 2020-02-10 ENCOUNTER — Other Ambulatory Visit: Payer: Self-pay | Admitting: *Deleted

## 2020-02-10 ENCOUNTER — Inpatient Hospital Stay: Payer: Medicare Other

## 2020-02-10 DIAGNOSIS — C61 Malignant neoplasm of prostate: Secondary | ICD-10-CM

## 2020-02-10 DIAGNOSIS — C7951 Secondary malignant neoplasm of bone: Secondary | ICD-10-CM

## 2020-02-10 LAB — CBC WITH DIFFERENTIAL (CANCER CENTER ONLY)
Abs Immature Granulocytes: 0 10*3/uL (ref 0.00–0.07)
Basophils Absolute: 0 10*3/uL (ref 0.0–0.1)
Basophils Relative: 0 %
Eosinophils Absolute: 0 10*3/uL (ref 0.0–0.5)
Eosinophils Relative: 1 %
HCT: 34.9 % — ABNORMAL LOW (ref 39.0–52.0)
Hemoglobin: 11.9 g/dL — ABNORMAL LOW (ref 13.0–17.0)
Immature Granulocytes: 0 %
Lymphocytes Relative: 11 %
Lymphs Abs: 0.4 10*3/uL — ABNORMAL LOW (ref 0.7–4.0)
MCH: 27.2 pg (ref 26.0–34.0)
MCHC: 34.1 g/dL (ref 30.0–36.0)
MCV: 79.7 fL — ABNORMAL LOW (ref 80.0–100.0)
Monocytes Absolute: 0.3 10*3/uL (ref 0.1–1.0)
Monocytes Relative: 10 %
Neutro Abs: 2.6 10*3/uL (ref 1.7–7.7)
Neutrophils Relative %: 78 %
Platelet Count: 282 10*3/uL (ref 150–400)
RBC: 4.38 MIL/uL (ref 4.22–5.81)
RDW: 13.4 % (ref 11.5–15.5)
WBC Count: 3.3 10*3/uL — ABNORMAL LOW (ref 4.0–10.5)
nRBC: 0 % (ref 0.0–0.2)

## 2020-02-10 LAB — CMP (CANCER CENTER ONLY)
ALT: 10 U/L (ref 0–44)
AST: 12 U/L — ABNORMAL LOW (ref 15–41)
Albumin: 3.7 g/dL (ref 3.5–5.0)
Alkaline Phosphatase: 68 U/L (ref 38–126)
Anion gap: 6 (ref 5–15)
BUN: 9 mg/dL (ref 8–23)
CO2: 31 mmol/L (ref 22–32)
Calcium: 9.3 mg/dL (ref 8.9–10.3)
Chloride: 99 mmol/L (ref 98–111)
Creatinine: 0.81 mg/dL (ref 0.61–1.24)
GFR, Estimated: >60 mL/min (ref >60)
Glucose, Bld: 141 mg/dL — ABNORMAL HIGH (ref 70–99)
Potassium: 3 mmol/L — ABNORMAL LOW (ref 3.5–5.1)
Sodium: 136 mmol/L (ref 135–145)
Total Bilirubin: 0.4 mg/dL (ref 0.3–1.2)
Total Protein: 7.3 g/dL (ref 6.5–8.1)

## 2020-02-22 ENCOUNTER — Other Ambulatory Visit: Payer: Self-pay | Admitting: Hematology and Oncology

## 2020-02-24 ENCOUNTER — Other Ambulatory Visit: Payer: Self-pay | Admitting: Hematology and Oncology

## 2020-02-24 ENCOUNTER — Inpatient Hospital Stay: Payer: Medicare Other | Attending: Hematology and Oncology | Admitting: Hematology and Oncology

## 2020-02-24 ENCOUNTER — Inpatient Hospital Stay: Payer: Medicare Other

## 2020-02-24 ENCOUNTER — Other Ambulatory Visit: Payer: Self-pay

## 2020-02-24 VITALS — BP 122/78 | HR 103 | Temp 98.1°F | Resp 15 | Ht 72.0 in | Wt 139.8 lb

## 2020-02-24 DIAGNOSIS — N529 Male erectile dysfunction, unspecified: Secondary | ICD-10-CM | POA: Diagnosis not present

## 2020-02-24 DIAGNOSIS — C61 Malignant neoplasm of prostate: Secondary | ICD-10-CM

## 2020-02-24 DIAGNOSIS — F17210 Nicotine dependence, cigarettes, uncomplicated: Secondary | ICD-10-CM | POA: Diagnosis not present

## 2020-02-24 DIAGNOSIS — Z79899 Other long term (current) drug therapy: Secondary | ICD-10-CM | POA: Diagnosis not present

## 2020-02-24 DIAGNOSIS — C7951 Secondary malignant neoplasm of bone: Secondary | ICD-10-CM | POA: Diagnosis present

## 2020-02-24 DIAGNOSIS — N522 Drug-induced erectile dysfunction: Secondary | ICD-10-CM | POA: Diagnosis not present

## 2020-02-24 LAB — CMP (CANCER CENTER ONLY)
ALT: 7 U/L (ref 0–44)
AST: 11 U/L — ABNORMAL LOW (ref 15–41)
Albumin: 3.9 g/dL (ref 3.5–5.0)
Alkaline Phosphatase: 80 U/L (ref 38–126)
Anion gap: 10 (ref 5–15)
BUN: 9 mg/dL (ref 8–23)
CO2: 29 mmol/L (ref 22–32)
Calcium: 9.7 mg/dL (ref 8.9–10.3)
Chloride: 101 mmol/L (ref 98–111)
Creatinine: 0.81 mg/dL (ref 0.61–1.24)
GFR, Estimated: >60 mL/min (ref >60)
Glucose, Bld: 111 mg/dL — ABNORMAL HIGH (ref 70–99)
Potassium: 3.6 mmol/L (ref 3.5–5.1)
Sodium: 140 mmol/L (ref 135–145)
Total Bilirubin: 0.4 mg/dL (ref 0.3–1.2)
Total Protein: 7.7 g/dL (ref 6.5–8.1)

## 2020-02-24 LAB — CBC WITH DIFFERENTIAL (CANCER CENTER ONLY)
Abs Immature Granulocytes: 0.02 10*3/uL (ref 0.00–0.07)
Basophils Absolute: 0 10*3/uL (ref 0.0–0.1)
Basophils Relative: 0 %
Eosinophils Absolute: 0 10*3/uL (ref 0.0–0.5)
Eosinophils Relative: 0 %
HCT: 34.9 % — ABNORMAL LOW (ref 39.0–52.0)
Hemoglobin: 11.5 g/dL — ABNORMAL LOW (ref 13.0–17.0)
Immature Granulocytes: 1 %
Lymphocytes Relative: 10 %
Lymphs Abs: 0.4 10*3/uL — ABNORMAL LOW (ref 0.7–4.0)
MCH: 27.3 pg (ref 26.0–34.0)
MCHC: 33 g/dL (ref 30.0–36.0)
MCV: 82.9 fL (ref 80.0–100.0)
Monocytes Absolute: 0.5 10*3/uL (ref 0.1–1.0)
Monocytes Relative: 13 %
Neutro Abs: 2.9 10*3/uL (ref 1.7–7.7)
Neutrophils Relative %: 76 %
Platelet Count: 269 10*3/uL (ref 150–400)
RBC: 4.21 MIL/uL — ABNORMAL LOW (ref 4.22–5.81)
RDW: 15 % (ref 11.5–15.5)
WBC Count: 3.8 10*3/uL — ABNORMAL LOW (ref 4.0–10.5)
nRBC: 0 % (ref 0.0–0.2)

## 2020-02-24 MED ORDER — GABAPENTIN 300 MG PO CAPS: 300.0000 mg | ORAL_CAPSULE | Freq: Two times a day (BID) | ORAL | 2 refills | Status: DC

## 2020-02-24 MED ORDER — SILDENAFIL CITRATE 50 MG PO TABS: 50.0000 mg | ORAL_TABLET | Freq: Every day | ORAL | 0 refills | Status: DC | PRN

## 2020-02-24 MED ORDER — OXYCODONE HCL ER 40 MG PO T12A
40.0000 mg | EXTENDED_RELEASE_TABLET | Freq: Two times a day (BID) | ORAL | 0 refills | Status: DC
Start: 2020-02-24 — End: 2020-03-03

## 2020-02-24 MED FILL — SILDENAFIL CITRATE 50 MG TA: 50 | 30 days supply | Qty: 10 | Fill #0

## 2020-02-24 MED FILL — OxyCONTIN 40 MG T12A: 40 | 7 days supply | Qty: 14 | Fill #0

## 2020-02-24 MED FILL — GABAPENTIN 300 MG CAPSULE: 300 | 30 days supply | Qty: 60 | Fill #0

## 2020-02-24 MED FILL — ABIRATERONE ACETATE 250 MG: 250 | 30 days supply | Qty: 120 | Fill #0

## 2020-02-24 NOTE — Progress Notes (Signed)
[facility] [phone]   [phone]  PROGRESS NOTE  Patient Care Team: [name], MD as PCP - General (Internal Medicine) Cira Rue, RN Nurse Navigator as Registered Nurse (Medical Oncology)  Hematological/Oncological History # Metastatic Castrate Sensitive Prostate Cancer, Metastatic to Bone 1) 07/13/2019: Abdomen/Pelvis CT extensive lytic changes with superimposed pathological fractures. Lytic change noted in the right iliac bone and new lucencies in the right T10 vertebrae.  2) 07/19/2019: biopsy of sacral mass shows metastatic prostatic adenocarcinoma, Gleason 4+4.  3) 07/2019: reportedly received Zometa 4g IV and eligard 22.5. Started on Casodex.  4) 6/10-6/16/2021: received palliative radiation to the sacrum at [facility]. Received 2000cGy over 6 days 5) Moved to [place]. Lost to follow up from [facility] in [place], [place]. 6) 11/05/2019: establish care with Dr. Lorenso Courier  7) 11/16/2019: Administered Zometa 4mg  IV and Lupron 22.5 mg  Interval History:  Dustin Alvarez 65 y.o. male with medical history significant for metastatic prostate cancer who presents for a follow up visit. The patient's last visit was on 01/13/2020. In the interim since the last visit he has had no major changes in his health.   On exam today Mr. Tailor reports he has continued to struggle with pain in his left hip.  He notes it is 8 out of 10 in severity and has not been improved by radiation therapy.  He also reports taking his OxyContin as prescribed however he is taking two oxycodone 5 mg 4-5 times per day in order to try to help with the pain.  He reports that he has taken gabapentin at night but has not seen much relief from this either.  He notes that the pain shoots down his leg and occasionally his "toes freeze up".  On further discussion he also says he needs something for his "sex drive".  He is very discouraged by the fact that he is having difficulty  getting erections.  He tried the Viagra initially, and then Cialis, but now wants to try the Viagra again as he says the Cialis "did nothing".  He otherwise denies any fevers, chills, sweats, nausea, vomiting or diarrhea.  He notes he is tolerating abiraterone therapy well and has no questions or concerns regarding his care.  A full 10 point ROS is listed below.  MEDICAL HISTORY:  Past Medical History:  Diagnosis Date  . Asthma   . COPD (chronic obstructive pulmonary disease) ([place])   . Hypertension   . Prostate cancer [facility])     SURGICAL HISTORY: Past Surgical History:  Procedure Laterality Date  . WRIST SURGERY Left 2017    SOCIAL HISTORY: Social History   Socioeconomic History  . Marital status: Legally Separated    Spouse name: Not on file  . Number of children: Not on file  . Years of education: Not on file  . Highest education level: Not on file  Occupational History  . Not on file  Tobacco Use  . Smoking status: Current Every Day Smoker    Packs/day: 1.00  . Smokeless tobacco: Never Used  . Tobacco comment: almost 1 pkd  Vaping Use  . Vaping Use: Never used  Substance and Sexual Activity  . Alcohol use: Yes    Comment: 1-2 drinks per week.  . Drug use: Never  . Sexual activity: Not Currently  Other Topics Concern  . Not on file  Social History Narrative  . Not on file   Social Determinants of Health   Financial Resource Strain: Not  on file  Food Insecurity: Not on file  Transportation Needs: Not on file  Physical Activity: Not on file  Stress: Not on file  Social Connections: Not on file  Intimate Partner Violence: Not on file    FAMILY HISTORY: Family History  Problem Relation Age of Onset  . COPD Father   . Diabetes Sister   . Diabetes Brother   . Breast cancer Neg Hx   . Prostate cancer Neg Hx   . Colon cancer Neg Hx   . Pancreatic cancer Neg Hx     ALLERGIES:  is allergic to ace inhibitors.  MEDICATIONS:  Current Outpatient Medications   Medication Sig Dispense Refill  . abiraterone acetate (ZYTIGA) 250 MG tablet TAKE 4 TABLETS (1,000 MG TOTAL) BY MOUTH DAILY. TAKE ON AN EMPTY STOMACH 1 HOUR BEFORE OR 2 HOURS AFTER A MEAL 120 tablet 0  . albuterol (VENTOLIN HFA) 108 (90 Base) MCG/ACT inhaler Inhale 2 puffs into the lungs every 6 (six) hours as needed for wheezing or shortness of breath. 8 g 3  . amLODipine (NORVASC) 5 MG tablet Take 1 tablet (5 mg total) by mouth daily. 90 tablet 3  . bicalutamide (CASODEX) 50 MG tablet Take 1 tablet (50 mg total) by mouth daily. 30 tablet 0  . budesonide-formoterol (SYMBICORT) 160-4.5 MCG/ACT inhaler Inhale 2 puffs into the lungs 2 (two) times daily. 1 each 2  . calcium-vitamin D (OSCAL WITH D) 500-200 MG-UNIT tablet Take 1 tablet by mouth daily with breakfast. 90 tablet 3  . finasteride (PROSCAR) 5 MG tablet Take 1 tablet (5 mg total) by mouth daily. 90 tablet 1  . Fluticasone-Umeclidin-Vilant (TRELEGY ELLIPTA) 100-62.5-25 MCG/INH AEPB Inhale 2 puffs into the lungs daily. 180 each 0  . gabapentin (NEURONTIN) 300 MG capsule Take 1 capsule (300 mg total) by mouth at bedtime. 30 capsule 2  . hydrochlorothiazide (MICROZIDE) 12.5 MG capsule Take 1 capsule (12.5 mg total) by mouth daily. 90 capsule 1  . lisinopril (ZESTRIL) 20 MG tablet Take 20 mg by mouth daily.    Marland Kitchen oxyCODONE (OXY IR/ROXICODONE) 5 MG immediate release tablet Take 5 mg by mouth every 4 (four) hours as needed.    Marland Kitchen oxyCODONE 30 MG 12 hr tablet Take 1 tablet (30 mg total) by mouth every 12 (twelve) hours. 60 tablet 0  . pantoprazole (PROTONIX) 40 MG tablet Take 1 tablet (40 mg total) by mouth daily. 90 tablet 3  . predniSONE (DELTASONE) 5 MG tablet Take 1 tablet (5 mg total) by mouth daily with breakfast. (do not take prior to starting Abiraterone) 90 tablet 1  . senna-docusate (SENOKOT-S) 8.6-50 MG tablet Take 1 tablet by mouth 2 (two) times daily. 60 tablet 1  . sildenafil (VIAGRA) 50 MG tablet Take 1 tablet (50 mg total) by mouth  daily as needed for erectile dysfunction. 10 tablet 0  . tamsulosin (FLOMAX) 0.4 MG CAPS capsule Take 1 capsule (0.4 mg total) by mouth in the morning and at bedtime. 60 capsule 0  . traZODone (DESYREL) 50 MG tablet Take 1 tablet (50 mg total) by mouth at bedtime. 30 tablet 0   No current facility-administered medications for this visit.    REVIEW OF SYSTEMS:   Constitutional: ( - ) fevers, ( - )  chills , ( - ) night sweats Eyes: ( - ) blurriness of vision, ( - ) double vision, ( - ) watery eyes Ears, nose, mouth, throat, and face: ( - ) mucositis, ( - ) sore throat Respiratory: ( - )  cough, ( - ) dyspnea, ( - ) wheezes Cardiovascular: ( - ) palpitation, ( - ) chest discomfort, ( - ) lower extremity swelling Gastrointestinal:  ( - ) nausea, ( - ) heartburn, ( - ) change in bowel habits Skin: ( - ) abnormal skin rashes Lymphatics: ( - ) new lymphadenopathy, ( - ) easy bruising Neurological: ( - ) numbness, ( - ) tingling, ( - ) new weaknesses Behavioral/Psych: ( - ) mood change, ( - ) new changes  All other systems were reviewed with the patient and are negative.  PHYSICAL EXAMINATION:  Vitals:   02/24/20 1503  BP: 122/78  Pulse: (!) 103  Resp: 15  Temp: 98.1 F (36.7 C)  SpO2: 98%   Filed Weights   02/24/20 1503  Weight: 139 lb 12.8 oz (63.4 kg)    GENERAL: well appearing elderly African American male in NAD. SKIN: skin color, texture, turgor are normal, no rashes or significant lesions EYES: conjunctiva are pink and non-injected, sclera clear LUNGS: clear to auscultation and percussion with normal breathing effort HEART: regular rate & rhythm and no murmurs and no lower extremity edema Musculoskeletal: no cyanosis of digits and no clubbing  PSYCH: alert & oriented x 3, fluent speech NEURO: no focal motor/sensory deficits  LABORATORY DATA:  I have reviewed the data as listed CBC Latest Ref Rng & Units 02/24/2020 02/10/2020 01/27/2020  WBC 4.0 - 10.5 K/uL 3.8(L) 3.3(L)  3.3(L)  Hemoglobin 13.0 - 17.0 g/dL 11.5(L) 11.9(L) 11.2(L)  Hematocrit 39.0 - 52.0 % 34.9(L) 34.9(L) 34.0(L)  Platelets 150 - 400 K/uL 269 282 293    CMP Latest Ref Rng & Units 02/24/2020 02/10/2020 01/27/2020  Glucose 70 - 99 mg/dL 111(H) 141(H) 109(H)  BUN 8 - 23 mg/dL 9 9 10   Creatinine 0.61 - 1.24 mg/dL 0.81 0.81 0.88  Sodium 135 - 145 mmol/L 140 136 138  Potassium 3.5 - 5.1 mmol/L 3.6 3.0(L) 4.3  Chloride 98 - 111 mmol/L 101 99 101  CO2 22 - 32 mmol/L 29 31 30   Calcium 8.9 - 10.3 mg/dL 9.7 9.3 9.8  Total Protein 6.5 - 8.1 g/dL 7.7 7.3 7.5  Total Bilirubin 0.3 - 1.2 mg/dL 0.4 0.4 0.4  Alkaline Phos 38 - 126 U/L 80 68 77  AST 15 - 41 U/L 11(L) 12(L) 12(L)  ALT 0 - 44 U/L 7 10 9     RADIOGRAPHIC STUDIES: No results found.  ASSESSMENT & PLAN Dustin Alvarez Dustin Alvarez 65 y.o. male with medical history significant for metastatic prostate cancer who presents for a follow up visit.  Mr. Chiaramonte has excessively transitioned off the Casodex and has received his first dosages of Zometa and Lupron.  His PSA continues to trend downward and is testosterone is at castrate level.  Given this we started abiraterone therapy 1000 mg p.o. daily with 5 mg of prednisone.  In the interim he has completed palliative radiation therapy.   The major symptom the patient has been experiencing has been pain.  His spinal sacral lesion has been poorly controlled on oxycodone therapy.  He did previously receive radiation therapy in June 2021, but due to continued excruciating pain we asked for him to be reexamined by radiation oncology to see if there is any further intervention they can offer at this time. He has had further palliative radiation, though this has not relieved his pain. Recommend he increase OxyContin to 40 mg twice daily and additionally continue his as needed fast acting oxycodone. We will increase gabapentin to  300mg  BID.  Due to the need for monitoring of abiraterone we will plan to have him back in 2  weeks for lab visit and in 4 weeks for clinic visit.  PSA 07/13/2019: PSA 819 08/27/2019: 226 11/05/2019: 44.8 12/08/2019: 27.3 12/27/2019: 21.2   # Metastatic Castrate Sensitive Prostate Cancer, Metastatic to Bone --findings are most consistent with metastatic adenocarcinoma of the prostate.  --testosterone at last visit was <3, PSA down to 21.2 ( from 819 at diagnosis)  --Administered Zometa 4mg  IV and Lupron 22.5 mg on 11/16/2019. Next due Jan 2022 (in about 2 weeks).  --continue abiraterone 1000mg  with prednisone 5mg  PO daily. Assure he is compliant with the full dose and steroids.  --RTC in 4 weeks with interval 2 week lab check on Abiraterone therapy   #Symptom Management --patient requested a refill of viagra due to issues with ED (Viagra was ineffective). Unfortunately this was similiarly disappointing.  --gabapentin 300 mg BID to help with pain.  --continue oxycodone 5-10mg  q4H PRN for pain control. --increase to oxycontin 40mg  q12H for basal pain control --established care with Radiation Oncology to help with painful bone lesions. Radiation therapy underway.   No orders of the defined types were placed in this encounter.   All questions were answered. The patient knows to call the clinic with any problems, questions or concerns.  A total of more than 30 minutes were spent on this encounter and over half of that time was spent on counseling and coordination of care as outlined above.   Ledell Peoples, MD Department of Hematology/Oncology [facility] Phone: [phone] Pager: [phone] Email: [email or web address]  02/24/2020 3:59 PM

## 2020-02-25 ENCOUNTER — Other Ambulatory Visit: Payer: Self-pay | Admitting: Hematology and Oncology

## 2020-02-25 ENCOUNTER — Telehealth: Payer: Self-pay | Admitting: *Deleted

## 2020-02-25 LAB — TESTOSTERONE: Testosterone: <3 ng/dL — ABNORMAL LOW (ref 264–916)

## 2020-02-25 LAB — PSA, TOTAL AND FREE
PSA, Free Pct: 19.2 %
PSA, Free: 2.32 ng/mL
Prostate Specific Ag, Serum: 12.1 ng/mL — ABNORMAL HIGH (ref 0.0–4.0)

## 2020-02-25 MED ORDER — OXYCODONE HCL 5 MG PO TABS: 5.0000 mg | ORAL_TABLET | ORAL | 0 refills | Status: DC | PRN

## 2020-02-25 MED FILL — oxyCODONE HCL 5 MG TABS: 5 | 30 days supply | Qty: 180 | Fill #0

## 2020-02-25 NOTE — Telephone Encounter (Signed)
TCT patient regarding his lab results from yesterday. He was not home but his daughter was there. Reviewed lab results with her and advised that his PSA is coming down and his Testosterone is at  Target level.  Alos advised that his oxycodone 40 mg tabs and his viagra have been sent to [facility].  His daughter states he needs a refill of his oxycodone 5 mg tabs as well.  Dr. Lorenso Courier made aware of this.

## 2020-02-25 NOTE — Telephone Encounter (Signed)
-----   Message from [name], MD sent at 02/25/2020 11:28 AM EST ----- Please let Dustin Alvarez know that his PSA is declining and his testosterone is at target. His pain medications and viagra were called into [facility] pharmacy (where his oxycontin is typically called in)  ----- Message ----- From: Interface, Lab In [place] Sent: 02/24/2020   2:57 PM EST To: [name], MD

## 2020-03-02 ENCOUNTER — Telehealth: Payer: Self-pay | Admitting: Hematology and Oncology

## 2020-03-02 NOTE — Telephone Encounter (Signed)
Scheduled per 1/13 los. Called and spoke with [name] and confirmed 1/27 and 2/9 appts

## 2020-03-03 ENCOUNTER — Other Ambulatory Visit: Payer: Self-pay | Admitting: Hematology and Oncology

## 2020-03-03 MED FILL — OxyCONTIN 40 MG T12A: 40 | 23 days supply | Qty: 46 | Fill #0

## 2020-03-03 NOTE — Telephone Encounter (Signed)
Refill request. Previous script was for 14 tabs-1 week's worth. Pt called as well

## 2020-03-06 ENCOUNTER — Telehealth: Payer: Self-pay

## 2020-03-06 NOTE — Progress Notes (Signed)
  Radiation Oncology         [phone]) [phone] ________________________________  Name: Dustin Alvarez MRN: [id number]  Date: 02/02/2020  DOB: 17-Jul-1955  End of Treatment Note  Diagnosis:   65 y.o. gentleman with oligometastatic castrate-sensitive prostate cancer, Gleason 4+4 with current PSA 27.3 on ADT.     Indication for treatment:  Curative, Definitive Radiotherapy       Radiation treatment dates:   11/22-12/22/21  Site/dose:   The prostate was treated to 60 Gy in 20 fractions of 3 Gy to the prostate and lumbosacral metastases  Beams/energy:   The patient was treated with IMRT using three volumetric arc therapy delivering 6 MV X-rays to clockwise and counterclockwise circumferential arcs with a three distinct offset collimator offset to avoid dose scalloping.  Image guidance was performed with daily cone beam CT prior to each fraction to align to  prostate and assure proper bladder and rectal fill volumes.  Immobilization was achieved with BodyFix custom mold.  Narrative: The patient tolerated radiation treatment relatively well.   The patient experienced some minor urinary irritation and modest fatigue.    Plan: The patient has completed radiation treatment. He will return to radiation oncology clinic for routine followup in one month. I advised him to call or return sooner if he has any questions or concerns related to his recovery or treatment. ________________________________  Sheral Apley. Tammi Klippel, M.D.

## 2020-03-06 NOTE — Telephone Encounter (Signed)
Left message for patient to call back in regards to telephone follow-up appointment on 03/08/20 @ 11:00am. Called patient to review meaningful use, AUA and prostate questions. TM

## 2020-03-07 ENCOUNTER — Other Ambulatory Visit: Payer: Self-pay | Admitting: *Deleted

## 2020-03-07 DIAGNOSIS — C61 Malignant neoplasm of prostate: Secondary | ICD-10-CM

## 2020-03-08 ENCOUNTER — Ambulatory Visit
Admission: RE | Admit: 2020-03-08 | Discharge: 2020-03-08 | Disposition: A | Discharge status: Home / Self Care | Payer: Medicare Other | Source: Ambulatory Visit | Attending: Urology | Admitting: Urology

## 2020-03-08 ENCOUNTER — Other Ambulatory Visit: Payer: Self-pay

## 2020-03-08 ENCOUNTER — Other Ambulatory Visit: Payer: Self-pay | Admitting: Internal Medicine

## 2020-03-08 ENCOUNTER — Other Ambulatory Visit: Payer: Medicare Other | Admitting: Hospice

## 2020-03-08 DIAGNOSIS — C61 Malignant neoplasm of prostate: Secondary | ICD-10-CM

## 2020-03-08 DIAGNOSIS — Z515 Encounter for palliative care: Secondary | ICD-10-CM

## 2020-03-08 MED ORDER — TRAZODONE HCL 50 MG PO TABS: 50.0000 mg | ORAL_TABLET | Freq: Every day | ORAL | 0 refills | Status: DC

## 2020-03-08 NOTE — Progress Notes (Signed)
[facility] Consult Note Telephone: [phone]  Fax: [phone]  PATIENT NAME: Dustin Alvarez DOB: 02-May-1955 MRN: [id number]  PRIMARY CARE PROVIDER:   [name], MD [name], MD [address] [place],  [place] [address]  [place]: [name], MD [name], MD [address] [place],  [place] [address]  [place] Emergency contact: Dustin Alvarez - [phone] phone Sister - Dustin Alvarez [phone]  Visit to build trust and follow up on palliative care and goals of care clarification        RECOMMENDATIONS/PLAN:  ADVANCE CARE PLANNING/CODE STATUS: CODE STATUS reviewed.  Patient remains a DO NOT RESUSCITATE.  GOALS OF CARE: Goals of care include to maximize quality of life and symptom management.  Patient wishes to get stronger and become more independent.  Extensive discussion on medical orders for scope of treatment (MOST). Selections include feeding tube for defined trial period, limited additional intervention, antibiotics as indicated, IV fluids as indicated. Signed MOST form left for patient at home; same document uploaded to Epic today.  Follow up Palliative Care Visit: Palliative care will continue to follow for goals of care clarification and symptom management.Follow-up in 2 months/as needed.  Symptom Management: Radiation treatment for prostrate cancer with metastases to the bone was completed; well tolerated. Patient with no complaint today; denies pain/discomfort. Encouraged ongoing care. Palliative will continue to monitor for symptom management/decline and make recommendations as needed.  Family /Caregiver/Community Supports: Patient lives independently at home.Spirituality and faith in God keep him going and hopeful. His children visit often to help him with errands.   I spent one hour and 16 minutes providing  this consultation; time includes time spent with patient, chart review, provider coordination, and documentation. More than 50% of the time in this consultation was spent on counseling coordinating communication  CHIEF COMPLAIN/HISTORY OF PRESENT ILLNESS:Dustin Alvarez a 65 y.o.malewith multiple medical problems including prostate cancer with metastases to bone, hypertension, COPD. Palliative Care was asked to follow this patient by consultation request ofChristian, [name], MDto help address advance care planning and goals of care.  CODE STATUS:DNR  PPS: 50%  HOSPICE ELIGIBILITY/DIAGNOSIS: TBD  PAST MEDICAL HISTORY:  Past Medical History:  Diagnosis Date  . Asthma   . COPD (chronic obstructive pulmonary disease) ([place])   . Hypertension   . Prostate cancer [facility])     SOCIAL HX: Patient lives at home alone, of [place].  ALLERGIES:  Allergies  Allergen Reactions  . Ace Inhibitors Swelling    lisinopril     PERTINENT MEDICATIONS:  Outpatient Encounter Medications as of 03/08/2020  Medication Sig  . abiraterone acetate (ZYTIGA) 250 MG tablet TAKE 4 TABLETS (1,000 MG TOTAL) BY MOUTH DAILY. TAKE ON AN EMPTY STOMACH 1 HOUR BEFORE OR 2 HOURS AFTER A MEAL  . albuterol (VENTOLIN HFA) 108 (90 Base) MCG/ACT inhaler Inhale 2 puffs into the lungs every 6 (six) hours as needed for wheezing or shortness of breath.  Marland Kitchen amLODipine (NORVASC) 5 MG tablet Take 1 tablet (5 mg total) by mouth daily.  . budesonide-formoterol (SYMBICORT) 160-4.5 MCG/ACT inhaler Inhale 2 puffs into the lungs 2 (two) times daily.  . calcium-vitamin D (OSCAL WITH D) 500-200 MG-UNIT tablet Take 1 tablet by mouth daily with breakfast.  . finasteride (PROSCAR) 5 MG tablet Take 1 tablet (5 mg total) by mouth daily.  . Fluticasone-Umeclidin-Vilant (TRELEGY ELLIPTA) 100-62.5-25 MCG/INH AEPB Inhale 2 puffs into the lungs daily.  Marland Kitchen  gabapentin (NEURONTIN) 300 MG capsule Take 1 capsule (300 mg total) by mouth 2 (two)  times daily.  . hydrochlorothiazide (MICROZIDE) 12.5 MG capsule Take 1 capsule (12.5 mg total) by mouth daily.  Marland Kitchen lisinopril (ZESTRIL) 20 MG tablet Take 20 mg by mouth daily.  Marland Kitchen oxyCODONE (OXY IR/ROXICODONE) 5 MG immediate release tablet Take 1 tablet (5 mg total) by mouth every 4 (four) hours as needed.  Marland Kitchen oxyCODONE (OXYCONTIN) 40 mg 12 hr tablet Take 1 tablet (40 mg total) by mouth every 12 (twelve) hours.  . pantoprazole (PROTONIX) 40 MG tablet Take 1 tablet (40 mg total) by mouth daily.  . predniSONE (DELTASONE) 5 MG tablet Take 1 tablet (5 mg total) by mouth daily with breakfast. (do not take prior to starting Abiraterone)  . senna-docusate (SENOKOT-S) 8.6-50 MG tablet Take 1 tablet by mouth 2 (two) times daily.  . sildenafil (VIAGRA) 50 MG tablet Take 1 tablet (50 mg total) by mouth daily as needed for erectile dysfunction.  . tamsulosin (FLOMAX) 0.4 MG CAPS capsule Take 1 capsule (0.4 mg total) by mouth in the morning and at bedtime.  . traZODone (DESYREL) 50 MG tablet Take 1 tablet (50 mg total) by mouth at bedtime.   No facility-administered encounter medications on file as of 03/08/2020.     PHYSICAL EXAM  Constitutional: In no acute distress Cardiovascular: regular rate and rhythm Pulmonary: no cough, no increased work of breathing, normal respiratory effort Eyes: Normal lids, no discharge, sclera anicteric ENMT: Moist mucous membranes Musculoskeletal:  no edema in BLE Skin: no rash to visible skin, warm without cyanosis Psych: non-anxious affect Neurological: Weakness but otherwise non focal  Palliative Care was asked to follow this patient by consultation request of Christian, Lucinda Dell, MD to help address advance care planning and complex decision making. Thank you for the opportunity to participate in the care of Dustin Alvarez Please call our office at [phone] if we can be of additional assistance.  Note: Portions of this note were generated with Theatre manager. Dictation errors may occur despite best attempts at proofreading.  Teodoro Spray, NP

## 2020-03-08 NOTE — Telephone Encounter (Signed)
  traZODone (DESYREL) 50 MG tablet, REFILL REQUEST @  [place], [place] Phone:  [phone]  Fax:  [phone]

## 2020-03-09 ENCOUNTER — Other Ambulatory Visit: Payer: Self-pay

## 2020-03-09 ENCOUNTER — Inpatient Hospital Stay: Payer: Medicare Other

## 2020-03-09 ENCOUNTER — Telehealth: Payer: Self-pay

## 2020-03-09 VITALS — BP 134/87 | HR 88 | Temp 98.2°F | Resp 17

## 2020-03-09 DIAGNOSIS — C61 Malignant neoplasm of prostate: Secondary | ICD-10-CM

## 2020-03-09 DIAGNOSIS — C7951 Secondary malignant neoplasm of bone: Secondary | ICD-10-CM

## 2020-03-09 LAB — CMP (CANCER CENTER ONLY)
ALT: 10 U/L (ref 0–44)
AST: 13 U/L — ABNORMAL LOW (ref 15–41)
Albumin: 3.4 g/dL — ABNORMAL LOW (ref 3.5–5.0)
Alkaline Phosphatase: 78 U/L (ref 38–126)
Anion gap: 7 (ref 5–15)
BUN: 9 mg/dL (ref 8–23)
CO2: 28 mmol/L (ref 22–32)
Calcium: 9.2 mg/dL (ref 8.9–10.3)
Chloride: 103 mmol/L (ref 98–111)
Creatinine: 0.84 mg/dL (ref 0.61–1.24)
GFR, Estimated: >60 mL/min (ref >60)
Glucose, Bld: 128 mg/dL — ABNORMAL HIGH (ref 70–99)
Potassium: 3.1 mmol/L — ABNORMAL LOW (ref 3.5–5.1)
Sodium: 138 mmol/L (ref 135–145)
Total Bilirubin: 0.4 mg/dL (ref 0.3–1.2)
Total Protein: 7.4 g/dL (ref 6.5–8.1)

## 2020-03-09 LAB — CBC WITH DIFFERENTIAL (CANCER CENTER ONLY)
Abs Immature Granulocytes: 0 10*3/uL (ref 0.00–0.07)
Basophils Absolute: 0 10*3/uL (ref 0.0–0.1)
Basophils Relative: 0 %
Eosinophils Absolute: 0 10*3/uL (ref 0.0–0.5)
Eosinophils Relative: 1 %
HCT: 34.5 % — ABNORMAL LOW (ref 39.0–52.0)
Hemoglobin: 11.6 g/dL — ABNORMAL LOW (ref 13.0–17.0)
Immature Granulocytes: 0 %
Lymphocytes Relative: 10 %
Lymphs Abs: 0.3 10*3/uL — ABNORMAL LOW (ref 0.7–4.0)
MCH: 27.2 pg (ref 26.0–34.0)
MCHC: 33.6 g/dL (ref 30.0–36.0)
MCV: 80.8 fL (ref 80.0–100.0)
Monocytes Absolute: 0.3 10*3/uL (ref 0.1–1.0)
Monocytes Relative: 11 %
Neutro Abs: 2.2 10*3/uL (ref 1.7–7.7)
Neutrophils Relative %: 78 %
Platelet Count: 281 10*3/uL (ref 150–400)
RBC: 4.27 MIL/uL (ref 4.22–5.81)
RDW: 14.6 % (ref 11.5–15.5)
WBC Count: 2.8 10*3/uL — ABNORMAL LOW (ref 4.0–10.5)
nRBC: 0 % (ref 0.0–0.2)

## 2020-03-09 MED ORDER — LEUPROLIDE ACETATE (3 MONTH) 22.5 MG ~~LOC~~ KIT
PACK | SUBCUTANEOUS | Status: AC
  Filled 2020-03-09: qty 22.5

## 2020-03-09 MED ORDER — LEUPROLIDE ACETATE (3 MONTH) 22.5 MG ~~LOC~~ KIT
22.5000 mg | PACK | Freq: Once | SUBCUTANEOUS | Status: AC
  Administered 2020-03-09: 22.5 mg via SUBCUTANEOUS

## 2020-03-09 MED ORDER — ZOLEDRONIC ACID 4 MG/100ML IV SOLN
4.0000 mg | Freq: Once | INTRAVENOUS | Status: AC
  Administered 2020-03-09: 4 mg via INTRAVENOUS

## 2020-03-09 MED ORDER — SODIUM CHLORIDE 0.9 % IV SOLN
INTRAVENOUS | Status: DC
  Filled 2020-03-09: qty 250

## 2020-03-09 MED ORDER — ZOLEDRONIC ACID 4 MG/100ML IV SOLN
INTRAVENOUS | Status: AC
  Filled 2020-03-09: qty 100

## 2020-03-09 NOTE — Telephone Encounter (Signed)
ERROR

## 2020-03-09 NOTE — Progress Notes (Signed)
Radiation Oncology         [phone]) [phone] ________________________________  Name: Dustin Alvarez MRN: [id number]  Date: 03/08/2020  DOB: June 02, 1955  Post Treatment Note  CC: Dustin Hansen, MD  Dustin Slick, MD  Diagnosis:   65 y.o. gentleman with oligometastatic castrate-sensitive prostate cancer, Gleason 4+4 with current PSA 27.3 on ADT.     Interval Since Last Radiation:  4 weeks  01/02/21-02/02/20:  The prostate was treated to 60 Gy in 20 fractions of 3 Gy to the prostate and lumbosacral metastases (L3- S2).  Narrative: The patient was not available to come to the phone so I spoke with his friend to complete his schedule I month follow-up visit via telephone.  She reports that the patient is doing quite well and has been able to ambulate more pain-free.  He has continued with pain in the left hip despite the additional radiation but seems improved.  He had a follow-up visit recently with Dr. Lorenso Courier on 03/02/2020 with plans for repeat PSA in 2 weeks and his next follow-up visit in 4 weeks with plans to continue Zytiga and Lupron..                          ALLERGIES:  is allergic to ace inhibitors.  Meds: Current Outpatient Medications  Medication Sig Dispense Refill  . abiraterone acetate (ZYTIGA) 250 MG tablet TAKE 4 TABLETS (1,000 MG TOTAL) BY MOUTH DAILY. TAKE ON AN EMPTY STOMACH 1 HOUR BEFORE OR 2 HOURS AFTER A MEAL 120 tablet 0  . albuterol (VENTOLIN HFA) 108 (90 Base) MCG/ACT inhaler Inhale 2 puffs into the lungs every 6 (six) hours as needed for wheezing or shortness of breath. 8 g 3  . amLODipine (NORVASC) 5 MG tablet Take 1 tablet (5 mg total) by mouth daily. 90 tablet 3  . calcium-vitamin D (OSCAL WITH D) 500-200 MG-UNIT tablet Take 1 tablet by mouth daily with breakfast. 90 tablet 3  . finasteride (PROSCAR) 5 MG tablet Take 1 tablet (5 mg total) by mouth daily. 90 tablet 1  . Fluticasone-Umeclidin-Vilant (TRELEGY ELLIPTA) 100-62.5-25 MCG/INH AEPB Inhale 2 puffs into  the lungs daily. 180 each 0  . gabapentin (NEURONTIN) 300 MG capsule Take 1 capsule (300 mg total) by mouth 2 (two) times daily. 60 capsule 2  . hydrochlorothiazide (MICROZIDE) 12.5 MG capsule Take 1 capsule (12.5 mg total) by mouth daily. 90 capsule 1  . lisinopril (ZESTRIL) 20 MG tablet Take 20 mg by mouth daily.    Marland Kitchen oxyCODONE (OXY IR/ROXICODONE) 5 MG immediate release tablet Take 1 tablet (5 mg total) by mouth every 4 (four) hours as needed. 180 tablet 0  . oxyCODONE (OXYCONTIN) 40 mg 12 hr tablet Take 1 tablet (40 mg total) by mouth every 12 (twelve) hours. 60 tablet 0  . pantoprazole (PROTONIX) 40 MG tablet Take 1 tablet (40 mg total) by mouth daily. 90 tablet 3  . predniSONE (DELTASONE) 5 MG tablet Take 1 tablet (5 mg total) by mouth daily with breakfast. (do not take prior to starting Abiraterone) 90 tablet 1  . senna-docusate (SENOKOT-S) 8.6-50 MG tablet Take 1 tablet by mouth 2 (two) times daily. 60 tablet 1  . sildenafil (VIAGRA) 50 MG tablet Take 1 tablet (50 mg total) by mouth daily as needed for erectile dysfunction. 10 tablet 0  . SYMBICORT 160-4.5 MCG/ACT inhaler Inhale 2 puffs into the lungs 2 (two) times daily. 10.2 g 2  . tamsulosin (FLOMAX) 0.4  MG CAPS capsule Take 1 capsule (0.4 mg total) by mouth in the morning and at bedtime. 60 capsule 0  . traZODone (DESYREL) 50 MG tablet Take 1 tablet (50 mg total) by mouth at bedtime. 30 tablet 0   No current facility-administered medications for this encounter.    Physical Findings:  vitals were not taken for this visit.   /Unable to assess due to telephone follow-up visit format. Lab Findings: Lab Results  Component Value Date   WBC 2.8 (L) 03/09/2020   HGB 11.6 (L) 03/09/2020   HCT 34.5 (L) 03/09/2020   MCV 80.8 03/09/2020   PLT 281 03/09/2020     Radiographic Findings: No results found.  Impression/Plan: 1. 65 y.o. gentleman with oligometastatic castrate-sensitive prostate cancer, Gleason 4+4 with current PSA 27.3 on  ADT.    He appears to have recovered well from the effects of his recent radiotherapy.  We discussed that while we are happy to continue to participate in his care if clinically indicated, at this point, we will plan to see him back on an as-needed basis.  He will continue in routine follow-up under the care and direction of Dr. Lorenso Courier for continued management of his disease.  We enjoyed taking care of him and look forward to continuing to follow his progress via correspondence.    Dustin Johns, PA-C

## 2020-03-09 NOTE — Telephone Encounter (Signed)
Call from pt's daughter requesting a refill on Bicalutamide (Casodedx) which is not on current med list. According to chart, it was discontinued on 02/24/20 by nurse at the cancer ctr. Instructed pt's daughter to call the cancer ctr.

## 2020-03-09 NOTE — Patient Instructions (Signed)
Zoledronic Acid Injection (Hypercalcemia, Oncology) What is this medicine? ZOLEDRONIC ACID (ZOE le dron ik AS id) slows calcium loss from bones. It high calcium levels in the blood from some kinds of cancer. It may be used in other people at risk for bone loss. This medicine may be used for other purposes; ask your health care provider or pharmacist if you have questions. COMMON BRAND NAME(S): Zometa What should I tell my health care provider before I take this medicine? They need to know if you have any of these conditions:  cancer  dehydration  dental disease  kidney disease  liver disease  low levels of calcium in the blood  lung or breathing disease (asthma)  receiving steroids like dexamethasone or prednisone  an unusual or allergic reaction to zoledronic acid, other medicines, foods, dyes, or preservatives  pregnant or trying to get pregnant  breast-feeding How should I use this medicine? This drug is injected into a vein. It is given by a health care provider in a hospital or clinic setting. Talk to your health care provider about the use of this drug in children. Special care may be needed. Overdosage: If you think you have taken too much of this medicine contact a poison control center or emergency room at once. NOTE: This medicine is only for you. Do not share this medicine with others. What if I miss a dose? Keep appointments for follow-up doses. It is important not to miss your dose. Call your health care provider if you are unable to keep an appointment. What may interact with this medicine?  certain antibiotics given by injection  NSAIDs, medicines for pain and inflammation, like ibuprofen or naproxen  some diuretics like bumetanide, furosemide  teriparatide  thalidomide This list may not describe all possible interactions. Give your health care provider a list of all the medicines, herbs, non-prescription drugs, or dietary supplements you use. Also tell  them if you smoke, drink alcohol, or use illegal drugs. Some items may interact with your medicine. What should I watch for while using this medicine? Visit your health care provider for regular checks on your progress. It may be some time before you see the benefit from this drug. Some people who take this drug have severe bone, joint, or muscle pain. This drug may also increase your risk for jaw problems or a broken thigh bone. Tell your health care provider right away if you have severe pain in your jaw, bones, joints, or muscles. Tell you health care provider if you have any pain that does not go away or that gets worse. Tell your dentist and dental surgeon that you are taking this drug. You should not have major dental surgery while on this drug. See your dentist to have a dental exam and fix any dental problems before starting this drug. Take good care of your teeth while on this drug. Make sure you see your dentist for regular follow-up appointments. You should make sure you get enough calcium and vitamin D while you are taking this drug. Discuss the foods you eat and the vitamins you take with your health care provider. Check with your health care provider if you have severe diarrhea, nausea, and vomiting, or if you sweat a lot. The loss of too much body fluid may make it dangerous for you to take this drug. You may need blood work done while you are taking this drug. Do not become pregnant while taking this drug. Women should inform their health care provider   if they wish to become pregnant or think they might be pregnant. There is potential for serious harm to an unborn child. Talk to your health care provider for more information. What side effects may I notice from receiving this medicine? Side effects that you should report to your doctor or health care provider as soon as possible:  allergic reactions (skin rash, itching or hives; swelling of the face, lips, or tongue)  bone  pain  infection (fever, chills, cough, sore throat, pain or trouble passing urine)  jaw pain, especially after dental work  joint pain  kidney injury (trouble passing urine or change in the amount of urine)  low blood pressure (dizziness; feeling faint or lightheaded, falls; unusually weak or tired)  low calcium levels (fast heartbeat; muscle cramps or pain; pain, tingling, or numbness in the hands or feet; seizures)  low magnesium levels (fast, irregular heartbeat; muscle cramp or pain; muscle weakness; tremors; seizures)  low red blood cell counts (trouble breathing; feeling faint; lightheaded, falls; unusually weak or tired)  muscle pain  redness, blistering, peeling, or loosening of the skin, including inside the mouth  severe diarrhea  swelling of the ankles, feet, hands  trouble breathing Side effects that usually do not require medical attention (report to your doctor or health care provider if they continue or are bothersome):  anxious  constipation  coughing  depressed mood  eye irritation, itching, or pain  fever  general ill feeling or flu-like symptoms  nausea  pain, redness, or irritation at site where injected  trouble sleeping This list may not describe all possible side effects. Call your doctor for medical advice about side effects. You may report side effects to FDA at 1-800-FDA-1088. Where should I keep my medicine? This drug is given in a hospital or clinic. It will not be stored at home. NOTE: This sheet is a summary. It may not cover all possible information. If you have questions about this medicine, talk to your doctor, pharmacist, or health care provider.  2021 [employer]/Gold Standard (2018-11-12 09:13:00) Leuprolide depot injection What is this medicine? LEUPROLIDE (loo PROE lide) is a man-made protein that acts like a natural hormone in the body. It decreases testosterone in men and decreases estrogen in women. In men, this medicine is  used to treat advanced prostate cancer. In women, some forms of this medicine may be used to treat endometriosis, uterine fibroids, or other male hormone-related problems. This medicine may be used for other purposes; ask your health care provider or pharmacist if you have questions. COMMON BRAND NAME(S): Eligard, Fensolv, Lupron Depot, Lupron Depot-Ped, Viadur What should I tell my health care provider before I take this medicine? They need to know if you have any of these conditions:  diabetes  heart disease or previous heart attack  high blood pressure  high cholesterol  mental illness  osteoporosis  pain or difficulty passing urine  seizures  spinal cord metastasis  stroke  suicidal thoughts, plans, or attempt; a previous suicide attempt by you or a family member  tobacco smoker  unusual vaginal bleeding (women)  an unusual or allergic reaction to leuprolide, benzyl alcohol, other medicines, foods, dyes, or preservatives  pregnant or trying to get pregnant  breast-feeding How should I use this medicine? This medicine is for injection into a muscle or for injection under the skin. It is given by a health care professional in a hospital or clinic setting. The specific product will determine how it will be given to you.   Make sure you understand which product you receive and how often you will receive it. Talk to your pediatrician regarding the use of this medicine in children. Special care may be needed. Overdosage: If you think you have taken too much of this medicine contact a poison control center or emergency room at once. NOTE: This medicine is only for you. Do not share this medicine with others. What if I miss a dose? It is important not to miss a dose. Call your doctor or health care professional if you are unable to keep an appointment. Depot injections: Depot injections are given either once-monthly, every 12 weeks, every 16 weeks, or every 24 weeks depending  on the product you are prescribed. The product you are prescribed will be based on if you are male or male, and your condition. Make sure you understand your product and dosing. What may interact with this medicine? Do not take this medicine with any of the following medications:  chasteberry  cisapride  dronedarone  pimozide  thioridazine This medicine may also interact with the following medications:  herbal or dietary supplements, like black cohosh or DHEA  male hormones, like estrogens or progestins and birth control pills, patches, rings, or injections  male hormones, like testosterone  other medicines that prolong the QT interval (abnormal heart rhythm) This list may not describe all possible interactions. Give your health care provider a list of all the medicines, herbs, non-prescription drugs, or dietary supplements you use. Also tell them if you smoke, drink alcohol, or use illegal drugs. Some items may interact with your medicine. What should I watch for while using this medicine? Visit your doctor or health care professional for regular checks on your progress. During the first weeks of treatment, your symptoms may get worse, but then will improve as you continue your treatment. You may get hot flashes, increased bone pain, increased difficulty passing urine, or an aggravation of nerve symptoms. Discuss these effects with your doctor or health care professional, some of them may improve with continued use of this medicine. Male patients may experience a menstrual cycle or spotting during the first months of therapy with this medicine. If this continues, contact your doctor or health care professional. This medicine may increase blood sugar. Ask your healthcare provider if changes in diet or medicines are needed if you have diabetes. What side effects may I notice from receiving this medicine? Side effects that you should report to your doctor or health care professional  as soon as possible:  allergic reactions like skin rash, itching or hives, swelling of the face, lips, or tongue  breathing problems  chest pain  depression or memory disorders  pain in your legs or groin  pain at site where injected or implanted  seizures  severe headache  signs and symptoms of high blood sugar such as being more thirsty or hungry or having to urinate more than normal. You may also feel very tired or have blurry vision  swelling of the feet and legs  suicidal thoughts or other mood changes  visual changes  vomiting Side effects that usually do not require medical attention (report to your doctor or health care professional if they continue or are bothersome):  breast swelling or tenderness  decrease in sex drive or performance  diarrhea  hot flashes  loss of appetite  muscle, joint, or bone pains  nausea  redness or irritation at site where injected or implanted  skin problems or acne This list may not   describe all possible side effects. Call your doctor for medical advice about side effects. You may report side effects to FDA at 1-800-FDA-1088. Where should I keep my medicine? This drug is given in a hospital or clinic and will not be stored at home. NOTE: This sheet is a summary. It may not cover all possible information. If you have questions about this medicine, talk to your doctor, pharmacist, or health care provider.  2021 [employer]/Gold Standard (2018-12-30 10:35:13)  

## 2020-03-10 ENCOUNTER — Other Ambulatory Visit: Payer: Self-pay | Admitting: Hematology and Oncology

## 2020-03-10 ENCOUNTER — Other Ambulatory Visit: Payer: Self-pay | Admitting: *Deleted

## 2020-03-10 MED ORDER — SENNOSIDES-DOCUSATE SODIUM 8.6-50 MG PO TABS: 1.0000 | ORAL_TABLET | Freq: Two times a day (BID) | ORAL | 1 refills | Status: DC

## 2020-03-10 MED ORDER — PREDNISONE 5 MG PO TABS: 5.0000 mg | ORAL_TABLET | Freq: Every day | ORAL | 1 refills | Status: DC

## 2020-03-22 ENCOUNTER — Other Ambulatory Visit: Payer: Self-pay | Admitting: Hematology and Oncology

## 2020-03-22 ENCOUNTER — Inpatient Hospital Stay: Payer: Medicare Other | Attending: Hematology and Oncology | Admitting: Hematology and Oncology

## 2020-03-22 ENCOUNTER — Other Ambulatory Visit: Payer: Self-pay

## 2020-03-22 ENCOUNTER — Inpatient Hospital Stay: Payer: Medicare Other

## 2020-03-22 VITALS — BP 126/87 | HR 99 | Temp 97.5°F | Resp 16 | Ht 72.0 in | Wt 139.5 lb

## 2020-03-22 DIAGNOSIS — C61 Malignant neoplasm of prostate: Secondary | ICD-10-CM | POA: Insufficient documentation

## 2020-03-22 DIAGNOSIS — F17210 Nicotine dependence, cigarettes, uncomplicated: Secondary | ICD-10-CM | POA: Insufficient documentation

## 2020-03-22 DIAGNOSIS — C7951 Secondary malignant neoplasm of bone: Secondary | ICD-10-CM

## 2020-03-22 DIAGNOSIS — Z79899 Other long term (current) drug therapy: Secondary | ICD-10-CM | POA: Diagnosis not present

## 2020-03-22 DIAGNOSIS — N529 Male erectile dysfunction, unspecified: Secondary | ICD-10-CM | POA: Insufficient documentation

## 2020-03-22 LAB — CMP (CANCER CENTER ONLY)
ALT: 8 U/L (ref 0–44)
AST: 12 U/L — ABNORMAL LOW (ref 15–41)
Albumin: 3.6 g/dL (ref 3.5–5.0)
Alkaline Phosphatase: 93 U/L (ref 38–126)
Anion gap: 7 (ref 5–15)
BUN: 8 mg/dL (ref 8–23)
CO2: 29 mmol/L (ref 22–32)
Calcium: 9.4 mg/dL (ref 8.9–10.3)
Chloride: 101 mmol/L (ref 98–111)
Creatinine: 0.77 mg/dL (ref 0.61–1.24)
GFR, Estimated: >60 mL/min (ref >60)
Glucose, Bld: 111 mg/dL — ABNORMAL HIGH (ref 70–99)
Potassium: 4.5 mmol/L (ref 3.5–5.1)
Sodium: 137 mmol/L (ref 135–145)
Total Bilirubin: 0.3 mg/dL (ref 0.3–1.2)
Total Protein: 7.6 g/dL (ref 6.5–8.1)

## 2020-03-22 LAB — CBC WITH DIFFERENTIAL (CANCER CENTER ONLY)
Abs Immature Granulocytes: 0.01 10*3/uL (ref 0.00–0.07)
Basophils Absolute: 0 10*3/uL (ref 0.0–0.1)
Basophils Relative: 0 %
Eosinophils Absolute: 0 10*3/uL (ref 0.0–0.5)
Eosinophils Relative: 1 %
HCT: 33.3 % — ABNORMAL LOW (ref 39.0–52.0)
Hemoglobin: 11 g/dL — ABNORMAL LOW (ref 13.0–17.0)
Immature Granulocytes: 0 %
Lymphocytes Relative: 9 %
Lymphs Abs: 0.4 10*3/uL — ABNORMAL LOW (ref 0.7–4.0)
MCH: 27.2 pg (ref 26.0–34.0)
MCHC: 33 g/dL (ref 30.0–36.0)
MCV: 82.2 fL (ref 80.0–100.0)
Monocytes Absolute: 0.5 10*3/uL (ref 0.1–1.0)
Monocytes Relative: 12 %
Neutro Abs: 3.4 10*3/uL (ref 1.7–7.7)
Neutrophils Relative %: 78 %
Platelet Count: 252 10*3/uL (ref 150–400)
RBC: 4.05 MIL/uL — ABNORMAL LOW (ref 4.22–5.81)
RDW: 15.6 % — ABNORMAL HIGH (ref 11.5–15.5)
WBC Count: 4.3 10*3/uL (ref 4.0–10.5)
nRBC: 0 % (ref 0.0–0.2)

## 2020-03-22 NOTE — Progress Notes (Signed)
[facility] [phone]   [phone]  PROGRESS NOTE  Patient Care Team: [name], MD as PCP - General (Internal Medicine) Cira Rue, RN Nurse Navigator as Registered Nurse (Medical Oncology)  Hematological/Oncological History # Metastatic Castrate Sensitive Prostate Cancer, Metastatic to Bone 1) 07/13/2019: Abdomen/Pelvis CT extensive lytic changes with superimposed pathological fractures. Lytic change noted in the right iliac bone and new lucencies in the right T10 vertebrae.  2) 07/19/2019: biopsy of sacral mass shows metastatic prostatic adenocarcinoma, Gleason 4+4.  3) 07/2019: reportedly received Zometa 4g IV and eligard 22.5. Started on Casodex.  4) 6/10-6/16/2021: received palliative radiation to the sacrum at [place]. Received 2000cGy over 6 days 5) Moved to [place]. Lost to follow up from [facility] in [place], [place]. 6) 11/05/2019: establish care with Dr. Lorenso Courier  7) 11/16/2019: Administered Zometa 4mg  IV and Lupron 22.5 mg 8) 03/09/2020: Administered Zometa 4mg  IV and Lupron 22.5 mg  Interval History:  Dustin Alvarez 65 y.o. male with medical history significant for metastatic prostate cancer who presents for a follow up visit. The patient's last visit was on 02/24/2020. In the interim since the last visit he received his Lupron shot and Zometa on 03/09/2020.  On exam today [name] reports he still continues to struggle with pain.  He reports his pain is approximately a 7 out of 10 in severity.  He also notes that he has very cold feet which he thinks may be related to his pain.  He notes that his weight is good and that his appetite has also been stable.  He has been doing his best to exercise and stretch thinking that might help his back pain.  He had a bad experience with his OxyContin at 40 mg twice daily and is requesting it be decreased back down to 30 mg twice daily.  He believes that this causes severe constipation  which took multiple medications in order to unblock.   He otherwise denies any fevers, chills, sweats, nausea, vomiting or diarrhea.  He notes he is tolerating abiraterone therapy well and has no questions or concerns regarding his care.  A full 10 point ROS is listed below.  MEDICAL HISTORY:  Past Medical History:  Diagnosis Date  . Asthma   . COPD (chronic obstructive pulmonary disease) ([place])   . Hypertension   . Prostate cancer [facility]     SURGICAL HISTORY: Past Surgical History:  Procedure Laterality Date  . WRIST SURGERY Left 2017    SOCIAL HISTORY: Social History   Socioeconomic History  . Marital status: Legally Separated    Spouse name: Not on file  . Number of children: Not on file  . Years of education: Not on file  . Highest education level: Not on file  Occupational History  . Not on file  Tobacco Use  . Smoking status: Current Every Day Smoker    Packs/day: 1.00  . Smokeless tobacco: Never Used  . Tobacco comment: almost 1 pkd  Vaping Use  . Vaping Use: Never used  Substance and Sexual Activity  . Alcohol use: Yes    Comment: 1-2 drinks per week.  . Drug use: Never  . Sexual activity: Not Currently  Other Topics Concern  . Not on file  Social History Narrative  . Not on file   Social Determinants of Health   Financial Resource Strain: Not on file  Food Insecurity: Not on file  Transportation Needs: Not on file  Physical Activity: Not on file  Stress: Not on file  Social Connections: Not on file  Intimate Partner Violence: Not on file    FAMILY HISTORY: Family History  Problem Relation Age of Onset  . COPD Father   . Diabetes Sister   . Diabetes Brother   . Breast cancer Neg Hx   . Prostate cancer Neg Hx   . Colon cancer Neg Hx   . Pancreatic cancer Neg Hx     ALLERGIES:  is allergic to ace inhibitors.  MEDICATIONS:  Current Outpatient Medications  Medication Sig Dispense Refill  . abiraterone acetate (ZYTIGA) 250 MG tablet TAKE 4  TABLETS (1,000 MG TOTAL) BY MOUTH DAILY. TAKE ON AN EMPTY STOMACH 1 HOUR BEFORE OR 2 HOURS AFTER A MEAL 120 tablet 0  . albuterol (VENTOLIN HFA) 108 (90 Base) MCG/ACT inhaler Inhale 2 puffs into the lungs every 6 (six) hours as needed for wheezing or shortness of breath. 8 g 3  . amLODipine (NORVASC) 5 MG tablet Take 1 tablet (5 mg total) by mouth daily. 90 tablet 3  . calcium-vitamin D (OSCAL WITH D) 500-200 MG-UNIT tablet Take 1 tablet by mouth daily with breakfast. 90 tablet 3  . finasteride (PROSCAR) 5 MG tablet Take 1 tablet (5 mg total) by mouth daily. 90 tablet 1  . Fluticasone-Umeclidin-Vilant (TRELEGY ELLIPTA) 100-62.5-25 MCG/INH AEPB Inhale 2 puffs into the lungs daily. 180 each 0  . gabapentin (NEURONTIN) 300 MG capsule Take 1 capsule (300 mg total) by mouth 2 (two) times daily. 60 capsule 2  . hydrochlorothiazide (MICROZIDE) 12.5 MG capsule Take 1 capsule (12.5 mg total) by mouth daily. 90 capsule 1  . lisinopril (ZESTRIL) 20 MG tablet Take 20 mg by mouth daily.    Marland Kitchen oxyCODONE (OXY IR/ROXICODONE) 5 MG immediate release tablet Take 1 tablet (5 mg total) by mouth every 4 (four) hours as needed. 180 tablet 0  . oxyCODONE (OXYCONTIN) 40 mg 12 hr tablet Take 1 tablet (40 mg total) by mouth every 12 (twelve) hours. 60 tablet 0  . pantoprazole (PROTONIX) 40 MG tablet Take 1 tablet (40 mg total) by mouth daily. 90 tablet 3  . predniSONE (DELTASONE) 5 MG tablet Take 1 tablet (5 mg total) by mouth daily with breakfast. (do not take prior to starting Abiraterone) 90 tablet 1  . senna-docusate (SENOKOT-S) 8.6-50 MG tablet Take 1 tablet by mouth 2 (two) times daily. 60 tablet 1  . sildenafil (VIAGRA) 50 MG tablet Take 1 tablet (50 mg total) by mouth daily as needed for erectile dysfunction. 10 tablet 0  . SYMBICORT 160-4.5 MCG/ACT inhaler Inhale 2 puffs into the lungs 2 (two) times daily. 10.2 g 2  . tamsulosin (FLOMAX) 0.4 MG CAPS capsule Take 1 capsule (0.4 mg total) by mouth in the morning and at  bedtime. 60 capsule 0  . traZODone (DESYREL) 50 MG tablet Take 1 tablet (50 mg total) by mouth at bedtime. 30 tablet 0   No current facility-administered medications for this visit.    REVIEW OF SYSTEMS:   Constitutional: ( - ) fevers, ( - )  chills , ( - ) night sweats Eyes: ( - ) blurriness of vision, ( - ) double vision, ( - ) watery eyes Ears, nose, mouth, throat, and face: ( - ) mucositis, ( - ) sore throat Respiratory: ( - ) cough, ( - ) dyspnea, ( - ) wheezes Cardiovascular: ( - ) palpitation, ( - ) chest discomfort, ( - ) lower extremity swelling Gastrointestinal:  ( - ) nausea, ( - )  heartburn, ( - ) change in bowel habits Skin: ( - ) abnormal skin rashes Lymphatics: ( - ) new lymphadenopathy, ( - ) easy bruising Neurological: ( - ) numbness, ( - ) tingling, ( - ) new weaknesses Behavioral/Psych: ( - ) mood change, ( - ) new changes  All other systems were reviewed with the patient and are negative.  PHYSICAL EXAMINATION:  Vitals:   03/22/20 1445  BP: 126/87  Pulse: 99  Resp: 16  Temp: (!) 97.5 F (36.4 C)  SpO2: 98%   Filed Weights   03/22/20 1445  Weight: 139 lb 8 oz (63.3 kg)    GENERAL: well appearing elderly African American male in NAD. SKIN: skin color, texture, turgor are normal, no rashes or significant lesions EYES: conjunctiva are pink and non-injected, sclera clear LUNGS: clear to auscultation and percussion with normal breathing effort HEART: regular rate & rhythm and no murmurs and no lower extremity edema Musculoskeletal: no cyanosis of digits and no clubbing  PSYCH: alert & oriented x 3, fluent speech NEURO: no focal motor/sensory deficits  LABORATORY DATA:  I have reviewed the data as listed CBC Latest Ref Rng & Units 03/22/2020 03/09/2020 02/24/2020  WBC 4.0 - 10.5 K/uL 4.3 2.8(L) 3.8(L)  Hemoglobin 13.0 - 17.0 g/dL 11.0(L) 11.6(L) 11.5(L)  Hematocrit 39.0 - 52.0 % 33.3(L) 34.5(L) 34.9(L)  Platelets 150 - 400 K/uL 252 281 269    CMP Latest  Ref Rng & Units 03/22/2020 03/09/2020 02/24/2020  Glucose 70 - 99 mg/dL 111(H) 128(H) 111(H)  BUN 8 - 23 mg/dL 8 9 9   Creatinine 0.61 - 1.24 mg/dL 0.77 0.84 0.81  Sodium 135 - 145 mmol/L 137 138 140  Potassium 3.5 - 5.1 mmol/L 4.5 3.1(L) 3.6  Chloride 98 - 111 mmol/L 101 103 101  CO2 22 - 32 mmol/L 29 28 29   Calcium 8.9 - 10.3 mg/dL 9.4 9.2 9.7  Total Protein 6.5 - 8.1 g/dL 7.6 7.4 7.7  Total Bilirubin 0.3 - 1.2 mg/dL 0.3 0.4 0.4  Alkaline Phos 38 - 126 U/L 93 78 80  AST 15 - 41 U/L 12(L) 13(L) 11(L)  ALT 0 - 44 U/L 8 10 7     RADIOGRAPHIC STUDIES: No results found.  ASSESSMENT & PLAN Dustin Alvarez 65 y.o. male with medical history significant for metastatic prostate cancer who presents for a follow up visit.  Mr. Dattilo has excessively transitioned off the Casodex and has received his first dosages of Zometa and Lupron.  His PSA continues to trend downward and is testosterone is at castrate level.  Given this we started abiraterone therapy 1000 mg p.o. daily with 5 mg of prednisone.  In the interim he has completed palliative radiation therapy.   The major symptom the patient has been experiencing has been pain.  His spinal sacral lesion has been poorly controlled on oxycodone therapy.  He did previously receive radiation therapy in June 2021, but due to continued excruciating pain we asked for him to be reexamined by radiation oncology to see if there is any further intervention they can offer at this time. He has had further palliative radiation, though this has not relieved his pain. Recommend he decrease his OxyContin to 30 mg twice daily (per his request, issues with constipation) and additionally continue his as needed fast acting oxycodone. We will continue gabapentin at 300mg  BID.  Due to the need for monitoring of abiraterone we will plan to have him back in 2 weeks for lab visit and in 4  weeks for clinic visit.  PSA 07/13/2019: PSA 819 08/27/2019: 226 11/05/2019: 44.8 12/08/2019:  27.3 12/27/2019: 21.2  02/24/2020: 12.1 03/22/2020: PSA 8.8  # Metastatic Castrate Sensitive Prostate Cancer, Metastatic to Bone --findings are most consistent with metastatic adenocarcinoma of the prostate.  --testosterone at last visit was <3, PSA down to 8.8 ( from 819 at diagnosis)  --Administered Zometa 4mg  IV and Lupron 22.5 mg on 03/09/2020. Next due March 2022 (3 month intervals).  --continue abiraterone 1000mg  with prednisone 5mg  PO daily. Assure he is compliant with the full dose and steroids.  --patient due for a repeat CT scan and NM bone scan. Repeat q 3 months  --RTC in 4 weeks with interval 2 week lab check on Abiraterone therapy   #Symptom Management --patient requested a refill of viagra due to issues with ED  --gabapentin 300 mg BID to help with pain.  --continue oxycodone 5-10mg  q4H PRN for pain control. --continue oxycontin 30mg  q12H for basal pain control --established care with Radiation Oncology to help with painful bone lesions. Radiation therapy completed 02/01/2021.   Orders Placed This Encounter  Procedures  . CT CHEST ABDOMEN PELVIS W CONTRAST    Standing Status:   Future    Standing Expiration Date:   03/22/2021    Order Specific Question:   If indicated for the ordered procedure, I authorize the administration of contrast media per Radiology protocol    Answer:   Yes    Order Specific Question:   Preferred imaging location?    Answer:   [facility]    Order Specific Question:   Is Oral Contrast requested for this exam?    Answer:   Yes, Per Radiology protocol    Order Specific Question:   Reason for Exam (SYMPTOM  OR DIAGNOSIS REQUIRED)    Answer:   metastatic prostate cancer, assess for progression  . NM Bone Scan Whole Body    Standing Status:   Future    Standing Expiration Date:   03/22/2021    Order Specific Question:   If indicated for the ordered procedure, I authorize the administration of a radiopharmaceutical per Radiology protocol     Answer:   Yes    Order Specific Question:   Preferred imaging location?    Answer:   [facility]    All questions were answered. The patient knows to call the clinic with any problems, questions or concerns.  A total of more than 30 minutes were spent on this encounter and over half of that time was spent on counseling and coordination of care as outlined above.   Ledell Peoples, MD Department of Hematology/Oncology [place] at [facility] Phone: [phone] Pager: [phone] Email: [email or web address]  03/22/2020 3:42 PM

## 2020-03-23 LAB — PROSTATE-SPECIFIC AG, SERUM (LABCORP): Prostate Specific Ag, Serum: 8.8 ng/mL — ABNORMAL HIGH (ref 0.0–4.0)

## 2020-03-23 LAB — TESTOSTERONE: Testosterone: <3 ng/dL — ABNORMAL LOW (ref 264–916)

## 2020-03-27 ENCOUNTER — Other Ambulatory Visit: Payer: Self-pay | Admitting: Hematology and Oncology

## 2020-03-27 ENCOUNTER — Telehealth: Payer: Self-pay | Admitting: Hematology and Oncology

## 2020-03-27 MED FILL — ABIRATERONE ACETATE 250 MG: 250 | 30 days supply | Qty: 120 | Fill #0

## 2020-03-27 NOTE — Telephone Encounter (Signed)
Called pt per 2/12 sch msg - pt friend aware of appts.

## 2020-03-29 ENCOUNTER — Ambulatory Visit (HOSPITAL_COMMUNITY)
Admission: RE | Admit: 2020-03-29 | Discharge: 2020-03-29 | Disposition: A | Discharge status: Home / Self Care | Payer: Medicare Other | Source: Ambulatory Visit | Attending: Hematology and Oncology | Admitting: Hematology and Oncology

## 2020-03-29 ENCOUNTER — Other Ambulatory Visit: Payer: Self-pay

## 2020-03-29 ENCOUNTER — Encounter (HOSPITAL_COMMUNITY)
Admission: RE | Admit: 2020-03-29 | Discharge: 2020-03-29 | Disposition: A | Discharge status: Home / Self Care | Payer: Medicare Other | Source: Ambulatory Visit | Attending: Hematology and Oncology | Admitting: Hematology and Oncology

## 2020-03-29 DIAGNOSIS — C61 Malignant neoplasm of prostate: Secondary | ICD-10-CM | POA: Diagnosis not present

## 2020-03-29 DIAGNOSIS — C7951 Secondary malignant neoplasm of bone: Secondary | ICD-10-CM | POA: Diagnosis present

## 2020-03-29 MED ORDER — TECHNETIUM TC 99M MEDRONATE IV KIT
19.6000 | PACK | Freq: Once | INTRAVENOUS | Status: AC | PRN
  Administered 2020-03-29: 19.6 via INTRAVENOUS

## 2020-04-03 ENCOUNTER — Other Ambulatory Visit: Payer: Self-pay | Admitting: Internal Medicine

## 2020-04-03 DIAGNOSIS — I10 Essential (primary) hypertension: Secondary | ICD-10-CM

## 2020-04-04 ENCOUNTER — Telehealth: Payer: Self-pay | Admitting: Internal Medicine

## 2020-04-04 ENCOUNTER — Other Ambulatory Visit: Payer: Self-pay

## 2020-04-04 ENCOUNTER — Telehealth: Payer: Self-pay

## 2020-04-04 NOTE — Telephone Encounter (Signed)
Refill Request   gabapentin (NEURONTIN) 300 MG capsule  pantoprazole (PROTONIX) 40 MG tablet  traZODone (DESYREL) 50 MG tablet  predniSONE (DELTASONE) 5 MG tablet  finasteride (PROSCAR) 5 MG tablet  tamsulosin (FLOMAX) 0.4 MG CAPS capsule  hydrochlorothiazide (MICROZIDE) 12.5 MG capsule  amLODipine (NORVASC) 5 MG tablet   Dustin Alvarez, Dustin Alvarez (Ph: [phone])

## 2020-04-04 NOTE — Telephone Encounter (Signed)
Gabapentin sent by outside provider to [facility] on 02/24/2020 with 2 refills.  All other meds have refills available at [facility] except trazodone and flomax. Triage has already forwarded these 2 requests. Patient's friend, Artist, notified to call Kalman Shan for refills on available meds. She will do that now. She states she has to p/u patient's pain med at [facility]. She will call them now to get refill on gabapentin ready.

## 2020-04-04 NOTE — Telephone Encounter (Signed)
Pts friend Scientist, product/process development) called requesting a refill of Oxycodone 5mg  tablets. She states he has run out of these.  She also states he needs the contrast for his CT scan tomorrow. I advised she can pick it up from the [facility] or from the Radiology department. She states she will pick it up today from the [facility]. I advised I will leave it with the pts name on it at the front desk but she would need to be here by 4pm, otherwise she can obtain it from the radiology department. She expressed understanding of this information.

## 2020-04-05 ENCOUNTER — Other Ambulatory Visit: Payer: Self-pay

## 2020-04-05 ENCOUNTER — Inpatient Hospital Stay: Payer: Medicare Other

## 2020-04-05 ENCOUNTER — Ambulatory Visit (HOSPITAL_COMMUNITY)
Admission: RE | Admit: 2020-04-05 | Discharge: 2020-04-05 | Disposition: A | Discharge status: Home / Self Care | Payer: Medicare Other | Source: Ambulatory Visit | Attending: Hematology and Oncology | Admitting: Hematology and Oncology

## 2020-04-05 ENCOUNTER — Other Ambulatory Visit: Payer: Self-pay | Admitting: Hematology and Oncology

## 2020-04-05 DIAGNOSIS — C7951 Secondary malignant neoplasm of bone: Secondary | ICD-10-CM | POA: Insufficient documentation

## 2020-04-05 DIAGNOSIS — C61 Malignant neoplasm of prostate: Secondary | ICD-10-CM

## 2020-04-05 LAB — CMP (CANCER CENTER ONLY)
ALT: 6 U/L (ref 0–44)
AST: 11 U/L — ABNORMAL LOW (ref 15–41)
Albumin: 3.6 g/dL (ref 3.5–5.0)
Alkaline Phosphatase: 81 U/L (ref 38–126)
Anion gap: 9 (ref 5–15)
BUN: 12 mg/dL (ref 8–23)
CO2: 27 mmol/L (ref 22–32)
Calcium: 8.5 mg/dL — ABNORMAL LOW (ref 8.9–10.3)
Chloride: 103 mmol/L (ref 98–111)
Creatinine: 0.79 mg/dL (ref 0.61–1.24)
GFR, Estimated: >60 mL/min (ref >60)
Glucose, Bld: 104 mg/dL — ABNORMAL HIGH (ref 70–99)
Potassium: 3.5 mmol/L (ref 3.5–5.1)
Sodium: 139 mmol/L (ref 135–145)
Total Bilirubin: 0.4 mg/dL (ref 0.3–1.2)
Total Protein: 7.4 g/dL (ref 6.5–8.1)

## 2020-04-05 LAB — CBC WITH DIFFERENTIAL (CANCER CENTER ONLY)
Abs Immature Granulocytes: 0 10*3/uL (ref 0.00–0.07)
Basophils Absolute: 0 10*3/uL (ref 0.0–0.1)
Basophils Relative: 0 %
Eosinophils Absolute: 0 10*3/uL (ref 0.0–0.5)
Eosinophils Relative: 1 %
HCT: 35.2 % — ABNORMAL LOW (ref 39.0–52.0)
Hemoglobin: 11.4 g/dL — ABNORMAL LOW (ref 13.0–17.0)
Immature Granulocytes: 0 %
Lymphocytes Relative: 9 %
Lymphs Abs: 0.4 10*3/uL — ABNORMAL LOW (ref 0.7–4.0)
MCH: 27.1 pg (ref 26.0–34.0)
MCHC: 32.4 g/dL (ref 30.0–36.0)
MCV: 83.6 fL (ref 80.0–100.0)
Monocytes Absolute: 0.5 10*3/uL (ref 0.1–1.0)
Monocytes Relative: 12 %
Neutro Abs: 3.1 10*3/uL (ref 1.7–7.7)
Neutrophils Relative %: 78 %
Platelet Count: 304 10*3/uL (ref 150–400)
RBC: 4.21 MIL/uL — ABNORMAL LOW (ref 4.22–5.81)
RDW: 17.1 % — ABNORMAL HIGH (ref 11.5–15.5)
WBC Count: 4 10*3/uL (ref 4.0–10.5)
nRBC: 0 % (ref 0.0–0.2)

## 2020-04-05 MED ORDER — IOHEXOL 300 MG/ML  SOLN
75.0000 mL | Freq: Once | INTRAMUSCULAR | Status: AC | PRN
  Administered 2020-04-05: 75 mL via INTRAVENOUS

## 2020-04-05 MED ORDER — TRAZODONE HCL 50 MG PO TABS: 50.0000 mg | ORAL_TABLET | Freq: Every day | ORAL | 0 refills | Status: DC

## 2020-04-05 MED ORDER — TAMSULOSIN HCL 0.4 MG PO CAPS: 0.4000 mg | ORAL_CAPSULE | Freq: Two times a day (BID) | ORAL | 0 refills | Status: DC

## 2020-04-06 ENCOUNTER — Other Ambulatory Visit: Payer: Self-pay | Admitting: Hematology and Oncology

## 2020-04-06 ENCOUNTER — Telehealth: Payer: Self-pay | Admitting: *Deleted

## 2020-04-06 NOTE — Telephone Encounter (Signed)
TCT patient regarding recent lab results. Spoke with his daughter, [name]. Informed her that her father's scan results stable disease and no new bone lesions. Advised that Dr. Lorenso Courier states that along with a decreasing PSA,  pt is having a good response to his current therapy. [name] voiced understanding.  [name] states that patient needs a refill of his oxycodone 5 mg tabs. To [facility].  Dr. Lorenso Courier made aware of this

## 2020-04-06 NOTE — Telephone Encounter (Signed)
Pt. Needs refill

## 2020-04-06 NOTE — Telephone Encounter (Signed)
-----   Message from [place], MD sent at 04/06/2020 10:35 AM EST ----- Please let Mr. Illes know that this bone scan and CT scan show stable metastatic disease with no sign of new spread or new bone lesions. This combined with his decreasing PSA shows a good response to therapy.  ----- Message ----- From: Interface, Rad Results In Sent: 04/05/2020   9:21 PM EST To: [name], MD

## 2020-04-07 ENCOUNTER — Other Ambulatory Visit: Payer: Self-pay | Admitting: Hematology and Oncology

## 2020-04-07 MED FILL — oxyCODONE HCL 5 MG TABS: 5 | 30 days supply | Qty: 180 | Fill #0

## 2020-04-12 ENCOUNTER — Telehealth: Payer: Self-pay | Admitting: *Deleted

## 2020-04-12 NOTE — Telephone Encounter (Signed)
Received vm message from patient's daughter, [name]. She is calling regarding authorization of pt's oxycontin. Relayed the message to Eloise Levels, RN as she works on the prior authorizations. Rosalnid called the daughter and explained the process. Still awaiting authorization.

## 2020-04-14 MED FILL — OxyCONTIN 40 MG T12A: 40 | 23 days supply | Qty: 46 | Fill #0

## 2020-04-17 ENCOUNTER — Telehealth: Payer: Self-pay | Admitting: *Deleted

## 2020-04-17 NOTE — Telephone Encounter (Signed)
Oxy ER 40 mg PA no: [id number] approved 04/13/2020 through 02/10/2038 per [facility] faxed notification received today sent 04/13/2020. Dustin Alvarez Outpatient pharmacist, Gaspar Bidding notified.  Reports filled last week has been picked up.

## 2020-04-17 NOTE — Telephone Encounter (Signed)
TCT to pt's daughter, [name]. Spoke with her and advised that Randi's  oxycontin has been approved through 2039. [name] very pleased to hear this. She has been able to pick this up from the pharmacy.

## 2020-04-20 ENCOUNTER — Other Ambulatory Visit: Payer: Self-pay | Admitting: *Deleted

## 2020-04-20 ENCOUNTER — Inpatient Hospital Stay: Payer: Medicare Other

## 2020-04-20 ENCOUNTER — Encounter: Payer: Self-pay | Admitting: Hematology and Oncology

## 2020-04-20 ENCOUNTER — Inpatient Hospital Stay: Payer: Medicare Other | Attending: Hematology and Oncology | Admitting: Hematology and Oncology

## 2020-04-20 ENCOUNTER — Other Ambulatory Visit: Payer: Self-pay

## 2020-04-20 ENCOUNTER — Other Ambulatory Visit: Payer: Self-pay | Admitting: Hematology and Oncology

## 2020-04-20 VITALS — BP 120/81 | HR 91 | Temp 97.6°F | Resp 16 | Ht 72.0 in | Wt 140.0 lb

## 2020-04-20 DIAGNOSIS — Z803 Family history of malignant neoplasm of breast: Secondary | ICD-10-CM | POA: Insufficient documentation

## 2020-04-20 DIAGNOSIS — Z923 Personal history of irradiation: Secondary | ICD-10-CM | POA: Diagnosis not present

## 2020-04-20 DIAGNOSIS — C7951 Secondary malignant neoplasm of bone: Secondary | ICD-10-CM

## 2020-04-20 DIAGNOSIS — C61 Malignant neoplasm of prostate: Secondary | ICD-10-CM

## 2020-04-20 DIAGNOSIS — Z833 Family history of diabetes mellitus: Secondary | ICD-10-CM | POA: Insufficient documentation

## 2020-04-20 DIAGNOSIS — Z836 Family history of other diseases of the respiratory system: Secondary | ICD-10-CM | POA: Diagnosis not present

## 2020-04-20 DIAGNOSIS — J432 Centrilobular emphysema: Secondary | ICD-10-CM | POA: Diagnosis not present

## 2020-04-20 DIAGNOSIS — I10 Essential (primary) hypertension: Secondary | ICD-10-CM | POA: Insufficient documentation

## 2020-04-20 DIAGNOSIS — M79606 Pain in leg, unspecified: Secondary | ICD-10-CM | POA: Diagnosis not present

## 2020-04-20 DIAGNOSIS — N522 Drug-induced erectile dysfunction: Secondary | ICD-10-CM

## 2020-04-20 DIAGNOSIS — F17210 Nicotine dependence, cigarettes, uncomplicated: Secondary | ICD-10-CM | POA: Diagnosis not present

## 2020-04-20 DIAGNOSIS — Z79899 Other long term (current) drug therapy: Secondary | ICD-10-CM | POA: Insufficient documentation

## 2020-04-20 DIAGNOSIS — K5900 Constipation, unspecified: Secondary | ICD-10-CM | POA: Diagnosis not present

## 2020-04-20 LAB — CBC WITH DIFFERENTIAL (CANCER CENTER ONLY)
Abs Immature Granulocytes: 0.01 10*3/uL (ref 0.00–0.07)
Basophils Absolute: 0 10*3/uL (ref 0.0–0.1)
Basophils Relative: 0 %
Eosinophils Absolute: 0 10*3/uL (ref 0.0–0.5)
Eosinophils Relative: 1 %
HCT: 33.4 % — ABNORMAL LOW (ref 39.0–52.0)
Hemoglobin: 11.1 g/dL — ABNORMAL LOW (ref 13.0–17.0)
Immature Granulocytes: 0 %
Lymphocytes Relative: 10 %
Lymphs Abs: 0.4 10*3/uL — ABNORMAL LOW (ref 0.7–4.0)
MCH: 27.9 pg (ref 26.0–34.0)
MCHC: 33.2 g/dL (ref 30.0–36.0)
MCV: 83.9 fL (ref 80.0–100.0)
Monocytes Absolute: 0.4 10*3/uL (ref 0.1–1.0)
Monocytes Relative: 11 %
Neutro Abs: 3 10*3/uL (ref 1.7–7.7)
Neutrophils Relative %: 78 %
Platelet Count: 277 10*3/uL (ref 150–400)
RBC: 3.98 MIL/uL — ABNORMAL LOW (ref 4.22–5.81)
RDW: 17.3 % — ABNORMAL HIGH (ref 11.5–15.5)
WBC Count: 3.8 10*3/uL — ABNORMAL LOW (ref 4.0–10.5)
nRBC: 0 % (ref 0.0–0.2)

## 2020-04-20 LAB — CMP (CANCER CENTER ONLY)
ALT: 9 U/L (ref 0–44)
AST: 11 U/L — ABNORMAL LOW (ref 15–41)
Albumin: 3.9 g/dL (ref 3.5–5.0)
Alkaline Phosphatase: 86 U/L (ref 38–126)
Anion gap: 5 (ref 5–15)
BUN: 9 mg/dL (ref 8–23)
CO2: 28 mmol/L (ref 22–32)
Calcium: 9.1 mg/dL (ref 8.9–10.3)
Chloride: 104 mmol/L (ref 98–111)
Creatinine: 0.9 mg/dL (ref 0.61–1.24)
GFR, Estimated: >60 mL/min (ref >60)
Glucose, Bld: 131 mg/dL — ABNORMAL HIGH (ref 70–99)
Potassium: 3.4 mmol/L — ABNORMAL LOW (ref 3.5–5.1)
Sodium: 137 mmol/L (ref 135–145)
Total Bilirubin: 0.4 mg/dL (ref 0.3–1.2)
Total Protein: 7.5 g/dL (ref 6.5–8.1)

## 2020-04-20 MED ORDER — SILDENAFIL CITRATE 50 MG PO TABS: 50.0000 mg | ORAL_TABLET | Freq: Every day | ORAL | 0 refills | Status: DC | PRN

## 2020-04-20 MED ORDER — GABAPENTIN 300 MG PO CAPS: 300.0000 mg | ORAL_CAPSULE | Freq: Two times a day (BID) | ORAL | 2 refills | Status: DC

## 2020-04-20 NOTE — Progress Notes (Signed)
Dustin Alvarez [phone]   [phone]  PROGRESS NOTE  Patient Care Team: [name], MD as PCP - General (Internal Medicine) Cira Rue, RN Nurse Navigator as Registered Nurse (Medical Oncology)  Hematological/Oncological History # Metastatic Castrate Sensitive Prostate Cancer, Metastatic to Bone 1) 07/13/2019: Abdomen/Pelvis CT extensive lytic changes with superimposed pathological fractures. Lytic change noted in the right iliac bone and new lucencies in the right T10 vertebrae.  2) 07/19/2019: biopsy of sacral mass shows metastatic prostatic adenocarcinoma, Gleason 4+4.  3) 07/2019: reportedly received Zometa 4g IV and eligard 22.5. Started on Casodex.  4) 6/10-6/16/2021: received palliative radiation to the sacrum at [facility]. Received 2000cGy over 6 days 5) Moved to [place]. Lost to follow up from [facility] in [place], [place]. 6) 11/05/2019: establish care with Dr. Lorenso Courier  7) 11/16/2019: Administered Zometa 4mg  IV and Lupron 22.5 mg 8) 12/08/2019: started abiraterone 1000mg  PO daily  9) 03/09/2020: Administered Zometa 4mg  IV and Lupron 22.5 mg  Interval History:  Dustin Alvarez 65 y.o. male with medical history significant for metastatic prostate cancer who presents for a follow up visit. The patient's last visit was on 02/24/2020. In the interim since the last visit he received his Lupron shot and Zometa on 03/09/2020.  On exam today Dustin Alvarez reports he still continues to struggle with pain.  He reports his pain is an 8 out of 10 in severity.  He also notes that the pain can shoot down his legs.  He notes that his weight is good and that his appetite has also been stable.  He has also found the Viagra to be working well. He is on OxyContin 30 mg twice daily (per his request down from 40mg ).  He is also taking approximately 8 of the 5mg  oxycodones per day.    He otherwise denies any fevers, chills, sweats, nausea, vomiting or  diarrhea.  He notes he is tolerating abiraterone therapy well and has no questions or concerns regarding his care.  A full 10 point ROS is listed below.  MEDICAL HISTORY:  Past Medical History:  Diagnosis Date  . Asthma   . COPD (chronic obstructive pulmonary disease) ([place])   . Hypertension   . Prostate cancer [facility])     SURGICAL HISTORY: Past Surgical History:  Procedure Laterality Date  . WRIST SURGERY Left 2017    SOCIAL HISTORY: Social History   Socioeconomic History  . Marital status: Legally Separated    Spouse name: Not on file  . Number of children: Not on file  . Years of education: Not on file  . Highest education level: Not on file  Occupational History  . Not on file  Tobacco Use  . Smoking status: Current Every Day Smoker    Packs/day: 1.00  . Smokeless tobacco: Never Used  . Tobacco comment: almost 1 pkd  Vaping Use  . Vaping Use: Never used  Substance and Sexual Activity  . Alcohol use: Yes    Comment: 1-2 drinks per week.  . Drug use: Never  . Sexual activity: Not Currently  Other Topics Concern  . Not on file  Social History Narrative  . Not on file   Social Determinants of Health   Financial Resource Strain: Not on file  Food Insecurity: Not on file  Transportation Needs: Not on file  Physical Activity: Not on file  Stress: Not on file  Social Connections: Not on file  Intimate Partner Violence: Not on file    FAMILY HISTORY:  Family History  Problem Relation Age of Onset  . COPD Father   . Diabetes Sister   . Diabetes Brother   . Breast cancer Neg Hx   . Prostate cancer Neg Hx   . Colon cancer Neg Hx   . Pancreatic cancer Neg Hx     ALLERGIES:  is allergic to ace inhibitors.  MEDICATIONS:  Current Outpatient Medications  Medication Sig Dispense Refill  . abiraterone acetate (ZYTIGA) 250 MG tablet TAKE 4 TABLETS BY MOUTH DAILY. TAKE ON AN EMPTY STOMACH 1 HOUR BEFORE OR 2 HOURS AFTER A MEAL 120 tablet 0  . albuterol (VENTOLIN  HFA) 108 (90 Base) MCG/ACT inhaler Inhale 2 puffs into the lungs every 6 (six) hours as needed for wheezing or shortness of breath. 8.5 g 3  . amLODipine (NORVASC) 5 MG tablet Take 1 tablet (5 mg total) by mouth daily. 90 tablet 3  . calcium-vitamin D (OSCAL WITH D) 500-200 MG-UNIT tablet Take 1 tablet by mouth daily with breakfast. 90 tablet 3  . finasteride (PROSCAR) 5 MG tablet Take 1 tablet (5 mg total) by mouth daily. 90 tablet 1  . Fluticasone-Umeclidin-Vilant (TRELEGY ELLIPTA) 100-62.5-25 MCG/INH AEPB Inhale 2 puffs into the lungs daily. 180 each 0  . gabapentin (NEURONTIN) 300 MG capsule Take 1 capsule (300 mg total) by mouth 2 (two) times daily. 60 capsule 2  . hydrochlorothiazide (MICROZIDE) 12.5 MG capsule Take 1 capsule (12.5 mg total) by mouth daily. 90 capsule 1  . lisinopril (ZESTRIL) 20 MG tablet Take 20 mg by mouth daily.    [place] Kitchen oxyCODONE (OXY IR/ROXICODONE) 5 MG immediate release tablet TAKE 1 TABLET BY MOUTH EVERY 4 HOURS AS NEEDED 180 tablet 0  . OXYCONTIN 40 MG 12 hr tablet TAKE 1 TABLET BY MOUTH EVERY 12 HOURS 46 tablet 0  . pantoprazole (PROTONIX) 40 MG tablet Take 1 tablet (40 mg total) by mouth daily. 90 tablet 3  . predniSONE (DELTASONE) 5 MG tablet Take 1 tablet (5 mg total) by mouth daily with breakfast. (do not take prior to starting Abiraterone) 90 tablet 1  . senna-docusate (SENOKOT-S) 8.6-50 MG tablet Take 1 tablet by mouth 2 (two) times daily. 60 tablet 1  . sildenafil (VIAGRA) 50 MG tablet Take 1 tablet (50 mg total) by mouth daily as needed for erectile dysfunction. 10 tablet 0  . SYMBICORT 160-4.5 MCG/ACT inhaler Inhale 2 puffs into the lungs 2 (two) times daily. 10.2 g 2  . tamsulosin (FLOMAX) 0.4 MG CAPS capsule Take 1 capsule (0.4 mg total) by mouth in the morning and at bedtime. 60 capsule 0  . traZODone (DESYREL) 50 MG tablet Take 1 tablet (50 mg total) by mouth at bedtime. 30 tablet 0   No current facility-administered medications for this visit.    REVIEW  OF SYSTEMS:   Constitutional: ( - ) fevers, ( - )  chills , ( - ) night sweats Eyes: ( - ) blurriness of vision, ( - ) double vision, ( - ) watery eyes Ears, nose, mouth, throat, and face: ( - ) mucositis, ( - ) sore throat Respiratory: ( - ) cough, ( - ) dyspnea, ( - ) wheezes Cardiovascular: ( - ) palpitation, ( - ) chest discomfort, ( - ) lower extremity swelling Gastrointestinal:  ( - ) nausea, ( - ) heartburn, ( - ) change in bowel habits Skin: ( - ) abnormal skin rashes Lymphatics: ( - ) new lymphadenopathy, ( - ) easy bruising Neurological: ( - ) numbness, ( - )  tingling, ( - ) new weaknesses Behavioral/Psych: ( - ) mood change, ( - ) new changes  All other systems were reviewed with the patient and are negative.  PHYSICAL EXAMINATION:  Vitals:   04/20/20 1140  BP: 120/81  Pulse: 91  Resp: 16  Temp: 97.6 F (36.4 C)  SpO2: 99%   Filed Weights   04/20/20 1140  Weight: 140 lb (63.5 kg)    GENERAL: well appearing elderly African American male in NAD. SKIN: skin color, texture, turgor are normal, no rashes or significant lesions EYES: conjunctiva are pink and non-injected, sclera clear LUNGS: clear to auscultation and percussion with normal breathing effort HEART: regular rate & rhythm and no murmurs and no lower extremity edema Musculoskeletal: no cyanosis of digits and no clubbing  PSYCH: alert & oriented x 3, fluent speech NEURO: no focal motor/sensory deficits  LABORATORY DATA:  I have reviewed the data as listed CBC Latest Ref Rng & Units 04/20/2020 04/05/2020 03/22/2020  WBC 4.0 - 10.5 K/uL 3.8(L) 4.0 4.3  Hemoglobin 13.0 - 17.0 g/dL 11.1(L) 11.4(L) 11.0(L)  Hematocrit 39.0 - 52.0 % 33.4(L) 35.2(L) 33.3(L)  Platelets 150 - 400 K/uL 277 304 252    CMP Latest Ref Rng & Units 04/05/2020 03/22/2020 03/09/2020  Glucose 70 - 99 mg/dL 104(H) 111(H) 128(H)  BUN 8 - 23 mg/dL 12 8 9   Creatinine 0.61 - 1.24 mg/dL 0.79 0.77 0.84  Sodium 135 - 145 mmol/L 139 137 138  Potassium  3.5 - 5.1 mmol/L 3.5 4.5 3.1(L)  Chloride 98 - 111 mmol/L 103 101 103  CO2 22 - 32 mmol/L 27 29 28   Calcium 8.9 - 10.3 mg/dL 8.5(L) 9.4 9.2  Total Protein 6.5 - 8.1 g/dL 7.4 7.6 7.4  Total Bilirubin 0.3 - 1.2 mg/dL 0.4 0.3 0.4  Alkaline Phos 38 - 126 U/L 81 93 78  AST 15 - 41 U/L 11(L) 12(L) 13(L)  ALT 0 - 44 U/L 6 8 10     RADIOGRAPHIC STUDIES: NM Bone Scan Whole Body  Result Date: 03/29/2020 CLINICAL DATA:  Prostate cancer.  Assessment of treatment response. EXAM: NUCLEAR MEDICINE WHOLE BODY BONE SCAN TECHNIQUE: Whole body anterior and posterior images were obtained approximately 3 hours after intravenous injection of radiopharmaceutical. RADIOPHARMACEUTICALS:  19.6 mCi Technetium-51m MDP IV COMPARISON:  12/06/2019 FINDINGS: Again noted is uptake within the left seventh rib, likely representing a healing fracture. Again noted is extensive uptake throughout the patient's sacrum, similar to prior study. There is a new subtle focus involving the posterior T10 vertebral body on the left. There is chronic uptake about the shoulders and bilateral knees, likely degenerative. IMPRESSION: 1. Essentially unchanged uptake within the patient's sacrum as previously described. 2. Persistent uptake in the patient's left seventh rib, presumably representing a healing fracture. Electronically Signed   By: Constance Holster M.D.   On: 03/29/2020 20:47   CT CHEST ABDOMEN PELVIS W CONTRAST  Result Date: 04/05/2020 CLINICAL DATA:  Prostate cancer.  Assess response to treatment EXAM: CT CHEST, ABDOMEN, AND PELVIS WITH CONTRAST TECHNIQUE: Multidetector CT imaging of the chest, abdomen and pelvis was performed following the standard protocol during bolus administration of intravenous contrast. CONTRAST:  64mL OMNIPAQUE IOHEXOL 300 MG/ML  SOLN COMPARISON:  CT 10/21 /21, bone scan 03/29/2020 FINDINGS: CT CHEST FINDINGS Cardiovascular: No significant vascular findings. Normal heart size. No pericardial effusion.  Mediastinum/Nodes: No axillary supraclavicular adenopathy. No mediastinal hilar adenopathy esophagus and trachea normal. Lungs/Pleura: Centrilobular emphysema the upper lobes. Paraseptal emphysema in the RIGHT upper lobe.  No suspicious pulmonary nodules. Musculoskeletal: Round sclerotic lesion in the T10 vertebral body measures 15 mm flu for a slightly increased in density and conspicuity compared to prior. Healed rib fracture along the LEFT lateral seventh rib. CT ABDOMEN PELVIS FINDINGS Hepatobiliary: No focal hepatic lesion. No biliary duct dilatation. Common bile duct is normal. Pancreas: Pancreas is normal. No ductal dilatation. No pancreatic inflammation. Spleen: Normal spleen Adrenals/urinary tract: Adrenal glands and kidneys are normal. The ureters and bladder normal. Stomach/Bowel: Stomach, small bowel, and cecum are normal. The colon and rectosigmoid colon are normal. Vascular/Lymphatic: Abdominal aorta is normal caliber with atherosclerotic calcification. There is no retroperitoneal or periportal lymphadenopathy. No pelvic lymphadenopathy. No retroperitoneal lymphadenopathy.  Pelvic lymphadenopathy Reproductive: Prostate is unremarkable. Other: No free fluid. Musculoskeletal: Destructive lesion in the sacrum is unchanged. IMPRESSION: Chest Impression: 1. No evidence of soft tissue thoracic metastasis. 2. Centrilobular emphysema in the lungs Abdomen / Pelvis Impression: 1. No evidence of abdominopelvic lymphadenopathy. 2. No evidence of liver metastasis. 3. 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 4. Interval increase in conspicuity of skeletal metastasis in the T10 vertebral body. 5. Healed rib fracture in the lateral LEFT seventh rib. Electronically Signed   By: Suzy Bouchard M.D.   On: 04/05/2020 21:18    ASSESSMENT & PLAN Dustin Alvarez 65 y.o. male with medical history significant for metastatic prostate cancer who presents for a follow up visit.  Dustin Alvarez  has excessively transitioned off the Casodex and has received his first dosages of Zometa and Lupron.  His PSA continues to trend downward and is testosterone is at castrate level.  Given this we started abiraterone therapy 1000 mg p.o. daily with 5 mg of prednisone.  In the interim he has completed palliative radiation therapy.   The major symptom the patient has been experiencing has been pain.  His spinal sacral lesion has been poorly controlled on oxycodone therapy.  He did previously receive radiation therapy in June 2021, but due to continued excruciating pain we asked for him to be reexamined by radiation oncology to see if there is any further intervention they can offer at this time. He has had further palliative radiation, though this has not relieved his pain. Continue OxyContin to 30 mg twice daily (per his request, issues with constipation at higher doses) and additionally continue his as needed fast acting oxycodone. We will continue gabapentin at 300mg  BID.  Due to the need for monitoring of abiraterone we will plan to have him back in 4 weeks for clinic visit.  PSA 07/13/2019: PSA 819 08/27/2019: 226 11/05/2019: 44.8 12/08/2019: 27.3 12/27/2019: 21.2  02/24/2020: 12.1 03/22/2020: PSA 8.8  # Metastatic Castrate Sensitive Prostate Cancer, Metastatic to Bone --findings are most consistent with metastatic adenocarcinoma of the prostate.  --testosterone at last visit was <3, PSA down to 8.8 ( from 819 at diagnosis)  --Administered Zometa 4mg  IV and Lupron 22.5 mg on 03/09/2020. Next due April 2022 (3 month intervals).  --continue abiraterone 1000mg  with prednisone 5mg  PO daily. Assure he is compliant with the full dose and steroids.  --patient due for a repeat CT scan and NM bone scan. Repeat q 3 months  --RTC in 4 weeks with interval 2 week lab check on Abiraterone therapy   #Symptom Management --patient requested a refill of viagra due to issues with ED  --gabapentin 300 mg BID to help  with pain.  --continue oxycodone 5-10mg  q4H PRN for pain control. --continue oxycontin 30mg  q12H for basal pain control --  established care with Radiation Oncology to help with painful bone lesions. Radiation therapy completed 02/01/2021.  --referral to neurosurgery for consideration of nerve block  No orders of the defined types were placed in this encounter.   All questions were answered. The patient knows to call the clinic with any problems, questions or concerns.  A total of more than 30 minutes were spent on this encounter and over half of that time was spent on counseling and coordination of care as outlined above.   Ledell Peoples, MD Department of Hematology/Oncology [place] at [facility] Phone: [phone] Pager: [phone] Email: [email or web address]  04/20/2020 11:44 AM

## 2020-04-21 ENCOUNTER — Other Ambulatory Visit: Payer: Self-pay | Admitting: Hematology and Oncology

## 2020-04-21 LAB — PROSTATE-SPECIFIC AG, SERUM (LABCORP): Prostate Specific Ag, Serum: 4.8 ng/mL — ABNORMAL HIGH (ref 0.0–4.0)

## 2020-04-21 MED FILL — GABAPENTIN 300 MG CAPSULE: 300 | 30 days supply | Qty: 60 | Fill #1

## 2020-04-24 ENCOUNTER — Telehealth: Payer: Self-pay | Admitting: Hematology and Oncology

## 2020-04-24 NOTE — Telephone Encounter (Signed)
Scheduled per los. Called and spoke with someone on the phone who requested that I mail a calendar with the new appts.

## 2020-05-01 ENCOUNTER — Telehealth: Payer: Self-pay | Admitting: Hospice

## 2020-05-01 ENCOUNTER — Other Ambulatory Visit: Payer: Self-pay | Admitting: Internal Medicine

## 2020-05-01 ENCOUNTER — Other Ambulatory Visit: Payer: Self-pay | Admitting: Student

## 2020-05-01 DIAGNOSIS — C61 Malignant neoplasm of prostate: Secondary | ICD-10-CM

## 2020-05-01 NOTE — Telephone Encounter (Signed)
NP called patient for scheduled visit, call not picked; voicemail full and not accepting messages.

## 2020-05-05 ENCOUNTER — Telehealth: Payer: Self-pay | Admitting: *Deleted

## 2020-05-05 MED ORDER — SILDENAFIL CITRATE 50 MG PO TABS: 50.0000 mg | ORAL_TABLET | Freq: Every day | ORAL | 0 refills | Status: DC | PRN

## 2020-05-05 MED ORDER — OXYCODONE HCL 5 MG PO TABS: 5.0000 mg | ORAL_TABLET | ORAL | 0 refills | Status: DC | PRN

## 2020-05-05 NOTE — Telephone Encounter (Signed)
Sent refill of oxycodone 5 mg and brand name Viagra as requested.

## 2020-05-05 NOTE — Addendum Note (Signed)
Addended by: Lincoln Brigham on: 05/05/2020 03:33 PM   Modules accepted: Orders

## 2020-05-05 NOTE — Telephone Encounter (Signed)
Received call from pt's daughter requested refill of patient's Cialis and oxycodone 5 mg tabs.  Oxcodone was last filled on 04/06/20 for 180. At [facility].  He also requests the cialis be name brand not generic. States the generic does not work as well

## 2020-05-08 ENCOUNTER — Telehealth: Payer: Self-pay | Admitting: *Deleted

## 2020-05-08 NOTE — Telephone Encounter (Signed)
Received call from pt's daughter, [name]. She is requesting a refill of pt's Oxycontin from [facility]. Last filled on 04/07/20

## 2020-05-09 ENCOUNTER — Other Ambulatory Visit: Payer: Self-pay | Admitting: Hematology and Oncology

## 2020-05-09 ENCOUNTER — Other Ambulatory Visit (HOSPITAL_COMMUNITY): Payer: Self-pay

## 2020-05-09 MED ORDER — OXYCODONE HCL ER 40 MG PO T12A: 40.0000 mg | EXTENDED_RELEASE_TABLET | Freq: Two times a day (BID) | ORAL | 0 refills | Status: DC

## 2020-05-09 MED FILL — OxyCONTIN 40 MG T12A: 40 | 30 days supply | Qty: 60 | Fill #0

## 2020-05-15 ENCOUNTER — Other Ambulatory Visit: Payer: Self-pay | Admitting: Student

## 2020-05-15 ENCOUNTER — Other Ambulatory Visit: Payer: Self-pay | Admitting: Internal Medicine

## 2020-05-15 ENCOUNTER — Other Ambulatory Visit (HOSPITAL_COMMUNITY): Payer: Self-pay

## 2020-05-15 DIAGNOSIS — C7951 Secondary malignant neoplasm of bone: Secondary | ICD-10-CM

## 2020-05-15 DIAGNOSIS — C61 Malignant neoplasm of prostate: Secondary | ICD-10-CM

## 2020-05-15 DIAGNOSIS — J449 Chronic obstructive pulmonary disease, unspecified: Secondary | ICD-10-CM

## 2020-05-15 NOTE — Telephone Encounter (Signed)
Last visit 11/01/2019  No future appts scheduled Message sent to front office for scheduling

## 2020-05-17 NOTE — Telephone Encounter (Signed)
Appt has been sch for 05/22/2020 @ 9:45 am with Dr. Lisabeth Devoid.  Pt is aware

## 2020-05-18 ENCOUNTER — Encounter: Payer: Self-pay | Admitting: Hematology and Oncology

## 2020-05-18 ENCOUNTER — Inpatient Hospital Stay: Payer: Medicare Other

## 2020-05-18 ENCOUNTER — Inpatient Hospital Stay: Payer: Medicare Other | Attending: Hematology and Oncology | Admitting: Hematology and Oncology

## 2020-05-18 ENCOUNTER — Other Ambulatory Visit (HOSPITAL_COMMUNITY): Payer: Self-pay

## 2020-05-18 ENCOUNTER — Other Ambulatory Visit: Payer: Self-pay

## 2020-05-18 ENCOUNTER — Other Ambulatory Visit: Payer: Self-pay | Admitting: Hematology and Oncology

## 2020-05-18 VITALS — BP 117/79 | HR 90 | Temp 97.1°F | Resp 19 | Ht 72.0 in | Wt 138.6 lb

## 2020-05-18 DIAGNOSIS — Z833 Family history of diabetes mellitus: Secondary | ICD-10-CM | POA: Insufficient documentation

## 2020-05-18 DIAGNOSIS — Z923 Personal history of irradiation: Secondary | ICD-10-CM | POA: Insufficient documentation

## 2020-05-18 DIAGNOSIS — C61 Malignant neoplasm of prostate: Secondary | ICD-10-CM

## 2020-05-18 DIAGNOSIS — Z836 Family history of other diseases of the respiratory system: Secondary | ICD-10-CM | POA: Insufficient documentation

## 2020-05-18 DIAGNOSIS — Z803 Family history of malignant neoplasm of breast: Secondary | ICD-10-CM | POA: Diagnosis not present

## 2020-05-18 DIAGNOSIS — C7951 Secondary malignant neoplasm of bone: Secondary | ICD-10-CM | POA: Diagnosis present

## 2020-05-18 DIAGNOSIS — Z79899 Other long term (current) drug therapy: Secondary | ICD-10-CM | POA: Insufficient documentation

## 2020-05-18 DIAGNOSIS — K5900 Constipation, unspecified: Secondary | ICD-10-CM | POA: Insufficient documentation

## 2020-05-18 DIAGNOSIS — N522 Drug-induced erectile dysfunction: Secondary | ICD-10-CM | POA: Diagnosis not present

## 2020-05-18 DIAGNOSIS — F17210 Nicotine dependence, cigarettes, uncomplicated: Secondary | ICD-10-CM | POA: Diagnosis not present

## 2020-05-18 DIAGNOSIS — I10 Essential (primary) hypertension: Secondary | ICD-10-CM | POA: Insufficient documentation

## 2020-05-18 LAB — CMP (CANCER CENTER ONLY)
ALT: 8 U/L (ref 0–44)
AST: 12 U/L — ABNORMAL LOW (ref 15–41)
Albumin: 4.1 g/dL (ref 3.5–5.0)
Alkaline Phosphatase: 101 U/L (ref 38–126)
Anion gap: 11 (ref 5–15)
BUN: 14 mg/dL (ref 8–23)
CO2: 26 mmol/L (ref 22–32)
Calcium: 9.3 mg/dL (ref 8.9–10.3)
Chloride: 102 mmol/L (ref 98–111)
Creatinine: 0.86 mg/dL (ref 0.61–1.24)
GFR, Estimated: >60 mL/min (ref >60)
Glucose, Bld: 104 mg/dL — ABNORMAL HIGH (ref 70–99)
Potassium: 3.9 mmol/L (ref 3.5–5.1)
Sodium: 139 mmol/L (ref 135–145)
Total Bilirubin: 0.3 mg/dL (ref 0.3–1.2)
Total Protein: 8.2 g/dL — ABNORMAL HIGH (ref 6.5–8.1)

## 2020-05-18 LAB — CBC WITH DIFFERENTIAL (CANCER CENTER ONLY)
Abs Immature Granulocytes: 0.01 10*3/uL (ref 0.00–0.07)
Basophils Absolute: 0 10*3/uL (ref 0.0–0.1)
Basophils Relative: 0 %
Eosinophils Absolute: 0 10*3/uL (ref 0.0–0.5)
Eosinophils Relative: 0 %
HCT: 34.3 % — ABNORMAL LOW (ref 39.0–52.0)
Hemoglobin: 11.3 g/dL — ABNORMAL LOW (ref 13.0–17.0)
Immature Granulocytes: 0 %
Lymphocytes Relative: 11 %
Lymphs Abs: 0.4 10*3/uL — ABNORMAL LOW (ref 0.7–4.0)
MCH: 28 pg (ref 26.0–34.0)
MCHC: 32.9 g/dL (ref 30.0–36.0)
MCV: 84.9 fL (ref 80.0–100.0)
Monocytes Absolute: 0.5 10*3/uL (ref 0.1–1.0)
Monocytes Relative: 11 %
Neutro Abs: 3.3 10*3/uL (ref 1.7–7.7)
Neutrophils Relative %: 78 %
Platelet Count: 363 10*3/uL (ref 150–400)
RBC: 4.04 MIL/uL — ABNORMAL LOW (ref 4.22–5.81)
RDW: 15.5 % (ref 11.5–15.5)
WBC Count: 4.2 10*3/uL (ref 4.0–10.5)
nRBC: 0 % (ref 0.0–0.2)

## 2020-05-18 MED ORDER — GABAPENTIN 300 MG PO CAPS
600.0000 mg | ORAL_CAPSULE | Freq: Two times a day (BID) | ORAL | 2 refills | Status: DC
Start: 2020-05-18 — End: 2020-09-27

## 2020-05-18 NOTE — Progress Notes (Signed)
[place] [phone]   [phone]  PROGRESS NOTE  Patient Care Team: [name], MD as PCP - General (Internal Medicine) Cira Rue, RN Nurse Navigator as Registered Nurse (Medical Oncology)  Hematological/Oncological History # Metastatic Castrate Sensitive Prostate Cancer, Metastatic to Bone 1) 07/13/2019: Abdomen/Pelvis CT extensive lytic changes with superimposed pathological fractures. Lytic change noted in the right iliac bone and new lucencies in the right T10 vertebrae.  2) 07/19/2019: biopsy of sacral mass shows metastatic prostatic adenocarcinoma, Gleason 4+4.  3) 07/2019: reportedly received Zometa 4g IV and eligard 22.5. Started on Casodex.  4) 6/10-6/16/2021: received palliative radiation to the sacrum at [place]. Received 2000cGy over 6 days 5) Moved to [place]. Lost to follow up from [facility] in [place], [place]. 6) 11/05/2019: establish care with Dr. Lorenso Courier  7) 11/16/2019: Administered Zometa 4mg  IV and Lupron 22.5 mg 8) 12/08/2019: started abiraterone 1000mg  PO daily  9) 03/09/2020: Administered Zometa 4mg  IV and Lupron 22.5 mg  Interval History:  Dustin Alvarez 65 y.o. male with medical history significant for metastatic prostate cancer who presents for a follow up visit. The patient's last visit was on 04/20/2020. In the interim since the last visit he has had no major changes in health.  On exam today Dustin Alvarez reports he is doing well overall.  He reports that he is walking better and that he would like to try to increase the gabapentin as the "muscles in his legs are quite tight".  His weight has been stable at 138 pounds and his appetite has been good. He is on OxyContin 30 mg twice daily (per his request down from 40mg ).  He is also taking approximately 8 of the 5mg  oxycodones per day.    He otherwise denies any fevers, chills, sweats, nausea, vomiting or diarrhea.  He notes he is tolerating abiraterone  therapy well and has no questions or concerns regarding his care.  A full 10 point ROS is listed below.  MEDICAL HISTORY:  Past Medical History:  Diagnosis Date  . Asthma   . COPD (chronic obstructive pulmonary disease) ([place])   . Hypertension   . Prostate cancer [facility])     SURGICAL HISTORY: Past Surgical History:  Procedure Laterality Date  . WRIST SURGERY Left 2017    SOCIAL HISTORY: Social History   Socioeconomic History  . Marital status: Legally Separated    Spouse name: Not on file  . Number of children: Not on file  . Years of education: Not on file  . Highest education level: Not on file  Occupational History  . Not on file  Tobacco Use  . Smoking status: Current Every Day Smoker    Packs/day: 1.00  . Smokeless tobacco: Never Used  . Tobacco comment: almost 1 pkd. Wants patches   Vaping Use  . Vaping Use: Never used  Substance and Sexual Activity  . Alcohol use: Yes    Comment: 1-2 drinks per week.  . Drug use: Never  . Sexual activity: Not Currently  Other Topics Concern  . Not on file  Social History Narrative  . Not on file   Social Determinants of Health   Financial Resource Strain: Not on file  Food Insecurity: Not on file  Transportation Needs: Not on file  Physical Activity: Not on file  Stress: Not on file  Social Connections: Not on file  Intimate Partner Violence: Not on file    FAMILY HISTORY: Family History  Problem Relation Age of Onset  .  COPD Father   . Diabetes Sister   . Diabetes Brother   . Breast cancer Neg Hx   . Prostate cancer Neg Hx   . Colon cancer Neg Hx   . Pancreatic cancer Neg Hx     ALLERGIES:  is allergic to ace inhibitors.  MEDICATIONS:  Current Outpatient Medications  Medication Sig Dispense Refill  . abiraterone acetate (ZYTIGA) 250 MG tablet TAKE 4 TABLETS BY MOUTH DAILY. TAKE ON AN EMPTY STOMACH 1 HOUR BEFORE OR 2 HOURS AFTER A MEAL 120 tablet 0  . albuterol (VENTOLIN HFA) 108 (90 Base) MCG/ACT inhaler  Inhale 2 puffs into the lungs every 6 (six) hours as needed for wheezing or shortness of breath. 8.5 g 3  . amLODipine (NORVASC) 5 MG tablet Take 1 tablet (5 mg total) by mouth daily. 30 tablet 3  . calcium-vitamin D (OSCAL WITH D) 500-200 MG-UNIT tablet Take 1 tablet by mouth daily with breakfast. 90 tablet 3  . finasteride (PROSCAR) 5 MG tablet Take 1 tablet (5 mg total) by mouth daily. 30 tablet 2  . gabapentin (NEURONTIN) 300 MG capsule Take 2 capsules (600 mg total) by mouth 2 (two) times daily. 120 capsule 2  . hydrochlorothiazide (MICROZIDE) 12.5 MG capsule Take 1 capsule (12.5 mg total) by mouth daily. 30 capsule 1  . nicotine (NICODERM CQ - DOSED IN MG/24 HOURS) 21 mg/24hr patch Place 1 patch (21 mg total) onto the skin daily. 30 patch 2  . nicotine polacrilex (NICORETTE) 2 MG gum Take 1 each (2 mg total) by mouth as needed for smoking cessation (Chew up to 24 pieces in a day). 100 tablet 1  . oxyCODONE (OXY IR/ROXICODONE) 5 MG immediate release tablet Take 1 tablet (5 mg total) by mouth every 4 (four) hours as needed. 180 tablet 0  . oxyCODONE (OXYCONTIN) 40 mg 12 hr tablet TAKE 1 TABLET BY MOUTH EVERY 12 HOURS 60 tablet 0  . pantoprazole (PROTONIX) 40 MG tablet Take 1 tablet (40 mg total) by mouth daily. 30 tablet 3  . predniSONE (DELTASONE) 5 MG tablet Take 1 tablet (5 mg total) by mouth daily with breakfast. (do not take prior to starting Abiraterone) 90 tablet 1  . senna-docusate (SENOKOT-S) 8.6-50 MG tablet TAKE 1 TABLET BY MOUTH 2 TIMES DAILY 60 tablet 1  . sildenafil (VIAGRA) 50 MG tablet Take 1 tablet (50 mg total) by mouth daily as needed for erectile dysfunction. 30 tablet 0  . SYMBICORT 160-4.5 MCG/ACT inhaler Inhale 2 puffs into the lungs 2 (two) times daily. 10.2 g 2  . tamsulosin (FLOMAX) 0.4 MG CAPS capsule Take 1 capsule (0.4 mg total) by mouth in the morning and at bedtime. 60 capsule 0  . traZODone (DESYREL) 50 MG tablet Take 1 tablet (50 mg total) by mouth at bedtime. 30  tablet 0  . TRELEGY ELLIPTA 100-62.5-25 MCG/INH AEPB Inhale 2 puffs into the lungs daily. 180 each 0   No current facility-administered medications for this visit.    REVIEW OF SYSTEMS:   Constitutional: ( - ) fevers, ( - )  chills , ( - ) night sweats Eyes: ( - ) blurriness of vision, ( - ) double vision, ( - ) watery eyes Ears, nose, mouth, throat, and face: ( - ) mucositis, ( - ) sore throat Respiratory: ( - ) cough, ( - ) dyspnea, ( - ) wheezes Cardiovascular: ( - ) palpitation, ( - ) chest discomfort, ( - ) lower extremity swelling Gastrointestinal:  ( - )  nausea, ( - ) heartburn, ( - ) change in bowel habits Skin: ( - ) abnormal skin rashes Lymphatics: ( - ) new lymphadenopathy, ( - ) easy bruising Neurological: ( - ) numbness, ( - ) tingling, ( - ) new weaknesses Behavioral/Psych: ( - ) mood change, ( - ) new changes  All other systems were reviewed with the patient and are negative.  PHYSICAL EXAMINATION:  Vitals:   05/18/20 1516  BP: 117/79  Pulse: 90  Resp: 19  Temp: (!) 97.1 F (36.2 C)  SpO2: 97%   Filed Weights   05/18/20 1516  Weight: 138 lb 9.6 oz (62.9 kg)    GENERAL: well appearing elderly African American male in NAD. SKIN: skin color, texture, turgor are normal, no rashes or significant lesions EYES: conjunctiva are pink and non-injected, sclera clear LUNGS: clear to auscultation and percussion with normal breathing effort HEART: regular rate & rhythm and no murmurs and no lower extremity edema Musculoskeletal: no cyanosis of digits and no clubbing  PSYCH: alert & oriented x 3, fluent speech NEURO: no focal motor/sensory deficits  LABORATORY DATA:  I have reviewed the data as listed CBC Latest Ref Rng & Units 05/18/2020 04/20/2020 04/05/2020  WBC 4.0 - 10.5 K/uL 4.2 3.8(L) 4.0  Hemoglobin 13.0 - 17.0 g/dL 11.3(L) 11.1(L) 11.4(L)  Hematocrit 39.0 - 52.0 % 34.3(L) 33.4(L) 35.2(L)  Platelets 150 - 400 K/uL 363 277 304    CMP Latest Ref Rng & Units  05/18/2020 04/20/2020 04/05/2020  Glucose 70 - 99 mg/dL 104(H) 131(H) 104(H)  BUN 8 - 23 mg/dL 14 9 12   Creatinine 0.61 - 1.24 mg/dL 0.86 0.90 0.79  Sodium 135 - 145 mmol/L 139 137 139  Potassium 3.5 - 5.1 mmol/L 3.9 3.4(L) 3.5  Chloride 98 - 111 mmol/L 102 104 103  CO2 22 - 32 mmol/L 26 28 27   Calcium 8.9 - 10.3 mg/dL 9.3 9.1 8.5(L)  Total Protein 6.5 - 8.1 g/dL 8.2(H) 7.5 7.4  Total Bilirubin 0.3 - 1.2 mg/dL 0.3 0.4 0.4  Alkaline Phos 38 - 126 U/L 101 86 81  AST 15 - 41 U/L 12(L) 11(L) 11(L)  ALT 0 - 44 U/L 8 9 6     RADIOGRAPHIC STUDIES: No results found.  ASSESSMENT & PLAN ARREN LAMINACK 65 y.o. male with medical history significant for metastatic prostate cancer who presents for a follow up visit.  Dustin Alvarez has excessively transitioned off the Casodex and has received his first dosages of Zometa and Lupron.  His PSA continues to trend downward and is testosterone is at castrate level.  Given this we started abiraterone therapy 1000 mg p.o. daily with 5 mg of prednisone.  In the interim he has completed palliative radiation therapy.   The major symptom the patient has been experiencing has been pain.  His spinal sacral lesion has been poorly controlled on oxycodone therapy.  He did previously receive radiation therapy in June 2021, but due to continued excruciating pain we asked for him to be reexamined by radiation oncology to see if there is any further intervention they can offer at this time. He has had further palliative radiation, though this has not relieved his pain. Continue OxyContin to 30 mg twice daily (per his request, issues with constipation at higher doses) and additionally continue his as needed fast acting oxycodone. We will continue gabapentin at 300mg  BID.  Due to the need for monitoring of abiraterone we will plan to have him back in 4 weeks for clinic  visit.  PSA 07/13/2019: PSA 819 08/27/2019: 226 11/05/2019: 44.8 12/08/2019: 27.3 12/27/2019: 21.2  02/24/2020:  12.1 03/22/2020: PSA 8.8  # Metastatic Castrate Sensitive Prostate Cancer, Metastatic to Bone --findings are most consistent with metastatic adenocarcinoma of the prostate.  --testosterone at last visit was <3, PSA down to 8.8 ( from 819 at diagnosis)  --Administered Zometa 4mg  IV and Lupron 22.5 mg on 03/09/2020. Next due June 01, 2020 (3 month intervals).  --continue abiraterone 1000mg  with prednisone 5mg  PO daily. Assure he is compliant with the full dose and steroids.  --patient due for a repeat CT scan and NM bone scan in May 2022. Repeat q 3 months  --RTC in 4 weeks for continued monitoring on Abiraterone therapy (06/15/2020)  #Symptom Management --patient requested a refill of viagra due to issues with ED  --gabapentin 600 mg BID to help with pain.  --continue oxycodone 5-10mg  q4H PRN for pain control. --continue oxycontin 30mg  q12H for basal pain control --established care with Radiation Oncology to help with painful bone lesions. Radiation therapy completed 02/01/2021.  --referral to neurosurgery for consideration of nerve block  No orders of the defined types were placed in this encounter.   All questions were answered. The patient knows to call the clinic with any problems, questions or concerns.  A total of more than 30 minutes were spent on this encounter and over half of that time was spent on counseling and coordination of care as outlined above.   Dustin Peoples, MD Department of Hematology/Oncology [place] at [facility] Phone: [phone] Pager: [phone] Email: [email or web address]  05/23/2020 1:43 PM

## 2020-05-18 NOTE — Progress Notes (Signed)
Referral to [facility] for pain management, Dr. Davy Pique fax'd today to [phone]

## 2020-05-19 ENCOUNTER — Telehealth: Payer: Self-pay | Admitting: Hematology and Oncology

## 2020-05-19 LAB — TESTOSTERONE: Testosterone: <3 ng/dL — ABNORMAL LOW (ref 264–916)

## 2020-05-19 LAB — PROSTATE-SPECIFIC AG, SERUM (LABCORP): Prostate Specific Ag, Serum: 4.1 ng/mL — ABNORMAL HIGH (ref 0.0–4.0)

## 2020-05-19 NOTE — Telephone Encounter (Signed)
Scheduled per los. Called and left msg. Mailed printout  °

## 2020-05-22 ENCOUNTER — Encounter: Payer: Self-pay | Admitting: Student

## 2020-05-22 ENCOUNTER — Ambulatory Visit (INDEPENDENT_AMBULATORY_CARE_PROVIDER_SITE_OTHER): Payer: Medicare Other | Admitting: Student

## 2020-05-22 ENCOUNTER — Other Ambulatory Visit: Payer: Self-pay

## 2020-05-22 DIAGNOSIS — C61 Malignant neoplasm of prostate: Secondary | ICD-10-CM | POA: Diagnosis not present

## 2020-05-22 DIAGNOSIS — G893 Neoplasm related pain (acute) (chronic): Secondary | ICD-10-CM

## 2020-05-22 DIAGNOSIS — K219 Gastro-esophageal reflux disease without esophagitis: Secondary | ICD-10-CM | POA: Diagnosis not present

## 2020-05-22 DIAGNOSIS — C7951 Secondary malignant neoplasm of bone: Secondary | ICD-10-CM

## 2020-05-22 DIAGNOSIS — I10 Essential (primary) hypertension: Secondary | ICD-10-CM

## 2020-05-22 DIAGNOSIS — F17210 Nicotine dependence, cigarettes, uncomplicated: Secondary | ICD-10-CM | POA: Diagnosis not present

## 2020-05-22 DIAGNOSIS — G4700 Insomnia, unspecified: Secondary | ICD-10-CM

## 2020-05-22 DIAGNOSIS — Z720 Tobacco use: Secondary | ICD-10-CM | POA: Insufficient documentation

## 2020-05-22 MED ORDER — NICOTINE 21 MG/24HR TD PT24: 21.0000 mg | MEDICATED_PATCH | TRANSDERMAL | 0 refills | Status: DC

## 2020-05-22 MED ORDER — NICOTINE POLACRILEX 2 MG MT GUM: 2.0000 mg | CHEWING_GUM | OROMUCOSAL | 0 refills | Status: DC | PRN

## 2020-05-22 MED ORDER — FINASTERIDE 5 MG PO TABS: 5.0000 mg | ORAL_TABLET | Freq: Every day | ORAL | 2 refills | Status: DC

## 2020-05-22 MED ORDER — HYDROCHLOROTHIAZIDE 12.5 MG PO CAPS
12.5000 mg | ORAL_CAPSULE | Freq: Every day | ORAL | 1 refills | Status: DC
Start: 2020-05-22 — End: 2020-07-24

## 2020-05-22 MED ORDER — PANTOPRAZOLE SODIUM 40 MG PO TBEC: 40.0000 mg | DELAYED_RELEASE_TABLET | Freq: Every day | ORAL | 3 refills | Status: DC

## 2020-05-22 MED ORDER — NICOTINE POLACRILEX 2 MG MT GUM: 2.0000 mg | CHEWING_GUM | OROMUCOSAL | 1 refills | Status: DC | PRN

## 2020-05-22 MED ORDER — TRAZODONE HCL 50 MG PO TABS: 50.0000 mg | ORAL_TABLET | Freq: Every day | ORAL | 0 refills | Status: DC

## 2020-05-22 MED ORDER — AMLODIPINE BESYLATE 5 MG PO TABS: 5.0000 mg | ORAL_TABLET | Freq: Every day | ORAL | 3 refills | Status: DC

## 2020-05-22 MED ORDER — NICOTINE 21 MG/24HR TD PT24: 21.0000 mg | MEDICATED_PATCH | TRANSDERMAL | 2 refills | Status: AC

## 2020-05-22 NOTE — Assessment & Plan Note (Addendum)
Blood pressure 126/76 in office today. Well controlled on current medications.  - continue amlodipine 5 mg and hydrochlorothiazide 12.5 mg  - 10 year ASCVD score of 27.2% based on lipid panel from prior visit, consider discussing statin therapy at next visit

## 2020-05-22 NOTE — Patient Instructions (Addendum)
It was a pleasure seeing you in clinic. Today we discussed:   Smoking cessation: I have placed prescription for nicotine patches. Please change patches daily. I have also prescribed nicotine gum which you can chew every 1-2 hours. You can gradually cut down on the amount of gum over time.   Medications refills: I have sent refills to [facility] please contact us if anything is missing.  Please follow up in 1-2 months to discuss how well cutting down on cigarettes is going  If you have any questions or concerns, please call our clinic at [phone] between 9am-5pm and after hours call [phone] and ask for the internal medicine resident on call. If you feel you are having a medical emergency please call 911.   Thank you, we look forward to helping you remain healthy!

## 2020-05-22 NOTE — Assessment & Plan Note (Signed)
Reports he has difficulty remaining asleep which he attributes to the TV waking him up at night. States he need a refill on his trazodone which he states helps him fall asleep. Discussed sleep hygiene including consistent sleep schedule, avoiding naps, and reducing screen time prior to bed time. Patient express understanding and will try to implement this.  - refill for trazodone sent .

## 2020-05-22 NOTE — Assessment & Plan Note (Signed)
Protonix 40 mg daily refill sent.

## 2020-05-22 NOTE — Assessment & Plan Note (Addendum)
Patient presents with paperwork for handicap placard. States he is only able to walk 20-30 feet without assistance due sacral pain. Usually ambulates with cane or walker. Imaging with significant metatatic bone disease of the sacrum and thoracic spine from metastatic prostate cancer. States pain medications are filled by Oncology. Hi pain is fairly well controlled and he is walking a bit more. Asked if he can increase his gabapentin from 300 mg to 600 mg. Advisedper Dr. Libby Maw notes his gabapentin was increased to 600 mg twice daily at last visit on 4/7. Filled out paperwork handicap parking placard. He has follow up with oncology next week.  - continue oxycodone 40 mg 12 hr tablet twice daily and oxycodone 5 mg every 4 hours as needed  - continue gabapentin 600 mg twice daily

## 2020-05-22 NOTE — Assessment & Plan Note (Addendum)
Patient is interested in smoking cessation. 30-40 pack year history. States he when he smokes more he feels short of breath and would like to cut down. He is interested in trying nicotine patches which he has tried before. States he was able to quit for about a week many years ago but started to smoke due to cravings. CT Chest abdomen and pelvis from 2 months ago with emphysema but no suspicious pulmonary nodules. Congratulated patient deciding to pursue smoking cessation. Advised that having nicotine gum as needed in addition to patch may also help with cravings, which he expressed interest in trying.  - nicotine patch 21 mg daily  - nicotine gum 2 mg as needed  - follow up in 4-6 weeks

## 2020-05-22 NOTE — Progress Notes (Signed)
   CC: smoking cessation  HPI:  [name] is a 65 y.o. male with history listed below presents for interest in smoking cessation and medication refill. Please refer to problem based charting for further details and assessment and plan of current problem and chronic medical conditions.  Past Medical History:  Diagnosis Date  . Asthma   . COPD (chronic obstructive pulmonary disease) ([place])   . Hypertension   . Prostate cancer [facility])    Review of Systems:  Negative as per HPI  Physical Exam:  Vitals:   05/22/20 1001  BP: 126/76  Pulse: 94  Temp: 98.5 F (36.9 C)  TempSrc: Oral  SpO2: 98%  Weight: 140 lb (63.5 kg)  Height: 6' (1.829 m)   Physical Exam Constitutional:      General: He is not in acute distress.    Appearance: Normal appearance.  HENT:     Head: Atraumatic.     Mouth/Throat:     Mouth: Mucous membranes are moist.     Pharynx: Oropharynx is clear.  Eyes:     Extraocular Movements: Extraocular movements intact.     Conjunctiva/sclera: Conjunctivae normal.     Pupils: Pupils are equal, round, and reactive to light.  Cardiovascular:     Rate and Rhythm: Normal rate and regular rhythm.  Pulmonary:     Effort: Pulmonary effort is normal. No respiratory distress.     Breath sounds: No wheezing, rhonchi or rales.     Comments: Breath sounds quiet Abdominal:     General: Abdomen is flat. Bowel sounds are normal.     Palpations: Abdomen is soft.  Musculoskeletal:     Right lower leg: No edema.     Left lower leg: No edema.  Skin:    General: Skin is warm and dry.     Capillary Refill: Capillary refill takes less than 2 seconds.  Neurological:     General: No focal deficit present.     Mental Status: He is alert and oriented to person, place, and time.  Psychiatric:        Mood and Affect: Mood normal.        Behavior: Behavior normal.     Assessment & Plan:   See Encounters Tab for problem based charting.  Patient discussed with Dr.  Dareen Piano

## 2020-05-23 ENCOUNTER — Other Ambulatory Visit (HOSPITAL_COMMUNITY): Payer: Self-pay

## 2020-05-23 NOTE — Progress Notes (Signed)
Internal Medicine Clinic Attending ? ?Case discussed with Dr. Liang  At the time of the visit.  We reviewed the resident?s history and exam and pertinent patient test results.  I agree with the assessment, diagnosis, and plan of care documented in the resident?s note. ? ?

## 2020-05-26 ENCOUNTER — Other Ambulatory Visit (HOSPITAL_COMMUNITY): Payer: Self-pay

## 2020-05-30 ENCOUNTER — Other Ambulatory Visit: Payer: Self-pay | Admitting: Hematology and Oncology

## 2020-05-30 ENCOUNTER — Other Ambulatory Visit (HOSPITAL_COMMUNITY): Payer: Self-pay

## 2020-05-31 ENCOUNTER — Other Ambulatory Visit (HOSPITAL_COMMUNITY): Payer: Self-pay

## 2020-05-31 MED ORDER — OXYCODONE HCL 5 MG PO TABS
ORAL_TABLET | ORAL | 0 refills | Status: DC | PRN
  Filled 2020-05-31 – 2020-06-02 (×4): qty 180, 30d supply, fill #0
  Filled 2020-06-02: qty 180, fill #0

## 2020-05-31 MED ORDER — ABIRATERONE ACETATE 250 MG PO TABS
ORAL_TABLET | ORAL | 2 refills | Status: DC
  Filled 2020-05-31: qty 120, 30d supply, fill #0
  Filled 2020-06-30: qty 120, 30d supply, fill #1
  Filled 2020-07-31: qty 120, 30d supply, fill #2

## 2020-06-01 ENCOUNTER — Other Ambulatory Visit: Payer: Self-pay

## 2020-06-01 ENCOUNTER — Inpatient Hospital Stay: Payer: Medicare Other

## 2020-06-01 ENCOUNTER — Other Ambulatory Visit: Payer: Self-pay | Admitting: *Deleted

## 2020-06-01 VITALS — BP 100/70 | HR 91 | Temp 98.2°F | Resp 16 | Wt 138.0 lb

## 2020-06-01 DIAGNOSIS — C61 Malignant neoplasm of prostate: Secondary | ICD-10-CM

## 2020-06-01 DIAGNOSIS — C7951 Secondary malignant neoplasm of bone: Secondary | ICD-10-CM

## 2020-06-01 LAB — CMP (CANCER CENTER ONLY)
ALT: <6 U/L (ref 0–44)
AST: 10 U/L — ABNORMAL LOW (ref 15–41)
Albumin: 3.6 g/dL (ref 3.5–5.0)
Alkaline Phosphatase: 81 U/L (ref 38–126)
Anion gap: 11 (ref 5–15)
BUN: 12 mg/dL (ref 8–23)
CO2: 27 mmol/L (ref 22–32)
Calcium: 8.7 mg/dL — ABNORMAL LOW (ref 8.9–10.3)
Chloride: 101 mmol/L (ref 98–111)
Creatinine: 0.83 mg/dL (ref 0.61–1.24)
GFR, Estimated: >60 mL/min (ref >60)
Glucose, Bld: 170 mg/dL — ABNORMAL HIGH (ref 70–99)
Potassium: 3.5 mmol/L (ref 3.5–5.1)
Sodium: 139 mmol/L (ref 135–145)
Total Bilirubin: 0.2 mg/dL — ABNORMAL LOW (ref 0.3–1.2)
Total Protein: 7.1 g/dL (ref 6.5–8.1)

## 2020-06-01 LAB — CBC WITH DIFFERENTIAL (CANCER CENTER ONLY)
Abs Immature Granulocytes: 0.01 10*3/uL (ref 0.00–0.07)
Basophils Absolute: 0 10*3/uL (ref 0.0–0.1)
Basophils Relative: 0 %
Eosinophils Absolute: 0.1 10*3/uL (ref 0.0–0.5)
Eosinophils Relative: 2 %
HCT: 33.9 % — ABNORMAL LOW (ref 39.0–52.0)
Hemoglobin: 11.2 g/dL — ABNORMAL LOW (ref 13.0–17.0)
Immature Granulocytes: 0 %
Lymphocytes Relative: 10 %
Lymphs Abs: 0.5 10*3/uL — ABNORMAL LOW (ref 0.7–4.0)
MCH: 27.8 pg (ref 26.0–34.0)
MCHC: 33 g/dL (ref 30.0–36.0)
MCV: 84.1 fL (ref 80.0–100.0)
Monocytes Absolute: 0.5 10*3/uL (ref 0.1–1.0)
Monocytes Relative: 11 %
Neutro Abs: 3.8 10*3/uL (ref 1.7–7.7)
Neutrophils Relative %: 77 %
Platelet Count: 316 10*3/uL (ref 150–400)
RBC: 4.03 MIL/uL — ABNORMAL LOW (ref 4.22–5.81)
RDW: 14.8 % (ref 11.5–15.5)
WBC Count: 4.9 10*3/uL (ref 4.0–10.5)
nRBC: 0 % (ref 0.0–0.2)

## 2020-06-01 MED ORDER — LEUPROLIDE ACETATE (3 MONTH) 22.5 MG ~~LOC~~ KIT
PACK | SUBCUTANEOUS | Status: AC
  Filled 2020-06-01: qty 22.5

## 2020-06-01 MED ORDER — ZOLEDRONIC ACID 4 MG/100ML IV SOLN
INTRAVENOUS | Status: AC
  Filled 2020-06-01: qty 100

## 2020-06-01 MED ORDER — SODIUM CHLORIDE 0.9 % IV SOLN
Freq: Once | INTRAVENOUS | Status: AC
  Filled 2020-06-01: qty 250

## 2020-06-01 MED ORDER — LEUPROLIDE ACETATE (3 MONTH) 22.5 MG ~~LOC~~ KIT
22.5000 mg | PACK | Freq: Once | SUBCUTANEOUS | Status: AC
  Administered 2020-06-01: 22.5 mg via SUBCUTANEOUS

## 2020-06-01 MED ORDER — ZOLEDRONIC ACID 4 MG/100ML IV SOLN
4.0000 mg | Freq: Once | INTRAVENOUS | Status: AC
  Administered 2020-06-01: 4 mg via INTRAVENOUS

## 2020-06-01 NOTE — Patient Instructions (Signed)
Zoledronic Acid Injection (Hypercalcemia, Oncology) What is this medicine? ZOLEDRONIC ACID (ZOE le dron ik AS id) slows calcium loss from bones. It high calcium levels in the blood from some kinds of cancer. It may be used in other people at risk for bone loss. This medicine may be used for other purposes; ask your health care provider or pharmacist if you have questions. COMMON BRAND NAME(S): Zometa What should I tell my health care provider before I take this medicine? They need to know if you have any of these conditions:  cancer  dehydration  dental disease  kidney disease  liver disease  low levels of calcium in the blood  lung or breathing disease (asthma)  receiving steroids like dexamethasone or prednisone  an unusual or allergic reaction to zoledronic acid, other medicines, foods, dyes, or preservatives  pregnant or trying to get pregnant  breast-feeding How should I use this medicine? This drug is injected into a vein. It is given by a health care provider in a hospital or clinic setting. Talk to your health care provider about the use of this drug in children. Special care may be needed. Overdosage: If you think you have taken too much of this medicine contact a poison control center or emergency room at once. NOTE: This medicine is only for you. Do not share this medicine with others. What if I miss a dose? Keep appointments for follow-up doses. It is important not to miss your dose. Call your health care provider if you are unable to keep an appointment. What may interact with this medicine?  certain antibiotics given by injection  NSAIDs, medicines for pain and inflammation, like ibuprofen or naproxen  some diuretics like bumetanide, furosemide  teriparatide  thalidomide This list may not describe all possible interactions. Give your health care provider a list of all the medicines, herbs, non-prescription drugs, or dietary supplements you use. Also tell  them if you smoke, drink alcohol, or use illegal drugs. Some items may interact with your medicine. What should I watch for while using this medicine? Visit your health care provider for regular checks on your progress. It may be some time before you see the benefit from this drug. Some people who take this drug have severe bone, joint, or muscle pain. This drug may also increase your risk for jaw problems or a broken thigh bone. Tell your health care provider right away if you have severe pain in your jaw, bones, joints, or muscles. Tell you health care provider if you have any pain that does not go away or that gets worse. Tell your dentist and dental surgeon that you are taking this drug. You should not have major dental surgery while on this drug. See your dentist to have a dental exam and fix any dental problems before starting this drug. Take good care of your teeth while on this drug. Make sure you see your dentist for regular follow-up appointments. You should make sure you get enough calcium and vitamin D while you are taking this drug. Discuss the foods you eat and the vitamins you take with your health care provider. Check with your health care provider if you have severe diarrhea, nausea, and vomiting, or if you sweat a lot. The loss of too much body fluid may make it dangerous for you to take this drug. You may need blood work done while you are taking this drug. Do not become pregnant while taking this drug. Women should inform their health care provider   if they wish to become pregnant or think they might be pregnant. There is potential for serious harm to an unborn child. Talk to your health care provider for more information. What side effects may I notice from receiving this medicine? Side effects that you should report to your doctor or health care provider as soon as possible:  allergic reactions (skin rash, itching or hives; swelling of the face, lips, or tongue)  bone  pain  infection (fever, chills, cough, sore throat, pain or trouble passing urine)  jaw pain, especially after dental work  joint pain  kidney injury (trouble passing urine or change in the amount of urine)  low blood pressure (dizziness; feeling faint or lightheaded, falls; unusually weak or tired)  low calcium levels (fast heartbeat; muscle cramps or pain; pain, tingling, or numbness in the hands or feet; seizures)  low magnesium levels (fast, irregular heartbeat; muscle cramp or pain; muscle weakness; tremors; seizures)  low red blood cell counts (trouble breathing; feeling faint; lightheaded, falls; unusually weak or tired)  muscle pain  redness, blistering, peeling, or loosening of the skin, including inside the mouth  severe diarrhea  swelling of the ankles, feet, hands  trouble breathing Side effects that usually do not require medical attention (report to your doctor or health care provider if they continue or are bothersome):  anxious  constipation  coughing  depressed mood  eye irritation, itching, or pain  fever  general ill feeling or flu-like symptoms  nausea  pain, redness, or irritation at site where injected  trouble sleeping This list may not describe all possible side effects. Call your doctor for medical advice about side effects. You may report side effects to FDA at 1-800-FDA-1088. Where should I keep my medicine? This drug is given in a hospital or clinic. It will not be stored at home. NOTE: This sheet is a summary. It may not cover all possible information. If you have questions about this medicine, talk to your doctor, pharmacist, or health care provider.  2021 Elsevier/Gold Standard (2018-11-12 09:13:00)  Leuprolide injection What is this medicine? LEUPROLIDE (loo PROE lide) is a man-made hormone. It is used to treat the symptoms of prostate cancer. This medicine may also be used to treat children with early onset of puberty. It may  be used for other hormonal conditions. This medicine may be used for other purposes; ask your health care provider or pharmacist if you have questions. COMMON BRAND NAME(S): Lupron What should I tell my health care provider before I take this medicine? They need to know if you have any of these conditions:  diabetes  heart disease or previous heart attack  high blood pressure  high cholesterol  pain or difficulty passing urine  spinal cord metastasis  stroke  tobacco smoker  an unusual or allergic reaction to leuprolide, benzyl alcohol, other medicines, foods, dyes, or preservatives  pregnant or trying to get pregnant  breast-feeding How should I use this medicine? This medicine is for injection under the skin or into a muscle. You will be taught how to prepare and give this medicine. Use exactly as directed. Take your medicine at regular intervals. Do not take your medicine more often than directed. It is important that you put your used needles and syringes in a special sharps container. Do not put them in a trash can. If you do not have a sharps container, call your pharmacist or healthcare provider to get one. A special MedGuide will be given to you by  the pharmacist with each prescription and refill. Be sure to read this information carefully each time. Talk to your pediatrician regarding the use of this medicine in children. While this medicine may be prescribed for children as young as 8 years for selected conditions, precautions do apply. Overdosage: If you think you have taken too much of this medicine contact a poison control center or emergency room at once. NOTE: This medicine is only for you. Do not share this medicine with others. What if I miss a dose? If you miss a dose, take it as soon as you can. If it is almost time for your next dose, take only that dose. Do not take double or extra doses. What may interact with this medicine? Do not take this medicine with  any of the following medications:  chasteberry  cisapride  dronedarone  pimozide  thioridazine This medicine may also interact with the following medications:  herbal or dietary supplements, like black cohosh or DHEA  male hormones, like estrogens or progestins and birth control pills, patches, rings, or injections  male hormones, like testosterone  other medicines that prolong the QT interval (abnormal heart rhythm) This list may not describe all possible interactions. Give your health care provider a list of all the medicines, herbs, non-prescription drugs, or dietary supplements you use. Also tell them if you smoke, drink alcohol, or use illegal drugs. Some items may interact with your medicine. What should I watch for while using this medicine? Visit your doctor or health care professional for regular checks on your progress. During the first week, your symptoms may get worse, but then will improve as you continue your treatment. You may get hot flashes, increased bone pain, increased difficulty passing urine, or an aggravation of nerve symptoms. Discuss these effects with your doctor or health care professional, some of them may improve with continued use of this medicine. Male patients may experience a menstrual cycle or spotting during the first 2 months of therapy with this medicine. If this continues, contact your doctor or health care professional. This medicine may increase blood sugar. Ask your healthcare provider if changes in diet or medicines are needed if you have diabetes. What side effects may I notice from receiving this medicine? Side effects that you should report to your doctor or health care professional as soon as possible:  allergic reactions like skin rash, itching or hives, swelling of the face, lips, or tongue  breathing problems  chest pain  depression or memory disorders  pain in your legs or groin  pain at site where injected  severe  headache  signs and symptoms of high blood sugar such as being more thirsty or hungry or having to urinate more than normal. You may also feel very tired or have blurry vision  swelling of the feet and legs  visual changes  vomiting Side effects that usually do not require medical attention (report to your doctor or health care professional if they continue or are bothersome):  breast swelling or tenderness  decrease in sex drive or performance  diarrhea  hot flashes  loss of appetite  muscle, joint, or bone pains  nausea  redness or irritation at site where injected  skin problems or acne This list may not describe all possible side effects. Call your doctor for medical advice about side effects. You may report side effects to FDA at 1-800-FDA-1088. Where should I keep my medicine? Keep out of the reach of children. Store below 25 degrees C (  77 degrees F). Do not freeze. Protect from light. Do not use if it is not clear or if there are particles present. Throw away any unused medicine after the expiration date. NOTE: This sheet is a summary. It may not cover all possible information. If you have questions about this medicine, talk to your doctor, pharmacist, or health care provider.  2021 Elsevier/Gold Standard (2018-12-30 10:57:41)

## 2020-06-01 NOTE — Progress Notes (Signed)
Corrected calcium 9.2  Zometa given per orders.

## 2020-06-02 ENCOUNTER — Other Ambulatory Visit (HOSPITAL_COMMUNITY): Payer: Self-pay

## 2020-06-06 ENCOUNTER — Other Ambulatory Visit (HOSPITAL_COMMUNITY): Payer: Self-pay

## 2020-06-09 ENCOUNTER — Other Ambulatory Visit (HOSPITAL_COMMUNITY): Payer: Self-pay

## 2020-06-09 ENCOUNTER — Other Ambulatory Visit: Payer: Self-pay | Admitting: Hematology and Oncology

## 2020-06-09 ENCOUNTER — Telehealth: Payer: Self-pay | Admitting: *Deleted

## 2020-06-09 MED ORDER — OXYCODONE HCL ER 40 MG PO T12A
40.0000 mg | EXTENDED_RELEASE_TABLET | Freq: Two times a day (BID) | ORAL | 0 refills | Status: DC
  Filled 2020-06-09: qty 60, 30d supply, fill #0

## 2020-06-09 MED ORDER — OXYCODONE HCL ER 40 MG PO T12A: EXTENDED_RELEASE_TABLET | Freq: Two times a day (BID) | ORAL | 0 refills | Status: DC

## 2020-06-09 NOTE — Telephone Encounter (Signed)
TCT patient's daughter, [name] to advise that her father's prescription has been sent in. She voiced understanding and will pick them up.

## 2020-06-09 NOTE — Telephone Encounter (Signed)
Received vm message from patient's daughter requesting refill of pt's oxycontin 40 mg.  Last filled on 05/09/20 for # 21  At [facility]

## 2020-06-09 NOTE — Addendum Note (Signed)
Addended by: Dede Query T on: 06/09/2020 04:00 PM   Modules accepted: Orders

## 2020-06-09 NOTE — Telephone Encounter (Signed)
Refill has been sent.  °

## 2020-06-12 ENCOUNTER — Other Ambulatory Visit (HOSPITAL_COMMUNITY): Payer: Self-pay

## 2020-06-15 ENCOUNTER — Telehealth: Payer: Self-pay | Admitting: *Deleted

## 2020-06-15 ENCOUNTER — Inpatient Hospital Stay: Payer: Medicare (Managed Care)

## 2020-06-15 ENCOUNTER — Other Ambulatory Visit: Payer: Self-pay | Admitting: Hematology and Oncology

## 2020-06-15 ENCOUNTER — Inpatient Hospital Stay: Payer: Medicare (Managed Care) | Admitting: Hematology and Oncology

## 2020-06-15 DIAGNOSIS — C61 Malignant neoplasm of prostate: Secondary | ICD-10-CM

## 2020-06-15 DIAGNOSIS — C7951 Secondary malignant neoplasm of bone: Secondary | ICD-10-CM

## 2020-06-15 NOTE — Telephone Encounter (Signed)
TCT patient's family member, Artist. As pt did not show for his appts today.  It is very unusual for pt not to show. Spoke with her and she states they did not know he had an appt for today.  She is requesting that she be called for his future appts. She states Mace is doing ok.  Advised that I will have scheduling call her for his appts  Scheduling message sent

## 2020-06-16 ENCOUNTER — Telehealth: Payer: Self-pay | Admitting: Hematology and Oncology

## 2020-06-16 NOTE — Telephone Encounter (Signed)
R/s appts per 5/5 sch msg. Dustin Alvarez is aware.

## 2020-06-18 ENCOUNTER — Encounter: Payer: Self-pay | Admitting: *Deleted

## 2020-06-18 NOTE — Progress Notes (Unsigned)

## 2020-06-19 NOTE — Progress Notes (Unsigned)
Things That May Be Affecting Your Health:  Alcohol  Hearing loss x Pain    Depression  Home Safety  Sexual Health   Diabetes  Lack of physical activity  Stress   Difficulty with daily activities  Loneliness  Tiredness   Drug use x Medicines x Tobacco use   Falls  Motor Vehicle Safety  Weight   Food choices  Oral Health  Other    YOUR PERSONALIZED HEALTH PLAN : 1. Schedule your next subsequent Medicare Wellness visit in one year 2. Attend all of your regular appointments to address your medical issues 3. Complete the preventative screenings and services   Annual Wellness Visit   Medicare Covered Preventative Screenings and [place] Men and Women Who How Often Need? Date of Last Service Action  Abdominal Aortic Aneurysm Adults with AAA risk factors Once x     Alcohol Misuse and Counseling All Adults Screening once a year if no alcohol misuse. Counseling up to 4 face to face sessions.     Bone Density Measurement  Adults at risk for osteoporosis Once every 2 yrs      Lipid Panel Z13.6 All adults without CV disease Once every 5 yrs   11/01/19    Colorectal Cancer   Stool sample or  Colonoscopy All adults 70 and older   Once every year  Every 10 years        Depression All Adults Once a year  Today   Diabetes Screening Blood glucose, post glucose load, or GTT Z13.1  All adults at risk  Pre-diabetics  Once per year  Twice per year      Diabetes  Self-Management Training All adults Diabetics 10 hrs first year; 2 hours subsequent years. Requires Copay     Glaucoma  Diabetics  Family history of glaucoma  African Americans 67 yrs +  Hispanic Americans 67 yrs + Annually - requires coppay      Hepatitis C Z72.89 or F19.20  High Risk for HCV  Born between 1945 and 1965  Annually  Once      HIV Z11.4 All adults based on risk  Annually btw ages 49 & 5 regardless of risk  Annually > 65 yrs if at increased risk      Lung Cancer  Screening Asymptomatic adults aged 57-77 with 30 pack yr history and current smoker OR quit within the last 15 yrs Annually Must have counseling and shared decision making documentation before first screen x     Medical Nutrition Therapy Adults with   Diabetes  Renal disease  Kidney transplant within past 3 yrs 3 hours first year; 2 hours subsequent years     Obesity and Counseling All adults Screening once a year Counseling if BMI 30 or higher  Today   Tobacco Use Counseling Adults who use tobacco  Up to 8 visits in one year x    Vaccines Z23  Hepatitis B  Influenza   Pneumonia  Adults   Once  Once every flu season  Two different vaccines separated by one year     Next Annual Wellness Visit People with Medicare Every year x Today     Services & Screenings Women Who How Often Need  Date of Last Service Action  Mammogram  Z12.31 Women over 54 One baseline ages 52-39. Annually ager 40 yrs+      Pap tests All women Annually if high risk. Every 2 yrs for normal risk women  Screening for cervical cancer with   Pap (Z01.419 nl or Z01.411abnl) &  HPV Z11.51 Women aged 15 to 28 Once every 5 yrs     Screening pelvic and breast exams All women Annually if high risk. Every 2 yrs for normal risk women     Sexually Transmitted Diseases  Chlamydia  Gonorrhea  Syphilis All at risk adults Annually for non pregnant females at increased risk         [place] Men Who How Ofter Need  Date of Last Service Action  Prostate Cancer - DRE & PSA Men over 50 Annually.  DRE might require a copay.        Sexually Transmitted Diseases  Syphilis All at risk adults Annually for men at increased risk      Health Maintenance List Health Maintenance  Topic Date Due  . COVID-19 Vaccine (1) Never done  . Hepatitis C Screening  Never done  . COLONOSCOPY (Pts 45-60yrs Insurance coverage will need to be confirmed)  Never done  . INFLUENZA VACCINE  09/11/2020  . PNA  vac Low Risk Adult (2 of 2 - PPSV23) 10/31/2020  . TETANUS/TDAP  10/31/2029  . HPV VACCINES  Aged Out

## 2020-06-21 ENCOUNTER — Other Ambulatory Visit (HOSPITAL_COMMUNITY): Payer: Self-pay

## 2020-06-22 ENCOUNTER — Other Ambulatory Visit (HOSPITAL_COMMUNITY): Payer: Self-pay

## 2020-06-23 ENCOUNTER — Other Ambulatory Visit (HOSPITAL_COMMUNITY): Payer: Self-pay

## 2020-06-26 ENCOUNTER — Other Ambulatory Visit (HOSPITAL_COMMUNITY): Payer: Self-pay

## 2020-06-28 ENCOUNTER — Other Ambulatory Visit (HOSPITAL_COMMUNITY): Payer: Self-pay

## 2020-06-29 ENCOUNTER — Other Ambulatory Visit: Payer: Self-pay | Admitting: Internal Medicine

## 2020-06-30 ENCOUNTER — Other Ambulatory Visit (HOSPITAL_COMMUNITY): Payer: Self-pay

## 2020-06-30 ENCOUNTER — Other Ambulatory Visit: Payer: Self-pay | Admitting: Hematology and Oncology

## 2020-07-03 ENCOUNTER — Inpatient Hospital Stay (HOSPITAL_BASED_OUTPATIENT_CLINIC_OR_DEPARTMENT_OTHER): Payer: Medicare (Managed Care) | Admitting: Hematology and Oncology

## 2020-07-03 ENCOUNTER — Inpatient Hospital Stay: Payer: Medicare (Managed Care) | Attending: Hematology and Oncology

## 2020-07-03 ENCOUNTER — Other Ambulatory Visit: Payer: Self-pay | Admitting: Hematology and Oncology

## 2020-07-03 ENCOUNTER — Other Ambulatory Visit (HOSPITAL_COMMUNITY): Payer: Self-pay

## 2020-07-03 ENCOUNTER — Other Ambulatory Visit: Payer: Self-pay

## 2020-07-03 ENCOUNTER — Encounter: Payer: Self-pay | Admitting: Hematology and Oncology

## 2020-07-03 VITALS — BP 110/66 | HR 95 | Temp 97.0°F | Resp 18 | Ht 72.0 in | Wt 138.0 lb

## 2020-07-03 DIAGNOSIS — Z923 Personal history of irradiation: Secondary | ICD-10-CM | POA: Diagnosis not present

## 2020-07-03 DIAGNOSIS — Z803 Family history of malignant neoplasm of breast: Secondary | ICD-10-CM | POA: Diagnosis not present

## 2020-07-03 DIAGNOSIS — Z7952 Long term (current) use of systemic steroids: Secondary | ICD-10-CM | POA: Diagnosis not present

## 2020-07-03 DIAGNOSIS — Z79899 Other long term (current) drug therapy: Secondary | ICD-10-CM | POA: Diagnosis not present

## 2020-07-03 DIAGNOSIS — C7951 Secondary malignant neoplasm of bone: Secondary | ICD-10-CM | POA: Diagnosis present

## 2020-07-03 DIAGNOSIS — N529 Male erectile dysfunction, unspecified: Secondary | ICD-10-CM | POA: Diagnosis not present

## 2020-07-03 DIAGNOSIS — N522 Drug-induced erectile dysfunction: Secondary | ICD-10-CM

## 2020-07-03 DIAGNOSIS — I10 Essential (primary) hypertension: Secondary | ICD-10-CM | POA: Diagnosis not present

## 2020-07-03 DIAGNOSIS — Z833 Family history of diabetes mellitus: Secondary | ICD-10-CM | POA: Insufficient documentation

## 2020-07-03 DIAGNOSIS — Z836 Family history of other diseases of the respiratory system: Secondary | ICD-10-CM | POA: Insufficient documentation

## 2020-07-03 DIAGNOSIS — C61 Malignant neoplasm of prostate: Secondary | ICD-10-CM | POA: Diagnosis present

## 2020-07-03 DIAGNOSIS — F17210 Nicotine dependence, cigarettes, uncomplicated: Secondary | ICD-10-CM | POA: Insufficient documentation

## 2020-07-03 LAB — CBC WITH DIFFERENTIAL (CANCER CENTER ONLY)
Abs Immature Granulocytes: 0.01 10*3/uL (ref 0.00–0.07)
Basophils Absolute: 0 10*3/uL (ref 0.0–0.1)
Basophils Relative: 0 %
Eosinophils Absolute: 0 10*3/uL (ref 0.0–0.5)
Eosinophils Relative: 1 %
HCT: 32.4 % — ABNORMAL LOW (ref 39.0–52.0)
Hemoglobin: 10.8 g/dL — ABNORMAL LOW (ref 13.0–17.0)
Immature Granulocytes: 0 %
Lymphocytes Relative: 13 %
Lymphs Abs: 0.6 10*3/uL — ABNORMAL LOW (ref 0.7–4.0)
MCH: 27.3 pg (ref 26.0–34.0)
MCHC: 33.3 g/dL (ref 30.0–36.0)
MCV: 81.8 fL (ref 80.0–100.0)
Monocytes Absolute: 0.4 10*3/uL (ref 0.1–1.0)
Monocytes Relative: 9 %
Neutro Abs: 3.4 10*3/uL (ref 1.7–7.7)
Neutrophils Relative %: 77 %
Platelet Count: 321 10*3/uL (ref 150–400)
RBC: 3.96 MIL/uL — ABNORMAL LOW (ref 4.22–5.81)
RDW: 14.2 % (ref 11.5–15.5)
WBC Count: 4.4 10*3/uL (ref 4.0–10.5)
nRBC: 0 % (ref 0.0–0.2)

## 2020-07-03 LAB — CMP (CANCER CENTER ONLY)
ALT: <6 U/L (ref 0–44)
AST: 10 U/L — ABNORMAL LOW (ref 15–41)
Albumin: 3.6 g/dL (ref 3.5–5.0)
Alkaline Phosphatase: 76 U/L (ref 38–126)
Anion gap: 7 (ref 5–15)
BUN: 10 mg/dL (ref 8–23)
CO2: 31 mmol/L (ref 22–32)
Calcium: 9.6 mg/dL (ref 8.9–10.3)
Chloride: 102 mmol/L (ref 98–111)
Creatinine: 0.8 mg/dL (ref 0.61–1.24)
GFR, Estimated: >60 mL/min (ref >60)
Glucose, Bld: 110 mg/dL — ABNORMAL HIGH (ref 70–99)
Potassium: 4.1 mmol/L (ref 3.5–5.1)
Sodium: 140 mmol/L (ref 135–145)
Total Bilirubin: 0.3 mg/dL (ref 0.3–1.2)
Total Protein: 7.1 g/dL (ref 6.5–8.1)

## 2020-07-03 MED ORDER — OXYCODONE HCL 5 MG PO TABS: ORAL_TABLET | ORAL | 0 refills | Status: DC | PRN

## 2020-07-03 NOTE — Progress Notes (Signed)
[place] [phone]   [phone]  PROGRESS NOTE  Patient Care Team: [name], MD as PCP - General (Internal Medicine) Cira Rue, RN Nurse Navigator as Registered Nurse (Medical Oncology)  Hematological/Oncological History # Metastatic Castrate Sensitive Prostate Cancer, Metastatic to Bone 1) 07/13/2019: Abdomen/Pelvis CT extensive lytic changes with superimposed pathological fractures. Lytic change noted in the right iliac bone and new lucencies in the right T10 vertebrae.  2) 07/19/2019: biopsy of sacral mass shows metastatic prostatic adenocarcinoma, Gleason 4+4.  3) 07/2019: reportedly received Zometa 4g IV and eligard 22.5. Started on Casodex.  4) 6/10-6/16/2021: received palliative radiation to the sacrum at [place]. Received 2000cGy over 6 days 5) Moved to [place]. Lost to follow up from [facility] in [place], [place]. 6) 11/05/2019: establish care with Dr. Lorenso Courier  7) 11/16/2019: Administered Zometa 4mg  IV and Lupron 22.5 mg 8) 12/08/2019: started abiraterone 1000mg  PO daily  9) 03/09/2020: Administered Zometa 4mg  IV and Lupron 22.5 mg 10) 06/01/2020: Administered Zometa 4mg  IV and Lupron 22.5 mg  Interval History:  Dustin Alvarez 65 y.o. male with medical history significant for metastatic prostate cancer who presents for a follow up visit. The patient's last visit was on 05/18/2020. In the interim since the last visit he has had no major changes in health.  On exam today Dustin Alvarez reports that everything has been stable since his last visit.  He is looking forward to his neurosurgery appointment on 07/14/2020.  He notes that he continues to have pain and swelling in his feet.  He notes that he is in need of a refill of his oxycodone today.  He notes it is particularly painful when it starts to rain.  He says when the weather gets bad he "really has a problem".  He also reports that the Viagra and Cialis have not been helping  with his erectile dysfunction and he is hoping for something "stronger".  He notes that his energy is good and his appetite has been strong.  His bowels are moving well but he takes a medication to help keep him regular.   He otherwise denies any fevers, chills, sweats, nausea, vomiting or diarrhea.  He notes he is tolerating abiraterone therapy well and has no questions or concerns regarding his care.  A full 10 point ROS is listed below.  MEDICAL HISTORY:  Past Medical History:  Diagnosis Date  . Asthma   . COPD (chronic obstructive pulmonary disease) ([place])   . Hypertension   . Prostate cancer [facility])     SURGICAL HISTORY: Past Surgical History:  Procedure Laterality Date  . WRIST SURGERY Left 2017    SOCIAL HISTORY: Social History   Socioeconomic History  . Marital status: Legally Separated    Spouse name: Not on file  . Number of children: Not on file  . Years of education: Not on file  . Highest education level: Not on file  Occupational History  . Not on file  Tobacco Use  . Smoking status: Current Every Day Smoker    Packs/day: 1.00  . Smokeless tobacco: Never Used  . Tobacco comment: almost 1 pkd. Wants patches   Vaping Use  . Vaping Use: Never used  Substance and Sexual Activity  . Alcohol use: Yes    Comment: 1-2 drinks per week.  . Drug use: Never  . Sexual activity: Not Currently  Other Topics Concern  . Not on file  Social History Narrative  . Not on file  Social Determinants of Health   Financial Resource Strain: Not on file  Food Insecurity: Not on file  Transportation Needs: Not on file  Physical Activity: Not on file  Stress: Not on file  Social Connections: Not on file  Intimate Partner Violence: Not on file    FAMILY HISTORY: Family History  Problem Relation Age of Onset  . COPD Father   . Diabetes Sister   . Diabetes Brother   . Breast cancer Neg Hx   . Prostate cancer Neg Hx   . Colon cancer Neg Hx   . Pancreatic cancer Neg Hx      ALLERGIES:  is allergic to ace inhibitors and lisinopril.  MEDICATIONS:  Current Outpatient Medications  Medication Sig Dispense Refill  . abiraterone acetate (ZYTIGA) 250 MG tablet TAKE 4 TABLETS BY MOUTH DAILY. TAKE ON AN EMPTY STOMACH 1 HOUR BEFORE OR 2 HOURS AFTER A MEAL 120 tablet 2  . albuterol (VENTOLIN HFA) 108 (90 Base) MCG/ACT inhaler Inhale 2 puffs into the lungs every 6 (six) hours as needed for wheezing or shortness of breath. 8.5 g 3  . amLODipine (NORVASC) 5 MG tablet Take 1 tablet (5 mg total) by mouth daily. 30 tablet 3  . budesonide-formoterol (SYMBICORT) 160-4.5 MCG/ACT inhaler *Use as directe    . calcium-vitamin D (OSCAL WITH D) 500-200 MG-UNIT tablet Take 1 tablet by mouth daily with breakfast. 90 tablet 3  . finasteride (PROSCAR) 5 MG tablet Take 1 tablet (5 mg total) by mouth daily. 30 tablet 2  . gabapentin (NEURONTIN) 300 MG capsule Take 2 capsules (600 mg total) by mouth 2 (two) times daily. 120 capsule 2  . hydrochlorothiazide (MICROZIDE) 12.5 MG capsule Take 1 capsule (12.5 mg total) by mouth daily. 30 capsule 1  . nicotine (NICODERM CQ - DOSED IN MG/24 HOURS) 21 mg/24hr patch Place 1 patch (21 mg total) onto the skin daily. 30 patch 2  . nicotine polacrilex (NICORETTE) 2 MG gum Take 1 each (2 mg total) by mouth as needed for smoking cessation (Chew up to 24 pieces in a day). 100 tablet 1  . oxyCODONE (OXY IR/ROXICODONE) 5 MG immediate release tablet TAKE 1 TABLET BY MOUTH EVERY 4 HOURS AS NEEDED 180 tablet 0  . oxyCODONE (OXYCONTIN) 40 mg 12 hr tablet Take 1 tablet (40 mg total) by mouth every 12 (twelve) hours. 60 tablet 0  . pantoprazole (PROTONIX) 40 MG tablet Take 1 tablet (40 mg total) by mouth daily. 30 tablet 3  . predniSONE (DELTASONE) 5 MG tablet Take 1 tablet (5 mg total) by mouth daily with breakfast. (do not take prior to starting Abiraterone) 90 tablet 1  . senna-docusate (SENOKOT-S) 8.6-50 MG tablet TAKE 1 TABLET BY MOUTH 2 TIMES DAILY 60 tablet 1   . sildenafil (VIAGRA) 50 MG tablet Take 1 tablet (50 mg total) by mouth daily as needed for erectile dysfunction. 30 tablet 0  . SYMBICORT 160-4.5 MCG/ACT inhaler Inhale 2 puffs into the lungs 2 (two) times daily. 10.2 g 2  . tamsulosin (FLOMAX) 0.4 MG CAPS capsule Take 1 capsule (0.4 mg total) by mouth in the morning and at bedtime. 60 capsule 1  . traZODone (DESYREL) 50 MG tablet Take 1 tablet (50 mg total) by mouth at bedtime. 30 tablet 0  . TRELEGY ELLIPTA 100-62.5-25 MCG/INH AEPB Inhale 2 puffs into the lungs daily. 180 each 0   No current facility-administered medications for this visit.    REVIEW OF SYSTEMS:   Constitutional: ( - ) fevers, ( - )  chills , ( - ) night sweats Eyes: ( - ) blurriness of vision, ( - ) double vision, ( - ) watery eyes Ears, nose, mouth, throat, and face: ( - ) mucositis, ( - ) sore throat Respiratory: ( - ) cough, ( - ) dyspnea, ( - ) wheezes Cardiovascular: ( - ) palpitation, ( - ) chest discomfort, ( - ) lower extremity swelling Gastrointestinal:  ( - ) nausea, ( - ) heartburn, ( - ) change in bowel habits Skin: ( - ) abnormal skin rashes Lymphatics: ( - ) new lymphadenopathy, ( - ) easy bruising Neurological: ( - ) numbness, ( - ) tingling, ( - ) new weaknesses Behavioral/Psych: ( - ) mood change, ( - ) new changes  All other systems were reviewed with the patient and are negative.  PHYSICAL EXAMINATION:  Vitals:   07/03/20 1142  BP: 110/66  Pulse: 95  Resp: 18  Temp: (!) 97 F (36.1 C)  SpO2: 97%   Filed Weights   07/03/20 1142  Weight: 138 lb (62.6 kg)    GENERAL: well appearing elderly African American male in NAD. SKIN: skin color, texture, turgor are normal, no rashes or significant lesions EYES: conjunctiva are pink and non-injected, sclera clear LUNGS: clear to auscultation and percussion with normal breathing effort HEART: regular rate & rhythm and no murmurs and no lower extremity edema Musculoskeletal: no cyanosis of digits  and no clubbing  PSYCH: alert & oriented x 3, fluent speech NEURO: no focal motor/sensory deficits  LABORATORY DATA:  I have reviewed the data as listed CBC Latest Ref Rng & Units 07/03/2020 06/01/2020 05/18/2020  WBC 4.0 - 10.5 K/uL 4.4 4.9 4.2  Hemoglobin 13.0 - 17.0 g/dL 10.8(L) 11.2(L) 11.3(L)  Hematocrit 39.0 - 52.0 % 32.4(L) 33.9(L) 34.3(L)  Platelets 150 - 400 K/uL 321 316 363    CMP Latest Ref Rng & Units 07/03/2020 06/01/2020 05/18/2020  Glucose 70 - 99 mg/dL 110(H) 170(H) 104(H)  BUN 8 - 23 mg/dL 10 12 14   Creatinine 0.61 - 1.24 mg/dL 0.80 0.83 0.86  Sodium 135 - 145 mmol/L 140 139 139  Potassium 3.5 - 5.1 mmol/L 4.1 3.5 3.9  Chloride 98 - 111 mmol/L 102 101 102  CO2 22 - 32 mmol/L 31 27 26   Calcium 8.9 - 10.3 mg/dL 9.6 8.7(L) 9.3  Total Protein 6.5 - 8.1 g/dL 7.1 7.1 8.2(H)  Total Bilirubin 0.3 - 1.2 mg/dL 0.3 0.2(L) 0.3  Alkaline Phos 38 - 126 U/L 76 81 101  AST 15 - 41 U/L 10(L) 10(L) 12(L)  ALT 0 - 44 U/L <6 <6 8    RADIOGRAPHIC STUDIES: No results found.  ASSESSMENT & PLAN MACHI WHITTAKER 65 y.o. male with medical history significant for metastatic prostate cancer who presents for a follow up visit.  Dustin Alvarez has excessively transitioned off the Casodex and has received his first dosages of Zometa and Lupron.  His PSA continues to trend downward and is testosterone is at castrate level.  Given this we started abiraterone therapy 1000 mg p.o. daily with 5 mg of prednisone.  In the interim he has completed palliative radiation therapy.   The major symptom the patient has been experiencing has been pain.  His spinal sacral lesion has been poorly controlled on oxycodone therapy.  He did previously receive radiation therapy in June 2021, but due to continued excruciating pain we asked for him to be reexamined by radiation oncology to see if there is any further intervention they  can offer at this time. He has had further palliative radiation, though this has not relieved his  pain. Continue OxyContin to 30 mg twice daily (per his request, issues with constipation at higher doses) and additionally continue his as needed fast acting oxycodone. We will continue gabapentin at 300mg  BID.  Due to the need for monitoring of abiraterone we will plan to have him back in 4 weeks for clinic visit.  PSA 07/13/2019: PSA 819 08/27/2019: 226 11/05/2019: 44.8 12/08/2019: 27.3 12/27/2019: 21.2  02/24/2020: 12.1 03/22/2020: PSA 8.8 05/18/2020: PSA 4.1  # Metastatic Castrate Sensitive Prostate Cancer, Metastatic to Bone --findings are most consistent with metastatic adenocarcinoma of the prostate.  --testosterone at last visit was <3, PSA down to 8.8 ( from 819 at diagnosis)  --Administered Zometa 4mg  IV and Lupron 22.5 mg on 06/01/2020. Next due July 2022 (3 month intervals).  --continue abiraterone 1000mg  with prednisone 5mg  PO daily. Assure he is compliant with the full dose and steroids.  --patient due for a repeat CT scan and NM bone scan in May 2022. Repeat q 3 months. We are currently having global supply issues with contrast material. Given his stable PSA we can hold on CT scan to help preserve our limited stores of contrast.  --RTC in 4 weeks for continued monitoring on Abiraterone therapy  # Erectile Dysfunction --patient trialed on cialis and Viagra with sub-optimal results --patient requesting "something stronger" --will refer to urology for assistance in managing his ED.   #Symptom Management --patient requested a refill of viagra due to issues with ED  --gabapentin 600 mg BID to help with pain.  --continue oxycodone 5-10mg  q4H PRN for pain control. --continue oxycontin 30mg  q12H for basal pain control --established care with Radiation Oncology to help with painful bone lesions. Radiation therapy completed 02/01/2021.  --referral to neurosurgery for consideration of nerve block. Visit scheduled for 07/14/2020.   Orders Placed This Encounter  Procedures  . Ambulatory  referral to Urology    Referral Priority:   Routine    Referral Type:   Consultation    Referral Reason:   Specialty Services Required    Referred to Provider:   Ceasar Mons, MD    Requested Specialty:   Urology    Number of Visits Requested:   1    All questions were answered. The patient knows to call the clinic with any problems, questions or concerns.  A total of more than 30 minutes were spent on this encounter and over half of that time was spent on counseling and coordination of care as outlined above.   Dustin Peoples, MD Department of Hematology/Oncology [facility] Phone: [phone] Pager: [phone] Email: [email or web address]  07/03/2020 12:54 PM

## 2020-07-04 LAB — TESTOSTERONE: Testosterone: <3 ng/dL — ABNORMAL LOW (ref 264–916)

## 2020-07-04 LAB — PROSTATE-SPECIFIC AG, SERUM (LABCORP): Prostate Specific Ag, Serum: 2.7 ng/mL (ref 0.0–4.0)

## 2020-07-07 ENCOUNTER — Encounter: Payer: Self-pay | Admitting: Hematology and Oncology

## 2020-07-09 DIAGNOSIS — N529 Male erectile dysfunction, unspecified: Secondary | ICD-10-CM | POA: Insufficient documentation

## 2020-07-09 NOTE — Progress Notes (Deleted)
Office Visit   Patient ID: Dustin Alvarez, male    DOB: [birth year], 65 y.o.   MRN: [id number]   PCP: [name], MD   Subjective:  CC: hypertension, COPD, depression, metastatic prostate cancer  Dustin Alvarez is a 65 y.o. year old male who presents for follow up of his chronic medical conditions. Please refer to problem based charting for assessment and plan.    ACTIVE MEDICATIONS   Outpatient Medications Prior to Visit  Medication Sig Dispense Refill  . abiraterone acetate (ZYTIGA) 250 MG tablet TAKE 4 TABLETS BY MOUTH DAILY. TAKE ON AN EMPTY STOMACH 1 HOUR BEFORE OR 2 HOURS AFTER A MEAL 120 tablet 2  . albuterol (VENTOLIN HFA) 108 (90 Base) MCG/ACT inhaler Inhale 2 puffs into the lungs every 6 (six) hours as needed for wheezing or shortness of breath. 8.5 g 3  . amLODipine (NORVASC) 5 MG tablet Take 1 tablet (5 mg total) by mouth daily. 30 tablet 3  . budesonide-formoterol (SYMBICORT) 160-4.5 MCG/ACT inhaler *Use as directe    . calcium-vitamin D (OSCAL WITH D) 500-200 MG-UNIT tablet Take 1 tablet by mouth daily with breakfast. 90 tablet 3  . finasteride (PROSCAR) 5 MG tablet Take 1 tablet (5 mg total) by mouth daily. 30 tablet 2  . gabapentin (NEURONTIN) 300 MG capsule Take 2 capsules (600 mg total) by mouth 2 (two) times daily. 120 capsule 2  . hydrochlorothiazide (MICROZIDE) 12.5 MG capsule Take 1 capsule (12.5 mg total) by mouth daily. 30 capsule 1  . nicotine (NICODERM CQ - DOSED IN MG/24 HOURS) 21 mg/24hr patch Place 1 patch (21 mg total) onto the skin daily. 30 patch 2  . nicotine polacrilex (NICORETTE) 2 MG gum Take 1 each (2 mg total) by mouth as needed for smoking cessation (Chew up to 24 pieces in a day). 100 tablet 1  . oxyCODONE (OXY IR/ROXICODONE) 5 MG immediate release tablet TAKE 1 TABLET BY MOUTH EVERY 4 HOURS AS NEEDED 180 tablet 0  . oxyCODONE (OXYCONTIN) 40 mg 12 hr tablet Take 1 tablet (40 mg total) by mouth every 12 (twelve) hours. 60 tablet 0  .  pantoprazole (PROTONIX) 40 MG tablet Take 1 tablet (40 mg total) by mouth daily. 30 tablet 3  . predniSONE (DELTASONE) 5 MG tablet Take 1 tablet (5 mg total) by mouth daily with breakfast. (do not take prior to starting Abiraterone) 90 tablet 1  . senna-docusate (SENOKOT-S) 8.6-50 MG tablet TAKE 1 TABLET BY MOUTH 2 TIMES DAILY 60 tablet 1  . sildenafil (VIAGRA) 50 MG tablet Take 1 tablet (50 mg total) by mouth daily as needed for erectile dysfunction. 30 tablet 0  . SYMBICORT 160-4.5 MCG/ACT inhaler Inhale 2 puffs into the lungs 2 (two) times daily. 10.2 g 2  . tamsulosin (FLOMAX) 0.4 MG CAPS capsule Take 1 capsule (0.4 mg total) by mouth in the morning and at bedtime. 60 capsule 1  . traZODone (DESYREL) 50 MG tablet Take 1 tablet (50 mg total) by mouth at bedtime. 30 tablet 0  . TRELEGY ELLIPTA 100-62.5-25 MCG/INH AEPB Inhale 2 puffs into the lungs daily. 180 each 0   No facility-administered medications prior to visit.     Objective:   There were no vitals taken for this visit. Wt Readings from Last 3 Encounters:  07/03/20 138 lb (62.6 kg)  06/01/20 138 lb (62.6 kg)  05/22/20 140 lb (63.5 kg)   BP Readings from Last 3 Encounters:  07/03/20 110/66  06/01/20 100/70  05/22/20  126/76   Physical Exam  Health Maintenance:   Health Maintenance  Topic Date Due  . COVID-19 Vaccine (1) Never done  . Hepatitis C Screening  Never done  . COLONOSCOPY (Pts 45-80yrs Insurance coverage will need to be confirmed)  Never done  . Zoster Vaccines- Shingrix (1 of 2) Never done  . INFLUENZA VACCINE  09/11/2020  . PNA vac Low Risk Adult (2 of 2 - PPSV23) 10/31/2020  . TETANUS/TDAP  10/31/2029  . HPV VACCINES  Aged Out     Assessment & Plan:   Problem List Items Addressed This Visit   None      No follow-ups on file.   Pt discussed with ***  [name], MD Internal Medicine Resident PGY-2 [facility] Pager: [phone] 07/09/2020 11:24 PM

## 2020-07-12 ENCOUNTER — Encounter: Payer: Self-pay | Admitting: Hematology and Oncology

## 2020-07-12 ENCOUNTER — Telehealth: Payer: Self-pay | Admitting: *Deleted

## 2020-07-12 ENCOUNTER — Other Ambulatory Visit: Payer: Self-pay | Admitting: Hematology and Oncology

## 2020-07-12 ENCOUNTER — Other Ambulatory Visit (HOSPITAL_COMMUNITY): Payer: Self-pay

## 2020-07-12 ENCOUNTER — Other Ambulatory Visit: Payer: Self-pay | Admitting: Physician Assistant

## 2020-07-12 MED ORDER — OXYCODONE HCL ER 40 MG PO T12A: 40.0000 mg | EXTENDED_RELEASE_TABLET | Freq: Two times a day (BID) | ORAL | 0 refills | Status: DC

## 2020-07-12 MED ORDER — OXYCODONE HCL ER 40 MG PO T12A
40.0000 mg | EXTENDED_RELEASE_TABLET | Freq: Two times a day (BID) | ORAL | 0 refills | Status: DC
  Filled 2020-07-12: qty 60, 30d supply, fill #0

## 2020-07-12 NOTE — Telephone Encounter (Signed)
Received vm message from pt's daughter requesting refill of pt's oxycontin. Last filled on 06/09/20 for # [phone]

## 2020-07-13 ENCOUNTER — Other Ambulatory Visit (HOSPITAL_COMMUNITY): Payer: Self-pay

## 2020-07-13 DIAGNOSIS — M5416 Radiculopathy, lumbar region: Secondary | ICD-10-CM | POA: Insufficient documentation

## 2020-07-14 ENCOUNTER — Encounter: Payer: Medicare Other | Admitting: Internal Medicine

## 2020-07-14 DIAGNOSIS — N529 Male erectile dysfunction, unspecified: Secondary | ICD-10-CM

## 2020-07-14 DIAGNOSIS — C61 Malignant neoplasm of prostate: Secondary | ICD-10-CM

## 2020-07-19 ENCOUNTER — Other Ambulatory Visit: Payer: Self-pay | Admitting: Internal Medicine

## 2020-07-19 ENCOUNTER — Other Ambulatory Visit: Payer: Self-pay | Admitting: Student

## 2020-07-19 DIAGNOSIS — J449 Chronic obstructive pulmonary disease, unspecified: Secondary | ICD-10-CM

## 2020-07-19 NOTE — Telephone Encounter (Signed)
Next appt scheduled 6/13 with PCP.

## 2020-07-24 ENCOUNTER — Ambulatory Visit (INDEPENDENT_AMBULATORY_CARE_PROVIDER_SITE_OTHER): Payer: Medicare (Managed Care) | Admitting: Internal Medicine

## 2020-07-24 ENCOUNTER — Encounter: Payer: Self-pay | Admitting: Internal Medicine

## 2020-07-24 ENCOUNTER — Other Ambulatory Visit: Payer: Self-pay

## 2020-07-24 VITALS — BP 125/77 | HR 91 | Temp 98.4°F | Ht 71.0 in | Wt 134.1 lb

## 2020-07-24 DIAGNOSIS — G893 Neoplasm related pain (acute) (chronic): Secondary | ICD-10-CM

## 2020-07-24 DIAGNOSIS — C61 Malignant neoplasm of prostate: Secondary | ICD-10-CM

## 2020-07-24 DIAGNOSIS — B351 Tinea unguium: Secondary | ICD-10-CM

## 2020-07-24 DIAGNOSIS — I10 Essential (primary) hypertension: Secondary | ICD-10-CM | POA: Diagnosis not present

## 2020-07-24 DIAGNOSIS — Z0000 Encounter for general adult medical examination without abnormal findings: Secondary | ICD-10-CM

## 2020-07-24 DIAGNOSIS — J42 Unspecified chronic bronchitis: Secondary | ICD-10-CM

## 2020-07-24 DIAGNOSIS — N521 Erectile dysfunction due to diseases classified elsewhere: Secondary | ICD-10-CM

## 2020-07-24 DIAGNOSIS — G252 Other specified forms of tremor: Secondary | ICD-10-CM

## 2020-07-24 DIAGNOSIS — C7951 Secondary malignant neoplasm of bone: Secondary | ICD-10-CM

## 2020-07-24 DIAGNOSIS — G4700 Insomnia, unspecified: Secondary | ICD-10-CM

## 2020-07-24 MED ORDER — TRAZODONE HCL 50 MG PO TABS: 50.0000 mg | ORAL_TABLET | Freq: Every day | ORAL | 1 refills | Status: DC

## 2020-07-24 MED ORDER — ZOSTER VAC RECOMB ADJUVANTED 50 MCG/0.5ML IM SUSR: 0.5000 mL | Freq: Once | INTRAMUSCULAR | 1 refills | Status: AC

## 2020-07-24 MED ORDER — SILDENAFIL CITRATE 100 MG PO TABS: 100.0000 mg | ORAL_TABLET | Freq: Every day | ORAL | 0 refills | Status: DC | PRN

## 2020-07-24 NOTE — Progress Notes (Signed)
Office Visit   Patient ID: Dustin Alvarez, male    DOB: Nov 01, 1955, 65 y.o.   MRN: [id number]   PCP: [name], MD   Subjective:  CC: ED, hypertension, pain  Dustin Alvarez is a 65 y.o. year old male who presents for follow up chronic medical issues. Please refer to problem based charting for assessment and plan.      ACTIVE MEDICATIONS   Outpatient Medications Prior to Visit  Medication Sig Dispense Refill   abiraterone acetate (ZYTIGA) 250 MG tablet TAKE 4 TABLETS BY MOUTH DAILY. TAKE ON AN EMPTY STOMACH 1 HOUR BEFORE OR 2 HOURS AFTER A MEAL 120 tablet 2   albuterol (VENTOLIN HFA) 108 (90 Base) MCG/ACT inhaler Inhale 2 puffs into the lungs every 6 (six) hours as needed for wheezing or shortness of breath. 8.5 g 3   amLODipine (NORVASC) 5 MG tablet Take 1 tablet (5 mg total) by mouth daily. 30 tablet 3   budesonide-formoterol (SYMBICORT) 160-4.5 MCG/ACT inhaler *Use as directe     calcium-vitamin D (OSCAL WITH D) 500-200 MG-UNIT tablet Take 1 tablet by mouth daily with breakfast. 90 tablet 3   finasteride (PROSCAR) 5 MG tablet Take 1 tablet (5 mg total) by mouth daily. 30 tablet 2   gabapentin (NEURONTIN) 300 MG capsule Take 2 capsules (600 mg total) by mouth 2 (two) times daily. 120 capsule 2   nicotine (NICODERM CQ - DOSED IN MG/24 HOURS) 21 mg/24hr patch Place 1 patch (21 mg total) onto the skin daily. 30 patch 2   nicotine polacrilex (NICORETTE) 2 MG gum Take 1 each (2 mg total) by mouth as needed for smoking cessation (Chew up to 24 pieces in a day). 100 tablet 1   oxyCODONE (OXY IR/ROXICODONE) 5 MG immediate release tablet TAKE 1 TABLET BY MOUTH EVERY 4 HOURS AS NEEDED 180 tablet 0   oxyCODONE (OXYCONTIN) 40 mg 12 hr tablet Take 1 tablet (40 mg total) by mouth every 12 (twelve) hours. 60 tablet 0   pantoprazole (PROTONIX) 40 MG tablet Take 1 tablet (40 mg total) by mouth daily. 30 tablet 3   predniSONE (DELTASONE) 5 MG tablet Take 1 tablet (5 mg total) by mouth daily  with breakfast. (do not take prior to starting Abiraterone) 90 tablet 1   senna-docusate (SENOKOT-S) 8.6-50 MG tablet TAKE 1 TABLET BY MOUTH 2 TIMES DAILY 60 tablet 1   SYMBICORT 160-4.5 MCG/ACT inhaler Inhale 2 puffs into the lungs 2 (two) times daily. 10.2 g 2   tamsulosin (FLOMAX) 0.4 MG CAPS capsule Take 1 capsule (0.4 mg total) by mouth in the morning and at bedtime. 60 capsule 1   TRELEGY ELLIPTA 100-62.5-25 MCG/INH AEPB Inhale 2 puffs into the lungs daily. 180 each 0   hydrochlorothiazide (MICROZIDE) 12.5 MG capsule Take 1 capsule (12.5 mg total) by mouth daily. 30 capsule 1   sildenafil (VIAGRA) 50 MG tablet Take 1 tablet (50 mg total) by mouth daily as needed for erectile dysfunction. 30 tablet 0   traZODone (DESYREL) 50 MG tablet Take 1 tablet (50 mg total) by mouth at bedtime. 30 tablet 0   No facility-administered medications prior to visit.     Objective:   BP 125/77 (BP Location: Right Arm, Patient Position: Sitting, Cuff Size: Small)   Pulse 91   Temp 98.4 F (36.9 C) (Oral)   Ht 5\' 11"  (1.803 m)   Wt 134 lb 1.6 oz (60.8 kg)   SpO2 99%   BMI 18.70 kg/m  Wt  Readings from Last 3 Encounters:  07/24/20 134 lb 1.6 oz (60.8 kg)  07/03/20 138 lb (62.6 kg)  06/01/20 138 lb (62.6 kg)   BP Readings from Last 3 Encounters:  07/24/20 125/77  07/03/20 110/66  06/01/20 100/70   Physical Exam Constitutional:      General: He is awake.     Appearance: He is cachectic. He is not ill-appearing.  Cardiovascular:     Rate and Rhythm: Normal rate and regular rhythm.  Pulmonary:     Effort: No respiratory distress.     Breath sounds: Decreased breath sounds present. No wheezing.  Musculoskeletal:     Right lower leg: No edema.     Left lower leg: No edema.  Neurological:     Mental Status: He is alert and oriented to person, place, and time.     Cranial Nerves: Cranial nerves are intact.     Sensory: Sensation is intact.     Coordination: Romberg sign positive.  Finger-Nose-Finger Test abnormal.     Comments:  5/5 strength in the bilateral shoulders and biceps.  5/5 strength of the right RLE at the hip, knee and ankle. 5/5 strength of the left hip and knee. 4/5 strength of the left ankle.     Health Maintenance:   Health Maintenance  Topic Date Due   COVID-19 Vaccine (1) Never done   Hepatitis C Screening  Never done   Zoster Vaccines- Shingrix (1 of 2) Never done   COLONOSCOPY (Pts 45-26yrs Insurance coverage will need to be confirmed)  Never done   INFLUENZA VACCINE  09/11/2020   PNA vac Low Risk Adult (2 of 2 - PPSV23) 10/31/2020   TETANUS/TDAP  10/31/2029   HPV VACCINES  Aged Out     Assessment & Plan:   Problem List Items Addressed This Visit       Cardiovascular and Mediastinum   Hypertension - Primary    Current medications: hctz 12.5mg  daily Blood pressure is 125/77 in the office today. On chart review, it appears that it has been running even a little lower on his most recent visits.  Given his chronic disease state and stage 4 prostate cancer with bone mets, I do not feel that his blood pressure needs to be under tight control. I think a good goal would be to keep the SBP <140. Since he also wanted to increase the viagra, I discussed discontinuing hctz with him and his daughter, to decrease his risk with adverse events related to hypotension. They felt and agreed strongly with this.  Electrolytes and renal function are stable on labs today.  Plan -d/c hctz -SBP goal <140       Relevant Medications   sildenafil (VIAGRA) 100 MG tablet     Respiratory   COPD (chronic obstructive pulmonary disease) (HCC)    No exacerbation symptoms today.  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 Exam findings are characteristic of onychomycosis on multiple bilateral toenails. Discussed this and treatment with he and his daughter and they would like to proceed with starting treatment and have a referral to podiatry. LFTs are good on labs today and no evidence of liver disease at baseline.  Reviewed medication interaction checker which did not show any interactions between terbinafine and abiraterone and finasteride. Plan -start terbinafine 250mg  x12w -referral placed to podiatry       Relevant Medications   terbinafine (LAMISIL) 250 MG tablet   Other Relevant Orders   Ambulatory referral to Podiatry   CMP14 + Anion Gap (Completed)     Other   ED (erectile dysfunction) (Chronic)    Pt continues to experience issues with ED.  He has been referred to urology by his oncologist however, according to his daughter, [facility] does not accept [facility].  -I have relayed this to Dr. Lorenso Courier Pt also wanted to go back up to the 100mg  viagra, which he felt worked a little. I recommended that we hold off on this due to his high risk for hypotension and falls however increasing his dose seemed very important to him. He relates the ED to a quality of life issue. After reiterating my concerns about hypotension and falling to him, he remained adamant about trying the higher dosing and he and his daughter accepts the increased risk for falls. -dose increased to 100mg  prn -still needs to follow up with urology if able       Insomnia    Reviewed increased risk of falls associated with this. He reports that trazodone allows him to sleep and improves quality of life for him.  -trazodone 50mg  refilled and sent to the pharmacy       Cancer associated pain    Currently on oxycontin 40mg  q12h and oxycodone 5mg  q4h prn. Meds are now being managed by his oncologist and palliative team.        Healthcare maintenance    Prescription given for shingrix  vaccination.       Intention tremor    Pt notes that he started to notice difficulty writing and doing other fine motor movements with his bilateral hands in the past month or so. He also reports feeling off balance, leaning to the right. Finally, he reports sciatic type pain now involving his left leg. He is familiar with this type of pain as he has dealt with it on the right, due to his metastatic disease. Significant exam findings include an intention tremor, right ankle weakness with dorsiflexion and plantar flexion, and leans forward and to the right when standing upright with his eyes closed.   My primary concern with this is progressive of his metastatic disease. His PSA was stable on last check by oncology so, due to contrast shortage, his CT was deferred until July or August.  -message sent to Dr. Lorenso Courier in case this would change his timeline on repeat CT.          Return in about 3 months (around 10/24/2020).   Pt discussed with Dr. Marty Heck, MD Internal Medicine Resident PGY-2 [facility] Pager: [phone] 07/25/2020 3:56 PM

## 2020-07-25 ENCOUNTER — Encounter: Payer: Self-pay | Admitting: Internal Medicine

## 2020-07-25 ENCOUNTER — Other Ambulatory Visit (HOSPITAL_COMMUNITY): Payer: Self-pay

## 2020-07-25 DIAGNOSIS — G252 Other specified forms of tremor: Secondary | ICD-10-CM | POA: Insufficient documentation

## 2020-07-25 DIAGNOSIS — B351 Tinea unguium: Secondary | ICD-10-CM | POA: Insufficient documentation

## 2020-07-25 LAB — CMP14 + ANION GAP
ALT: 7 IU/L (ref 0–44)
AST: 9 IU/L (ref 0–40)
Albumin/Globulin Ratio: 1.4 (ref 1.2–2.2)
Albumin: 4.5 g/dL (ref 3.8–4.8)
Alkaline Phosphatase: 78 IU/L (ref 44–121)
Anion Gap: 13 mmol/L (ref 10.0–18.0)
BUN/Creatinine Ratio: 18 (ref 10–24)
BUN: 14 mg/dL (ref 8–27)
Bilirubin Total: 0.3 mg/dL (ref 0.0–1.2)
CO2: 27 mmol/L (ref 20–29)
Calcium: 9.9 mg/dL (ref 8.6–10.2)
Chloride: 98 mmol/L (ref 96–106)
Creatinine, Ser: 0.76 mg/dL (ref 0.76–1.27)
Globulin, Total: 3.2 g/dL (ref 1.5–4.5)
Glucose: 102 mg/dL — ABNORMAL HIGH (ref 65–99)
Potassium: 4.3 mmol/L (ref 3.5–5.2)
Sodium: 138 mmol/L (ref 134–144)
Total Protein: 7.7 g/dL (ref 6.0–8.5)
eGFR: 99 mL/min/{1.73_m2} (ref >59)

## 2020-07-25 MED ORDER — TERBINAFINE HCL 250 MG PO TABS: 250.0000 mg | ORAL_TABLET | Freq: Every day | ORAL | 0 refills | Status: AC

## 2020-07-25 NOTE — Assessment & Plan Note (Addendum)
He has been experiencing pain related to his toenails. Exam findings are characteristic of onychomycosis on multiple bilateral toenails. Discussed this and treatment with he and his daughter and they would like to proceed with starting treatment and have a referral to podiatry. LFTs are good on labs today and no evidence of liver disease at baseline. Reviewed medication interaction checker which did not show any interactions between terbinafine and abiraterone and finasteride. Plan -start terbinafine 250mg  x12w -referral placed to podiatry

## 2020-07-25 NOTE — Assessment & Plan Note (Signed)
Currently on oxycontin 40mg  q12h and oxycodone 5mg  q4h prn. Meds are now being managed by his oncologist and palliative team.

## 2020-07-25 NOTE — Assessment & Plan Note (Signed)
Prescription given for shingrix vaccination.

## 2020-07-25 NOTE — Assessment & Plan Note (Signed)
No exacerbation symptoms today.  Continue Trelegy

## 2020-07-25 NOTE — Progress Notes (Signed)
Internal Medicine Clinic Attending  Case discussed with Dr. Darrick Meigs  At the time of the visit.  We reviewed the resident's history and exam and pertinent patient test results.  I agree with the assessment, diagnosis, and plan of care documented in the resident's note. Dr. Darrick Meigs employed shared decision making with discussion of balance of risk and benefits of various symptomatic medications.  Patient has accepted risks of potential adverse events as potential improvement in QOL is his priority.

## 2020-07-25 NOTE — Assessment & Plan Note (Addendum)
Pt notes that he started to notice difficulty writing and doing other fine motor movements with his bilateral hands in the past month or so. He also reports feeling off balance, leaning to the right. Finally, he reports sciatic type pain now involving his left leg. He is familiar with this type of pain as he has dealt with it on the right, due to his metastatic disease. Significant exam findings include an intention tremor, right ankle weakness with dorsiflexion and plantar flexion, and leans forward and to the right when standing upright with his eyes closed.   My primary concern with this is progressive of his metastatic disease. His PSA was stable on last check by oncology so, due to contrast shortage, his CT was deferred until July or August.  -message sent to Dr. Lorenso Courier in case this would change his timeline on repeat CT.

## 2020-07-25 NOTE — Assessment & Plan Note (Signed)
Current medications: hctz 12.5mg  daily Blood pressure is 125/77 in the office today. On chart review, it appears that it has been running even a little lower on his most recent visits.  Given his chronic disease state and stage 4 prostate cancer with bone mets, I do not feel that his blood pressure needs to be under tight control. I think a good goal would be to keep the SBP <140. Since he also wanted to increase the viagra, I discussed discontinuing hctz with him and his daughter, to decrease his risk with adverse events related to hypotension. They felt and agreed strongly with this.  Electrolytes and renal function are stable on labs today.  Plan -d/c hctz -SBP goal <140

## 2020-07-25 NOTE — Assessment & Plan Note (Signed)
Reviewed increased risk of falls associated with this. He reports that trazodone allows him to sleep and improves quality of life for him.  -trazodone 50mg  refilled and sent to the pharmacy

## 2020-07-25 NOTE — Assessment & Plan Note (Signed)
Pt continues to experience issues with ED.  He has been referred to urology by his oncologist however, according to his daughter, [facility] does not accept [facility].  -I have relayed this to Dr. Lorenso Courier Pt also wanted to go back up to the 100mg  viagra, which he felt worked a little. I recommended that we hold off on this due to his high risk for hypotension and falls however increasing his dose seemed very important to him. He relates the ED to a quality of life issue. After reiterating my concerns about hypotension and falling to him, he remained adamant about trying the higher dosing and he and his daughter accepts the increased risk for falls. -dose increased to 100mg  prn -still needs to follow up with urology if able

## 2020-07-27 ENCOUNTER — Other Ambulatory Visit: Payer: Self-pay | Admitting: Internal Medicine

## 2020-07-31 ENCOUNTER — Other Ambulatory Visit: Payer: Self-pay | Admitting: Hematology and Oncology

## 2020-07-31 ENCOUNTER — Inpatient Hospital Stay: Payer: Medicare (Managed Care) | Attending: Hematology and Oncology | Admitting: Hematology and Oncology

## 2020-07-31 ENCOUNTER — Other Ambulatory Visit: Payer: Self-pay

## 2020-07-31 ENCOUNTER — Other Ambulatory Visit (HOSPITAL_COMMUNITY): Payer: Self-pay

## 2020-07-31 ENCOUNTER — Inpatient Hospital Stay: Payer: Medicare (Managed Care)

## 2020-07-31 VITALS — BP 128/92 | HR 81 | Temp 97.0°F | Resp 18 | Wt 135.6 lb

## 2020-07-31 DIAGNOSIS — Z888 Allergy status to other drugs, medicaments and biological substances status: Secondary | ICD-10-CM | POA: Diagnosis not present

## 2020-07-31 DIAGNOSIS — C61 Malignant neoplasm of prostate: Secondary | ICD-10-CM | POA: Diagnosis not present

## 2020-07-31 DIAGNOSIS — Z836 Family history of other diseases of the respiratory system: Secondary | ICD-10-CM | POA: Insufficient documentation

## 2020-07-31 DIAGNOSIS — N529 Male erectile dysfunction, unspecified: Secondary | ICD-10-CM | POA: Insufficient documentation

## 2020-07-31 DIAGNOSIS — Z833 Family history of diabetes mellitus: Secondary | ICD-10-CM | POA: Insufficient documentation

## 2020-07-31 DIAGNOSIS — C7951 Secondary malignant neoplasm of bone: Secondary | ICD-10-CM

## 2020-07-31 DIAGNOSIS — I10 Essential (primary) hypertension: Secondary | ICD-10-CM | POA: Diagnosis not present

## 2020-07-31 DIAGNOSIS — Z79899 Other long term (current) drug therapy: Secondary | ICD-10-CM | POA: Insufficient documentation

## 2020-07-31 DIAGNOSIS — J449 Chronic obstructive pulmonary disease, unspecified: Secondary | ICD-10-CM | POA: Insufficient documentation

## 2020-07-31 DIAGNOSIS — N522 Drug-induced erectile dysfunction: Secondary | ICD-10-CM

## 2020-07-31 DIAGNOSIS — Z923 Personal history of irradiation: Secondary | ICD-10-CM | POA: Diagnosis not present

## 2020-07-31 DIAGNOSIS — M79606 Pain in leg, unspecified: Secondary | ICD-10-CM | POA: Insufficient documentation

## 2020-07-31 DIAGNOSIS — F17210 Nicotine dependence, cigarettes, uncomplicated: Secondary | ICD-10-CM | POA: Diagnosis not present

## 2020-07-31 DIAGNOSIS — K5900 Constipation, unspecified: Secondary | ICD-10-CM | POA: Diagnosis not present

## 2020-07-31 LAB — CBC WITH DIFFERENTIAL (CANCER CENTER ONLY)
Abs Immature Granulocytes: 0.01 10*3/uL (ref 0.00–0.07)
Basophils Absolute: 0 10*3/uL (ref 0.0–0.1)
Basophils Relative: 0 %
Eosinophils Absolute: 0 10*3/uL (ref 0.0–0.5)
Eosinophils Relative: 0 %
HCT: 33.9 % — ABNORMAL LOW (ref 39.0–52.0)
Hemoglobin: 11.2 g/dL — ABNORMAL LOW (ref 13.0–17.0)
Immature Granulocytes: 0 %
Lymphocytes Relative: 15 %
Lymphs Abs: 0.7 10*3/uL (ref 0.7–4.0)
MCH: 26.4 pg (ref 26.0–34.0)
MCHC: 33 g/dL (ref 30.0–36.0)
MCV: 80 fL (ref 80.0–100.0)
Monocytes Absolute: 0.5 10*3/uL (ref 0.1–1.0)
Monocytes Relative: 11 %
Neutro Abs: 3.3 10*3/uL (ref 1.7–7.7)
Neutrophils Relative %: 74 %
Platelet Count: 273 10*3/uL (ref 150–400)
RBC: 4.24 MIL/uL (ref 4.22–5.81)
RDW: 14.9 % (ref 11.5–15.5)
WBC Count: 4.4 10*3/uL (ref 4.0–10.5)
nRBC: 0 % (ref 0.0–0.2)

## 2020-07-31 LAB — CMP (CANCER CENTER ONLY)
ALT: 13 U/L (ref 0–44)
AST: 14 U/L — ABNORMAL LOW (ref 15–41)
Albumin: 4.1 g/dL (ref 3.5–5.0)
Alkaline Phosphatase: 64 U/L (ref 38–126)
Anion gap: 8 (ref 5–15)
BUN: 15 mg/dL (ref 8–23)
CO2: 28 mmol/L (ref 22–32)
Calcium: 9.1 mg/dL (ref 8.9–10.3)
Chloride: 100 mmol/L (ref 98–111)
Creatinine: 0.83 mg/dL (ref 0.61–1.24)
GFR, Estimated: >60 mL/min (ref >60)
Glucose, Bld: 117 mg/dL — ABNORMAL HIGH (ref 70–99)
Potassium: 3.9 mmol/L (ref 3.5–5.1)
Sodium: 136 mmol/L (ref 135–145)
Total Bilirubin: 0.5 mg/dL (ref 0.3–1.2)
Total Protein: 7.5 g/dL (ref 6.5–8.1)

## 2020-07-31 NOTE — Progress Notes (Signed)
[facility] [place] [phone]   [phone]  PROGRESS NOTE  Patient Care Team: [name], MD as PCP - General (Internal Medicine) Cira Rue, RN Nurse Navigator as Registered Nurse (Medical Oncology)  Hematological/Oncological History # Metastatic Castrate Sensitive Prostate Cancer, Metastatic to Bone 1) 07/13/2019: Abdomen/Pelvis CT extensive lytic changes with superimposed pathological fractures. Lytic change noted in the right iliac bone and new lucencies in the right T10 vertebrae.  2) 07/19/2019: biopsy of sacral mass shows metastatic prostatic adenocarcinoma, Gleason 4+4.  3) 07/2019: reportedly received Zometa 4g IV and eligard 22.5. Started on Casodex.  4) 6/10-6/16/2021: received palliative radiation to the sacrum at [place]. Received 2000cGy over 6 days 5) Moved to [place]. Lost to follow up from [facility] in [place], [place]. 6) 11/05/2019: establish care with Dr. Lorenso Courier  7) 11/16/2019: Administered Zometa 4mg  IV and Lupron 22.5 mg 8) 12/08/2019: started abiraterone 1000mg  PO daily  9) 03/09/2020: Administered Zometa 4mg  IV and Lupron 22.5 mg 10) 06/01/2020: Administered Zometa 4mg  IV and Lupron 22.5 mg  Interval History:  Dustin Alvarez 65 y.o. male with medical history significant for metastatic prostate cancer who presents for a follow up visit. The patient's last visit was on 07/03/2020. In the interim since the last visit he has had no major changes in health.  On exam today Dustin Alvarez reports he continues to struggle with pain.  His pain is 8 out of 10 in severity predominantly in the back down his legs.  He notes that he can only get 2 hours of sleep at a time and he still cannot sit in a chair.  He reports that his balance remains off but fortunately he has had no falls.  He notes that he has been seen by neurosurgery who provided and spinal shots but they "did not help".  He notes he been doing his best to eat well but  otherwise has had no side effects as result of his medication.   He otherwise denies any fevers, chills, sweats, nausea, vomiting or diarrhea.  He notes he is tolerating abiraterone therapy well and has no questions or concerns regarding his care.  A full 10 point ROS is listed below.  MEDICAL HISTORY:  Past Medical History:  Diagnosis Date   Asthma    COPD (chronic obstructive pulmonary disease) (HCC)    Hypertension    Prostate cancer ([place])     SURGICAL HISTORY: Past Surgical History:  Procedure Laterality Date   WRIST SURGERY Left 2017    SOCIAL HISTORY: Social History   Socioeconomic History   Marital status: Legally Separated    Spouse name: Not on file   Number of children: Not on file   Years of education: Not on file   Highest education level: Not on file  Occupational History   Not on file  Tobacco Use   Smoking status: Every Day    Packs/day: 1.00    Pack years: 0.00    Types: Cigarettes   Smokeless tobacco: Never   Tobacco comments:    almost 1 pkd. Wants patches   Vaping Use   Vaping Use: Never used  Substance and Sexual Activity   Alcohol use: Yes    Comment: 1-2 drinks per week.   Drug use: Never   Sexual activity: Not Currently  Other Topics Concern   Not on file  Social History Narrative   Not on file   Social Determinants of Health   Financial Resource Strain: Not on file  Food Insecurity: Not on file  Transportation Needs: Not on file  Physical Activity: Not on file  Stress: Not on file  Social Connections: Not on file  Intimate Partner Violence: Not on file    FAMILY HISTORY: Family History  Problem Relation Age of Onset   COPD Father    Diabetes Sister    Diabetes Brother    Breast cancer Neg Hx    Prostate cancer Neg Hx    Colon cancer Neg Hx    Pancreatic cancer Neg Hx     ALLERGIES:  is allergic to ace inhibitors and lisinopril.  MEDICATIONS:  Current Outpatient Medications  Medication Sig Dispense Refill    abiraterone acetate (ZYTIGA) 250 MG tablet TAKE 4 TABLETS BY MOUTH DAILY. TAKE ON AN EMPTY STOMACH 1 HOUR BEFORE OR 2 HOURS AFTER A MEAL 120 tablet 2   albuterol (VENTOLIN HFA) 108 (90 Base) MCG/ACT inhaler Inhale 2 puffs into the lungs every 6 (six) hours as needed for wheezing or shortness of breath. 8.5 g 3   amLODipine (NORVASC) 5 MG tablet Take 1 tablet (5 mg total) by mouth daily. 30 tablet 3   budesonide-formoterol (SYMBICORT) 160-4.5 MCG/ACT inhaler *Use as directe     calcium-vitamin D (OSCAL WITH D) 500-200 MG-UNIT tablet Take 1 tablet by mouth daily with breakfast. 90 tablet 3   finasteride (PROSCAR) 5 MG tablet Take 1 tablet (5 mg total) by mouth daily. 30 tablet 2   gabapentin (NEURONTIN) 300 MG capsule Take 2 capsules (600 mg total) by mouth 2 (two) times daily. 120 capsule 2   nicotine (NICODERM CQ - DOSED IN MG/24 HOURS) 21 mg/24hr patch Place 1 patch (21 mg total) onto the skin daily. 30 patch 2   nicotine polacrilex (NICORETTE) 2 MG gum Take 1 each (2 mg total) by mouth as needed for smoking cessation (Chew up to 24 pieces in a day). 100 tablet 1   oxyCODONE (OXY IR/ROXICODONE) 5 MG immediate release tablet TAKE 1 TABLET BY MOUTH EVERY 4 HOURS AS NEEDED 180 tablet 0   oxyCODONE (OXYCONTIN) 40 mg 12 hr tablet Take 1 tablet (40 mg total) by mouth every 12 (twelve) hours. 60 tablet 0   pantoprazole (PROTONIX) 40 MG tablet Take 1 tablet (40 mg total) by mouth daily. 30 tablet 3   predniSONE (DELTASONE) 5 MG tablet Take 1 tablet (5 mg total) by mouth daily with breakfast. (do not take prior to starting Abiraterone) 90 tablet 1   senna-docusate (SENOKOT-S) 8.6-50 MG tablet TAKE 1 TABLET BY MOUTH 2 TIMES DAILY 60 tablet 1   sildenafil (VIAGRA) 100 MG tablet Take 1 tablet (100 mg total) by mouth daily as needed for erectile dysfunction. 30 tablet 0   SYMBICORT 160-4.5 MCG/ACT inhaler Inhale 2 puffs into the lungs 2 (two) times daily. 10.2 g 2   tamsulosin (FLOMAX) 0.4 MG CAPS capsule Take 1  capsule (0.4 mg total) by mouth in the morning and at bedtime. 60 capsule 1   terbinafine (LAMISIL) 250 MG tablet Take 1 tablet (250 mg total) by mouth daily. 84 tablet 0   traZODone (DESYREL) 50 MG tablet Take 1 tablet (50 mg total) by mouth at bedtime. 90 tablet 1   TRELEGY ELLIPTA 100-62.5-25 MCG/INH AEPB Inhale 2 puffs into the lungs daily. 180 each 0   No current facility-administered medications for this visit.    REVIEW OF SYSTEMS:   Constitutional: ( - ) fevers, ( - )  chills , ( - ) night sweats Eyes: ( - )  blurriness of vision, ( - ) double vision, ( - ) watery eyes Ears, nose, mouth, throat, and face: ( - ) mucositis, ( - ) sore throat Respiratory: ( - ) cough, ( - ) dyspnea, ( - ) wheezes Cardiovascular: ( - ) palpitation, ( - ) chest discomfort, ( - ) lower extremity swelling Gastrointestinal:  ( - ) nausea, ( - ) heartburn, ( - ) change in bowel habits Skin: ( - ) abnormal skin rashes Lymphatics: ( - ) new lymphadenopathy, ( - ) easy bruising Neurological: ( - ) numbness, ( - ) tingling, ( - ) new weaknesses Behavioral/Psych: ( - ) mood change, ( - ) new changes  All other systems were reviewed with the patient and are negative.  PHYSICAL EXAMINATION:  Vitals:   07/31/20 1518  BP: (!) 128/92  Pulse: 81  Resp: 18  Temp: (!) 97 F (36.1 C)  SpO2: 100%   Filed Weights   07/31/20 1518  Weight: 135 lb 9.6 oz (61.5 kg)    GENERAL: well appearing elderly African American male in NAD. SKIN: skin color, texture, turgor are normal, no rashes or significant lesions EYES: conjunctiva are pink and non-injected, sclera clear LUNGS: clear to auscultation and percussion with normal breathing effort HEART: regular rate & rhythm and no murmurs and no lower extremity edema Musculoskeletal: no cyanosis of digits and no clubbing  PSYCH: alert & oriented x 3, fluent speech NEURO: no focal motor/sensory deficits  LABORATORY DATA:  I have reviewed the data as listed CBC Latest  Ref Rng & Units 07/31/2020 07/03/2020 06/01/2020  WBC 4.0 - 10.5 K/uL 4.4 4.4 4.9  Hemoglobin 13.0 - 17.0 g/dL 11.2(L) 10.8(L) 11.2(L)  Hematocrit 39.0 - 52.0 % 33.9(L) 32.4(L) 33.9(L)  Platelets 150 - 400 K/uL 273 321 316    CMP Latest Ref Rng & Units 07/24/2020 07/03/2020 06/01/2020  Glucose 65 - 99 mg/dL 102(H) 110(H) 170(H)  BUN 8 - 27 mg/dL 14 10 12   Creatinine 0.76 - 1.27 mg/dL 0.76 0.80 0.83  Sodium 134 - 144 mmol/L 138 140 139  Potassium 3.5 - 5.2 mmol/L 4.3 4.1 3.5  Chloride 96 - 106 mmol/L 98 102 101  CO2 20 - 29 mmol/L 27 31 27   Calcium 8.6 - 10.2 mg/dL 9.9 9.6 8.7(L)  Total Protein 6.0 - 8.5 g/dL 7.7 7.1 7.1  Total Bilirubin 0.0 - 1.2 mg/dL 0.3 0.3 0.2(L)  Alkaline Phos 44 - 121 IU/L 78 76 81  AST 0 - 40 IU/L 9 10(L) 10(L)  ALT 0 - 44 IU/L 7 <6 <6    RADIOGRAPHIC STUDIES: No results found.  ASSESSMENT & PLAN Dustin Alvarez 65 y.o. male with medical history significant for metastatic prostate cancer who presents for a follow up visit.  Dustin Alvarez has excessively transitioned off the Casodex and has received his first dosages of Zometa and Lupron.  His PSA continues to trend downward and is testosterone is at castrate level.  Given this we started abiraterone therapy 1000 mg p.o. daily with 5 mg of prednisone.   The major symptom the patient has been experiencing has been pain.  His spinal sacral lesion has been poorly controlled on oxycodone therapy.  He did previously receive radiation therapy in June 2021, but due to continued excruciating pain we asked for him to be reexamined by radiation oncology to see if there is any further intervention they can offer at this time. He has had further palliative radiation, though this has not relieved his pain.  Continue OxyContin to 30 mg twice daily (per his request, issues with constipation at higher doses) and additionally continue his as needed fast acting oxycodone. We will continue gabapentin at 300mg  BID.  Due to the need for  monitoring of abiraterone we will plan to have him back in 6 weeks for clinic visit.  PSA 07/13/2019: PSA 819 08/27/2019: 226 11/05/2019: 44.8 12/08/2019: 27.3 12/27/2019: 21.2  02/24/2020: 12.1 03/22/2020: PSA 8.8 05/18/2020: PSA 4.1 07/31/2020: PSA 2.8  # Metastatic Castrate Sensitive Prostate Cancer, Metastatic to Bone --findings are most consistent with metastatic adenocarcinoma of the prostate.  --testosterone at last visit was <3, PSA down to 8.8 ( from 819 at diagnosis)  --Administered Zometa 4mg  IV and Lupron 22.5 mg on 06/01/2020. Next due July 2022 (3 month intervals).  --continue abiraterone 1000mg  with prednisone 5mg  PO daily. Assure he is compliant with the full dose and steroids.  --patient due for a repeat CT scan and NM bone scan in May 2022. Repeat q 3 months. We are currently having global supply issues with contrast material. Scans delayed until 08/09/2020.  --RTC in 6 weeks for continued monitoring on Abiraterone therapy  # Erectile Dysfunction --patient trialed on cialis and Viagra with sub-optimal results --patient requesting "something stronger" --will refer to urology for assistance in managing his ED.    #Symptom Management --patient requested a refill of viagra due to issues with ED  --gabapentin 600 mg BID to help with pain.  --continue oxycodone 5-10mg  q4H PRN for pain control. --continue oxycontin 30mg  q12H for basal pain control --established care with Radiation Oncology to help with painful bone lesions. Radiation therapy completed 02/01/2021.  --seen by neurosurgery, provided injections with no relief  No orders of the defined types were placed in this encounter.   All questions were answered. The patient knows to call the clinic with any problems, questions or concerns.  A total of more than 30 minutes were spent on this encounter and over half of that time was spent on counseling and coordination of care as outlined above.   Ledell Peoples,  MD Department of Hematology/Oncology [place] [facility] Phone: [phone] Pager: [phone] Email: [email or web address]  07/31/2020 3:31 PM

## 2020-08-01 LAB — PROSTATE-SPECIFIC AG, SERUM (LABCORP): Prostate Specific Ag, Serum: 2.8 ng/mL (ref 0.0–4.0)

## 2020-08-01 LAB — TESTOSTERONE: Testosterone: <3 ng/dL — ABNORMAL LOW (ref 264–916)

## 2020-08-02 ENCOUNTER — Other Ambulatory Visit: Payer: Self-pay | Admitting: Internal Medicine

## 2020-08-03 ENCOUNTER — Other Ambulatory Visit: Payer: Self-pay | Admitting: Hematology and Oncology

## 2020-08-03 ENCOUNTER — Other Ambulatory Visit (HOSPITAL_COMMUNITY): Payer: Self-pay

## 2020-08-03 MED ORDER — OXYCODONE HCL ER 40 MG PO T12A
40.0000 mg | EXTENDED_RELEASE_TABLET | Freq: Two times a day (BID) | ORAL | 0 refills | Status: DC
  Filled 2020-08-03 – 2020-08-15 (×2): qty 60, 30d supply, fill #0

## 2020-08-03 MED ORDER — OXYCODONE HCL 5 MG PO TABS: ORAL_TABLET | ORAL | 0 refills | Status: DC | PRN

## 2020-08-04 ENCOUNTER — Other Ambulatory Visit: Payer: Self-pay | Admitting: Hematology and Oncology

## 2020-08-04 ENCOUNTER — Other Ambulatory Visit (HOSPITAL_COMMUNITY): Payer: Self-pay

## 2020-08-05 ENCOUNTER — Other Ambulatory Visit (HOSPITAL_COMMUNITY): Payer: Self-pay

## 2020-08-06 ENCOUNTER — Encounter: Payer: Self-pay | Admitting: Hematology and Oncology

## 2020-08-07 ENCOUNTER — Other Ambulatory Visit (HOSPITAL_COMMUNITY): Payer: Self-pay

## 2020-08-07 ENCOUNTER — Telehealth: Payer: Self-pay | Admitting: Hematology and Oncology

## 2020-08-07 ENCOUNTER — Encounter: Payer: Medicare (Managed Care) | Admitting: Pharmacist

## 2020-08-07 NOTE — Telephone Encounter (Signed)
Scheduled appt per 6/26 sch msg. Called pt, no answer and no voicemail set up. Will mail updated calendar to pt.

## 2020-08-07 NOTE — Progress Notes (Deleted)
   Subjective:    Patient ID: Dustin Alvarez, male    DOB: [birth year], 65 y.o.   MRN: [id number]  HPI  Patient is a 65 y.o. male who presents for medication review and management.  He is in *** spirits and presents {w-w/o:315700} assistance. Patient was referred and last seen by Primary Care Provider 07/24/20.  Medication Adherence Questionnaire (A score of 2 or more points indicates risk for nonadherence)  Do you know what each of your medicines is for? *** (1 point if no)  Do you ever have trouble remembering to take your medicine? *** (2 points if yes)  Do you ever not take a medicine because you feel you do not need it?  *** (1 point if yes)  Do you think that any of your medicines is not helping you? *** (1 point if yes)  Do you have any physical problems such as vision loss that keep you from taking your medicines as prescribed?  *** (2 points if yes)  Do you think any of your medicine is causing a side effect?  *** (1 point if yes)  Do you know the names of ALL of your medicines? *** (1 point if no)  Do you think that you need ALL of your medicines? *** (1 point if no)  In the past 6 months, have you missed getting a refill or a new prescription filled on time?  *** (1 point if yes)  How often do you miss taking a dose of medicine?  *** Never (0 points), 1 or 2 times a month (1 points), 1 time a week (2 points), 2 or more times a week (3 points).   TOTAL SCORE ***/14    Objective:   Labs:   Physical Exam  ROS  There were no vitals filed for this visit.  Assessment/Plan:   Understanding of regimen: {excellent/good/fair/poor:19665}  Understanding of indications: {excellent/good/fair/poor:19665}  Potential of compliance: {excellent/good/fair/poor:19665}  Patient has {MISC; ADHERENCE:18335} based on score of *** for questionnaire. Barriers include: ***lack of knowledge, forgetfulness, lack of belief in necessity of treatment, concern for more harm than good, physical  barriers such as vision loss, cost. Medication list reviewed and updated. The following issues were noted ***. Patient was provided with a printed medication list.   Follow-up appointment ***. Written patient instructions provided.  This appointment required *** minutes of patient care (this includes precharting, chart review, review of results, and face-to-face care).  Thank you for involving pharmacy to assist in providing this patient's care.  Patient seen with ***

## 2020-08-08 ENCOUNTER — Telehealth: Payer: Self-pay | Admitting: Hematology and Oncology

## 2020-08-08 NOTE — Telephone Encounter (Signed)
Scheduled appt per 6/26 sch msg. Spoke to Carrizo who handles all of pts appt. She is aware.

## 2020-08-09 ENCOUNTER — Ambulatory Visit (HOSPITAL_COMMUNITY): Payer: Medicare (Managed Care)

## 2020-08-09 ENCOUNTER — Other Ambulatory Visit (HOSPITAL_COMMUNITY): Payer: Medicare (Managed Care)

## 2020-08-10 ENCOUNTER — Ambulatory Visit (INDEPENDENT_AMBULATORY_CARE_PROVIDER_SITE_OTHER): Payer: Medicare (Managed Care) | Admitting: Pharmacist

## 2020-08-10 DIAGNOSIS — J449 Chronic obstructive pulmonary disease, unspecified: Secondary | ICD-10-CM

## 2020-08-10 DIAGNOSIS — J441 Chronic obstructive pulmonary disease with (acute) exacerbation: Secondary | ICD-10-CM | POA: Insufficient documentation

## 2020-08-10 NOTE — Patient Instructions (Signed)
Mr. Castilleja it was a pleasure seeing you today.   Today we reviewed all of the medications you are currently taking. Included is an updated medication list. Please continue taking all medications as prescribed on this list.  If you have any questions please call the clinic and ask to speak with me.  Follow-up with PCP at next schedule appt

## 2020-08-10 NOTE — Progress Notes (Signed)
   Subjective:    Patient ID: Dustin Alvarez, male    DOB: May 13, 1955, 65 y.o.   MRN: [id number]  HPI Patient is a 65 y.o. male who presents for medication review and management. He is in good spirits and presents with assistance of wheelchair and his daughter. Patient was referred and last seen by Primary Care Provider on 07/27/20.  Medication Adherence Questionnaire (A score of 2 or more points indicates risk for nonadherence)  Do you know what each of your medicines is for? 0 (1 point if no)  Do you ever have trouble remembering to take your medicine? 0 (2 points if yes)  Do you ever not take a medicine because you feel you do not need it?  0 (1 point if yes)  Do you think that any of your medicines is not helping you? 1; does not feel like Viagra is working (1 point if yes)  Do you have any physical problems such as vision loss that keep you from taking your medicines as prescribed?  0 (2 points if yes)  Do you think any of your medicine is causing a side effect? 1; patient states he sometimes feels nauseous (1 point if yes)  Do you know the names of ALL of your medicines? 0 (1 point if no)  Do you think that you need ALL of your medicines? 0 (1 point if no)  In the past 6 months, have you missed getting a refill or a new prescription filled on time? 0 (1 point if yes)  How often do you miss taking a dose of medicine?  1 Never (0 points), 1 or 2 times a month (1 points), 1 time a week (2 points), 2 or more times a week (3 points).   TOTAL SCORE 3/14    Objective:   Labs:   Physical Exam Neurological:     Mental Status: He is alert and oriented to person, place, and time.    Vitals:   08/10/20 1606  BP: 131/84  Pulse: 79    Assessment/Plan:   Understanding of regimen: good  Understanding of indications: good  Potential of compliance: good  Patient has known adherence challenges based on score of 3 for questionnaire. Patient's daughter helps remove most of patient's  barriers by packaging his medications for him so he can take it daily. Medication list reviewed and updated. Answered questions from both patient and daughter related to medications. Offered to provide list of pharmacies that package medications and deliver them to home. The following issues were noted: patient taking both Symbicort and Trelegy. Note from last PCP visit recommends continuing Trelegy so discontinued Symbicort from med list and discussed with patient. Patient was provided with a printed medication list.   Follow-up with PCP at next scheduled appt. Written patient instructions provided.  This appointment required 45 minutes of patient care (this includes precharting, chart review, review of results, and face-to-face care).  Thank you for involving pharmacy to assist in providing this patient's care.

## 2020-08-15 ENCOUNTER — Encounter: Payer: Self-pay | Admitting: Hematology and Oncology

## 2020-08-15 ENCOUNTER — Other Ambulatory Visit (HOSPITAL_COMMUNITY): Payer: Self-pay

## 2020-08-15 ENCOUNTER — Other Ambulatory Visit: Payer: Self-pay | Admitting: Physician Assistant

## 2020-08-15 MED ORDER — OXYCODONE HCL ER 40 MG PO T12A: 40.0000 mg | EXTENDED_RELEASE_TABLET | Freq: Two times a day (BID) | ORAL | 0 refills | Status: DC

## 2020-08-15 MED ORDER — OXYCODONE HCL ER 40 MG PO T12A
40.0000 mg | EXTENDED_RELEASE_TABLET | Freq: Two times a day (BID) | ORAL | 0 refills | Status: DC
  Filled 2020-08-15: qty 60, 30d supply, fill #0

## 2020-08-16 ENCOUNTER — Telehealth: Payer: Self-pay

## 2020-08-16 DIAGNOSIS — M51369 Other intervertebral disc degeneration, lumbar region without mention of lumbar back pain or lower extremity pain: Secondary | ICD-10-CM | POA: Insufficient documentation

## 2020-08-16 DIAGNOSIS — M5136 Other intervertebral disc degeneration, lumbar region: Secondary | ICD-10-CM | POA: Insufficient documentation

## 2020-08-16 NOTE — Telephone Encounter (Signed)
Notified patient of Prior Authorization denial for Oxycontin 40 er tablets. Patient's Insurance requesting that he have tried and failed  atleast 2 other covered medications

## 2020-08-17 ENCOUNTER — Telehealth: Payer: Self-pay

## 2020-08-17 ENCOUNTER — Other Ambulatory Visit (HOSPITAL_COMMUNITY): Payer: Self-pay

## 2020-08-17 NOTE — Telephone Encounter (Signed)
Notified Patient of Prior Authorization Approval of Oxycontin. No further needs at this time.

## 2020-08-17 NOTE — Telephone Encounter (Signed)
Notified Patient that an appeal process had been started for prior authorization denial of Oxycontin and that a decision should be made within 72 hours.

## 2020-08-23 ENCOUNTER — Other Ambulatory Visit (HOSPITAL_COMMUNITY): Payer: Self-pay

## 2020-08-25 ENCOUNTER — Other Ambulatory Visit (HOSPITAL_COMMUNITY): Payer: Self-pay

## 2020-08-28 ENCOUNTER — Other Ambulatory Visit (HOSPITAL_COMMUNITY): Payer: Self-pay

## 2020-08-28 ENCOUNTER — Other Ambulatory Visit: Payer: Self-pay | Admitting: Hematology and Oncology

## 2020-09-04 ENCOUNTER — Telehealth: Payer: Self-pay | Admitting: *Deleted

## 2020-09-04 ENCOUNTER — Other Ambulatory Visit: Payer: Self-pay | Admitting: Hematology and Oncology

## 2020-09-04 MED ORDER — OXYCODONE HCL 5 MG PO TABS: ORAL_TABLET | ORAL | 0 refills | Status: DC | PRN

## 2020-09-04 NOTE — Telephone Encounter (Signed)
Received vm message from pt requesting a refill on his oxycodone 5 mg tabs. Last filled on 08/03/20 for 180 tablets

## 2020-09-11 ENCOUNTER — Telehealth: Payer: Self-pay | Admitting: Hematology and Oncology

## 2020-09-11 NOTE — Telephone Encounter (Signed)
R/s 8/8 appt due to provider on call scheduled. Called and spoke with patient. Confirmed new date and time

## 2020-09-15 ENCOUNTER — Other Ambulatory Visit (HOSPITAL_COMMUNITY): Payer: Self-pay

## 2020-09-15 ENCOUNTER — Telehealth: Payer: Self-pay

## 2020-09-15 ENCOUNTER — Telehealth: Payer: Self-pay | Admitting: *Deleted

## 2020-09-15 NOTE — Telephone Encounter (Signed)
Received notification from our [facility] that they had made several attempts to reach Dustin Alvarez and his son about his Zytiga refill and have not been able to reach either and have not received a return phone call.   I also attempted to reach patient and voicemail was full.  The last time it was dispensed was 6/27 for 30 days.  [place] [facility] [place] Phone [phone] Fax [phone] 09/15/2020 1:56 PM

## 2020-09-15 NOTE — Telephone Encounter (Signed)
TCT patient's adult child, [name] regarding pt's Ztiga. Spoke with her and advised that [facility] pharmacy has been trying to get hold of Mr.Tavares to deliver his Ztiga. Pt's vm is full and they have been unable to leave him a message. [name] states she will go and check his supply if Zytiga. Advised that the last refill was on 08/07/20. [name] will check and call back.    She does say that pt needs refill of his Oxycontin.  Dr. Lorenso Courier made aware.

## 2020-09-18 ENCOUNTER — Other Ambulatory Visit (HOSPITAL_COMMUNITY): Payer: Self-pay

## 2020-09-18 ENCOUNTER — Ambulatory Visit: Payer: Medicare (Managed Care) | Admitting: Hematology and Oncology

## 2020-09-18 ENCOUNTER — Other Ambulatory Visit: Payer: Self-pay | Admitting: Internal Medicine

## 2020-09-18 ENCOUNTER — Other Ambulatory Visit: Payer: Medicare (Managed Care)

## 2020-09-18 ENCOUNTER — Other Ambulatory Visit: Payer: Self-pay | Admitting: Hematology and Oncology

## 2020-09-18 MED ORDER — OXYCODONE HCL ER 40 MG PO T12A
40.0000 mg | EXTENDED_RELEASE_TABLET | Freq: Two times a day (BID) | ORAL | 0 refills | Status: DC
  Filled 2020-09-18: qty 4, 2d supply, fill #0
  Filled 2020-09-18: qty 56, 28d supply, fill #0

## 2020-09-18 MED ORDER — OXYCODONE HCL ER 40 MG PO T12A: 40.0000 mg | EXTENDED_RELEASE_TABLET | Freq: Two times a day (BID) | ORAL | 0 refills | Status: DC

## 2020-09-18 MED ORDER — ABIRATERONE ACETATE 250 MG PO TABS
ORAL_TABLET | ORAL | 2 refills | Status: DC
  Filled 2020-09-18: qty 120, 30d supply, fill #0
  Filled 2020-10-16: qty 120, 30d supply, fill #1
  Filled 2020-11-09: qty 120, 30d supply, fill #2

## 2020-09-19 ENCOUNTER — Other Ambulatory Visit (HOSPITAL_COMMUNITY): Payer: Self-pay

## 2020-09-21 ENCOUNTER — Inpatient Hospital Stay (HOSPITAL_BASED_OUTPATIENT_CLINIC_OR_DEPARTMENT_OTHER): Payer: Medicare (Managed Care) | Admitting: Hematology and Oncology

## 2020-09-21 ENCOUNTER — Other Ambulatory Visit: Payer: Self-pay

## 2020-09-21 ENCOUNTER — Encounter: Payer: Self-pay | Admitting: Hematology and Oncology

## 2020-09-21 ENCOUNTER — Inpatient Hospital Stay: Payer: Medicare (Managed Care) | Attending: Hematology and Oncology

## 2020-09-21 VITALS — BP 128/70 | HR 76 | Temp 97.3°F | Resp 19 | Ht 71.0 in | Wt 140.0 lb

## 2020-09-21 DIAGNOSIS — J449 Chronic obstructive pulmonary disease, unspecified: Secondary | ICD-10-CM | POA: Diagnosis not present

## 2020-09-21 DIAGNOSIS — C61 Malignant neoplasm of prostate: Secondary | ICD-10-CM | POA: Insufficient documentation

## 2020-09-21 DIAGNOSIS — C7951 Secondary malignant neoplasm of bone: Secondary | ICD-10-CM | POA: Diagnosis present

## 2020-09-21 DIAGNOSIS — Z833 Family history of diabetes mellitus: Secondary | ICD-10-CM | POA: Insufficient documentation

## 2020-09-21 DIAGNOSIS — Z79899 Other long term (current) drug therapy: Secondary | ICD-10-CM | POA: Diagnosis not present

## 2020-09-21 DIAGNOSIS — K5900 Constipation, unspecified: Secondary | ICD-10-CM | POA: Insufficient documentation

## 2020-09-21 DIAGNOSIS — Z888 Allergy status to other drugs, medicaments and biological substances status: Secondary | ICD-10-CM | POA: Insufficient documentation

## 2020-09-21 DIAGNOSIS — Z923 Personal history of irradiation: Secondary | ICD-10-CM | POA: Insufficient documentation

## 2020-09-21 DIAGNOSIS — M79606 Pain in leg, unspecified: Secondary | ICD-10-CM | POA: Diagnosis not present

## 2020-09-21 DIAGNOSIS — M549 Dorsalgia, unspecified: Secondary | ICD-10-CM | POA: Insufficient documentation

## 2020-09-21 DIAGNOSIS — Z836 Family history of other diseases of the respiratory system: Secondary | ICD-10-CM | POA: Diagnosis not present

## 2020-09-21 DIAGNOSIS — Z7952 Long term (current) use of systemic steroids: Secondary | ICD-10-CM | POA: Insufficient documentation

## 2020-09-21 DIAGNOSIS — F17210 Nicotine dependence, cigarettes, uncomplicated: Secondary | ICD-10-CM | POA: Diagnosis not present

## 2020-09-21 DIAGNOSIS — I10 Essential (primary) hypertension: Secondary | ICD-10-CM | POA: Insufficient documentation

## 2020-09-21 DIAGNOSIS — N529 Male erectile dysfunction, unspecified: Secondary | ICD-10-CM | POA: Insufficient documentation

## 2020-09-21 LAB — CBC WITH DIFFERENTIAL (CANCER CENTER ONLY)
Abs Immature Granulocytes: 0.03 10*3/uL (ref 0.00–0.07)
Basophils Absolute: 0 10*3/uL (ref 0.0–0.1)
Basophils Relative: 0 %
Eosinophils Absolute: 0 10*3/uL (ref 0.0–0.5)
Eosinophils Relative: 1 %
HCT: 30.2 % — ABNORMAL LOW (ref 39.0–52.0)
Hemoglobin: 10 g/dL — ABNORMAL LOW (ref 13.0–17.0)
Immature Granulocytes: 1 %
Lymphocytes Relative: 14 %
Lymphs Abs: 0.8 10*3/uL (ref 0.7–4.0)
MCH: 27.2 pg (ref 26.0–34.0)
MCHC: 33.1 g/dL (ref 30.0–36.0)
MCV: 82.1 fL (ref 80.0–100.0)
Monocytes Absolute: 0.5 10*3/uL (ref 0.1–1.0)
Monocytes Relative: 9 %
Neutro Abs: 4.3 10*3/uL (ref 1.7–7.7)
Neutrophils Relative %: 75 %
Platelet Count: 299 10*3/uL (ref 150–400)
RBC: 3.68 MIL/uL — ABNORMAL LOW (ref 4.22–5.81)
RDW: 18.6 % — ABNORMAL HIGH (ref 11.5–15.5)
WBC Count: 5.6 10*3/uL (ref 4.0–10.5)
nRBC: 0 % (ref 0.0–0.2)

## 2020-09-21 LAB — CMP (CANCER CENTER ONLY)
ALT: 9 U/L (ref 0–44)
AST: 10 U/L — ABNORMAL LOW (ref 15–41)
Albumin: 3.6 g/dL (ref 3.5–5.0)
Alkaline Phosphatase: 75 U/L (ref 38–126)
Anion gap: 7 (ref 5–15)
BUN: 9 mg/dL (ref 8–23)
CO2: 28 mmol/L (ref 22–32)
Calcium: 9.3 mg/dL (ref 8.9–10.3)
Chloride: 108 mmol/L (ref 98–111)
Creatinine: 0.76 mg/dL (ref 0.61–1.24)
GFR, Estimated: >60 mL/min (ref >60)
Glucose, Bld: 104 mg/dL — ABNORMAL HIGH (ref 70–99)
Potassium: 4.5 mmol/L (ref 3.5–5.1)
Sodium: 143 mmol/L (ref 135–145)
Total Bilirubin: 0.3 mg/dL (ref 0.3–1.2)
Total Protein: 6.9 g/dL (ref 6.5–8.1)

## 2020-09-21 NOTE — Progress Notes (Signed)
[place] [phone]   [phone]  PROGRESS NOTE  Patient Care Team: Dustin Hansen, MD as PCP - General (Internal Medicine) Dustin Rue, RN Nurse Navigator as Registered Nurse (Medical Oncology)  Hematological/Oncological History # Metastatic Castrate Sensitive Prostate Cancer, Metastatic to Bone 1) 07/13/2019: Abdomen/Pelvis CT extensive lytic changes with superimposed pathological fractures. Lytic change noted in the right iliac bone and new lucencies in the right T10 vertebrae.  2) 07/19/2019: biopsy of sacral mass shows metastatic prostatic adenocarcinoma, Gleason 4+4.  3) 07/2019: reportedly received Zometa 4g IV and eligard 22.5. Started on Casodex.  4) 6/10-6/16/2021: received palliative radiation to the sacrum at [place]. Received 2000cGy over 6 days 5) Moved to [place]. Lost to follow up from [facility] in [place], [place]. 6) 11/05/2019: establish care with Dr. Lorenso Alvarez  7) 11/16/2019: Administered Zometa 66m IV and Lupron 22.5 mg 8) 12/08/2019: started abiraterone 100283mPO daily  9) 03/09/2020: Administered Zometa 83m81mV and Lupron 22.5 mg 10) 06/01/2020: Administered Zometa 83mg68m and Lupron 22.5 mg  Interval History:  MichDAI [id number]. male with medical history significant for metastatic prostate cancer who presents for a follow up visit. The patient's last visit was on 07/31/2020. In the interim since the last visit he has had no major changes in health.  On exam today Mr. GrifCarpenterorts he continues to struggle with back pain.  He notes that he has met with neurosurgery who is planning on offering him a trial with a spinal stimulator.  He notes that he has pain down his legs and still in his spine.  The pain medications do help take the edge off some.  He has had no new symptoms in the interim since our last visit. He otherwise denies any fevers, chills, sweats, nausea, vomiting or diarrhea.  He notes he is tolerating  abiraterone therapy well and has no questions or concerns regarding his care.  A full 10 point ROS is listed below.  MEDICAL HISTORY:  Past Medical History:  Diagnosis Date   Asthma    COPD (chronic obstructive pulmonary disease) (HCC)    Hypertension    Prostate cancer (HCC)[place]  SURGICAL HISTORY: Past Surgical History:  Procedure Laterality Date   WRIST SURGERY Left 2017    SOCIAL HISTORY: Social History   Socioeconomic History   Marital status: Legally Separated    Spouse name: Not on file   Number of children: Not on file   Years of education: Not on file   Highest education level: Not on file  Occupational History   Not on file  Tobacco Use   Smoking status: Every Day    Packs/day: 1.00    Types: Cigarettes   Smokeless tobacco: Never   Tobacco comments:    almost 1 pkd. Wants patches   Vaping Use   Vaping Use: Never used  Substance and Sexual Activity   Alcohol use: Yes    Comment: 1-2 drinks per week.   Drug use: Never   Sexual activity: Not Currently  Other Topics Concern   Not on file  Social History Narrative   Not on file   Social Determinants of Health   Financial Resource Strain: Not on file  Food Insecurity: Not on file  Transportation Needs: Not on file  Physical Activity: Not on file  Stress: Not on file  Social Connections: Not on file  Intimate Partner Violence: Not on file    FAMILY HISTORY: Family History  Problem  Relation Age of Onset   COPD Father    Diabetes Sister    Diabetes Brother    Breast cancer Neg Hx    Prostate cancer Neg Hx    Colon cancer Neg Hx    Pancreatic cancer Neg Hx     ALLERGIES:  is allergic to ace inhibitors and lisinopril.  MEDICATIONS:  Current Outpatient Medications  Medication Sig Dispense Refill   tamsulosin (FLOMAX) 0.4 MG CAPS capsule Take 1 capsule (0.4 mg total) by mouth in the morning and at bedtime. 180 capsule 0   abiraterone acetate (ZYTIGA) 250 MG tablet TAKE 4 TABLETS BY MOUTH DAILY.  TAKE ON AN EMPTY STOMACH 1 HOUR BEFORE OR 2 HOURS AFTER A MEAL 120 tablet 2   albuterol (VENTOLIN HFA) 108 (90 Base) MCG/ACT inhaler Inhale 2 puffs into the lungs every 6 (six) hours as needed for wheezing or shortness of breath. 8.5 g 3   amLODipine (NORVASC) 5 MG tablet Take 1 tablet (5 mg total) by mouth daily. 30 tablet 3   calcium-vitamin D (OSCAL WITH D) 500-200 MG-UNIT tablet Take 1 tablet by mouth daily with breakfast. 90 tablet 3   finasteride (PROSCAR) 5 MG tablet Take 1 tablet (5 mg total) by mouth daily. 30 tablet 2   gabapentin (NEURONTIN) 300 MG capsule Take 2 capsules (600 mg total) by mouth 2 (two) times daily. 120 capsule 2   nicotine polacrilex (NICORETTE) 2 MG gum Take 1 each (2 mg total) by mouth as needed for smoking cessation (Chew up to 24 pieces in a day). (Patient not taking: Reported on 08/10/2020) 100 tablet 1   oxyCODONE (OXY IR/ROXICODONE) 5 MG immediate release tablet TAKE 1 TABLET BY MOUTH EVERY 4 HOURS AS NEEDED 180 tablet 0   oxyCODONE (OXYCONTIN) 40 mg 12 hr tablet Take 1 tablet (40 mg total) by mouth every 12 (twelve) hours. 60 tablet 0   pantoprazole (PROTONIX) 40 MG tablet Take 1 tablet (40 mg total) by mouth daily. 30 tablet 3   predniSONE (DELTASONE) 5 MG tablet Take 1 tablet (5 mg total) by mouth daily with breakfast. (do not take prior to starting Abiraterone) 90 tablet 1   senna-docusate (SENOKOT-S) 8.6-50 MG tablet TAKE 1 TABLET BY MOUTH 2 TIMES DAILY 60 tablet 1   sildenafil (VIAGRA) 100 MG tablet Take 1 tablet (100 mg total) by mouth daily as needed for erectile dysfunction. 30 tablet 0   terbinafine (LAMISIL) 250 MG tablet Take 1 tablet (250 mg total) by mouth daily. 84 tablet 0   traZODone (DESYREL) 50 MG tablet Take 1 tablet (50 mg total) by mouth at bedtime. 90 tablet 1   TRELEGY ELLIPTA 100-62.5-25 MCG/INH AEPB Inhale 2 puffs into the lungs daily. 180 each 0   No current facility-administered medications for this visit.    REVIEW OF SYSTEMS:    Constitutional: ( - ) fevers, ( - )  chills , ( - ) night sweats Eyes: ( - ) blurriness of vision, ( - ) double vision, ( - ) watery eyes Ears, nose, mouth, throat, and face: ( - ) mucositis, ( - ) sore throat Respiratory: ( - ) cough, ( - ) dyspnea, ( - ) wheezes Cardiovascular: ( - ) palpitation, ( - ) chest discomfort, ( - ) lower extremity swelling Gastrointestinal:  ( - ) nausea, ( - ) heartburn, ( - ) change in bowel habits Skin: ( - ) abnormal skin rashes Lymphatics: ( - ) new lymphadenopathy, ( - ) easy bruising Neurological: ( - )  numbness, ( - ) tingling, ( - ) new weaknesses Behavioral/Psych: ( - ) mood change, ( - ) new changes  All other systems were reviewed with the patient and are negative.  PHYSICAL EXAMINATION:  Vitals:   09/21/20 1421  BP: 128/70  Pulse: 76  Resp: 19  Temp: (!) 97.3 F (36.3 C)  SpO2: 97%   Filed Weights   09/21/20 1421  Weight: 140 lb (63.5 kg)    GENERAL: well appearing elderly African American male in NAD. SKIN: skin color, texture, turgor are normal, no rashes or significant lesions EYES: conjunctiva are pink and non-injected, sclera clear LUNGS: clear to auscultation and percussion with normal breathing effort HEART: regular rate & rhythm and no murmurs and no lower extremity edema Musculoskeletal: no cyanosis of digits and no clubbing  PSYCH: alert & oriented x 3, fluent speech NEURO: no focal motor/sensory deficits  LABORATORY DATA:  I have reviewed the data as listed CBC Latest Ref Rng & Units 09/21/2020 07/31/2020 07/03/2020  WBC 4.0 - 10.5 K/uL 5.6 4.4 4.4  Hemoglobin 13.0 - 17.0 g/dL 10.0(L) 11.2(L) 10.8(L)  Hematocrit 39.0 - 52.0 % 30.2(L) 33.9(L) 32.4(L)  Platelets 150 - 400 K/uL 299 273 321    CMP Latest Ref Rng & Units 09/21/2020 07/31/2020 07/24/2020  Glucose 70 - 99 mg/dL 104(H) 117(H) 102(H)  BUN 8 - 23 mg/dL _0 Creatinine 0.61 - 1.24 mg/dL 0.76 0.83 0.76  Sodium 135 - 145 mmol/L 143 136 138  Potassium 3.5 -  5.1 mmol/L 4.5 3.9 4.3  Chloride 98 - 111 mmol/L 108 100 98  CO2 22 - 32 mmol/L _1 Calcium 8.9 - 10.3 mg/dL 9.3 9.1 9.9  Total Protein 6.5 - 8.1 g/dL 6.9 7.5 7.7  Total Bilirubin 0.3 - 1.2 mg/dL 0.3 0.5 0.3  Alkaline Phos 38 - 126 U/L 75 64 78  AST 15 - 41 U/L 10(L) 14(L) 9  ALT 0 - 44 U/L _2 RADIOGRAPHIC STUDIES: No results found.  ASSESSMENT & PLAN ISSACC MERLO 65 y.o. male with medical history significant for metastatic prostate cancer who presents for a follow up visit.  Mr. Waibel has excessively transitioned off the Casodex and has received his first dosages of Zometa and Lupron.  His PSA continues to trend downward and is testosterone is at castrate level.  Given this we started abiraterone therapy 1000 mg p.o. daily with 5 mg of prednisone.   The major symptom the patient has been experiencing has been pain.  His spinal sacral lesion has been poorly controlled on oxycodone therapy.  He did previously receive radiation therapy in June 2021, but due to continued excruciating pain we asked for him to be reexamined by radiation oncology to see if there is any further intervention they can offer at this time. He has had further palliative radiation, though this has not relieved his pain. Continue OxyContin to 30 mg twice daily (per his request, issues with constipation at higher doses) and additionally continue his as needed fast acting oxycodone. We will continue gabapentin at 346m BID.  Due to the need for monitoring of abiraterone we will plan to have him back in 8 weeks for clinic visit.  PSA 07/13/2019: PSA 819 08/27/2019: 226 11/05/2019: 44.8 12/08/2019: 27.3 12/27/2019: 21.2  02/24/2020: 12.1 03/22/2020: PSA 8.8 05/18/2020: PSA 4.1 07/31/2020: PSA 2.8  # Metastatic Castrate Sensitive Prostate Cancer, Metastatic to Bone --findings are most consistent with metastatic adenocarcinoma of the prostate.  --  testosterone at last visit was <3, PSA down to 2.8 ( from 819 at  diagnosis)  --Administered Zometa 80m IV and Lupron 22.5 mg on 06/01/2020. Next due now (3 month intervals).  --continue abiraterone 10057mwith prednisone 13m63mO daily. Assure he is compliant with the full dose and steroids.  --patient due for a repeat CT scan and NM bone scan in May 2022. Repeat q 3 months. We are currently having global supply issues with contrast material. Scans delayed. Order placed again today. --RTC in 8 weeks for continued monitoring on Abiraterone therapy  # Erectile Dysfunction --patient trialed on cialis and Viagra with sub-optimal results --patient requesting "something stronger" --referred to urology for assistance in managing his ED.    #Symptom Management --patient requested a refill of viagra due to issues with ED  --gabapentin 600 mg BID to help with pain.  --continue oxycodone 5-35m70mH PRN for pain control. --continue oxycontin 30mg513mH for basal pain control --established care with Radiation Oncology to help with painful bone lesions. Radiation therapy completed 02/01/2021.  --seen by neurosurgery, provided injections with no relief. Currently planning a spinal stimulator  No orders of the defined types were placed in this encounter.   All questions were answered. The patient knows to call the clinic with any problems, questions or concerns.  A total of more than 30 minutes were spent on this encounter and over half of that time was spent on counseling and coordination of care as outlined above.   Dustin Kenneth Ledell PeoplesDepartment of Hematology/Oncology [facility] Pce: [phone]: [phone]: [email or web address]  09/21/2020 3:03 PM

## 2020-09-22 ENCOUNTER — Telehealth: Payer: Self-pay | Admitting: Hematology and Oncology

## 2020-09-22 NOTE — Telephone Encounter (Signed)
Sch per 8/11 sch msg, pt son aware.

## 2020-09-26 ENCOUNTER — Inpatient Hospital Stay: Payer: Medicare (Managed Care)

## 2020-09-27 ENCOUNTER — Other Ambulatory Visit: Payer: Self-pay | Admitting: Hematology and Oncology

## 2020-10-02 ENCOUNTER — Other Ambulatory Visit (HOSPITAL_COMMUNITY): Payer: Self-pay

## 2020-10-03 ENCOUNTER — Encounter: Payer: Self-pay | Admitting: Hematology and Oncology

## 2020-10-05 ENCOUNTER — Other Ambulatory Visit: Payer: Self-pay | Admitting: Hematology and Oncology

## 2020-10-05 ENCOUNTER — Other Ambulatory Visit (HOSPITAL_COMMUNITY): Payer: Self-pay

## 2020-10-05 MED ORDER — OXYCODONE HCL 5 MG PO TABS
ORAL_TABLET | ORAL | 0 refills | Status: DC | PRN
  Filled 2020-10-05: qty 180, 30d supply, fill #0

## 2020-10-06 ENCOUNTER — Encounter (HOSPITAL_COMMUNITY)
Admission: RE | Admit: 2020-10-06 | Discharge: 2020-10-06 | Disposition: A | Discharge status: Home / Self Care | Payer: Medicare (Managed Care) | Source: Ambulatory Visit | Attending: Hematology and Oncology | Admitting: Hematology and Oncology

## 2020-10-06 ENCOUNTER — Other Ambulatory Visit (HOSPITAL_COMMUNITY): Payer: Self-pay

## 2020-10-06 ENCOUNTER — Other Ambulatory Visit: Payer: Self-pay

## 2020-10-06 ENCOUNTER — Ambulatory Visit (HOSPITAL_COMMUNITY)
Admission: RE | Admit: 2020-10-06 | Discharge: 2020-10-06 | Disposition: A | Discharge status: Home / Self Care | Payer: Medicare (Managed Care) | Source: Ambulatory Visit | Attending: Hematology and Oncology | Admitting: Hematology and Oncology

## 2020-10-06 DIAGNOSIS — C61 Malignant neoplasm of prostate: Secondary | ICD-10-CM | POA: Diagnosis not present

## 2020-10-06 DIAGNOSIS — C7951 Secondary malignant neoplasm of bone: Secondary | ICD-10-CM | POA: Diagnosis present

## 2020-10-06 MED ORDER — IOHEXOL 350 MG/ML SOLN
100.0000 mL | Freq: Once | INTRAVENOUS | Status: AC | PRN
  Administered 2020-10-06: 80 mL via INTRAVENOUS

## 2020-10-06 MED ORDER — TECHNETIUM TC 99M MEDRONATE IV KIT
20.0000 | PACK | Freq: Once | INTRAVENOUS | Status: AC | PRN
  Administered 2020-10-06: 20 via INTRAVENOUS

## 2020-10-09 ENCOUNTER — Telehealth: Payer: Self-pay | Admitting: *Deleted

## 2020-10-09 ENCOUNTER — Other Ambulatory Visit: Payer: Self-pay | Admitting: Hematology and Oncology

## 2020-10-09 DIAGNOSIS — M79605 Pain in left leg: Secondary | ICD-10-CM

## 2020-10-09 NOTE — Telephone Encounter (Signed)
TCT patient's adult child, [name] regarding results of recent CT scan.  Spoke with her and advised that the CT scan and the Bone scan did not reveal any new sites of metastasis. Advised that the CT scan did reveal occlusion in his right femoral artery. Advised that Dr. Lorenso Courier has sent a vascular surgery referral to address this issue. Provided the name and # for the physician-Dr. Monica Martinez. [name] voiced understanding. She will call her dad and let him know what is going on.

## 2020-10-10 ENCOUNTER — Other Ambulatory Visit: Payer: Self-pay

## 2020-10-10 DIAGNOSIS — M79604 Pain in right leg: Secondary | ICD-10-CM

## 2020-10-13 ENCOUNTER — Other Ambulatory Visit: Payer: Self-pay

## 2020-10-13 ENCOUNTER — Ambulatory Visit (HOSPITAL_COMMUNITY)
Admission: RE | Admit: 2020-10-13 | Discharge: 2020-10-13 | Disposition: A | Discharge status: Home / Self Care | Payer: Medicare (Managed Care) | Source: Ambulatory Visit | Attending: Vascular Surgery | Admitting: Vascular Surgery

## 2020-10-13 DIAGNOSIS — M79604 Pain in right leg: Secondary | ICD-10-CM | POA: Diagnosis not present

## 2020-10-13 DIAGNOSIS — M79605 Pain in left leg: Secondary | ICD-10-CM | POA: Diagnosis not present

## 2020-10-17 ENCOUNTER — Other Ambulatory Visit: Payer: Self-pay | Admitting: Hematology and Oncology

## 2020-10-17 ENCOUNTER — Other Ambulatory Visit: Payer: Self-pay | Admitting: Internal Medicine

## 2020-10-17 ENCOUNTER — Other Ambulatory Visit (HOSPITAL_COMMUNITY): Payer: Self-pay

## 2020-10-17 DIAGNOSIS — J449 Chronic obstructive pulmonary disease, unspecified: Secondary | ICD-10-CM

## 2020-10-17 MED ORDER — OXYCODONE HCL ER 40 MG PO T12A
40.0000 mg | EXTENDED_RELEASE_TABLET | Freq: Two times a day (BID) | ORAL | 0 refills | Status: DC
  Filled 2020-10-17: qty 60, 30d supply, fill #0

## 2020-10-24 ENCOUNTER — Encounter: Payer: Self-pay | Admitting: Vascular Surgery

## 2020-10-24 ENCOUNTER — Other Ambulatory Visit: Payer: Self-pay

## 2020-10-24 ENCOUNTER — Ambulatory Visit (INDEPENDENT_AMBULATORY_CARE_PROVIDER_SITE_OTHER): Payer: Medicare (Managed Care) | Admitting: Vascular Surgery

## 2020-10-24 DIAGNOSIS — I739 Peripheral vascular disease, unspecified: Secondary | ICD-10-CM

## 2020-10-24 NOTE — Progress Notes (Signed)
Patient name: Dustin Alvarez MRN: [id number] DOB: 1956/01/12 Sex: male  REASON FOR CONSULT: Evaluate leg pain and possible arterial insufficiency  HPI: Dustin Alvarez is a 65 y.o. male, with history of metastatic prostate cancer and COPD who presents for evaluation of leg pain.  He is referred by Dr. Lorenso Courier with hem onc.  Patient states he has had pain in both lower extremities but much worse in the left leg.  States he has pain from the thigh all the way down into the foot.  He has some numbness in the foot as well.  He feels this has been ongoing since last year when he moved to [place].  He is here with his daughter who states her dad always complains of left leg pain.  He had a CT on 10/09/2020 with interval occlusion of the left SFA but patent profunda.  He is ambulatory.  Does smoke.  Past Medical History:  Diagnosis Date   Asthma    COPD (chronic obstructive pulmonary disease) (HCC)    Hypertension    Prostate cancer [facility])     Past Surgical History:  Procedure Laterality Date   WRIST SURGERY Left 2017    Family History  Problem Relation Age of Onset   COPD Father    Diabetes Sister    Diabetes Brother    Breast cancer Neg Hx    Prostate cancer Neg Hx    Colon cancer Neg Hx    Pancreatic cancer Neg Hx     SOCIAL HISTORY: Social History   Socioeconomic History   Marital status: Legally Separated    Spouse name: Not on file   Number of children: Not on file   Years of education: Not on file   Highest education level: Not on file  Occupational History   Not on file  Tobacco Use   Smoking status: Every Day    Packs/day: 1.00    Types: Cigarettes   Smokeless tobacco: Never   Tobacco comments:    almost 1 pkd. Wants patches   Vaping Use   Vaping Use: Never used  Substance and Sexual Activity   Alcohol use: Yes    Comment: 1-2 drinks per week.   Drug use: Never   Sexual activity: Not Currently  Other Topics Concern   Not on file  Social History  Narrative   Not on file   Social Determinants of Health   Financial Resource Strain: Not on file  Food Insecurity: Not on file  Transportation Needs: Not on file  Physical Activity: Not on file  Stress: Not on file  Social Connections: Not on file  Intimate Partner Violence: Not on file    Allergies  Allergen Reactions   Ace Inhibitors Swelling    lisinopril   Lisinopril     Current Outpatient Medications  Medication Sig Dispense Refill   abiraterone acetate (ZYTIGA) 250 MG tablet TAKE 4 TABLETS BY MOUTH DAILY. TAKE ON AN EMPTY STOMACH 1 HOUR BEFORE OR 2 HOURS AFTER A MEAL 120 tablet 2   albuterol (VENTOLIN HFA) 108 (90 Base) MCG/ACT inhaler Inhale 2 puffs into the lungs every 6 (six) hours as needed for wheezing or shortness of breath. 8.5 g 3   amLODipine (NORVASC) 5 MG tablet Take 1 tablet (5 mg total) by mouth daily. 30 tablet 3   calcium-vitamin D (OSCAL WITH D) 500-200 MG-UNIT tablet Take 1 tablet by mouth daily with breakfast. 90 tablet 3   finasteride (PROSCAR) 5 MG tablet Take  1 tablet (5 mg total) by mouth daily. 30 tablet 2   gabapentin (NEURONTIN) 300 MG capsule Take 2 capsules (600 mg total) by mouth 2 (two) times daily. 120 capsule 2   oxyCODONE (OXY IR/ROXICODONE) 5 MG immediate release tablet TAKE 1 TABLET BY MOUTH EVERY 4 HOURS AS NEEDED 180 tablet 0   oxyCODONE (OXYCONTIN) 40 mg 12 hr tablet Take 1 tablet (40 mg total) by mouth every 12 (twelve) hours. 60 tablet 0   pantoprazole (PROTONIX) 40 MG tablet Take 1 tablet (40 mg total) by mouth daily. 30 tablet 3   predniSONE (DELTASONE) 5 MG tablet Take 1 tablet (5 mg total) by mouth daily with breakfast. (do not take prior to starting Abiraterone) 90 tablet 1   senna-docusate (SENOKOT-S) 8.6-50 MG tablet TAKE 1 TABLET BY MOUTH 2 TIMES DAILY 60 tablet 1   sildenafil (VIAGRA) 100 MG tablet Take 1 tablet (100 mg total) by mouth daily as needed for erectile dysfunction. 30 tablet 0   tamsulosin (FLOMAX) 0.4 MG CAPS  capsule Take 1 capsule (0.4 mg total) by mouth in the morning and at bedtime. 180 capsule 0   traZODone (DESYREL) 50 MG tablet Take 1 tablet (50 mg total) by mouth at bedtime. 90 tablet 1   TRELEGY ELLIPTA 100-62.5-25 MCG/INH AEPB Inhale 2 puffs into the lungs daily. 180 each 0   nicotine polacrilex (NICORETTE) 2 MG gum Take 1 each (2 mg total) by mouth as needed for smoking cessation (Chew up to 24 pieces in a day). (Patient not taking: Reported on 10/24/2020) 100 tablet 1   oxyCODONE (OXY IR/ROXICODONE) 5 MG immediate release tablet TAKE 1 TABLET BY MOUTH EVERY 4 HOURS AS NEEDED (Patient not taking: Reported on 10/24/2020) 180 tablet 0   No current facility-administered medications for this visit.    REVIEW OF SYSTEMS:  '[X]'$  denotes positive finding, '[ ]'$  denotes negative finding Cardiac  Comments:  Chest pain or chest pressure:    Shortness of breath upon exertion:    Short of breath when lying flat:    Irregular heart rhythm:        Vascular    Pain in calf, thigh, or hip brought on by ambulation:    Pain in feet at night that wakes you up from your sleep:  x Left  Blood clot in your veins:    Leg swelling:         Pulmonary    Oxygen at home:    Productive cough:     Wheezing:         Neurologic    Sudden weakness in arms or legs:     Sudden numbness in arms or legs:     Sudden onset of difficulty speaking or slurred speech:    Temporary loss of vision in one eye:     Problems with dizziness:         Gastrointestinal    Blood in stool:     Vomited blood:         Genitourinary    Burning when urinating:     Blood in urine:        Psychiatric    Major depression:         Hematologic    Bleeding problems:    Problems with blood clotting too easily:        Skin    Rashes or ulcers:        Constitutional    Fever or chills:  PHYSICAL EXAM: Vitals:   10/24/20 1219  BP: 118/73  Pulse: 97  Resp: 18  Temp: 98.2 F (36.8 C)  TempSrc: Temporal  SpO2: 90%   Weight: 140 lb (63.5 kg)  Height: '5\' 11"'$  (1.803 m)    GENERAL: The patient is a well-nourished male, in no acute distress. The vital signs are documented above. CARDIAC: There is a regular rate and rhythm.  VASCULAR:  Palpable femoral pulses bilaterally Right DP palpable Left pedal pulses nonpalpable No tissue loss PULMONARY: No respiratory distress. ABDOMEN: Soft and non-tender. MUSCULOSKELETAL: There are no major deformities or cyanosis. NEUROLOGIC: No focal weakness or paresthesias are detected. SKIN: There are no ulcers or rashes noted. PSYCHIATRIC: The patient has a normal affect.  DATA:   ABIs today are 0.99 on the right triphasic and 0.46 on the left monophasic  CT abdomen pelvis 10/09/20 suggest proximal left SFA occlusion  Assessment/Plan:  65 year old male presents for evaluation of left leg pain in the setting of metastatic prostate cancer.  I have reviewed his ABIs which are severely reduced on the left 0.46 and monophasic.  Appears to have a flush SFA occlusion on the left by recent CT. I SUSPECT SOME COMPONENT OF ARTERIAL INSUFFICIENCY EXPLAINS HIS LEFT LEG PAIN.  HE HAS NORMAL PERFUSION ON THE RIGHT.  I have recommended aortogram with lower extremity arteriogram with attention on the left leg.  Discussed the likely will require bypass at a later time but need arteriogram to determine targets  Risk benefits discussed.  We will get him scheduled for later this week.   Marty Heck, MD [facility] [place] Office: [phone]

## 2020-10-26 ENCOUNTER — Ambulatory Visit (HOSPITAL_BASED_OUTPATIENT_CLINIC_OR_DEPARTMENT_OTHER)
Admission: RE | Admit: 2020-10-26 | Discharge: 2020-10-26 | Disposition: A | Discharge status: Home / Self Care | Payer: Medicare (Managed Care) | Source: Ambulatory Visit | Attending: Vascular Surgery | Admitting: Vascular Surgery

## 2020-10-26 ENCOUNTER — Ambulatory Visit (HOSPITAL_COMMUNITY)
Admission: RE | Admit: 2020-10-26 | Discharge: 2020-10-26 | Disposition: A | Discharge status: Home / Self Care | Payer: Medicare (Managed Care) | Source: Ambulatory Visit | Attending: Vascular Surgery | Admitting: Vascular Surgery

## 2020-10-26 ENCOUNTER — Encounter (HOSPITAL_COMMUNITY)
Admission: RE | Disposition: A | Discharge status: Home / Self Care | Payer: Self-pay | Source: Ambulatory Visit | Attending: Vascular Surgery

## 2020-10-26 DIAGNOSIS — F17210 Nicotine dependence, cigarettes, uncomplicated: Secondary | ICD-10-CM | POA: Insufficient documentation

## 2020-10-26 DIAGNOSIS — M79605 Pain in left leg: Secondary | ICD-10-CM

## 2020-10-26 DIAGNOSIS — J449 Chronic obstructive pulmonary disease, unspecified: Secondary | ICD-10-CM | POA: Insufficient documentation

## 2020-10-26 DIAGNOSIS — Z79899 Other long term (current) drug therapy: Secondary | ICD-10-CM | POA: Insufficient documentation

## 2020-10-26 DIAGNOSIS — I70222 Atherosclerosis of native arteries of extremities with rest pain, left leg: Secondary | ICD-10-CM | POA: Insufficient documentation

## 2020-10-26 DIAGNOSIS — I70201 Unspecified atherosclerosis of native arteries of extremities, right leg: Secondary | ICD-10-CM

## 2020-10-26 DIAGNOSIS — Z8546 Personal history of malignant neoplasm of prostate: Secondary | ICD-10-CM | POA: Diagnosis not present

## 2020-10-26 DIAGNOSIS — Z888 Allergy status to other drugs, medicaments and biological substances status: Secondary | ICD-10-CM | POA: Diagnosis not present

## 2020-10-26 HISTORY — PX: ABDOMINAL AORTOGRAM W/LOWER EXTREMITY: CATH118223

## 2020-10-26 LAB — POCT I-STAT, CHEM 8
BUN: 12 mg/dL (ref 8–23)
Calcium, Ion: 1.18 mmol/L (ref 1.15–1.40)
Chloride: 98 mmol/L (ref 98–111)
Creatinine, Ser: 0.6 mg/dL — ABNORMAL LOW (ref 0.61–1.24)
Glucose, Bld: 97 mg/dL (ref 70–99)
HCT: 34 % — ABNORMAL LOW (ref 39.0–52.0)
Hemoglobin: 11.6 g/dL — ABNORMAL LOW (ref 13.0–17.0)
Potassium: 3.1 mmol/L — ABNORMAL LOW (ref 3.5–5.1)
Sodium: 140 mmol/L (ref 135–145)
TCO2: 32 mmol/L (ref 22–32)

## 2020-10-26 SURGERY — ABDOMINAL AORTOGRAM W/LOWER EXTREMITY
Anesthesia: LOCAL

## 2020-10-26 MED ORDER — FENTANYL CITRATE (PF) 100 MCG/2ML IJ SOLN
INTRAMUSCULAR | Status: AC
  Filled 2020-10-26: qty 2

## 2020-10-26 MED ORDER — HEPARIN (PORCINE) IN NACL 1000-0.9 UT/500ML-% IV SOLN
INTRAVENOUS | Status: AC
  Filled 2020-10-26: qty 1000

## 2020-10-26 MED ORDER — ACETAMINOPHEN 325 MG PO TABS: 650.0000 mg | ORAL_TABLET | ORAL | Status: DC | PRN

## 2020-10-26 MED ORDER — MIDAZOLAM HCL 2 MG/2ML IJ SOLN
INTRAMUSCULAR | Status: DC | PRN
  Administered 2020-10-26: 1 mg via INTRAVENOUS

## 2020-10-26 MED ORDER — SODIUM CHLORIDE 0.9 % IV SOLN: INTRAVENOUS | Status: DC

## 2020-10-26 MED ORDER — IODIXANOL 320 MG/ML IV SOLN
INTRAVENOUS | Status: DC | PRN
  Administered 2020-10-26: 100 mL

## 2020-10-26 MED ORDER — LABETALOL HCL 5 MG/ML IV SOLN: 10.0000 mg | INTRAVENOUS | Status: DC | PRN

## 2020-10-26 MED ORDER — MIDAZOLAM HCL 2 MG/2ML IJ SOLN
INTRAMUSCULAR | Status: AC
  Filled 2020-10-26: qty 2

## 2020-10-26 MED ORDER — ONDANSETRON HCL 4 MG/2ML IJ SOLN: 4.0000 mg | Freq: Four times a day (QID) | INTRAMUSCULAR | Status: DC | PRN

## 2020-10-26 MED ORDER — LIDOCAINE HCL (PF) 1 % IJ SOLN
INTRAMUSCULAR | Status: DC | PRN
  Administered 2020-10-26: 18 mL

## 2020-10-26 MED ORDER — SODIUM CHLORIDE 0.9% FLUSH: 3.0000 mL | Freq: Two times a day (BID) | INTRAVENOUS | Status: DC

## 2020-10-26 MED ORDER — FENTANYL CITRATE (PF) 100 MCG/2ML IJ SOLN
INTRAMUSCULAR | Status: DC | PRN
  Administered 2020-10-26: 50 ug via INTRAVENOUS

## 2020-10-26 MED ORDER — SODIUM CHLORIDE 0.9 % IV SOLN: 250.0000 mL | INTRAVENOUS | Status: DC | PRN

## 2020-10-26 MED ORDER — SODIUM CHLORIDE 0.9% FLUSH: 3.0000 mL | INTRAVENOUS | Status: DC | PRN

## 2020-10-26 MED ORDER — HYDRALAZINE HCL 20 MG/ML IJ SOLN: 5.0000 mg | INTRAMUSCULAR | Status: DC | PRN

## 2020-10-26 MED ORDER — LIDOCAINE HCL (PF) 1 % IJ SOLN
INTRAMUSCULAR | Status: AC
  Filled 2020-10-26: qty 30

## 2020-10-26 MED ORDER — SODIUM CHLORIDE 0.9 % WEIGHT BASED INFUSION: 1.0000 mL/kg/h | INTRAVENOUS | Status: DC

## 2020-10-26 MED ORDER — HEPARIN (PORCINE) IN NACL 1000-0.9 UT/500ML-% IV SOLN
INTRAVENOUS | Status: DC | PRN
  Administered 2020-10-26 (×2): 500 mL

## 2020-10-26 SURGICAL SUPPLY — 10 items
CATH ANGIO 5F PIGTAIL 65CM (CATHETERS) ×1 IMPLANT
DEVICE CLOSURE MYNXGRIP 5F (Vascular Products) IMPLANT
KIT MICROPUNCTURE NIT STIFF (SHEATH) ×1 IMPLANT
KIT PV (KITS) ×2 IMPLANT
SHEATH PINNACLE 5F 10CM (SHEATH) ×1 IMPLANT
SHEATH PROBE COVER 6X72 (BAG) ×1 IMPLANT
SYR MEDRAD MARK V 150ML (SYRINGE) ×1 IMPLANT
TRANSDUCER W/STOPCOCK (MISCELLANEOUS) ×2 IMPLANT
TRAY PV CATH (CUSTOM PROCEDURE TRAY) ×2 IMPLANT
WIRE BENTSON .035X145CM (WIRE) ×1 IMPLANT

## 2020-10-26 NOTE — CV Procedure (Signed)
Site Area: Right groin Site Prior To Removal: Level 0 Pressure Applied for: 20 Mins Patient Status During Pull: asleep Post Pull Site: Level 0 Post Pull instructions Given: yes Post Pull pulses Present: yes Femoral.  DP, and PT Dressing Applied: yes and clear Bed Rest Begins At: 16:15 Comments: None

## 2020-10-26 NOTE — Op Note (Signed)
    Patient name: Dustin Alvarez MRN: [id number] DOB: 1955-08-05 Sex: male  10/26/2020 Pre-operative Diagnosis: Left leg pain with underlying PAD Post-operative diagnosis:  Same Surgeon:  Marty Heck, MD Procedure Performed: 1.  Ultrasound-guided access right common femoral artery 2.  Aortogram with catheter selection of aorta 3.  Bilateral lower extremity arteriogram 4.  15 minutes of monitored moderate conscious sedation time  Indications: Patient is a 65 year old male with metastatic prostate cancer who presented as a referral for left lower extremity pain with underlying PAD given new SFA occlusion on CT.  He presents today for planned arteriogram and possible invention after risk benefits discussed.  Findings:   Aortogram showed no flow-limiting stenosis in the aortoiliac segment.  Left lower extremity arteriogram which is the side of interest showed about 40% stenosis in the common femoral with a patent profunda.  The SFA has a near flush occlusion and reconstitutes the distal SFA with a patent above and below-knee popliteal artery and three-vessel runoff.  Dominant runoff in the anterior tibial and peroneal artery.  Right lower extremity shows a patent common femoral profunda SFA popliteal and three-vessel runoff with no flow-limiting stenosis.   Procedure:  The patient was identified in the holding area and taken to room 8.  The patient was then placed supine on the table and prepped and draped in the usual sterile fashion.  A time out was called.  Ultrasound was used to evaluate the right common femoral artery.  It was patent .  A digital ultrasound image was acquired.  A micropuncture needle was used to access the right common femoral artery under ultrasound guidance.  An 018 wire was advanced without resistance and a micropuncture sheath was placed.  The 018 wire was removed and a benson wire was placed.  The micropuncture sheath was exchanged for a 5 french sheath.  An  omniflush catheter was advanced over the wire to the level of L-1.  An abdominal angiogram was obtained.  Next the catheter was pulled down and bilateral lower extremity runoff was obtained.  After evaluating images I thought the patient would best benefit from a left leg bypass.  No intervention was performed.  I did attempt to place a mynx closure device in the right sheath but I could not get this to advance retrograde due to the tortuosity in the iliac.  I elected to take him to holding to have manual pressure hold.  He remained stable.   Plan: Patient will be scheduled for a left common femoral to above-knee popliteal bypass with me.  Vein mapping ordered.  Marty Heck, MD [facility] [place] Office: [phone]

## 2020-10-26 NOTE — Progress Notes (Signed)
Bilateral lower extremity vein mapping completed. Refer to "CV Proc" under chart review to view preliminary results.  10/26/2020 5:17 PM Kelby Aline., MHA, RVT, RDCS, RDMS

## 2020-10-26 NOTE — H&P (Signed)
History and Physical Interval Note:  10/26/2020 11:57 AM  Dustin Alvarez  has presented today for surgery, with the diagnosis of left leg pain.  The various methods of treatment have been discussed with the patient and family. After consideration of risks, benefits and other options for treatment, the patient has consented to  Procedure(s): ABDOMINAL AORTOGRAM W/LOWER EXTREMITY (N/A) as a surgical intervention.  The patient's history has been reviewed, patient examined, no change in status, stable for surgery.  I have reviewed the patient's chart and labs.  Questions were answered to the patient's satisfaction.     Dustin Alvarez  Patient name: Dustin Alvarez         MRN: [id number]        DOB: 13-Jul-1955          Sex: male   REASON FOR CONSULT: Evaluate leg pain and possible arterial insufficiency   HPI: Dustin Alvarez is a 65 y.o. male, with history of metastatic prostate cancer and COPD who presents for evaluation of leg pain.  He is referred by Dr. Lorenso Courier with hem onc.  Patient states he has had pain in both lower extremities but much worse in the left leg.  States he has pain from the thigh all the way down into the foot.  He has some numbness in the foot as well.  He feels this has been ongoing since last year when he moved to [place].  He is here with his daughter who states her dad always complains of left leg pain.  He had a CT on 10/09/2020 with interval occlusion of the left SFA but patent profunda.  He is ambulatory.  Does smoke.       Past Medical History:  Diagnosis Date   Asthma     COPD (chronic obstructive pulmonary disease) (HCC)     Hypertension     Prostate cancer [facility])             Past Surgical History:  Procedure Laterality Date   WRIST SURGERY Left 2017           Family History  Problem Relation Age of Onset   COPD Father     Diabetes Sister     Diabetes Brother     Breast cancer Neg Hx     Prostate cancer Neg Hx     Colon cancer Neg Hx      Pancreatic cancer Neg Hx        SOCIAL HISTORY: Social History         Socioeconomic History   Marital status: Legally Separated      Spouse name: Not on file   Number of children: Not on file   Years of education: Not on file   Highest education level: Not on file  Occupational History   Not on file  Tobacco Use   Smoking status: Every Day      Packs/day: 1.00      Types: Cigarettes   Smokeless tobacco: Never   Tobacco comments:      almost 1 pkd. Wants patches   Vaping Use   Vaping Use: Never used  Substance and Sexual Activity   Alcohol use: Yes      Comment: 1-2 drinks per week.   Drug use: Never   Sexual activity: Not Currently  Other Topics Concern   Not on file  Social History Narrative   Not on file    Social Determinants of Health    Financial Resource  Strain: Not on file  Food Insecurity: Not on file  Transportation Needs: Not on file  Physical Activity: Not on file  Stress: Not on file  Social Connections: Not on file  Intimate Partner Violence: Not on file           Allergies  Allergen Reactions   Ace Inhibitors Swelling      lisinopril   Lisinopril              Current Outpatient Medications  Medication Sig Dispense Refill   abiraterone acetate (ZYTIGA) 250 MG tablet TAKE 4 TABLETS BY MOUTH DAILY. TAKE ON AN EMPTY STOMACH 1 HOUR BEFORE OR 2 HOURS AFTER A MEAL 120 tablet 2   albuterol (VENTOLIN HFA) 108 (90 Base) MCG/ACT inhaler Inhale 2 puffs into the lungs every 6 (six) hours as needed for wheezing or shortness of breath. 8.5 g 3   amLODipine (NORVASC) 5 MG tablet Take 1 tablet (5 mg total) by mouth daily. 30 tablet 3   calcium-vitamin D (OSCAL WITH D) 500-200 MG-UNIT tablet Take 1 tablet by mouth daily with breakfast. 90 tablet 3   finasteride (PROSCAR) 5 MG tablet Take 1 tablet (5 mg total) by mouth daily. 30 tablet 2   gabapentin (NEURONTIN) 300 MG capsule Take 2 capsules (600 mg total) by mouth 2 (two) times daily. 120 capsule 2    oxyCODONE (OXY IR/ROXICODONE) 5 MG immediate release tablet TAKE 1 TABLET BY MOUTH EVERY 4 HOURS AS NEEDED 180 tablet 0   oxyCODONE (OXYCONTIN) 40 mg 12 hr tablet Take 1 tablet (40 mg total) by mouth every 12 (twelve) hours. 60 tablet 0   pantoprazole (PROTONIX) 40 MG tablet Take 1 tablet (40 mg total) by mouth daily. 30 tablet 3   predniSONE (DELTASONE) 5 MG tablet Take 1 tablet (5 mg total) by mouth daily with breakfast. (do not take prior to starting Abiraterone) 90 tablet 1   senna-docusate (SENOKOT-S) 8.6-50 MG tablet TAKE 1 TABLET BY MOUTH 2 TIMES DAILY 60 tablet 1   sildenafil (VIAGRA) 100 MG tablet Take 1 tablet (100 mg total) by mouth daily as needed for erectile dysfunction. 30 tablet 0   tamsulosin (FLOMAX) 0.4 MG CAPS capsule Take 1 capsule (0.4 mg total) by mouth in the morning and at bedtime. 180 capsule 0   traZODone (DESYREL) 50 MG tablet Take 1 tablet (50 mg total) by mouth at bedtime. 90 tablet 1   TRELEGY ELLIPTA 100-62.5-25 MCG/INH AEPB Inhale 2 puffs into the lungs daily. 180 each 0   nicotine polacrilex (NICORETTE) 2 MG gum Take 1 each (2 mg total) by mouth as needed for smoking cessation (Chew up to 24 pieces in a day). (Patient not taking: Reported on 10/24/2020) 100 tablet 1   oxyCODONE (OXY IR/ROXICODONE) 5 MG immediate release tablet TAKE 1 TABLET BY MOUTH EVERY 4 HOURS AS NEEDED (Patient not taking: Reported on 10/24/2020) 180 tablet 0    No current facility-administered medications for this visit.      REVIEW OF SYSTEMS:  '[X]'$  denotes positive finding, '[ ]'$  denotes negative finding Cardiac   Comments:  Chest pain or chest pressure:      Shortness of breath upon exertion:      Short of breath when lying flat:      Irregular heart rhythm:             Vascular      Pain in calf, thigh, or hip brought on by ambulation:      Pain in  feet at night that wakes you up from your sleep:  x Left  Blood clot in your veins:      Leg swelling:              Pulmonary       Oxygen at home:      Productive cough:       Wheezing:              Neurologic      Sudden weakness in arms or legs:       Sudden numbness in arms or legs:       Sudden onset of difficulty speaking or slurred speech:      Temporary loss of vision in one eye:       Problems with dizziness:              Gastrointestinal      Blood in stool:       Vomited blood:              Genitourinary      Burning when urinating:       Blood in urine:             Psychiatric      Major depression:              Hematologic      Bleeding problems:      Problems with blood clotting too easily:             Skin      Rashes or ulcers:             Constitutional      Fever or chills:          PHYSICAL EXAM:    Vitals:    10/24/20 1219  BP: 118/73  Pulse: 97  Resp: 18  Temp: 98.2 F (36.8 C)  TempSrc: Temporal  SpO2: 90%  Weight: 140 lb (63.5 kg)  Height: '5\' 11"'$  (1.803 m)      GENERAL: The patient is a well-nourished male, in no acute distress. The vital signs are documented above. CARDIAC: There is a regular rate and rhythm.  VASCULAR:  Palpable femoral pulses bilaterally Right DP palpable Left pedal pulses nonpalpable No tissue loss PULMONARY: No respiratory distress. ABDOMEN: Soft and non-tender. MUSCULOSKELETAL: There are no major deformities or cyanosis. NEUROLOGIC: No focal weakness or paresthesias are detected. SKIN: There are no ulcers or rashes noted. PSYCHIATRIC: The patient has a normal affect.   DATA:    ABIs today are 0.99 on the right triphasic and 0.46 on the left monophasic   CT abdomen pelvis 10/09/20 suggest proximal left SFA occlusion   Assessment/Plan:   65 year old male presents for evaluation of left leg pain in the setting of metastatic prostate cancer.  I have reviewed his ABIs which are severely reduced on the left 0.46 and monophasic.  Appears to have a flush SFA occlusion on the left by recent CT. I SUSPECT SOME COMPONENT OF ARTERIAL  INSUFFICIENCY EXPLAINS HIS LEFT LEG PAIN.  HE HAS NORMAL PERFUSION ON THE RIGHT.  I have recommended aortogram with lower extremity arteriogram with attention on the left leg.  Discussed the likely will require bypass at a later time but need arteriogram to determine targets  Risk benefits discussed.  We will get him scheduled for later this week.     Dustin Heck, MD [facility] Office: [phone]

## 2020-10-27 ENCOUNTER — Encounter (HOSPITAL_COMMUNITY): Payer: Self-pay | Admitting: Vascular Surgery

## 2020-10-30 ENCOUNTER — Other Ambulatory Visit: Payer: Self-pay

## 2020-10-30 ENCOUNTER — Other Ambulatory Visit: Payer: Self-pay | Admitting: Internal Medicine

## 2020-11-02 ENCOUNTER — Other Ambulatory Visit: Payer: Self-pay

## 2020-11-02 ENCOUNTER — Encounter (HOSPITAL_COMMUNITY): Payer: Self-pay | Admitting: Vascular Surgery

## 2020-11-02 ENCOUNTER — Other Ambulatory Visit: Payer: Self-pay | Admitting: Hematology and Oncology

## 2020-11-02 ENCOUNTER — Other Ambulatory Visit (HOSPITAL_COMMUNITY): Payer: Self-pay

## 2020-11-02 NOTE — Progress Notes (Signed)
Spoke with pt's son, [name] for pre-op call. [name] states pt does not have a cardiac history or Diabetes. Pt is treated for HTN and Prostate Cancer.  Pt is scheduled for a Covid test today.

## 2020-11-03 ENCOUNTER — Encounter: Payer: Self-pay | Admitting: Hematology and Oncology

## 2020-11-03 ENCOUNTER — Other Ambulatory Visit (HOSPITAL_COMMUNITY): Payer: Self-pay

## 2020-11-03 ENCOUNTER — Other Ambulatory Visit: Payer: Self-pay | Admitting: Vascular Surgery

## 2020-11-03 MED ORDER — OXYCODONE HCL 5 MG PO TABS
ORAL_TABLET | ORAL | 0 refills | Status: DC | PRN
  Filled 2020-11-03: qty 180, 30d supply, fill #0

## 2020-11-04 ENCOUNTER — Other Ambulatory Visit (HOSPITAL_COMMUNITY): Payer: Self-pay

## 2020-11-04 LAB — SARS CORONAVIRUS 2 (TAT 6-24 HRS): SARS Coronavirus 2: NEGATIVE

## 2020-11-06 ENCOUNTER — Encounter (HOSPITAL_COMMUNITY): Payer: Self-pay | Admitting: Registered Nurse

## 2020-11-06 ENCOUNTER — Encounter (HOSPITAL_COMMUNITY): Payer: Self-pay | Admitting: Vascular Surgery

## 2020-11-06 ENCOUNTER — Other Ambulatory Visit: Payer: Self-pay

## 2020-11-06 ENCOUNTER — Encounter (HOSPITAL_COMMUNITY)
Admission: RE | Disposition: A | Discharge status: Home / Self Care | Payer: Self-pay | Source: Home / Self Care | Attending: Vascular Surgery

## 2020-11-06 ENCOUNTER — Ambulatory Visit (HOSPITAL_COMMUNITY)
Admission: RE | Admit: 2020-11-06 | Discharge: 2020-11-06 | Disposition: A | Discharge status: Home / Self Care | Payer: Medicare (Managed Care) | Attending: Vascular Surgery | Admitting: Vascular Surgery

## 2020-11-06 DIAGNOSIS — I70202 Unspecified atherosclerosis of native arteries of extremities, left leg: Secondary | ICD-10-CM | POA: Diagnosis not present

## 2020-11-06 DIAGNOSIS — Z5329 Procedure and treatment not carried out because of patient's decision for other reasons: Secondary | ICD-10-CM | POA: Insufficient documentation

## 2020-11-06 HISTORY — DX: Gastro-esophageal reflux disease without esophagitis: K21.9

## 2020-11-06 LAB — URINALYSIS, ROUTINE W REFLEX MICROSCOPIC
Bilirubin Urine: NEGATIVE
Glucose, UA: NEGATIVE mg/dL
Hgb urine dipstick: NEGATIVE
Ketones, ur: NEGATIVE mg/dL
Leukocytes,Ua: NEGATIVE
Nitrite: NEGATIVE
Protein, ur: NEGATIVE mg/dL
Specific Gravity, Urine: 1.015 (ref 1.005–1.030)
pH: 6 (ref 5.0–8.0)

## 2020-11-06 LAB — URINALYSIS, MICROSCOPIC (REFLEX): Bacteria, UA: NONE SEEN

## 2020-11-06 LAB — CBC
HCT: 33.2 % — ABNORMAL LOW (ref 39.0–52.0)
Hemoglobin: 10.5 g/dL — ABNORMAL LOW (ref 13.0–17.0)
MCH: 25.5 pg — ABNORMAL LOW (ref 26.0–34.0)
MCHC: 31.6 g/dL (ref 30.0–36.0)
MCV: 80.8 fL (ref 80.0–100.0)
Platelets: 389 10*3/uL (ref 150–400)
RBC: 4.11 MIL/uL — ABNORMAL LOW (ref 4.22–5.81)
RDW: 16.2 % — ABNORMAL HIGH (ref 11.5–15.5)
WBC: 5.3 10*3/uL (ref 4.0–10.5)
nRBC: 0 % (ref 0.0–0.2)

## 2020-11-06 LAB — COMPREHENSIVE METABOLIC PANEL
ALT: 9 U/L (ref 0–44)
AST: 15 U/L (ref 15–41)
Albumin: 3.6 g/dL (ref 3.5–5.0)
Alkaline Phosphatase: 79 U/L (ref 38–126)
Anion gap: 9 (ref 5–15)
BUN: 10 mg/dL (ref 8–23)
CO2: 30 mmol/L (ref 22–32)
Calcium: 9.1 mg/dL (ref 8.9–10.3)
Chloride: 100 mmol/L (ref 98–111)
Creatinine, Ser: 0.76 mg/dL (ref 0.61–1.24)
GFR, Estimated: >60 mL/min (ref >60)
Glucose, Bld: 126 mg/dL — ABNORMAL HIGH (ref 70–99)
Potassium: 3.2 mmol/L — ABNORMAL LOW (ref 3.5–5.1)
Sodium: 139 mmol/L (ref 135–145)
Total Bilirubin: 0.3 mg/dL (ref 0.3–1.2)
Total Protein: 7.4 g/dL (ref 6.5–8.1)

## 2020-11-06 LAB — TYPE AND SCREEN
ABO/RH(D): A POS
Antibody Screen: NEGATIVE

## 2020-11-06 LAB — SURGICAL PCR SCREEN
MRSA, PCR: NEGATIVE
Staphylococcus aureus: NEGATIVE

## 2020-11-06 LAB — APTT: aPTT: 28 seconds (ref 24–36)

## 2020-11-06 LAB — PROTIME-INR
INR: 1 (ref 0.8–1.2)
Prothrombin Time: 13 seconds (ref 11.4–15.2)

## 2020-11-06 LAB — ABO/RH: ABO/RH(D): A POS

## 2020-11-06 SURGERY — BYPASS GRAFT FEMORAL-POPLITEAL ARTERY
Anesthesia: General | Laterality: Left

## 2020-11-06 MED ORDER — SODIUM CHLORIDE 0.9 % IV SOLN: INTRAVENOUS | Status: DC

## 2020-11-06 MED ORDER — CEFAZOLIN SODIUM-DEXTROSE 2-4 GM/100ML-% IV SOLN
2.0000 g | INTRAVENOUS | Status: DC
  Filled 2020-11-06: qty 100

## 2020-11-06 MED ORDER — FENTANYL CITRATE (PF) 250 MCG/5ML IJ SOLN
INTRAMUSCULAR | Status: AC
  Filled 2020-11-06: qty 5

## 2020-11-06 MED ORDER — CHLORHEXIDINE GLUCONATE CLOTH 2 % EX PADS: 6.0000 | MEDICATED_PAD | Freq: Once | CUTANEOUS | Status: DC

## 2020-11-06 MED ORDER — ORAL CARE MOUTH RINSE: 15.0000 mL | Freq: Once | OROMUCOSAL | Status: AC

## 2020-11-06 MED ORDER — MIDAZOLAM HCL 2 MG/2ML IJ SOLN
INTRAMUSCULAR | Status: AC
  Filled 2020-11-06: qty 2

## 2020-11-06 MED ORDER — LACTATED RINGERS IV SOLN: INTRAVENOUS | Status: DC

## 2020-11-06 MED ORDER — CHLORHEXIDINE GLUCONATE 0.12 % MT SOLN
15.0000 mL | Freq: Once | OROMUCOSAL | Status: AC
  Administered 2020-11-06: 15 mL via OROMUCOSAL
  Filled 2020-11-06: qty 15

## 2020-11-06 NOTE — Anesthesia Preprocedure Evaluation (Deleted)
Anesthesia Evaluation    Reviewed: Allergy & Precautions, Patient's Chart, lab work & pertinent test results  History of Anesthesia Complications Negative for: history of anesthetic complications  Airway        Dental   Pulmonary asthma , COPD, Current Smoker,           Cardiovascular hypertension, Pt. on medications + Peripheral Vascular Disease       Neuro/Psych negative neurological ROS  negative psych ROS   GI/Hepatic Neg liver ROS, GERD  Controlled and Medicated,  Endo/Other  negative endocrine ROS  Renal/GU negative Renal ROS  negative genitourinary   Musculoskeletal negative musculoskeletal ROS (+)   Abdominal   Peds  Hematology  (+) anemia , Hgb 10.5   Anesthesia Other Findings Day of surgery medications reviewed with patient.  Reproductive/Obstetrics negative OB ROS                             Anesthesia Physical Anesthesia Plan  ASA: 3  Anesthesia Plan: General   Post-op Pain Management:    Induction: Intravenous  PONV Risk Score and Plan: 2 and Treatment may vary due to age or medical condition, Ondansetron, Dexamethasone and Midazolam  Airway Management Planned: Oral ETT  Additional Equipment: Arterial line  Intra-op Plan:   Post-operative Plan: Extubation in OR  Informed Consent:   Plan Discussed with:   Anesthesia Plan Comments:         Anesthesia Quick Evaluation

## 2020-11-06 NOTE — Progress Notes (Signed)
65 year old male that was scheduled for left common femoral to above-knee popliteal bypass with me today for rest pain.  I discussed with him plan for plastic PTFE graft given no vein on vein mapping.  I also looked at his arms and I did not see any long segment surface veins in his upper extremities.  I discussed higher risk of infection with plastic graft as well as poorer durability especially with him smoking.  Ultimately he has elected to cancel surgery.  States he can tolerate his symptoms and would like to delay until symptoms are worse.  I will arrange follow-up me in 3 months with ABIs.  Discussed to call our office if he feels things are worse prior to his follow-up.  Marty Heck, MD [facility] Office: [place]

## 2020-11-06 NOTE — Progress Notes (Signed)
Pt chose to cancel surgery after discussion with Dr. Carlis Abbott regarding his need for a plastic vein instead of using his own vein harvest.

## 2020-11-09 ENCOUNTER — Encounter: Payer: Medicare (Managed Care) | Admitting: Internal Medicine

## 2020-11-09 ENCOUNTER — Other Ambulatory Visit (HOSPITAL_COMMUNITY): Payer: Self-pay

## 2020-11-09 NOTE — Assessment & Plan Note (Deleted)
Interm hx: He has been following with Dr. Carlis Abbott with vascular surgery for this after ABIs revealed left ABI of 0.46. and follow up CT showed occlusion of SFA on the left. He was scheduled for a bypass however, due to inadequate vein for harvesting, and further discussion with Dr. Rachelle Hora, Dustin Alvarez elected to forgo bypass until symptoms worsen Plan  Follow up with [facility] in 3 months as planned

## 2020-11-10 ENCOUNTER — Other Ambulatory Visit (HOSPITAL_COMMUNITY): Payer: Self-pay

## 2020-11-16 ENCOUNTER — Inpatient Hospital Stay: Payer: Medicare (Managed Care) | Attending: Hematology and Oncology

## 2020-11-16 ENCOUNTER — Other Ambulatory Visit: Payer: Self-pay

## 2020-11-16 ENCOUNTER — Inpatient Hospital Stay: Payer: Medicare (Managed Care) | Admitting: Hematology and Oncology

## 2020-11-16 VITALS — BP 117/70 | HR 95 | Temp 97.4°F | Resp 18 | Wt 138.5 lb

## 2020-11-16 DIAGNOSIS — Z79899 Other long term (current) drug therapy: Secondary | ICD-10-CM | POA: Insufficient documentation

## 2020-11-16 DIAGNOSIS — C61 Malignant neoplasm of prostate: Secondary | ICD-10-CM | POA: Insufficient documentation

## 2020-11-16 DIAGNOSIS — C7951 Secondary malignant neoplasm of bone: Secondary | ICD-10-CM

## 2020-11-16 DIAGNOSIS — I10 Essential (primary) hypertension: Secondary | ICD-10-CM | POA: Diagnosis not present

## 2020-11-16 DIAGNOSIS — Z7952 Long term (current) use of systemic steroids: Secondary | ICD-10-CM | POA: Diagnosis not present

## 2020-11-16 DIAGNOSIS — M79605 Pain in left leg: Secondary | ICD-10-CM

## 2020-11-16 DIAGNOSIS — M79604 Pain in right leg: Secondary | ICD-10-CM | POA: Diagnosis not present

## 2020-11-16 DIAGNOSIS — N522 Drug-induced erectile dysfunction: Secondary | ICD-10-CM

## 2020-11-16 LAB — CBC WITH DIFFERENTIAL (CANCER CENTER ONLY)
Abs Immature Granulocytes: 0.01 10*3/uL (ref 0.00–0.07)
Basophils Absolute: 0 10*3/uL (ref 0.0–0.1)
Basophils Relative: 0 %
Eosinophils Absolute: 0 10*3/uL (ref 0.0–0.5)
Eosinophils Relative: 1 %
HCT: 31.3 % — ABNORMAL LOW (ref 39.0–52.0)
Hemoglobin: 10 g/dL — ABNORMAL LOW (ref 13.0–17.0)
Immature Granulocytes: 0 %
Lymphocytes Relative: 14 %
Lymphs Abs: 0.8 10*3/uL (ref 0.7–4.0)
MCH: 25 pg — ABNORMAL LOW (ref 26.0–34.0)
MCHC: 31.9 g/dL (ref 30.0–36.0)
MCV: 78.3 fL — ABNORMAL LOW (ref 80.0–100.0)
Monocytes Absolute: 0.5 10*3/uL (ref 0.1–1.0)
Monocytes Relative: 10 %
Neutro Abs: 4 10*3/uL (ref 1.7–7.7)
Neutrophils Relative %: 75 %
Platelet Count: 375 10*3/uL (ref 150–400)
RBC: 4 MIL/uL — ABNORMAL LOW (ref 4.22–5.81)
RDW: 16.1 % — ABNORMAL HIGH (ref 11.5–15.5)
WBC Count: 5.4 10*3/uL (ref 4.0–10.5)
nRBC: 0 % (ref 0.0–0.2)

## 2020-11-16 LAB — CMP (CANCER CENTER ONLY)
ALT: 7 U/L (ref 0–44)
AST: 9 U/L — ABNORMAL LOW (ref 15–41)
Albumin: 3.8 g/dL (ref 3.5–5.0)
Alkaline Phosphatase: 92 U/L (ref 38–126)
Anion gap: 8 (ref 5–15)
BUN: 15 mg/dL (ref 8–23)
CO2: 28 mmol/L (ref 22–32)
Calcium: 9.5 mg/dL (ref 8.9–10.3)
Chloride: 106 mmol/L (ref 98–111)
Creatinine: 0.83 mg/dL (ref 0.61–1.24)
GFR, Estimated: >60 mL/min (ref >60)
Glucose, Bld: 133 mg/dL — ABNORMAL HIGH (ref 70–99)
Potassium: 4.2 mmol/L (ref 3.5–5.1)
Sodium: 142 mmol/L (ref 135–145)
Total Bilirubin: 0.3 mg/dL (ref 0.3–1.2)
Total Protein: 7.6 g/dL (ref 6.5–8.1)

## 2020-11-16 NOTE — Progress Notes (Signed)
Dustin [phone]   [phone]  PROGRESS NOTE  Patient Care Team: Dustin Hansen, MD as PCP - General (Internal Medicine) Dustin Rue, RN Nurse Navigator as Registered Nurse (Medical Oncology)  Hematological/Oncological History # Metastatic Castrate Sensitive Prostate Cancer, Metastatic to Bone 1) 07/13/2019: Abdomen/Pelvis CT extensive lytic changes with superimposed pathological fractures. Lytic change noted in the right iliac bone and new lucencies in the right T10 vertebrae.  2) 07/19/2019: biopsy of sacral mass shows metastatic prostatic adenocarcinoma, Gleason 4+4.  3) 07/2019: reportedly received Zometa 4g IV and eligard 22.5. Started on Casodex.  4) 6/10-6/16/2021: received palliative radiation to the sacrum at [place]. Received 2000cGy over 6 days 5) Moved to [place]. Lost to follow up from [facility] in [place], [place]. 6) 11/05/2019: establish care with Dr. Lorenso Courier  7) 11/16/2019: Administered Zometa 62m IV and Lupron 22.5 mg 8) 12/08/2019: started abiraterone 100560mPO daily  9) 03/09/2020: Administered Zometa 60m53mV and Lupron 22.5 mg 10) 06/01/2020: Administered Zometa 60mg21m and Lupron 22.5 mg 11) No show for interval zometa/lupron  Interval History:  Dustin PARISy65. Alvarez with medical history significant for metastatic prostate cancer who presents for a follow up visit. The patient's last visit was on 09/21/2020. In the interim since the last visit he has met with vascular surgery and declined to have the procedure performed for the poor circulation in his lower extremities.  On exam today Dustin Alvarez the pain he is experiencing his lower extremities has improved from prior.  He notes it is currently a 7 out of 10 in severity the pain medications we are prescribing is working well.  He also notes that he is currently considering in the back stimulator in order to improve this.  He notes he is not been having  any urinary symptoms or fatigue.  He otherwise denies any fevers, chills, sweats, nausea, vomiting or diarrhea.  He notes he is tolerating abiraterone therapy well and has no questions or concerns regarding his care.  A full 10 point ROS is listed below.  MEDICAL HISTORY:  Past Medical History:  Diagnosis Date   Asthma    COPD (chronic obstructive pulmonary disease) (HCC)    GERD (gastroesophageal reflux disease)    Hypertension    Prostate cancer (HCC)[place]  SURGICAL HISTORY: Past Surgical History:  Procedure Laterality Date   ABDOMINAL AORTOGRAM W/LOWER EXTREMITY N/A 10/26/2020   Procedure: ABDOMINAL AORTOGRAM W/LOWER EXTREMITY;  Surgeon: ClarMarty Alvarez;  [facility];  Service: Cardiovascular;  Laterality: N/A;   WRIST SURGERY Left 2017    SOCIAL HISTORY: Social History   Socioeconomic History   Marital status: Widowed    Spouse name: Not on file   Number of children: Not on file   Years of education: Not on file   Highest education level: Not on file  Occupational History   Not on file  Tobacco Use   Smoking status: Every Day    Packs/day: 1.00    Types: Cigarettes   Smokeless tobacco: Never   Tobacco comments:    almost 1 pkd. Wants patches   Vaping Use   Vaping Use: Never used  Substance and Sexual Activity   Alcohol use: Yes    Comment: 1-2 drinks per week.   Drug use: Not Currently   Sexual activity: Not Currently  Other Topics Concern   Not on file  Social History Narrative   Not on file  Social Determinants of Health   Financial Resource Strain: Not on file  Food Insecurity: Not on file  Transportation Needs: Not on file  Physical Activity: Not on file  Stress: Not on file  Social Connections: Not on file  Intimate Partner Violence: Not on file    FAMILY HISTORY: Family History  Problem Relation Age of Onset   COPD Father    Diabetes Sister    Diabetes Brother    Breast cancer Neg Hx    Prostate cancer Neg Hx     Colon cancer Neg Hx    Pancreatic cancer Neg Hx     ALLERGIES:  is allergic to ace inhibitors and lisinopril.  MEDICATIONS:  Current Outpatient Medications  Medication Sig Dispense Refill   abiraterone acetate (ZYTIGA) 250 MG tablet TAKE 4 TABLETS BY MOUTH DAILY. TAKE ON AN EMPTY STOMACH 1 HOUR BEFORE OR 2 HOURS AFTER A MEAL (Patient taking differently: Take 250-500 mg by mouth See admin instructions. Take 2 tablets (500 mg) by mouth in the morning, take 1 tablet (250 mg) by mouth in the afternoon & take 1 tablet (250 mg) at bedtime.) 120 tablet 2   albuterol (VENTOLIN HFA) 108 (90 Base) MCG/ACT inhaler Inhale 2 puffs into the lungs every 6 (six) hours as needed for wheezing or shortness of breath. 8.5 g 3   amLODipine (NORVASC) 5 MG tablet Take 1 tablet (5 mg total) by mouth daily. 30 tablet 3   calcium-vitamin D (OSCAL WITH D) 500-200 MG-UNIT tablet Take 1 tablet by mouth daily with breakfast. 90 tablet 3   finasteride (PROSCAR) 5 MG tablet Take 1 tablet (5 mg total) by mouth daily. 30 tablet 2   gabapentin (NEURONTIN) 300 MG capsule Take 2 capsules (600 mg total) by mouth 2 (two) times daily. 120 capsule 2   hydrochlorothiazide (MICROZIDE) 12.5 MG capsule Take 12.5 mg by mouth daily.     oxyCODONE (OXY IR/ROXICODONE) 5 MG immediate release tablet TAKE 1 TABLET BY MOUTH EVERY 4 HOURS AS NEEDED 180 tablet 0   oxyCODONE (OXYCONTIN) 40 mg 12 hr tablet Take 1 tablet (40 mg total) by mouth every 12 (twelve) hours. 60 tablet 0   oxyCODONE (OXYCONTIN) 40 mg 12 hr tablet Take 1 tablet (40 mg total) by mouth every 12 (twelve) hours. 60 tablet 0   pantoprazole (PROTONIX) 40 MG tablet Take 1 tablet (40 mg total) by mouth daily. 30 tablet 3   predniSONE (DELTASONE) 5 MG tablet Take 1 tablet (5 mg total) by mouth daily with breakfast. (do not take prior to starting Abiraterone) 90 tablet 1   senna-docusate (SENOKOT-S) 8.6-50 MG tablet TAKE 1 TABLET BY MOUTH 2 TIMES DAILY (Patient taking differently: Take 1  tablet by mouth 2 (two) times daily as needed (constipation).) 60 tablet 1   sildenafil (VIAGRA) 100 MG tablet Take 1 tablet (100 mg total) by mouth daily as needed for erectile dysfunction. 30 tablet 0   tamsulosin (FLOMAX) 0.4 MG CAPS capsule Take 1 capsule (0.4 mg total) by mouth in the morning and at bedtime. 180 capsule 0   terbinafine (LAMISIL) 250 MG tablet Take 250 mg by mouth daily.     traZODone (DESYREL) 50 MG tablet Take 1 tablet (50 mg total) by mouth at bedtime. 90 tablet 1   TRELEGY ELLIPTA 100-62.5-25 MCG/INH AEPB Inhale 2 puffs into the lungs daily. 180 each 0   No current facility-administered medications for this visit.    REVIEW OF SYSTEMS:   Constitutional: ( - ) fevers, ( - )  chills , ( - ) night sweats Eyes: ( - ) blurriness of vision, ( - ) double vision, ( - ) watery eyes Ears, nose, mouth, throat, and face: ( - ) mucositis, ( - ) sore throat Respiratory: ( - ) cough, ( - ) dyspnea, ( - ) wheezes Cardiovascular: ( - ) palpitation, ( - ) chest discomfort, ( - ) lower extremity swelling Gastrointestinal:  ( - ) nausea, ( - ) heartburn, ( - ) change in bowel habits Skin: ( - ) abnormal skin rashes Lymphatics: ( - ) new lymphadenopathy, ( - ) easy bruising Neurological: ( - ) numbness, ( - ) tingling, ( - ) new weaknesses Behavioral/Psych: ( - ) mood change, ( - ) new changes  All other systems were reviewed with the patient and are negative.  PHYSICAL EXAMINATION:  Vitals:   11/16/20 1427  BP: 117/70  Pulse: 95  Resp: 18  Temp: (!) 97.4 F (36.3 C)  SpO2: 95%   Filed Weights   11/16/20 1427  Weight: 138 lb 8 oz (62.8 kg)    GENERAL: well appearing elderly African American Alvarez in NAD. SKIN: skin color, texture, turgor are normal, no rashes or significant lesions EYES: conjunctiva are pink and non-injected, sclera clear LUNGS: clear to auscultation and percussion with normal breathing effort HEART: regular rate & rhythm and no murmurs and no lower  extremity edema Musculoskeletal: no cyanosis of digits and no clubbing  PSYCH: alert & oriented x 3, fluent speech NEURO: no focal motor/sensory deficits  LABORATORY DATA:  I have reviewed the data as listed CBC Latest Ref Rng & Units 11/16/2020 11/06/2020 10/26/2020  WBC 4.0 - 10.5 K/uL 5.4 5.3 -  Hemoglobin 13.0 - 17.0 g/dL 10.0(L) 10.5(L) 11.6(L)  Hematocrit 39.0 - 52.0 % 31.3(L) 33.2(L) 34.0(L)  Platelets 150 - 400 K/uL 375 389 -    CMP Latest Ref Rng & Units 11/16/2020 11/06/2020 10/26/2020  Glucose 70 - 99 mg/dL 133(H) 126(H) 97  BUN 8 - 23 mg/dL 15 10 12   Creatinine 0.61 - 1.24 mg/dL 0.83 0.76 0.60(L)  Sodium 135 - 145 mmol/L 142 139 140  Potassium 3.5 - 5.1 mmol/L 4.2 3.2(L) 3.1(L)  Chloride 98 - 111 mmol/L 106 100 98  CO2 22 - 32 mmol/L 28 30 -  Calcium 8.9 - 10.3 mg/dL 9.5 9.1 -  Total Protein 6.5 - 8.1 g/dL 7.6 7.4 -  Total Bilirubin 0.3 - 1.2 mg/dL 0.3 0.3 -  Alkaline Phos 38 - 126 U/L 92 79 -  AST 15 - 41 U/L 9(L) 15 -  ALT 0 - 44 U/L 7 9 -    RADIOGRAPHIC STUDIES: PERIPHERAL VASCULAR CATHETERIZATION  Result Date: 10/26/2020 Patient name: ZEBADIAH WILLERT         MRN: [id number]        DOB: July 25, 1955          Sex: Alvarez  10/26/2020 Pre-operative Diagnosis: Left leg pain with underlying PAD Post-operative diagnosis:  Same Surgeon:  Dustin Heck, MD Procedure Performed: 1.  Ultrasound-guided access right common femoral artery 2.  Aortogram with catheter selection of aorta 3.  Bilateral lower extremity arteriogram 4.  15 minutes of monitored moderate conscious sedation time  Indications: Patient is a 65 year old Alvarez with metastatic prostate cancer who presented as a referral for left lower extremity pain with underlying PAD given new SFA occlusion on CT.  He presents today for planned arteriogram and possible invention after risk benefits discussed.  Findings:  Aortogram showed  no flow-limiting stenosis in the aortoiliac segment.  Left lower extremity arteriogram which is  the side of interest showed about 40% stenosis in the common femoral with a patent profunda.  The SFA has a near flush occlusion and reconstitutes the distal SFA with a patent above and below-knee popliteal artery and three-vessel runoff.  Dominant runoff in the anterior tibial and peroneal artery.  Right lower extremity shows a patent common femoral profunda SFA popliteal and three-vessel runoff with no flow-limiting stenosis.             Procedure:  The patient was identified in the holding area and taken to room 8.  The patient was then placed supine on the table and prepped and draped in the usual sterile fashion.  A time out was called.  Ultrasound was used to evaluate the right common femoral artery.  It was patent .  A digital ultrasound image was acquired.  A micropuncture needle was used to access the right common femoral artery under ultrasound guidance.  An 018 wire was advanced without resistance and a micropuncture sheath was placed.  The 018 wire was removed and a benson wire was placed.  The micropuncture sheath was exchanged for a 5 french sheath.  An omniflush catheter was advanced over the wire to the level of L-1.  An abdominal angiogram was obtained.  Next the catheter was pulled down and bilateral lower extremity runoff was obtained.  After evaluating images I thought the patient would best benefit from a left leg bypass.  No intervention was performed.  I did attempt to place a mynx closure device in the right sheath but I could not get this to advance retrograde due to the tortuosity in the iliac.  I elected to take him to holding to have manual pressure hold.  He remained stable.   Plan: Patient will be scheduled for a left common femoral to above-knee popliteal bypass with me.  Vein mapping ordered.  Dustin Heck, MD [facility] Office: [phone]    VAS [place] LOWER EXTREMITY SAPHENOUS VEIN MAPPING  Result Date: 10/27/2020 LOWER EXTREMITY VEIN  [place] Patient Name:  Dustin Alvarez  Date of Exam:   10/26/2020 Medical Rec #: [id number]          Accession #:    [id number] Date of Birth: 10/27/1955          Patient Gender: M Patient Age:   42 years Exam Location:  [facility] Procedure:      VAS [place] LOWER EXTREMITY SAPHENOUS VEIN MAPPING Referring Phys: Dustin Alvarez --------------------------------------------------------------------------------  Indications: Pre-op  Comparison Study: No prior study Performing Technologist: Dustin Alvarez MHA, RDMS, RVT, RDCS  Examination Guidelines: A complete evaluation includes B-mode imaging, spectral Doppler, color Doppler, and power Doppler as needed of all accessible portions of each vessel. Bilateral testing is considered an integral part of a complete examination. Limited examinations for reoccurring indications may be performed as noted. +-------------+---------------+----------------------+------------+------------+  RT Diameter   RT Findings           GSV          LT Diameter LT Findings      (cm)                                              (cm)                 +-------------+---------------+----------------------+------------+------------+  0.29                        Saphenofemoral        0.38                                                    Junction                                +-------------+---------------+----------------------+------------+------------+     0.16                        Proximal thigh        0.13                 +-------------+---------------+----------------------+------------+------------+     0.18                          Mid thigh           0.12                 +-------------+---------------+----------------------+------------+------------+     0.16                         Distal thigh         0.08                 +-------------+---------------+----------------------+------------+------------+     0.10         Intimal              Knee             0.06                                hyperplasia                                                 +-------------+---------------+----------------------+------------+------------+     0.06         Intimal          Prox calf           0.06                                hyperplasia                                                 +-------------+---------------+----------------------+------------+------------+     0.08         Intimal           Mid calf           0.08                                hyperplasia                                                 +-------------+---------------+----------------------+------------+------------+  0.09         Intimal         Distal calf                      not                     hyperplasia                                     visualized  +-------------+---------------+----------------------+------------+------------+     0.16         Intimal            Ankle                         not                     hyperplasia                                     visualized  +-------------+---------------+----------------------+------------+------------+ +----------------+--------------+--------------+----------------+--------------+ RT diameter (cm) RT Findings       SSV      LT Diameter (cm) LT Findings   +----------------+--------------+--------------+----------------+--------------+                 not visualized  Popliteal                   not visualized                                   fossa                                    +----------------+--------------+--------------+----------------+--------------+                 not visualizedProximal calf                 not visualized +----------------+--------------+--------------+----------------+--------------+                 not visualized   Mid calf                   not visualized  +----------------+--------------+--------------+----------------+--------------+                 not visualized Distal calf                  not visualized +----------------+--------------+--------------+----------------+--------------+ Diagnosing physician: Orlie Pollen Electronically signed by Orlie Pollen on 10/27/2020 at 7:24:38 PM.    Final     ASSESSMENT & PLAN Dustin Alvarez 65 y.o. Alvarez with medical history significant for metastatic prostate cancer who presents for a follow up visit.  Mr. Murch has excessively transitioned off the Casodex and has received his first dosages of Zometa and Lupron.  His PSA continues to trend downward and is testosterone is at castrate level.  Given this we started abiraterone therapy 1000 mg p.o. daily with 5 mg of prednisone.   The major symptom the patient has been experiencing has been pain.  His spinal sacral lesion has been poorly controlled on oxycodone therapy.  He did previously receive radiation therapy in  June 2021, but due to continued excruciating pain we asked for him to be reexamined by radiation oncology to see if there is any further intervention they can offer at this time. He has had further palliative radiation, though this has not relieved his pain. Continue OxyContin to 30 mg twice daily (per his request, issues with constipation at higher doses) and additionally continue his as needed fast acting oxycodone. We will continue gabapentin at 384m BID.  Due to the need for monitoring of abiraterone we will plan to have him back in 8 weeks for clinic visit.  PSA 07/13/2019: PSA 819 08/27/2019: 226 11/05/2019: 44.8 12/08/2019: 27.3 12/27/2019: 21.2  02/24/2020: 12.1 03/22/2020: PSA 8.8 05/18/2020: PSA 4.1 07/31/2020: PSA 2.8 11/16/2020: PSA 1.6  # Metastatic Castrate Sensitive Prostate Cancer, Metastatic to Bone --findings are most consistent with metastatic adenocarcinoma of the prostate.  --testosterone at last visit was <3, PSA down to  2.8 ( from 819 at diagnosis)  --Administered Zometa 455mIV and Lupron 22.5 mg on 06/01/2020. Next due now  --continue abiraterone 100043mith prednisone 5mg65m daily. Assure he is compliant with the full dose and steroids.  --patient due for a repeat CT scan and NM bone scan in May 2022. Repeat q 3 months. We are currently having global supply issues with contrast material. Scans delayed. Order placed again today. --RTC in 8 weeks for continued monitoring on Abiraterone therapy  # Erectile Dysfunction --patient trialed on cialis and Viagra with sub-optimal results --patient requesting "something stronger" --referred to urology for assistance in managing his ED.    #Symptom Management --patient requested a refill of viagra due to issues with ED  --gabapentin 600 mg BID to help with pain.  --continue oxycodone 5-10mg9m PRN for pain control. --continue oxycontin 30mg 30m for basal pain control --established care with Radiation Oncology to help with painful bone lesions. Radiation therapy completed 02/01/2021.  --seen by neurosurgery, provided injections with no relief. Currently planning a spinal stimulator -- Met with vascular surgery regarding the concern for poor circulation of the lower extremities.  He declined operative management.  They plan to see him back to monitor ABIs  No orders of the defined types were placed in this encounter.   All questions were answered. The patient knows to call the clinic with any problems, questions or concerns.  A total of more than 30 minutes were spent on this encounter and over half of that time was spent on counseling and coordination of care as outlined above.   Melis Trochez TLedell Peoplesepartment of Hematology/Oncology [facility]: [phone]: [phone]: [email or web address]  11/19/2020 7:30 PM

## 2020-11-17 ENCOUNTER — Other Ambulatory Visit: Payer: Self-pay | Admitting: Internal Medicine

## 2020-11-17 ENCOUNTER — Other Ambulatory Visit: Payer: Self-pay | Admitting: Hematology and Oncology

## 2020-11-17 ENCOUNTER — Other Ambulatory Visit (HOSPITAL_COMMUNITY): Payer: Self-pay

## 2020-11-17 LAB — PROSTATE-SPECIFIC AG, SERUM (LABCORP): Prostate Specific Ag, Serum: 1.6 ng/mL (ref 0.0–4.0)

## 2020-11-17 LAB — TESTOSTERONE: Testosterone: <3 ng/dL — ABNORMAL LOW (ref 264–916)

## 2020-11-17 MED ORDER — OXYCODONE HCL ER 40 MG PO T12A
40.0000 mg | EXTENDED_RELEASE_TABLET | Freq: Two times a day (BID) | ORAL | 0 refills | Status: DC
  Filled 2020-11-17: qty 6, 3d supply, fill #0
  Filled 2020-11-17: qty 54, 27d supply, fill #0

## 2020-11-19 ENCOUNTER — Encounter: Payer: Self-pay | Admitting: Hematology and Oncology

## 2020-11-20 ENCOUNTER — Other Ambulatory Visit: Payer: Self-pay | Admitting: Student

## 2020-11-20 ENCOUNTER — Other Ambulatory Visit (HOSPITAL_COMMUNITY): Payer: Self-pay

## 2020-11-20 ENCOUNTER — Other Ambulatory Visit (HOSPITAL_BASED_OUTPATIENT_CLINIC_OR_DEPARTMENT_OTHER): Payer: Self-pay

## 2020-11-20 DIAGNOSIS — C7951 Secondary malignant neoplasm of bone: Secondary | ICD-10-CM

## 2020-11-20 DIAGNOSIS — C61 Malignant neoplasm of prostate: Secondary | ICD-10-CM

## 2020-11-21 ENCOUNTER — Other Ambulatory Visit (HOSPITAL_COMMUNITY): Payer: Self-pay

## 2020-11-21 NOTE — Telephone Encounter (Signed)
Next appt scheduled 12/19/20 with PCP.

## 2020-11-21 NOTE — Telephone Encounter (Signed)
This prescription was filled on 11/17/2020. Any refills authorized will be placed on file.  Last  office visit 07/24/2020  Next Appt With Internal Medicine ([name] [place], MD)12/19/2020 at  3:15 PM

## 2020-11-22 NOTE — Telephone Encounter (Signed)
Called pt - his daughter stated he's unavailable. She stated he only sees his oncologist (besides his PCP).

## 2020-11-22 NOTE — Telephone Encounter (Signed)
Can we check to see if he has followed up with urology? If so, it would probably be best to send this to their office

## 2020-11-23 ENCOUNTER — Inpatient Hospital Stay: Payer: Medicare (Managed Care)

## 2020-11-23 ENCOUNTER — Telehealth: Payer: Self-pay

## 2020-11-23 NOTE — Telephone Encounter (Signed)
This RN called patient regarding his appointment today with no answer, voicemail was left. Patient was instructed to call infusion clinic back in order to reschedule his appointment.

## 2020-12-01 ENCOUNTER — Telehealth: Payer: Self-pay

## 2020-12-01 NOTE — Telephone Encounter (Signed)
Pt's son, [name] called to request refill for Roxicodone 5 mg. Request was routed to PA for approval. Pt's son aware.

## 2020-12-02 ENCOUNTER — Other Ambulatory Visit (HOSPITAL_COMMUNITY): Payer: Self-pay

## 2020-12-04 ENCOUNTER — Other Ambulatory Visit (HOSPITAL_COMMUNITY): Payer: Self-pay

## 2020-12-04 ENCOUNTER — Other Ambulatory Visit: Payer: Self-pay | Admitting: Hematology and Oncology

## 2020-12-05 ENCOUNTER — Other Ambulatory Visit (HOSPITAL_COMMUNITY): Payer: Self-pay

## 2020-12-05 ENCOUNTER — Other Ambulatory Visit: Payer: Self-pay | Admitting: Hematology and Oncology

## 2020-12-05 MED ORDER — OXYCODONE HCL 5 MG PO TABS
ORAL_TABLET | ORAL | 0 refills | Status: DC | PRN
  Filled 2020-12-05: qty 180, 30d supply, fill #0

## 2020-12-07 ENCOUNTER — Other Ambulatory Visit (HOSPITAL_COMMUNITY): Payer: Self-pay

## 2020-12-07 ENCOUNTER — Other Ambulatory Visit: Payer: Self-pay | Admitting: Hematology and Oncology

## 2020-12-07 MED ORDER — ABIRATERONE ACETATE 250 MG PO TABS
ORAL_TABLET | ORAL | 2 refills | Status: DC
  Filled 2020-12-16 – 2020-12-27 (×2): qty 120, 30d supply, fill #0
  Filled 2021-01-24 – 2021-02-02 (×2): qty 120, 30d supply, fill #1
  Filled 2021-02-22: qty 120, 30d supply, fill #2

## 2020-12-11 ENCOUNTER — Other Ambulatory Visit (HOSPITAL_COMMUNITY): Payer: Self-pay

## 2020-12-15 ENCOUNTER — Other Ambulatory Visit (HOSPITAL_COMMUNITY): Payer: Self-pay

## 2020-12-15 ENCOUNTER — Telehealth: Payer: Self-pay | Admitting: *Deleted

## 2020-12-15 ENCOUNTER — Other Ambulatory Visit: Payer: Self-pay | Admitting: Hematology and Oncology

## 2020-12-15 MED ORDER — OXYCODONE HCL ER 40 MG PO T12A
40.0000 mg | EXTENDED_RELEASE_TABLET | Freq: Two times a day (BID) | ORAL | 0 refills | Status: DC
  Filled 2020-12-15 – 2020-12-16 (×3): qty 60, 30d supply, fill #0

## 2020-12-15 NOTE — Telephone Encounter (Signed)
Received call from pt's adult child, [name]. She states that Mr. Lehrke needs refills of his Oxycontin 40 mg. This was last filled on 11/17/20 Please send to [facility].  She states he was c/o feeling shaky today-hands trembling. He has not had too much to eat today and has been drinking coffee and Pepsi. Advised that the caffeine and not much food could cause him to be shaky. Advised that she talk to him about eating more protein during the day and add more water to his fluid intake.  She said she would do that. She is aware of his appt in December.

## 2020-12-16 ENCOUNTER — Other Ambulatory Visit (HOSPITAL_COMMUNITY): Payer: Self-pay

## 2020-12-18 ENCOUNTER — Other Ambulatory Visit (HOSPITAL_COMMUNITY): Payer: Self-pay

## 2020-12-18 NOTE — Progress Notes (Signed)
[address]  Office Visit   Patient ID: Dustin Alvarez, male    DOB: 01/08/1956, 65 y.o.   MRN: [id number]   PCP: [name], MD   Subjective:  CC: Follow-up (ROUTINE OFFICE VISIT / MEDICATION REFILL / PAIN # 7 -LOWER BACK.), Hypertension, COPD, and Nail Problem   Dustin Alvarez is a 65 y.o. year old male who presents for follow up of the above chronic conditions. Please refer to problem based charting for assessment and plan.   ACTIVE MEDICATIONS   Outpatient Medications Prior to Visit  Medication Sig   abiraterone acetate (ZYTIGA) 250 MG tablet TAKE 4 TABLETS BY MOUTH DAILY. TAKE ON AN EMPTY STOMACH 1 HOUR BEFORE OR 2 HOURS AFTER A MEAL   albuterol (VENTOLIN HFA) 108 (90 Base) MCG/ACT inhaler Inhale 2 puffs into the lungs every 6 (six) hours as needed for wheezing or shortness of breath.   calcium-vitamin D (OSCAL WITH D) 500-200 MG-UNIT tablet Take 1 tablet by mouth daily with breakfast.   finasteride (PROSCAR) 5 MG tablet Take 1 tablet (5 mg total) by mouth daily.   gabapentin (NEURONTIN) 300 MG capsule Take 2 capsules (600 mg total) by mouth 2 (two) times daily.   oxyCODONE (OXY IR/ROXICODONE) 5 MG immediate release tablet TAKE 1 TABLET BY MOUTH EVERY 4 HOURS AS NEEDED   oxyCODONE (OXYCONTIN) 40 mg 12 hr tablet Take 1 tablet by mouth every 12 (twelve) hours.   pantoprazole (PROTONIX) 40 MG tablet Take 1 tablet (40 mg total) by mouth daily.   predniSONE (DELTASONE) 5 MG tablet Take 1 tablet (5 mg total) by mouth daily with breakfast. (do not take prior to starting Abiraterone)   senna-docusate (SENOKOT-S) 8.6-50 MG tablet TAKE 1 TABLET BY MOUTH 2 TIMES DAILY (Patient taking differently: Take 1 tablet by mouth 2 (two) times daily as needed (constipation).)   sildenafil (VIAGRA) 100 MG tablet Take 1 tablet (100 mg total) by mouth daily as needed for erectile dysfunction.   tamsulosin (FLOMAX) 0.4 MG CAPS capsule Take 1 capsule (0.4 mg total) by mouth in the morning and at bedtime.    [DISCONTINUED] amLODipine (NORVASC) 5 MG tablet Take 1 tablet (5 mg total) by mouth daily.   [DISCONTINUED] hydrochlorothiazide (MICROZIDE) 12.5 MG capsule Take 12.5 mg by mouth daily.   [DISCONTINUED] terbinafine (LAMISIL) 250 MG tablet Take 250 mg by mouth daily.   [DISCONTINUED] traZODone (DESYREL) 50 MG tablet Take 1 tablet (50 mg total) by mouth at bedtime.   [DISCONTINUED] TRELEGY ELLIPTA 100-62.5-25 MCG/INH AEPB Inhale 2 puffs into the lungs daily.   No facility-administered medications prior to visit.     Objective:   BP 137/87 (BP Location: Left Arm, Patient Position: Sitting, Cuff Size: Normal)   Pulse 88   Temp 98.1 F (36.7 C) (Oral)   Ht 5\' 11"  (1.803 m)   Wt 141 lb 14.4 oz (64.4 kg)   SpO2 95%   BMI 19.79 kg/m  Wt Readings from Last 3 Encounters:  12/19/20 141 lb 14.4 oz (64.4 kg)  11/16/20 138 lb 8 oz (62.8 kg)  11/06/20 142 lb (64.4 kg)    BP Readings from Last 3 Encounters:  12/19/20 137/87  11/16/20 117/70  11/06/20 119/70   General: Chronically ill-appearing male in no distress Cardiac: Regular rate and rhythm, extremities are warm equally Pulm: Breathing comfortably on room air.  Lung sounds are diminished throughout with prolonged expiratory phase.  Minimal wheezes appreciable. Feet: Toenails are thickened and discolored bilaterally.  No open wounds or  lesions on his feet.  No pedal edema.  Health Maintenance:   Health Maintenance  Topic Date Due   COVID-19 Vaccine (1) Never done   Hepatitis C Screening  Never done   Zoster Vaccines- Shingrix (1 of 2) Never done   COLONOSCOPY (Pts 45-70yrs Insurance coverage will need to be confirmed)  Never done   INFLUENZA VACCINE  09/11/2020   Pneumonia Vaccine 76+ Years old (2 - PPSV23 if available, else PCV20) 10/31/2020   TETANUS/TDAP  10/31/2029   HPV VACCINES  Aged Out    Assessment & Plan:   Problem List Items Addressed This Visit       Cardiovascular and Mediastinum   Hypertension (Chronic)     It appears that hydrochlorothiazide got added back after I discontinued it back in June due to him developing some orthostatic symptoms.  He notes to me that he feels it is making him dehydrated and occasionally is getting lightheaded. Blood pressure is 137/87 today. Plan: Given his lightheadedness, in combination with overall fragility and age, I would like to steer away from hydrochlorothiazide for blood pressure management so I have discontinued this again today.  Alternatively, we can increase his amlodipine to 10 mg.  Our blood pressure goal will remain less than 140/90 Note to avoid ACE inhibitors due to history of angioedema.      Relevant Medications   amLODipine (NORVASC) 10 MG tablet   PAD (peripheral artery disease) ([place])    He will return to vascular surgery if claudication symptoms worsen.      Relevant Medications   amLODipine (NORVASC) 10 MG tablet     Respiratory   COPD (chronic obstructive pulmonary disease) (HCC) (Chronic)    COPD follow up: Current medications: trelegy  Med Adherence:  [x]  Yes    []  No Symptom control: []  Yes    []  No   []  Intermediate; CAT score 8 # of exacerbations in past 19mo: 0 Smoking history: [x]  Yes    []  No  Currently smoking: [x]  Yes    []  No  ; 1/2 ppd Comments: Although his CAT score is only 8, he notes that he still feels like his symptoms are not being fully controlled.  We discussed the importance of smoking cessation.  He tells me he is down from 1 pack a day to half a pack.  He is hesitant about PFTs due to transportation issues. Plan:  We will increase his Trelegy up to 200-62.5-25 mcg.  If he still feels like his symptoms are not being well controlled, I will plan to obtain PFTs and refer him to pulmonology. Start nicotine patches Nicotrol inhaler sent to the pharmacy         Relevant Medications   Fluticasone-Umeclidin-Vilant (TRELEGY ELLIPTA) 200-62.5-25 MCG/ACT AEPB   nicotine (NICOTROL) 10 MG inhaler     Musculoskeletal  and Integument   Lumbar degenerative disc disease with radiculopathy (Chronic)    He is following with [place] [facility] for consideration of the placement of a spinal stimulator.      Onychomycosis of toenail - Primary    Toenails remain very thick and discolored despite completing a 12-week course of terbinafine.  He may need another course of terbinafine. Will refer to podiatry to assist with nail debridement given his high risk feet with PAD.      Relevant Orders   Ambulatory referral to Podiatry     Other   Insomnia    He is continuing to struggle falling and staying asleep.  We  will increase his trazodone to 100 mg nightly.  I advised him to decrease this back down to 50 mg if he feels that he is becoming unsteady on his feet at night.      Overall, he is looking a lot better since I last saw him in June.  He is still frail-appearing but less so than before.  No acute issues were raised at today's visit.  Return in about 8 weeks (around 02/16/2021) for Follow up of chronic medical conditions.  Pt discussed with Dr. Adolm Joseph, MD Internal Medicine Resident PGY-3 [facility] 12/20/2020 5:59 PM

## 2020-12-18 NOTE — Assessment & Plan Note (Addendum)
COPD follow up: Current medications: trelegy  Med Adherence:  [x]  Yes    []  No Symptom control: []  Yes    []  No   []  Intermediate; CAT score 8 # of exacerbations in past 16mo: 0 Smoking history: [x]  Yes    []  No  Currently smoking: [x]  Yes    []  No  ; 1/2 ppd Comments: Although his CAT score is only 8, he notes that he still feels like his symptoms are not being fully controlled.   We discussed the importance of smoking cessation.  He tells me he is down from 1 pack a day to half a pack.  We spent 6 minutes discussing plans for smoking cessation.   He is hesitant about PFTs due to transportation issues. Plan:   We will increase his Trelegy up to 200-62.5-25 mcg.  If he still feels like his symptoms are not being well controlled, I will plan to obtain PFTs and refer him to pulmonology.  Start nicotine patches  Nicotrol inhaler sent to the pharmacy

## 2020-12-19 ENCOUNTER — Encounter: Payer: Self-pay | Admitting: Internal Medicine

## 2020-12-19 ENCOUNTER — Other Ambulatory Visit: Payer: Self-pay

## 2020-12-19 ENCOUNTER — Ambulatory Visit (INDEPENDENT_AMBULATORY_CARE_PROVIDER_SITE_OTHER): Payer: Medicare (Managed Care) | Admitting: Internal Medicine

## 2020-12-19 VITALS — BP 137/87 | HR 88 | Temp 98.1°F | Ht 71.0 in | Wt 141.9 lb

## 2020-12-19 DIAGNOSIS — G4700 Insomnia, unspecified: Secondary | ICD-10-CM

## 2020-12-19 DIAGNOSIS — F17210 Nicotine dependence, cigarettes, uncomplicated: Secondary | ICD-10-CM | POA: Diagnosis not present

## 2020-12-19 DIAGNOSIS — M5116 Intervertebral disc disorders with radiculopathy, lumbar region: Secondary | ICD-10-CM

## 2020-12-19 DIAGNOSIS — B351 Tinea unguium: Secondary | ICD-10-CM

## 2020-12-19 DIAGNOSIS — M5136 Other intervertebral disc degeneration, lumbar region: Secondary | ICD-10-CM

## 2020-12-19 DIAGNOSIS — I739 Peripheral vascular disease, unspecified: Secondary | ICD-10-CM

## 2020-12-19 DIAGNOSIS — M51369 Other intervertebral disc degeneration, lumbar region without mention of lumbar back pain or lower extremity pain: Secondary | ICD-10-CM

## 2020-12-19 DIAGNOSIS — J449 Chronic obstructive pulmonary disease, unspecified: Secondary | ICD-10-CM

## 2020-12-19 DIAGNOSIS — I10 Essential (primary) hypertension: Secondary | ICD-10-CM

## 2020-12-19 MED ORDER — AMLODIPINE BESYLATE 10 MG PO TABS: 10.0000 mg | ORAL_TABLET | Freq: Every day | ORAL | 1 refills | Status: DC

## 2020-12-19 MED ORDER — TRAZODONE HCL 100 MG PO TABS: 50.0000 mg | ORAL_TABLET | Freq: Every day | ORAL | 1 refills | Status: DC

## 2020-12-19 MED ORDER — NICOTINE 10 MG IN INHA: 1.0000 | RESPIRATORY_TRACT | 0 refills | Status: DC | PRN

## 2020-12-19 MED ORDER — TRELEGY ELLIPTA 200-62.5-25 MCG/ACT IN AEPB: INHALATION_SPRAY | RESPIRATORY_TRACT | 3 refills | Status: DC

## 2020-12-19 NOTE — Patient Instructions (Signed)
COPD -Increased Trelegy dose -Continue to work on smoking cessation. I have ordered the Nicitrol inhaler for you   Blood pressure -Stop hydrochlorothiazide -Increase amlodipine to 10mg   Sleep -Increase Trazodone to 100mg 

## 2020-12-20 ENCOUNTER — Encounter: Payer: Self-pay | Admitting: Internal Medicine

## 2020-12-20 ENCOUNTER — Encounter: Payer: Self-pay | Admitting: Hematology and Oncology

## 2020-12-20 DIAGNOSIS — T451X5A Adverse effect of antineoplastic and immunosuppressive drugs, initial encounter: Secondary | ICD-10-CM | POA: Insufficient documentation

## 2020-12-20 DIAGNOSIS — G620 Drug-induced polyneuropathy: Secondary | ICD-10-CM | POA: Insufficient documentation

## 2020-12-20 NOTE — Assessment & Plan Note (Addendum)
It appears that hydrochlorothiazide got added back after I discontinued it back in June due to him developing some orthostatic symptoms.  He notes to me that he feels it is making him dehydrated and occasionally is getting lightheaded. Blood pressure is 137/87 today. Plan: Given his lightheadedness, in combination with overall fragility and age, I would like to steer away from hydrochlorothiazide for blood pressure management so I have discontinued this again today.  Alternatively, we can increase his amlodipine to 10 mg.  Our blood pressure goal will remain less than 140/90 Note to avoid ACE inhibitors due to history of angioedema.

## 2020-12-20 NOTE — Assessment & Plan Note (Signed)
He is following with [place] [facility] for consideration of the placement of a spinal stimulator.

## 2020-12-20 NOTE — Assessment & Plan Note (Signed)
He is continuing to struggle falling and staying asleep.  We will increase his trazodone to 100 mg nightly.  I advised him to decrease this back down to 50 mg if he feels that he is becoming unsteady on his feet at night.

## 2020-12-20 NOTE — Assessment & Plan Note (Signed)
Toenails remain very thick and discolored despite completing a 12-week course of terbinafine.  He may need another course of terbinafine. Will refer to podiatry to assist with nail debridement given his high risk feet with PAD.

## 2020-12-20 NOTE — Assessment & Plan Note (Signed)
He will return to vascular surgery if claudication symptoms worsen.

## 2020-12-21 NOTE — Progress Notes (Signed)
Internal Medicine Clinic Attending  Case discussed with Dr. Christian  At the time of the visit.  We reviewed the resident's history and exam and pertinent patient test results.  I agree with the assessment, diagnosis, and plan of care documented in the resident's note.  

## 2020-12-25 ENCOUNTER — Ambulatory Visit: Payer: Self-pay | Admitting: Podiatry

## 2020-12-27 ENCOUNTER — Other Ambulatory Visit (HOSPITAL_COMMUNITY): Payer: Self-pay

## 2021-01-01 ENCOUNTER — Other Ambulatory Visit: Payer: Self-pay

## 2021-01-01 DIAGNOSIS — I739 Peripheral vascular disease, unspecified: Secondary | ICD-10-CM

## 2021-01-08 ENCOUNTER — Other Ambulatory Visit (HOSPITAL_COMMUNITY): Payer: Self-pay

## 2021-01-08 ENCOUNTER — Other Ambulatory Visit: Payer: Self-pay | Admitting: Hematology and Oncology

## 2021-01-08 MED ORDER — OXYCODONE HCL 5 MG PO TABS
ORAL_TABLET | ORAL | 0 refills | Status: DC | PRN
  Filled 2021-01-08: qty 180, 30d supply, fill #0

## 2021-01-09 ENCOUNTER — Other Ambulatory Visit (HOSPITAL_COMMUNITY): Payer: Self-pay

## 2021-01-09 ENCOUNTER — Telehealth: Payer: Self-pay

## 2021-01-10 ENCOUNTER — Other Ambulatory Visit (HOSPITAL_COMMUNITY): Payer: Self-pay

## 2021-01-11 ENCOUNTER — Inpatient Hospital Stay: Payer: Medicare (Managed Care) | Admitting: Hematology and Oncology

## 2021-01-11 ENCOUNTER — Inpatient Hospital Stay: Payer: Medicare (Managed Care)

## 2021-01-15 ENCOUNTER — Other Ambulatory Visit: Payer: Self-pay | Admitting: Student

## 2021-01-15 DIAGNOSIS — I10 Essential (primary) hypertension: Secondary | ICD-10-CM

## 2021-01-16 ENCOUNTER — Other Ambulatory Visit: Payer: Self-pay | Admitting: Internal Medicine

## 2021-01-17 ENCOUNTER — Other Ambulatory Visit: Payer: Self-pay

## 2021-01-17 ENCOUNTER — Inpatient Hospital Stay: Payer: Medicare (Managed Care) | Attending: Hematology and Oncology

## 2021-01-17 ENCOUNTER — Inpatient Hospital Stay (HOSPITAL_BASED_OUTPATIENT_CLINIC_OR_DEPARTMENT_OTHER): Payer: Medicare (Managed Care) | Admitting: Hematology and Oncology

## 2021-01-17 VITALS — BP 142/74 | HR 99 | Temp 96.8°F | Resp 18 | Wt 140.6 lb

## 2021-01-17 DIAGNOSIS — N522 Drug-induced erectile dysfunction: Secondary | ICD-10-CM

## 2021-01-17 DIAGNOSIS — C61 Malignant neoplasm of prostate: Secondary | ICD-10-CM | POA: Diagnosis not present

## 2021-01-17 DIAGNOSIS — M79604 Pain in right leg: Secondary | ICD-10-CM

## 2021-01-17 DIAGNOSIS — I10 Essential (primary) hypertension: Secondary | ICD-10-CM

## 2021-01-17 DIAGNOSIS — F17210 Nicotine dependence, cigarettes, uncomplicated: Secondary | ICD-10-CM | POA: Diagnosis not present

## 2021-01-17 DIAGNOSIS — N529 Male erectile dysfunction, unspecified: Secondary | ICD-10-CM | POA: Insufficient documentation

## 2021-01-17 DIAGNOSIS — Z79899 Other long term (current) drug therapy: Secondary | ICD-10-CM | POA: Diagnosis not present

## 2021-01-17 DIAGNOSIS — Z7952 Long term (current) use of systemic steroids: Secondary | ICD-10-CM | POA: Diagnosis not present

## 2021-01-17 DIAGNOSIS — C7951 Secondary malignant neoplasm of bone: Secondary | ICD-10-CM | POA: Insufficient documentation

## 2021-01-17 DIAGNOSIS — M79605 Pain in left leg: Secondary | ICD-10-CM

## 2021-01-17 LAB — CBC WITH DIFFERENTIAL (CANCER CENTER ONLY)
Abs Immature Granulocytes: 0.02 10*3/uL (ref 0.00–0.07)
Basophils Absolute: 0 10*3/uL (ref 0.0–0.1)
Basophils Relative: 0 %
Eosinophils Absolute: 0.1 10*3/uL (ref 0.0–0.5)
Eosinophils Relative: 1 %
HCT: 31.1 % — ABNORMAL LOW (ref 39.0–52.0)
Hemoglobin: 9.6 g/dL — ABNORMAL LOW (ref 13.0–17.0)
Immature Granulocytes: 0 %
Lymphocytes Relative: 11 %
Lymphs Abs: 0.9 10*3/uL (ref 0.7–4.0)
MCH: 22.6 pg — ABNORMAL LOW (ref 26.0–34.0)
MCHC: 30.9 g/dL (ref 30.0–36.0)
MCV: 73.3 fL — ABNORMAL LOW (ref 80.0–100.0)
Monocytes Absolute: 0.8 10*3/uL (ref 0.1–1.0)
Monocytes Relative: 11 %
Neutro Abs: 5.7 10*3/uL (ref 1.7–7.7)
Neutrophils Relative %: 77 %
Platelet Count: 393 10*3/uL (ref 150–400)
RBC: 4.24 MIL/uL (ref 4.22–5.81)
RDW: 18.6 % — ABNORMAL HIGH (ref 11.5–15.5)
WBC Count: 7.4 10*3/uL (ref 4.0–10.5)
nRBC: 0 % (ref 0.0–0.2)

## 2021-01-17 LAB — CMP (CANCER CENTER ONLY)
ALT: 6 U/L (ref 0–44)
AST: 9 U/L — ABNORMAL LOW (ref 15–41)
Albumin: 3.6 g/dL (ref 3.5–5.0)
Alkaline Phosphatase: 87 U/L (ref 38–126)
Anion gap: 10 (ref 5–15)
BUN: 14 mg/dL (ref 8–23)
CO2: 28 mmol/L (ref 22–32)
Calcium: 8.9 mg/dL (ref 8.9–10.3)
Chloride: 103 mmol/L (ref 98–111)
Creatinine: 0.83 mg/dL (ref 0.61–1.24)
GFR, Estimated: >60 mL/min (ref >60)
Glucose, Bld: 102 mg/dL — ABNORMAL HIGH (ref 70–99)
Potassium: 3.6 mmol/L (ref 3.5–5.1)
Sodium: 141 mmol/L (ref 135–145)
Total Bilirubin: 0.3 mg/dL (ref 0.3–1.2)
Total Protein: 7.5 g/dL (ref 6.5–8.1)

## 2021-01-17 MED ORDER — AMLODIPINE BESYLATE 5 MG PO TABS: 5.0000 mg | ORAL_TABLET | Freq: Every day | ORAL | 1 refills | Status: DC

## 2021-01-17 MED ORDER — OXYCODONE HCL ER 40 MG PO T12A: 40.0000 mg | EXTENDED_RELEASE_TABLET | Freq: Two times a day (BID) | ORAL | 0 refills | Status: DC

## 2021-01-17 NOTE — Progress Notes (Signed)
[place] [phone]   [phone]  PROGRESS NOTE  Patient Care Team: [name], MD as PCP - General (Internal Medicine) Cira Rue, RN Nurse Navigator as Registered Nurse (Medical Oncology)  Hematological/Oncological History # Metastatic Castrate Sensitive Prostate Cancer, Metastatic to Bone 1) 07/13/2019: Abdomen/Pelvis CT extensive lytic changes with superimposed pathological fractures. Lytic change noted in the right iliac bone and new lucencies in the right T10 vertebrae.  2) 07/19/2019: biopsy of sacral mass shows metastatic prostatic adenocarcinoma, Gleason 4+4.  3) 07/2019: reportedly received Zometa 4g IV and eligard 22.5. Started on Casodex.  4) 6/10-6/16/2021: received palliative radiation to the sacrum at [place]. Received 2000cGy over 6 days 5) Moved to [place]. Lost to follow up from [facility] in [place], [place]. 6) 11/05/2019: establish care with Dr. Lorenso Courier  7) 11/16/2019: Administered Zometa 67m IV and Lupron 22.5 mg 8) 12/08/2019: started abiraterone 10070mPO daily  9) 03/09/2020: Administered Zometa 44m27mV and Lupron 22.5 mg 10) 06/01/2020: Administered Zometa 44mg45m and Lupron 22.5 mg 11) No show for interval zometa/lupron 12) 01/17/2021: Administered Zometa 44mg 344mand Lupron 22.5 mg  Interval History:  Dustin Alvarez.65 male with medical history significant for metastatic prostate cancer who presents for a follow up visit. The patient's last visit was on 11/16/2020. In the interim since the last visit he has had no major changes in his health.   On exam today Dustin Alvarez he has been well overall in the interim since her last visit.  He continues to struggle with back and leg pain.  He reports that his pain is about a 7-10 in severity today.  He continues to have issues with his toes "freezing up".  He notes that he is having some swelling of his ankles as well.  He has been compliant with his Zytiga  therapy and reports that he has not had any clear side effects as result of this medication.  He did not like the higher dose of the amlodipine blood pressure medication and therefore he self discontinued the 10 mg and restarted 5 mg.  He denies any dizziness or lightheadedness but reports it caused trouble with his "nerves".  He notes his appetite is good but he does still have his "down days".  He otherwise denies any fevers, chills, sweats, nausea, vomiting or diarrhea.  Full 10 point ROS is listed below.    MEDICAL HISTORY:  Past Medical History:  Diagnosis Date   Asthma    COPD (chronic obstructive pulmonary disease) (HCC)    GERD (gastroesophageal reflux disease)    Hypertension    Prostate cancer (HCC) [place] SURGICAL HISTORY: Past Surgical History:  Procedure Laterality Date   ABDOMINAL AORTOGRAM W/LOWER EXTREMITY N/A 10/26/2020   Procedure: ABDOMINAL AORTOGRAM W/LOWER EXTREMITY;  Surgeon: ClarkMarty Heck  [facility];  Service: Cardiovascular;  Laterality: N/A;   WRIST SURGERY Left 2017    SOCIAL HISTORY: Social History   Socioeconomic History   Marital status: Widowed    Spouse name: Not on file   Number of children: Not on file   Years of education: Not on file   Highest education level: Not on file  Occupational History   Not on file  Tobacco Use   Smoking status: Every Day    Packs/day: 1.00    Types: Cigarettes   Smokeless tobacco: Never   Tobacco comments:    almost 1 pkd. Wants patches  Vaping Use   Vaping Use: Never used  Substance and Sexual Activity   Alcohol use: Yes    Comment: 1-2 drinks per week.   Drug use: Not Currently   Sexual activity: Not Currently  Other Topics Concern   Not on file  Social History Narrative   Not on file   Social Determinants of Health   Financial Resource Strain: Not on file  Food Insecurity: Not on file  Transportation Needs: Not on file  Physical Activity: Not on file  Stress: Not on  file  Social Connections: Not on file  Intimate Partner Violence: Not on file    FAMILY HISTORY: Family History  Problem Relation Age of Onset   COPD Father    Diabetes Sister    Diabetes Brother    Breast cancer Neg Hx    Prostate cancer Neg Hx    Colon cancer Neg Hx    Pancreatic cancer Neg Hx     ALLERGIES:  is allergic to ace inhibitors and lisinopril.  MEDICATIONS:  Current Outpatient Medications  Medication Sig Dispense Refill   amLODipine (NORVASC) 5 MG tablet Take 1 tablet (5 mg total) by mouth daily. 90 tablet 1   tamsulosin (FLOMAX) 0.4 MG CAPS capsule Take 1 capsule (0.4 mg total) by mouth in the morning and at bedtime. 180 capsule 0   abiraterone acetate (ZYTIGA) 250 MG tablet TAKE 4 TABLETS BY MOUTH DAILY. TAKE ON AN EMPTY STOMACH 1 HOUR BEFORE OR 2 HOURS AFTER A MEAL 120 tablet 2   albuterol (VENTOLIN HFA) 108 (90 Base) MCG/ACT inhaler Inhale 2 puffs into the lungs every 6 (six) hours as needed for wheezing or shortness of breath. 8.5 g 3   calcium-vitamin D (OSCAL WITH D) 500-200 MG-UNIT tablet Take 1 tablet by mouth daily with breakfast. 90 tablet 3   finasteride (PROSCAR) 5 MG tablet Take 1 tablet (5 mg total) by mouth daily. 90 tablet 1   Fluticasone-Umeclidin-Vilant (TRELEGY ELLIPTA) 200-62.5-25 MCG/ACT AEPB Use twice daily 1 each 3   gabapentin (NEURONTIN) 300 MG capsule Take 2 capsules (600 mg total) by mouth 2 (two) times daily. 120 capsule 2   nicotine (NICOTROL) 10 MG inhaler Inhale 1 Cartridge (1 continuous puffing total) into the lungs as needed for smoking cessation. 42 each 0   oxyCODONE (OXY IR/ROXICODONE) 5 MG immediate release tablet TAKE 1 TABLET BY MOUTH EVERY 4 HOURS AS NEEDED 180 tablet 0   oxyCODONE (OXYCONTIN) 40 mg 12 hr tablet Take 1 tablet by mouth every 12 (twelve) hours. 60 tablet 0   pantoprazole (PROTONIX) 40 MG tablet Take 1 tablet (40 mg total) by mouth daily. 30 tablet 3   predniSONE (DELTASONE) 5 MG tablet Take 1 tablet (5 mg total) by  mouth daily with breakfast. (do not take prior to starting Abiraterone) 90 tablet 1   senna-docusate (SENOKOT-S) 8.6-50 MG tablet TAKE 1 TABLET BY MOUTH 2 TIMES DAILY (Patient taking differently: Take 1 tablet by mouth 2 (two) times daily as needed (constipation).) 60 tablet 1   sildenafil (VIAGRA) 100 MG tablet Take 1 tablet (100 mg total) by mouth daily as needed for erectile dysfunction. 30 tablet 0   traZODone (DESYREL) 100 MG tablet Take 0.5 tablets (50 mg total) by mouth at bedtime. 90 tablet 1   No current facility-administered medications for this visit.    REVIEW OF SYSTEMS:   Constitutional: ( - ) fevers, ( - )  chills , ( - ) night sweats Eyes: ( - ) blurriness  of vision, ( - ) double vision, ( - ) watery eyes Ears, nose, mouth, throat, and face: ( - ) mucositis, ( - ) sore throat Respiratory: ( - ) cough, ( - ) dyspnea, ( - ) wheezes Cardiovascular: ( - ) palpitation, ( - ) chest discomfort, ( - ) lower extremity swelling Gastrointestinal:  ( - ) nausea, ( - ) heartburn, ( - ) change in bowel habits Skin: ( - ) abnormal skin rashes Lymphatics: ( - ) new lymphadenopathy, ( - ) easy bruising Neurological: ( - ) numbness, ( - ) tingling, ( - ) new weaknesses Behavioral/Psych: ( - ) mood change, ( - ) new changes  All other systems were reviewed with the patient and are negative.  PHYSICAL EXAMINATION:  Vitals:   01/17/21 1028  BP: (!) 142/74  Pulse: 99  Resp: 18  Temp: (!) 96.8 F (36 C)  SpO2: 92%    Filed Weights   01/17/21 1028  Weight: 140 lb 9.6 oz (63.8 kg)     GENERAL: well appearing elderly African American male in NAD. SKIN: skin color, texture, turgor are normal, no rashes or significant lesions EYES: conjunctiva are pink and non-injected, sclera clear LUNGS: clear to auscultation and percussion with normal breathing effort HEART: regular rate & rhythm and no murmurs and no lower extremity edema Musculoskeletal: no cyanosis of digits and no clubbing   PSYCH: alert & oriented x 3, fluent speech NEURO: no focal motor/sensory deficits  LABORATORY DATA:  I have reviewed the data as listed CBC Latest Ref Rng & Units 01/17/2021 11/16/2020 11/06/2020  WBC 4.0 - 10.5 K/uL 7.4 5.4 5.3  Hemoglobin 13.0 - 17.0 g/dL 9.6(L) 10.0(L) 10.5(L)  Hematocrit 39.0 - 52.0 % 31.1(L) 31.3(L) 33.2(L)  Platelets 150 - 400 K/uL 393 375 389    CMP Latest Ref Rng & Units 11/16/2020 11/06/2020 10/26/2020  Glucose 70 - 99 mg/dL 133(H) 126(H) 97  BUN 8 - 23 mg/dL 15 10 12   Creatinine 0.61 - 1.24 mg/dL 0.83 0.76 0.60(L)  Sodium 135 - 145 mmol/L 142 139 140  Potassium 3.5 - 5.1 mmol/L 4.2 3.2(L) 3.1(L)  Chloride 98 - 111 mmol/L 106 100 98  CO2 22 - 32 mmol/L 28 30 -  Calcium 8.9 - 10.3 mg/dL 9.5 9.1 -  Total Protein 6.5 - 8.1 g/dL 7.6 7.4 -  Total Bilirubin 0.3 - 1.2 mg/dL 0.3 0.3 -  Alkaline Phos 38 - 126 U/L 92 79 -  AST 15 - 41 U/L 9(L) 15 -  ALT 0 - 44 U/L 7 9 -    RADIOGRAPHIC STUDIES: No results found.  ASSESSMENT & PLAN Dustin Alvarez 65 y.o. male with medical history significant for metastatic prostate cancer who presents for a follow up visit.  Dustin Alvarez has excessively transitioned off the Casodex and has received his first dosages of Zometa and Lupron.  His PSA continues to trend downward and is testosterone is at castrate level.  Given this we started abiraterone therapy 1000 mg p.o. daily with 5 mg of prednisone.   The major symptom the patient has been experiencing has been pain.  His spinal sacral lesion has been poorly controlled on oxycodone therapy.  He did previously receive radiation therapy in June 2021, but due to continued excruciating pain we asked for him to be reexamined by radiation oncology to see if there is any further intervention they can offer at this time. He has had further palliative radiation, though this has not relieved his  pain. Continue OxyContin to 30 mg twice daily (per his request, issues with constipation at higher  doses) and additionally continue his as needed fast acting oxycodone. We will continue gabapentin at 338m BID.  Due to the need for monitoring of abiraterone we will plan to have him back in 8 weeks for clinic visit.  PSA 07/13/2019: PSA 819 08/27/2019: 226 11/05/2019: 44.8 12/08/2019: 27.3 12/27/2019: 21.2  02/24/2020: 12.1 03/22/2020: PSA 8.8 05/18/2020: PSA 4.1 07/31/2020: PSA 2.8 11/16/2020: PSA 1.6  # Metastatic Castrate Sensitive Prostate Cancer, Metastatic to Bone --findings are most consistent with metastatic adenocarcinoma of the prostate.  --testosterone at last visit was <3, PSA down to 1.6 ( from 819 at diagnosis)  --Administered Zometa 456mIV and Lupron 22.5 mg today. Continue q 3 months.  --continue abiraterone 100064mith prednisone 5mg90m daily. Assure he is compliant with the full dose and steroids.  --patient due for a repeat CT scan and NM bone scan in Feb 2023. Repeat q 6 months or based on PSA response.  --RTC in 12 weeks for continued monitoring on Abiraterone therapy +zometa/lupron.   # Erectile Dysfunction --patient trialed on cialis and Viagra with sub-optimal results --patient requesting "something stronger" --referred to urology for assistance in managing his ED.    #Symptom Management --patient requested a refill of viagra due to issues with ED  --gabapentin 600 mg BID to help with pain.  --continue oxycodone 5-10mg66m PRN for pain control. --continue oxycontin 30mg 93m for basal pain control --established care with Radiation Oncology to help with painful bone lesions. Radiation therapy completed 02/01/2021.  --seen by neurosurgery, provided injections with no relief.  -- Met with vascular surgery regarding the concern for poor circulation of the lower extremities.  He declined operative management.  They plan to see him back to monitor ABIs  No orders of the defined types were placed in this encounter.   All questions were answered. The patient knows to  call the clinic with any problems, questions or concerns.  A total of more than 30 minutes were spent on this encounter and over half of that time was spent on counseling and coordination of care as outlined above.   Dustin Alvarez TLedell Peoplesepartment of Hematology/Oncology [facility]: [phone]: [phone]: [email or web address]  01/17/2021 10:46 AM

## 2021-01-18 ENCOUNTER — Other Ambulatory Visit (HOSPITAL_COMMUNITY): Payer: Self-pay

## 2021-01-18 ENCOUNTER — Inpatient Hospital Stay: Payer: Medicare (Managed Care)

## 2021-01-18 ENCOUNTER — Other Ambulatory Visit: Payer: Self-pay | Admitting: Physician Assistant

## 2021-01-18 ENCOUNTER — Other Ambulatory Visit: Payer: Self-pay | Admitting: Hematology and Oncology

## 2021-01-18 ENCOUNTER — Telehealth: Payer: Self-pay | Admitting: *Deleted

## 2021-01-18 VITALS — BP 124/62 | HR 91 | Temp 97.9°F | Resp 18

## 2021-01-18 DIAGNOSIS — C61 Malignant neoplasm of prostate: Secondary | ICD-10-CM | POA: Diagnosis not present

## 2021-01-18 MED ORDER — ZOLEDRONIC ACID 4 MG/100ML IV SOLN
4.0000 mg | Freq: Once | INTRAVENOUS | Status: AC
  Administered 2021-01-18: 4 mg via INTRAVENOUS
  Filled 2021-01-18: qty 100

## 2021-01-18 MED ORDER — LEUPROLIDE ACETATE (3 MONTH) 22.5 MG ~~LOC~~ KIT
22.5000 mg | PACK | Freq: Once | SUBCUTANEOUS | Status: AC
  Administered 2021-01-18: 22.5 mg via SUBCUTANEOUS
  Filled 2021-01-18: qty 22.5

## 2021-01-18 MED ORDER — OXYCODONE HCL ER 40 MG PO T12A
40.0000 mg | EXTENDED_RELEASE_TABLET | Freq: Two times a day (BID) | ORAL | 0 refills | Status: DC
  Filled 2021-01-18: qty 54, 27d supply, fill #0
  Filled 2021-01-18: qty 6, 3d supply, fill #0

## 2021-01-18 NOTE — Patient Instructions (Signed)
Leuprolide depot injection What is this medication? LEUPROLIDE (loo PROE lide) is a man-made protein that acts like a natural hormone in the body. It decreases testosterone in men and decreases estrogen in women. In men, this medicine is used to treat advanced prostate cancer. In women, some forms of this medicine may be used to treat endometriosis, uterine fibroids, or other male hormone-related problems. This medicine may be used for other purposes; ask your health care provider or pharmacist if you have questions. COMMON BRAND NAME(S): Eligard, Fensolv, Lupron Depot, Lupron Depot-Ped, Viadur What should I tell my care team before I take this medication? They need to know if you have any of these conditions: diabetes heart disease or previous heart attack high blood pressure high cholesterol mental illness osteoporosis pain or difficulty passing urine seizures spinal cord metastasis stroke suicidal thoughts, plans, or attempt; a previous suicide attempt by you or a family member tobacco smoker unusual vaginal bleeding (women) an unusual or allergic reaction to leuprolide, benzyl alcohol, other medicines, foods, dyes, or preservatives pregnant or trying to get pregnant breast-feeding How should I use this medication? This medicine is for injection into a muscle or for injection under the skin. It is given by a health care professional in a hospital or clinic setting. The specific product will determine how it will be given to you. Make sure you understand which product you receive and how often you will receive it. Talk to your pediatrician regarding the use of this medicine in children. Special care may be needed. Overdosage: If you think you have taken too much of this medicine contact a poison control center or emergency room at once. NOTE: This medicine is only for you. Do not share this medicine with others. What if I miss a dose? It is important not to miss a dose. Call your  doctor or health care professional if you are unable to keep an appointment. Depot injections: Depot injections are given either once-monthly, every 12 weeks, every 16 weeks, or every 24 weeks depending on the product you are prescribed. The product you are prescribed will be based on if you are male or male, and your condition. Make sure you understand your product and dosing. What may interact with this medication? Do not take this medicine with any of the following medications: chasteberry cisapride dronedarone pimozide thioridazine This medicine may also interact with the following medications: herbal or dietary supplements, like black cohosh or DHEA male hormones, like estrogens or progestins and birth control pills, patches, rings, or injections male hormones, like testosterone other medicines that prolong the QT interval (abnormal heart rhythm) This list may not describe all possible interactions. Give your health care provider a list of all the medicines, herbs, non-prescription drugs, or dietary supplements you use. Also tell them if you smoke, drink alcohol, or use illegal drugs. Some items may interact with your medicine. What should I watch for while using this medication? Visit your doctor or health care professional for regular checks on your progress. During the first weeks of treatment, your symptoms may get worse, but then will improve as you continue your treatment. You may get hot flashes, increased bone pain, increased difficulty passing urine, or an aggravation of nerve symptoms. Discuss these effects with your doctor or health care professional, some of them may improve with continued use of this medicine. Male patients may experience a menstrual cycle or spotting during the first months of therapy with this medicine. If this continues, contact your doctor or  health care professional. This medicine may increase blood sugar. Ask your healthcare provider if changes in diet  or medicines are needed if you have diabetes. What side effects may I notice from receiving this medication? Side effects that you should report to your doctor or health care professional as soon as possible: allergic reactions like skin rash, itching or hives, swelling of the face, lips, or tongue breathing problems chest pain depression or memory disorders pain in your legs or groin pain at site where injected or implanted seizures severe headache signs and symptoms of high blood sugar such as being more thirsty or hungry or having to urinate more than normal. You may also feel very tired or have blurry vision swelling of the feet and legs suicidal thoughts or other mood changes visual changes vomiting Side effects that usually do not require medical attention (report to your doctor or health care professional if they continue or are bothersome): breast swelling or tenderness decrease in sex drive or performance diarrhea hot flashes loss of appetite muscle, joint, or bone pains nausea redness or irritation at site where injected or implanted skin problems or acne This list may not describe all possible side effects. Call your doctor for medical advice about side effects. You may report side effects to FDA at 1-800-FDA-1088. Where should I keep my medication? This drug is given in a hospital or clinic and will not be stored at home. NOTE: This sheet is a summary. It may not cover all possible information. If you have questions about this medicine, talk to your doctor, pharmacist, or health care provider.  2022 Elsevier/Gold Standard (2020-10-17 00:00:00)   Zoledronic Acid Injection (Hypercalcemia, Oncology) What is this medication? ZOLEDRONIC ACID (ZOE le dron ik AS id) slows calcium loss from bones. It high calcium levels in the blood from some kinds of cancer. It may be used in other people at risk for bone loss. This medicine may be used for other purposes; ask your health  care provider or pharmacist if you have questions. COMMON BRAND NAME(S): Zometa What should I tell my care team before I take this medication? They need to know if you have any of these conditions: cancer dehydration dental disease kidney disease liver disease low levels of calcium in the blood lung or breathing disease (asthma) receiving steroids like dexamethasone or prednisone an unusual or allergic reaction to zoledronic acid, other medicines, foods, dyes, or preservatives pregnant or trying to get pregnant breast-feeding How should I use this medication? This drug is injected into a vein. It is given by a health care provider in a hospital or clinic setting. Talk to your health care provider about the use of this drug in children. Special care may be needed. Overdosage: If you think you have taken too much of this medicine contact a poison control center or emergency room at once. NOTE: This medicine is only for you. Do not share this medicine with others. What if I miss a dose? Keep appointments for follow-up doses. It is important not to miss your dose. Call your health care provider if you are unable to keep an appointment. What may interact with this medication? certain antibiotics given by injection NSAIDs, medicines for pain and inflammation, like ibuprofen or naproxen some diuretics like bumetanide, furosemide teriparatide thalidomide This list may not describe all possible interactions. Give your health care provider a list of all the medicines, herbs, non-prescription drugs, or dietary supplements you use. Also tell them if you smoke, drink alcohol, or  use illegal drugs. Some items may interact with your medicine. What should I watch for while using this medication? Visit your health care provider for regular checks on your progress. It may be some time before you see the benefit from this drug. Some people who take this drug have severe bone, joint, or muscle pain. This  drug may also increase your risk for jaw problems or a broken thigh bone. Tell your health care provider right away if you have severe pain in your jaw, bones, joints, or muscles. Tell you health care provider if you have any pain that does not go away or that gets worse. Tell your dentist and dental surgeon that you are taking this drug. You should not have major dental surgery while on this drug. See your dentist to have a dental exam and fix any dental problems before starting this drug. Take good care of your teeth while on this drug. Make sure you see your dentist for regular follow-up appointments. You should make sure you get enough calcium and vitamin D while you are taking this drug. Discuss the foods you eat and the vitamins you take with your health care provider. Check with your health care provider if you have severe diarrhea, nausea, and vomiting, or if you sweat a lot. The loss of too much body fluid may make it dangerous for you to take this drug. You may need blood work done while you are taking this drug. Do not become pregnant while taking this drug. Women should inform their health care provider if they wish to become pregnant or think they might be pregnant. There is potential for serious harm to an unborn child. Talk to your health care provider for more information. What side effects may I notice from receiving this medication? Side effects that you should report to your doctor or health care provider as soon as possible: allergic reactions (skin rash, itching or hives; swelling of the face, lips, or tongue) bone pain infection (fever, chills, cough, sore throat, pain or trouble passing urine) jaw pain, especially after dental work joint pain kidney injury (trouble passing urine or change in the amount of urine) low blood pressure (dizziness; feeling faint or lightheaded, falls; unusually weak or tired) low calcium levels (fast heartbeat; muscle cramps or pain; pain, tingling,  or numbness in the hands or feet; seizures) low magnesium levels (fast, irregular heartbeat; muscle cramp or pain; muscle weakness; tremors; seizures) low red blood cell counts (trouble breathing; feeling faint; lightheaded, falls; unusually weak or tired) muscle pain redness, blistering, peeling, or loosening of the skin, including inside the mouth severe diarrhea swelling of the ankles, feet, hands trouble breathing Side effects that usually do not require medical attention (report to your doctor or health care provider if they continue or are bothersome): anxious constipation coughing depressed mood eye irritation, itching, or pain fever general ill feeling or flu-like symptoms nausea pain, redness, or irritation at site where injected trouble sleeping This list may not describe all possible side effects. Call your doctor for medical advice about side effects. You may report side effects to FDA at 1-800-FDA-1088. Where should I keep my medication? This drug is given in a hospital or clinic. It will not be stored at home. NOTE: This sheet is a summary. It may not cover all possible information. If you have questions about this medicine, talk to your doctor, pharmacist, or health care provider.  2022 Elsevier/Gold Standard (2020-10-17 00:00:00)

## 2021-01-18 NOTE — Telephone Encounter (Signed)
Received call from pt requesting refill of his long acting oxycontin. It appears it was lst filled last month, so it is time for refill. The prescription needs to go to [facility].  Dr. Lorenso Courier aware.

## 2021-01-19 ENCOUNTER — Other Ambulatory Visit (HOSPITAL_COMMUNITY): Payer: Self-pay

## 2021-01-24 ENCOUNTER — Other Ambulatory Visit (HOSPITAL_COMMUNITY): Payer: Self-pay

## 2021-01-26 ENCOUNTER — Other Ambulatory Visit (HOSPITAL_COMMUNITY): Payer: Self-pay

## 2021-02-01 ENCOUNTER — Other Ambulatory Visit (HOSPITAL_COMMUNITY): Payer: Self-pay

## 2021-02-02 ENCOUNTER — Other Ambulatory Visit (HOSPITAL_COMMUNITY): Payer: Self-pay

## 2021-02-06 ENCOUNTER — Other Ambulatory Visit: Payer: Self-pay | Admitting: Hematology and Oncology

## 2021-02-06 ENCOUNTER — Other Ambulatory Visit (HOSPITAL_COMMUNITY): Payer: Self-pay

## 2021-02-06 ENCOUNTER — Ambulatory Visit: Payer: Medicare (Managed Care) | Admitting: Vascular Surgery

## 2021-02-06 ENCOUNTER — Ambulatory Visit (HOSPITAL_COMMUNITY): Payer: Medicare (Managed Care) | Attending: Vascular Surgery

## 2021-02-07 ENCOUNTER — Other Ambulatory Visit (HOSPITAL_COMMUNITY): Payer: Self-pay

## 2021-02-07 ENCOUNTER — Encounter: Payer: Self-pay | Admitting: Hematology and Oncology

## 2021-02-07 MED ORDER — OXYCODONE HCL 5 MG PO TABS
ORAL_TABLET | ORAL | 0 refills | Status: DC | PRN
  Filled 2021-02-07: qty 180, 30d supply, fill #0

## 2021-02-08 ENCOUNTER — Other Ambulatory Visit: Payer: Self-pay | Admitting: Internal Medicine

## 2021-02-19 ENCOUNTER — Other Ambulatory Visit: Payer: Self-pay | Admitting: Physician Assistant

## 2021-02-19 ENCOUNTER — Other Ambulatory Visit (HOSPITAL_COMMUNITY): Payer: Self-pay

## 2021-02-20 ENCOUNTER — Other Ambulatory Visit: Payer: Self-pay | Admitting: Hematology and Oncology

## 2021-02-20 ENCOUNTER — Telehealth: Payer: Self-pay | Admitting: *Deleted

## 2021-02-20 ENCOUNTER — Other Ambulatory Visit: Payer: Self-pay | Admitting: Student

## 2021-02-20 ENCOUNTER — Other Ambulatory Visit: Payer: Self-pay | Admitting: Physician Assistant

## 2021-02-20 ENCOUNTER — Other Ambulatory Visit (HOSPITAL_COMMUNITY): Payer: Self-pay

## 2021-02-20 ENCOUNTER — Encounter: Payer: Self-pay | Admitting: Hematology and Oncology

## 2021-02-20 DIAGNOSIS — I10 Essential (primary) hypertension: Secondary | ICD-10-CM

## 2021-02-20 MED ORDER — OXYCODONE HCL ER 40 MG PO T12A
40.0000 mg | EXTENDED_RELEASE_TABLET | Freq: Two times a day (BID) | ORAL | 0 refills | Status: DC
  Filled 2021-02-20 – 2021-02-21 (×3): qty 60, 30d supply, fill #0

## 2021-02-20 MED ORDER — OXYCODONE HCL 5 MG PO TABS
ORAL_TABLET | ORAL | 0 refills | Status: DC | PRN
  Filled 2021-02-20 – 2021-03-08 (×2): qty 180, 30d supply, fill #0

## 2021-02-20 NOTE — Telephone Encounter (Signed)
Received refill request.  Patient is completely out of Oxycodone 40 mg q 12 hr.   Requesting refill to be sent to [facility].   Routed to MD to review.

## 2021-02-21 ENCOUNTER — Other Ambulatory Visit (HOSPITAL_COMMUNITY): Payer: Self-pay

## 2021-02-22 ENCOUNTER — Other Ambulatory Visit: Payer: Self-pay | Admitting: Hematology and Oncology

## 2021-02-22 ENCOUNTER — Other Ambulatory Visit (HOSPITAL_COMMUNITY): Payer: Self-pay

## 2021-02-23 ENCOUNTER — Telehealth: Payer: Self-pay | Admitting: Hematology and Oncology

## 2021-02-23 ENCOUNTER — Encounter: Payer: Self-pay | Admitting: Hematology and Oncology

## 2021-02-23 NOTE — Telephone Encounter (Signed)
Scheduled per 1/12 rn msg, pt has been called and confirmed, will also mail calender per pt request

## 2021-02-28 ENCOUNTER — Other Ambulatory Visit: Payer: Self-pay | Admitting: Hematology and Oncology

## 2021-02-28 DIAGNOSIS — C7951 Secondary malignant neoplasm of bone: Secondary | ICD-10-CM

## 2021-03-02 ENCOUNTER — Other Ambulatory Visit (HOSPITAL_COMMUNITY): Payer: Self-pay

## 2021-03-08 ENCOUNTER — Other Ambulatory Visit (HOSPITAL_COMMUNITY): Payer: Self-pay

## 2021-03-15 ENCOUNTER — Other Ambulatory Visit: Payer: Self-pay | Admitting: Internal Medicine

## 2021-03-15 DIAGNOSIS — C61 Malignant neoplasm of prostate: Secondary | ICD-10-CM

## 2021-03-20 ENCOUNTER — Other Ambulatory Visit (HOSPITAL_COMMUNITY): Payer: Self-pay

## 2021-03-20 ENCOUNTER — Other Ambulatory Visit: Payer: Self-pay | Admitting: Hematology and Oncology

## 2021-03-20 ENCOUNTER — Encounter: Payer: Self-pay | Admitting: Hematology and Oncology

## 2021-03-20 MED ORDER — OXYCODONE HCL ER 40 MG PO T12A
40.0000 mg | EXTENDED_RELEASE_TABLET | Freq: Two times a day (BID) | ORAL | 0 refills | Status: DC
Start: 2021-03-20 — End: 2021-04-15
  Filled 2021-03-20: qty 60, 30d supply, fill #0

## 2021-03-21 ENCOUNTER — Other Ambulatory Visit: Payer: Self-pay | Admitting: Hematology and Oncology

## 2021-03-21 ENCOUNTER — Other Ambulatory Visit (HOSPITAL_COMMUNITY): Payer: Self-pay

## 2021-03-21 MED ORDER — ABIRATERONE ACETATE 250 MG PO TABS
ORAL_TABLET | ORAL | 2 refills | Status: DC
  Filled 2021-03-21 – 2021-04-09 (×2): qty 120, 30d supply, fill #0
  Filled 2021-05-01: qty 120, 30d supply, fill #1
  Filled 2021-05-30: qty 120, 30d supply, fill #2

## 2021-03-22 ENCOUNTER — Telehealth: Payer: Self-pay | Admitting: *Deleted

## 2021-03-22 ENCOUNTER — Other Ambulatory Visit (HOSPITAL_COMMUNITY): Payer: Self-pay

## 2021-03-22 NOTE — Telephone Encounter (Signed)
Received call from pt's daughter, [name]. She states that pt's refill of Oxycontin needs a PA. Message sent to Hoy Register, RN for this

## 2021-03-23 ENCOUNTER — Other Ambulatory Visit (HOSPITAL_COMMUNITY): Payer: Self-pay

## 2021-03-23 ENCOUNTER — Telehealth: Payer: Self-pay | Admitting: *Deleted

## 2021-03-23 NOTE — Telephone Encounter (Signed)
Received call from [place] at [facility]. States Trelegy should be 1 puff daily. States 1 puff releases 2 strips. He is requesting new Rx with once daily dosing.

## 2021-03-24 ENCOUNTER — Other Ambulatory Visit (HOSPITAL_COMMUNITY): Payer: Self-pay

## 2021-03-26 ENCOUNTER — Other Ambulatory Visit: Payer: Self-pay

## 2021-03-26 ENCOUNTER — Other Ambulatory Visit (HOSPITAL_COMMUNITY): Payer: Self-pay

## 2021-03-26 MED ORDER — GABAPENTIN 300 MG PO CAPS: 600.0000 mg | ORAL_CAPSULE | Freq: Two times a day (BID) | ORAL | 2 refills | Status: DC

## 2021-03-27 ENCOUNTER — Other Ambulatory Visit: Payer: Self-pay | Admitting: Internal Medicine

## 2021-03-27 DIAGNOSIS — I10 Essential (primary) hypertension: Secondary | ICD-10-CM

## 2021-03-27 MED ORDER — TRELEGY ELLIPTA 200-62.5-25 MCG/ACT IN AEPB: INHALATION_SPRAY | RESPIRATORY_TRACT | 3 refills | Status: DC

## 2021-03-27 NOTE — Telephone Encounter (Signed)
Received refill request for amlodipine 10mg  daily (chart reflects 5mg  daily) Call to patient to confirm dose taking, but he is unsure and asked that CMA contact son/dtr.  Spoke with Mrs Matier, she thinks pt is taking 5mg .   Pharmacy just filled 10mg  tabs today, but will place on hold until they receive confirmation from pcp.  Please advise which dose pt should be taking. Dose was changed during 01/17/21 visit with Dr Lorenso Courier

## 2021-03-28 ENCOUNTER — Encounter: Payer: Self-pay | Admitting: Hematology and Oncology

## 2021-03-28 ENCOUNTER — Other Ambulatory Visit: Payer: Self-pay | Admitting: Internal Medicine

## 2021-03-28 MED ORDER — AMLODIPINE BESYLATE 5 MG PO TABS
5.0000 mg | ORAL_TABLET | Freq: Every day | ORAL | 1 refills | Status: DC
Start: 2021-03-28 — End: 2021-10-04

## 2021-03-30 ENCOUNTER — Telehealth: Payer: Self-pay | Admitting: Hematology and Oncology

## 2021-03-30 NOTE — Telephone Encounter (Signed)
.  Called patient daughter to schedule appointment per 2/17 inbasket, patient is aware of date and time.

## 2021-04-02 ENCOUNTER — Other Ambulatory Visit: Payer: Self-pay | Admitting: Internal Medicine

## 2021-04-02 ENCOUNTER — Encounter: Payer: Self-pay | Admitting: Hematology and Oncology

## 2021-04-03 ENCOUNTER — Other Ambulatory Visit (HOSPITAL_COMMUNITY): Payer: Self-pay

## 2021-04-04 ENCOUNTER — Encounter (HOSPITAL_COMMUNITY): Payer: Self-pay

## 2021-04-04 ENCOUNTER — Ambulatory Visit (HOSPITAL_COMMUNITY)
Admission: RE | Admit: 2021-04-04 | Discharge: 2021-04-04 | Disposition: A | Discharge status: Home / Self Care | Payer: Medicare Other | Source: Ambulatory Visit | Attending: Hematology and Oncology | Admitting: Hematology and Oncology

## 2021-04-04 ENCOUNTER — Inpatient Hospital Stay: Payer: Medicare Other | Attending: Hematology and Oncology

## 2021-04-04 ENCOUNTER — Encounter (HOSPITAL_COMMUNITY)
Admission: RE | Admit: 2021-04-04 | Discharge: 2021-04-04 | Disposition: A | Discharge status: Home / Self Care | Payer: Medicare Other | Source: Ambulatory Visit | Attending: Hematology and Oncology | Admitting: Hematology and Oncology

## 2021-04-04 ENCOUNTER — Other Ambulatory Visit: Payer: Self-pay

## 2021-04-04 DIAGNOSIS — C7951 Secondary malignant neoplasm of bone: Secondary | ICD-10-CM | POA: Diagnosis present

## 2021-04-04 DIAGNOSIS — C61 Malignant neoplasm of prostate: Secondary | ICD-10-CM | POA: Insufficient documentation

## 2021-04-04 LAB — CBC WITH DIFFERENTIAL (CANCER CENTER ONLY)
Abs Immature Granulocytes: 0.02 10*3/uL (ref 0.00–0.07)
Basophils Absolute: 0 10*3/uL (ref 0.0–0.1)
Basophils Relative: 1 %
Eosinophils Absolute: 0.1 10*3/uL (ref 0.0–0.5)
Eosinophils Relative: 1 %
HCT: 28.1 % — ABNORMAL LOW (ref 39.0–52.0)
Hemoglobin: 8.5 g/dL — ABNORMAL LOW (ref 13.0–17.0)
Immature Granulocytes: 0 %
Lymphocytes Relative: 10 %
Lymphs Abs: 0.7 10*3/uL (ref 0.7–4.0)
MCH: 20.8 pg — ABNORMAL LOW (ref 26.0–34.0)
MCHC: 30.2 g/dL (ref 30.0–36.0)
MCV: 68.7 fL — ABNORMAL LOW (ref 80.0–100.0)
Monocytes Absolute: 0.6 10*3/uL (ref 0.1–1.0)
Monocytes Relative: 9 %
Neutro Abs: 5.4 10*3/uL (ref 1.7–7.7)
Neutrophils Relative %: 79 %
Platelet Count: 390 10*3/uL (ref 150–400)
RBC: 4.09 MIL/uL — ABNORMAL LOW (ref 4.22–5.81)
RDW: 19.1 % — ABNORMAL HIGH (ref 11.5–15.5)
WBC Count: 6.8 10*3/uL (ref 4.0–10.5)
nRBC: 0 % (ref 0.0–0.2)

## 2021-04-04 LAB — CMP (CANCER CENTER ONLY)
ALT: 6 U/L (ref 0–44)
AST: 9 U/L — ABNORMAL LOW (ref 15–41)
Albumin: 4 g/dL (ref 3.5–5.0)
Alkaline Phosphatase: 86 U/L (ref 38–126)
Anion gap: 6 (ref 5–15)
BUN: 15 mg/dL (ref 8–23)
CO2: 32 mmol/L (ref 22–32)
Calcium: 8.9 mg/dL (ref 8.9–10.3)
Chloride: 98 mmol/L (ref 98–111)
Creatinine: 0.94 mg/dL (ref 0.61–1.24)
GFR, Estimated: >60 mL/min (ref >60)
Glucose, Bld: 111 mg/dL — ABNORMAL HIGH (ref 70–99)
Potassium: 4.1 mmol/L (ref 3.5–5.1)
Sodium: 136 mmol/L (ref 135–145)
Total Bilirubin: 0.5 mg/dL (ref 0.3–1.2)
Total Protein: 7.6 g/dL (ref 6.5–8.1)

## 2021-04-04 MED ORDER — SODIUM CHLORIDE (PF) 0.9 % IJ SOLN
INTRAMUSCULAR | Status: AC
  Filled 2021-04-04: qty 50

## 2021-04-04 MED ORDER — IOHEXOL 300 MG/ML  SOLN
100.0000 mL | Freq: Once | INTRAMUSCULAR | Status: AC | PRN
  Administered 2021-04-04: 100 mL via INTRAVENOUS

## 2021-04-04 MED ORDER — TECHNETIUM TC 99M MEDRONATE IV KIT
19.1000 | PACK | Freq: Once | INTRAVENOUS | Status: AC
  Administered 2021-04-04: 19.1 via INTRAVENOUS

## 2021-04-05 ENCOUNTER — Encounter: Payer: Self-pay | Admitting: Podiatry

## 2021-04-05 ENCOUNTER — Ambulatory Visit: Payer: Medicare (Managed Care) | Admitting: Hematology and Oncology

## 2021-04-05 ENCOUNTER — Ambulatory Visit: Payer: Medicare (Managed Care)

## 2021-04-05 ENCOUNTER — Ambulatory Visit (INDEPENDENT_AMBULATORY_CARE_PROVIDER_SITE_OTHER): Payer: Medicare Other | Admitting: Podiatry

## 2021-04-05 ENCOUNTER — Inpatient Hospital Stay: Payer: Medicare Other

## 2021-04-05 DIAGNOSIS — B351 Tinea unguium: Secondary | ICD-10-CM | POA: Diagnosis not present

## 2021-04-05 DIAGNOSIS — M79675 Pain in left toe(s): Secondary | ICD-10-CM | POA: Diagnosis not present

## 2021-04-05 DIAGNOSIS — M79674 Pain in right toe(s): Secondary | ICD-10-CM | POA: Diagnosis not present

## 2021-04-05 LAB — TESTOSTERONE: Testosterone: <3 ng/dL — ABNORMAL LOW (ref 264–916)

## 2021-04-05 LAB — PROSTATE-SPECIFIC AG, SERUM (LABCORP): Prostate Specific Ag, Serum: 1.1 ng/mL (ref 0.0–4.0)

## 2021-04-05 NOTE — Progress Notes (Signed)
°  Subjective:  Patient ID: Dustin Alvarez, male    DOB: 1955/03/14,  MRN: [id number]  Chief Complaint  Patient presents with   Nail Problem      Onychomycosis of toenail, RFC, foot exam     66 y.o. male presents with the above complaint. History confirmed with patient.  Toenails are thick elongated he cannot cut them  Objective:  Physical Exam: warm, good capillary refill, no trophic changes or ulcerative lesions, normal DP and PT pulses, normal sensory exam, and bilateral hallux thickened elongated brown discolored toenails with hypertrophy and subungual debris  Assessment:   1. Pain due to onychomycosis of toenails of both feet      Plan:  Patient was evaluated and treated and all questions answered.  Discussed the etiology and treatment options for the condition in detail with the patient. Educated patient on the topical and oral treatment options for mycotic nails. Recommended debridement of the nails today. Sharp and mechanical debridement performed of all painful and mycotic nails today. Nails debrided in length and thickness using a nail nipper to level of comfort. Discussed treatment options including appropriate shoe gear. Follow up as needed for painful nails.    Return if symptoms worsen or fail to improve.

## 2021-04-06 ENCOUNTER — Other Ambulatory Visit: Payer: Self-pay | Admitting: Hematology and Oncology

## 2021-04-06 ENCOUNTER — Other Ambulatory Visit (HOSPITAL_COMMUNITY): Payer: Self-pay

## 2021-04-06 ENCOUNTER — Telehealth: Payer: Self-pay | Admitting: *Deleted

## 2021-04-06 MED ORDER — OXYCODONE HCL 5 MG PO TABS
ORAL_TABLET | ORAL | 0 refills | Status: DC | PRN
Start: 2021-04-06 — End: 2021-05-02
  Filled 2021-04-06: qty 180, 30d supply, fill #0

## 2021-04-06 NOTE — Telephone Encounter (Signed)
TCT pt's adult child, [name] regarding recent scan results.  Spoke with her and informed her that her dad's scan was stable, no evidence of progression or spread of disease.  [name] voiced understanding.    She does say that her father needs refill of his oxycodone 5 mg tabs. Last filled on 02/19/21 for # [phone]  at [facility]

## 2021-04-06 NOTE — Telephone Encounter (Signed)
-----   Message from [name], MD sent at 04/06/2021 12:55 PM EST ----- Please let Dustin Alvarez know that his CT scan is stable, no evidence of progression or spread of disease.   ----- Message ----- From: Interface, Rad Results In Sent: 04/06/2021  12:10 PM EST To: [name], MD

## 2021-04-09 ENCOUNTER — Other Ambulatory Visit (HOSPITAL_COMMUNITY): Payer: Self-pay

## 2021-04-10 ENCOUNTER — Ambulatory Visit: Payer: Medicare Other | Admitting: Vascular Surgery

## 2021-04-11 ENCOUNTER — Other Ambulatory Visit (HOSPITAL_COMMUNITY): Payer: Self-pay

## 2021-04-12 ENCOUNTER — Other Ambulatory Visit (HOSPITAL_COMMUNITY): Payer: Self-pay

## 2021-04-13 ENCOUNTER — Inpatient Hospital Stay: Payer: Medicare Other

## 2021-04-13 ENCOUNTER — Other Ambulatory Visit: Payer: Self-pay | Admitting: *Deleted

## 2021-04-13 ENCOUNTER — Inpatient Hospital Stay: Payer: Medicare Other | Attending: Hematology and Oncology | Admitting: Hematology and Oncology

## 2021-04-13 ENCOUNTER — Other Ambulatory Visit: Payer: Self-pay

## 2021-04-13 VITALS — BP 114/66 | HR 91 | Temp 98.3°F | Resp 17 | Wt 142.1 lb

## 2021-04-13 DIAGNOSIS — C7951 Secondary malignant neoplasm of bone: Secondary | ICD-10-CM | POA: Diagnosis not present

## 2021-04-13 DIAGNOSIS — C61 Malignant neoplasm of prostate: Secondary | ICD-10-CM

## 2021-04-13 DIAGNOSIS — N522 Drug-induced erectile dysfunction: Secondary | ICD-10-CM | POA: Diagnosis not present

## 2021-04-13 DIAGNOSIS — M79604 Pain in right leg: Secondary | ICD-10-CM | POA: Diagnosis not present

## 2021-04-13 DIAGNOSIS — M79605 Pain in left leg: Secondary | ICD-10-CM

## 2021-04-13 LAB — IRON AND IRON BINDING CAPACITY (CC-WL,HP ONLY)
Iron: 30 ug/dL — ABNORMAL LOW (ref 45–182)
Saturation Ratios: 6 % — ABNORMAL LOW (ref 17.9–39.5)
TIBC: 526 ug/dL — ABNORMAL HIGH (ref 250–450)
UIBC: 496 ug/dL — ABNORMAL HIGH (ref 117–376)

## 2021-04-13 LAB — FERRITIN: Ferritin: 5 ng/mL — ABNORMAL LOW (ref 24–336)

## 2021-04-13 LAB — CBC WITH DIFFERENTIAL (CANCER CENTER ONLY)
Abs Immature Granulocytes: 0.01 10*3/uL (ref 0.00–0.07)
Basophils Absolute: 0 10*3/uL (ref 0.0–0.1)
Basophils Relative: 0 %
Eosinophils Absolute: 0 10*3/uL (ref 0.0–0.5)
Eosinophils Relative: 0 %
HCT: 30.1 % — ABNORMAL LOW (ref 39.0–52.0)
Hemoglobin: 8.8 g/dL — ABNORMAL LOW (ref 13.0–17.0)
Immature Granulocytes: 0 %
Lymphocytes Relative: 9 %
Lymphs Abs: 0.5 10*3/uL — ABNORMAL LOW (ref 0.7–4.0)
MCH: 20 pg — ABNORMAL LOW (ref 26.0–34.0)
MCHC: 29.2 g/dL — ABNORMAL LOW (ref 30.0–36.0)
MCV: 68.4 fL — ABNORMAL LOW (ref 80.0–100.0)
Monocytes Absolute: 0.3 10*3/uL (ref 0.1–1.0)
Monocytes Relative: 6 %
Neutro Abs: 4.3 10*3/uL (ref 1.7–7.7)
Neutrophils Relative %: 85 %
Platelet Count: 408 10*3/uL — ABNORMAL HIGH (ref 150–400)
RBC: 4.4 MIL/uL (ref 4.22–5.81)
RDW: 18.9 % — ABNORMAL HIGH (ref 11.5–15.5)
WBC Count: 5.2 10*3/uL (ref 4.0–10.5)
nRBC: 0 % (ref 0.0–0.2)

## 2021-04-13 LAB — SAMPLE TO BLOOD BANK

## 2021-04-13 LAB — RETIC PANEL
Immature Retic Fract: 36.9 % — ABNORMAL HIGH (ref 2.3–15.9)
RBC.: 4.35 MIL/uL (ref 4.22–5.81)
Retic Count, Absolute: 63.9 10*3/uL (ref 19.0–186.0)
Retic Ct Pct: 1.5 % (ref 0.4–3.1)
Reticulocyte Hemoglobin: 18.4 pg — ABNORMAL LOW (ref >27.9)

## 2021-04-13 MED ORDER — LEUPROLIDE ACETATE (3 MONTH) 22.5 MG ~~LOC~~ KIT
22.5000 mg | PACK | Freq: Once | SUBCUTANEOUS | Status: AC
  Administered 2021-04-13: 22.5 mg via SUBCUTANEOUS
  Filled 2021-04-13: qty 22.5

## 2021-04-15 ENCOUNTER — Encounter: Payer: Self-pay | Admitting: Hematology and Oncology

## 2021-04-15 MED ORDER — OXYCODONE HCL ER 40 MG PO T12A
40.0000 mg | EXTENDED_RELEASE_TABLET | Freq: Two times a day (BID) | ORAL | 0 refills | Status: DC
  Filled 2021-04-15 – 2021-04-25 (×2): qty 60, 30d supply, fill #0

## 2021-04-15 NOTE — Progress Notes (Signed)
[place] [phone]   [phone]  PROGRESS NOTE  Patient Care Team: [name], MD as PCP - General (Internal Medicine) Cira Rue, RN Nurse Navigator as Registered Nurse (Medical Oncology)  Hematological/Oncological History # Metastatic Castrate Sensitive Prostate Cancer, Metastatic to Bone 1) 07/13/2019: Abdomen/Pelvis CT extensive lytic changes with superimposed pathological fractures. Lytic change noted in the right iliac bone and new lucencies in the right T10 vertebrae.  2) 07/19/2019: biopsy of sacral mass shows metastatic prostatic adenocarcinoma, Gleason 4+4.  3) 07/2019: reportedly received Zometa 4g IV and eligard 22.5. Started on Casodex.  4) 6/10-6/16/2021: received palliative radiation to the sacrum at [place]. Received 2000cGy over 6 days 5) Moved to [place]. Lost to follow up from [facility] in [place], [place]. 6) 11/05/2019: establish care with Dr. Lorenso Courier  7) 11/16/2019: Administered Zometa 62m IV and Lupron 22.5 mg 8) 12/08/2019: started abiraterone 10057mPO daily  9) 03/09/2020: Administered Zometa 60m79mV and Lupron 22.5 mg 10) 06/01/2020: Administered Zometa 60mg20m and Lupron 22.5 mg 11) No show for interval zometa/lupron 12) 01/17/2021: Administered Zometa 60mg 69mand Lupron 22.5 mg 13) 04/13/2021: Administered Lupron 22.5 mg  Interval History:  Dustin Alvarez male with medical history significant for metastatic prostate cancer who presents for a follow up visit. The patient's last visit was on 01/17/2021. In the interim since the last visit he has had no major changes in his health.   On exam today Dustin Alvarez he has been well in the interim since our last visit.  He is disappointed that his "Viagra is not working".  He notes that he has been taking 100 mg p.o. with no effect.  He notes that he is also more interested in getting the surgery done for his legs in order to help improve circulation.  He  notes that his pain is doing a little better though he does feel like his "toes are freezing".  Overall he has been at his baseline level of health and is willing and able to proceed with continued Lupron shots.  He otherwise denies any fevers, chills, sweats, nausea, vomiting or diarrhea.  Full 10 point ROS is listed below.    MEDICAL HISTORY:  Past Medical History:  Diagnosis Date   Asthma    COPD (chronic obstructive pulmonary disease) (HCC)    GERD (gastroesophageal reflux disease)    Hypertension    Prostate cancer (HCC) [place] SURGICAL HISTORY: Past Surgical History:  Procedure Laterality Date   ABDOMINAL AORTOGRAM W/LOWER EXTREMITY N/A 10/26/2020   Procedure: ABDOMINAL AORTOGRAM W/LOWER EXTREMITY;  Surgeon: ClarkMarty Heck  [facility];  Service: Cardiovascular;  Laterality: N/A;   WRIST SURGERY Left 2017    SOCIAL HISTORY: Social History   Socioeconomic History   Marital status: Widowed    Spouse name: Not on file   Number of children: Not on file   Years of education: Not on file   Highest education level: Not on file  Occupational History   Not on file  Tobacco Use   Smoking status: Every Day    Packs/day: 1.00    Types: Cigarettes   Smokeless tobacco: Never   Tobacco comments:    almost 1 pkd. Wants patches   Vaping Use   Vaping Use: Never used  Substance and Sexual Activity   Alcohol use: Yes    Comment: 1-2 drinks per week.   Drug use: Not Currently   Sexual  activity: Not Currently  Other Topics Concern   Not on file  Social History Narrative   Not on file   Social Determinants of Health   Financial Resource Strain: Not on file  Food Insecurity: Not on file  Transportation Needs: Not on file  Physical Activity: Not on file  Stress: Not on file  Social Connections: Not on file  Intimate Partner Violence: Not on file    FAMILY HISTORY: Family History  Problem Relation Age of Onset   COPD Father    Diabetes Sister     Diabetes Brother    Breast cancer Neg Hx    Prostate cancer Neg Hx    Colon cancer Neg Hx    Pancreatic cancer Neg Hx     ALLERGIES:  is allergic to ace inhibitors and lisinopril.  MEDICATIONS:  Current Outpatient Medications  Medication Sig Dispense Refill   albuterol (VENTOLIN HFA) 108 (90 Base) MCG/ACT inhaler Inhale 2 puffs into the lungs every 6 (six) hours as needed for wheezing or shortness of breath. 8.5 g 3   tamsulosin (FLOMAX) 0.4 MG CAPS capsule Take 1 capsule (0.4 mg total) by mouth in the morning and at bedtime. 180 capsule 0   abiraterone acetate (ZYTIGA) 250 MG tablet TAKE 4 TABLETS BY MOUTH DAILY. TAKE ON AN EMPTY STOMACH 1 HOUR BEFORE OR 2 HOURS AFTER A MEAL 120 tablet 2   amLODipine (NORVASC) 5 MG tablet Take 1 tablet (5 mg total) by mouth daily. 90 tablet 1   calcium-vitamin D (OSCAL WITH D) 500-200 MG-UNIT tablet Take 1 tablet by mouth daily with breakfast. 90 tablet 3   finasteride (PROSCAR) 5 MG tablet Take 1 tablet (5 mg total) by mouth daily. 90 tablet 1   Fluticasone-Umeclidin-Vilant (TRELEGY ELLIPTA) 200-62.5-25 MCG/ACT AEPB Use once daily 1 each 3   gabapentin (NEURONTIN) 300 MG capsule Take 2 capsules (600 mg total) by mouth 2 (two) times daily. 120 capsule 2   hydrochlorothiazide (MICROZIDE) 12.5 MG capsule Take 12.5 mg by mouth daily.     nicotine (NICOTROL) 10 MG inhaler Inhale 1 Cartridge (1 continuous puffing total) into the lungs as needed for smoking cessation. 42 each 0   oxyCODONE (OXY IR/ROXICODONE) 5 MG immediate release tablet TAKE 1 TABLET BY MOUTH EVERY 4 HOURS AS NEEDED 180 tablet 0   oxyCODONE (OXYCONTIN) 40 mg 12 hr tablet Take 1 tablet by mouth every 12  hours. 60 tablet 0   pantoprazole (PROTONIX) 40 MG tablet Take 1 tablet (40 mg total) by mouth daily. 30 tablet 3   predniSONE (DELTASONE) 5 MG tablet Take 1 tablet (5 mg total) by mouth daily with breakfast. (do not take prior to starting Abiraterone) 90 tablet 1   sildenafil (VIAGRA) 100 MG  tablet Take 1 tablet (100 mg total) by mouth daily as needed for erectile dysfunction. 30 tablet 0   traZODone (DESYREL) 100 MG tablet Take 0.5 tablets (50 mg total) by mouth at bedtime. 90 tablet 1   No current facility-administered medications for this visit.    REVIEW OF SYSTEMS:   Constitutional: ( - ) fevers, ( - )  chills , ( - ) night sweats Eyes: ( - ) blurriness of vision, ( - ) double vision, ( - ) watery eyes Ears, nose, mouth, throat, and face: ( - ) mucositis, ( - ) sore throat Respiratory: ( - ) cough, ( - ) dyspnea, ( - ) wheezes Cardiovascular: ( - ) palpitation, ( - ) chest discomfort, ( - )  lower extremity swelling Gastrointestinal:  ( - ) nausea, ( - ) heartburn, ( - ) change in bowel habits Skin: ( - ) abnormal skin rashes Lymphatics: ( - ) new lymphadenopathy, ( - ) easy bruising Neurological: ( - ) numbness, ( - ) tingling, ( - ) new weaknesses Behavioral/Psych: ( - ) mood change, ( - ) new changes  All other systems were reviewed with the patient and are negative.  PHYSICAL EXAMINATION:  Vitals:   04/13/21 1400  BP: 114/66  Pulse: 91  Resp: 17  Temp: 98.3 F (36.8 C)  SpO2: 91%    Filed Weights   04/13/21 1400  Weight: 142 lb 1.6 oz (64.5 kg)     GENERAL: well appearing elderly African American male in NAD. SKIN: skin color, texture, turgor are normal, no rashes or significant lesions EYES: conjunctiva are pink and non-injected, sclera clear LUNGS: clear to auscultation and percussion with normal breathing effort HEART: regular rate & rhythm and no murmurs and no lower extremity edema Musculoskeletal: no cyanosis of digits and no clubbing  PSYCH: alert & oriented x 3, fluent speech NEURO: no focal motor/sensory deficits  LABORATORY DATA:  I have reviewed the data as listed CBC Latest Ref Rng & Units 04/13/2021 04/04/2021 01/17/2021  WBC 4.0 - 10.5 K/uL 5.2 6.8 7.4  Hemoglobin 13.0 - 17.0 g/dL 8.8(L) 8.5(L) 9.6(L)  Hematocrit 39.0 - 52.0 % 30.1(L)  28.1(L) 31.1(L)  Platelets 150 - 400 K/uL 408(H) 390 393    CMP Latest Ref Rng & Units 04/04/2021 01/17/2021 11/16/2020  Glucose 70 - 99 mg/dL 111(H) 102(H) 133(H)  BUN 8 - 23 mg/dL 15 14 15   Creatinine 0.61 - 1.24 mg/dL 0.94 0.83 0.83  Sodium 135 - 145 mmol/L 136 141 142  Potassium 3.5 - 5.1 mmol/L 4.1 3.6 4.2  Chloride 98 - 111 mmol/L 98 103 106  CO2 22 - 32 mmol/L 32 28 28  Calcium 8.9 - 10.3 mg/dL 8.9 8.9 9.5  Total Protein 6.5 - 8.1 g/dL 7.6 7.5 7.6  Total Bilirubin 0.3 - 1.2 mg/dL 0.5 0.3 0.3  Alkaline Phos 38 - 126 U/L 86 87 92  AST 15 - 41 U/L 9(L) 9(L) 9(L)  ALT 0 - 44 U/L 6 6 7     RADIOGRAPHIC STUDIES: NM Bone Scan Whole Body  Result Date: 04/06/2021 CLINICAL DATA:  Prostate carcinoma EXAM: NUCLEAR MEDICINE WHOLE BODY BONE SCAN TECHNIQUE: Whole body anterior and posterior images were obtained approximately 3 hours after intravenous injection of radiopharmaceutical. RADIOPHARMACEUTICALS:  19.1 mCi Technetium-29mMDP IV COMPARISON:  None. FINDINGS: There is small focal area of tracer uptake in the lateral aspect of left seventh rib. This finding is seen only in the anterior images. In the CT done on 04/04/2021, mild deformity was seen in the lateral aspect of left seventh rib possibly suggesting healing fracture. There is patchy increased tracer uptake in the sacrum, more so on the right side. Subtle increased uptake in the area of metatarsal base in the right foot may be residual from previous injury. No other focal abnormality is seen in the axial and visualized portions of the appendicular skeleton. There is normal activity in the kidneys and urinary bladder. IMPRESSION: There are patchy areas of increased uptake in the sacrum suggesting metastatic disease. There is small focus of tracer uptake in the lateral aspect of left seventh rib which may be due to healing fracture. Less likely possibility would be metastatic disease. Electronically Signed   By: PPrudy FeelerD.  On:  04/06/2021 14:36   CT CHEST ABDOMEN PELVIS W CONTRAST  Result Date: 04/06/2021 CLINICAL DATA:  Prostate cancer.  Restaging.  * onc * EXAM: CT CHEST, ABDOMEN, AND PELVIS WITH CONTRAST TECHNIQUE: Multidetector CT imaging of the chest, abdomen and pelvis was performed following the standard protocol during bolus administration of intravenous contrast. RADIATION DOSE REDUCTION: This exam was performed according to the departmental dose-optimization program which includes automated exposure control, adjustment of the mA and/or kV according to patient size and/or use of iterative reconstruction technique. CONTRAST:  157m OMNIPAQUE IOHEXOL 300 MG/ML  SOLN COMPARISON:  10/06/2020 FINDINGS: CT CHEST FINDINGS Cardiovascular: The heart size is normal. No substantial pericardial effusion. Coronary artery calcification is evident. Mild atherosclerotic calcification is noted in the wall of the thoracic aorta. Mediastinum/Nodes: No mediastinal lymphadenopathy. No evidence for gross hilar lymphadenopathy although assessment is limited by the lack of intravenous contrast on the current study. The esophagus has normal imaging features. There is no axillary lymphadenopathy. Lungs/Pleura: Centrilobular and paraseptal emphysema evident. No new suspicious pulmonary nodule or mass. No focal airspace consolidation. No pleural effusion. Musculoskeletal: Scattered sclerotic bone lesions are similar to prior. Index lesion in the T10 vertebral body measured previously 1.6 x 1.2 cm is stable in the interval. CT ABDOMEN PELVIS FINDINGS Hepatobiliary: No suspicious focal abnormality within the liver parenchyma. There is no evidence for gallstones, gallbladder wall thickening, or pericholecystic fluid. No intrahepatic or extrahepatic biliary dilation. Pancreas: No focal mass lesion. No dilatation of the main duct. No intraparenchymal cyst. No peripancreatic edema. Spleen: No splenomegaly. No focal mass lesion. Adrenals/Urinary Tract: No  adrenal nodule or mass. Small cyst lower pole right kidney is stable in the interval. Left kidney unremarkable. No evidence for hydroureter. The urinary bladder appears normal for the degree of distention. Stomach/Bowel: Stomach is unremarkable. No gastric wall thickening. No evidence of outlet obstruction. Duodenum is normally positioned as is the ligament of Treitz. No small bowel wall thickening. No small bowel dilatation. The terminal ileum is normal. No gross colonic mass. No colonic wall thickening. Vascular/Lymphatic: There is mild atherosclerotic calcification of the abdominal aorta without aneurysm. There is no gastrohepatic or hepatoduodenal ligament lymphadenopathy. No retroperitoneal or mesenteric lymphadenopathy. Small lymph nodes in the gastrohepatic ligament are similar to prior. No pelvic sidewall lymphadenopathy. Left femoral vein does not opacify, as before. Reproductive: The prostate gland and seminal vesicles are unremarkable. Other: No intraperitoneal free fluid. Musculoskeletal: Heterogeneous mineralization with fracture and collapse of the S1 vertebral body is stable in the interval. Diffuse heterogeneous sclerosis in the upper sacrum again noted. No new suspicious lytic or sclerotic osseous abnormality. IMPRESSION: 1. Stable exam. No new or progressive findings. 2. Stable appearance of scattered sclerotic bone lesions with fracture and collapse of the S1 vertebral body. Findings presumably secondary to bony metastatic involvement. 3. Aortic Atherosclerosis (ICD10-I70.0) and Emphysema (ICD10-J43.9). Electronically Signed   By: EMisty StanleyM.D.   On: 04/06/2021 12:07    ASSESSMENT & PLAN Dustin [id number]. male with medical history significant for metastatic prostate cancer who presents for a follow up visit.  Mr. GGeterhas excessively transitioned off the Casodex and has received his first dosages of Zometa and Lupron.  His PSA continues to trend downward and is testosterone is  at castrate level.  Given this we started abiraterone therapy 1000 mg p.o. daily with 5 mg of prednisone.   The major symptom the patient has been experiencing has been pain.  His spinal sacral lesion has been poorly  controlled on oxycodone therapy.  He did previously receive radiation therapy in June 2021, but due to continued excruciating pain we asked for him to be reexamined by radiation oncology to see if there is any further intervention they can offer at this time. He has had further palliative radiation, though this has not relieved his pain. Continue OxyContin to 30 mg twice daily (per his request, issues with constipation at higher doses) and additionally continue his as needed fast acting oxycodone. We will continue gabapentin at 370m BID.  We will plan to have him back in 12 weeks for clinic visit.  PSA 07/13/2019: PSA 819 08/27/2019: 226 11/05/2019: 44.8 12/08/2019: 27.3 12/27/2019: 21.2  02/24/2020: 12.1 03/22/2020: PSA 8.8 05/18/2020: PSA 4.1 07/31/2020: PSA 2.8 11/16/2020: PSA 1.6  # Metastatic Castrate Sensitive Prostate Cancer, Metastatic to Bone --findings are most consistent with metastatic adenocarcinoma of the prostate.  --testosterone at last check was <3, PSA down to 1.1 ( from 819 at diagnosis)  --Administered  Lupron 22.5 mg today and zometa at next opportunity. Continue q 3 months.  --continue abiraterone 100536mwith prednisone 36m31mO daily. Assure he is compliant with the full dose and steroids.  --patient due for a repeat CT scan and NM bone scan in August 2023. Repeat q 6 months or based on PSA response.  --RTC in 12 weeks for continued monitoring on Abiraterone therapy +zometa/lupron.   # Erectile Dysfunction --patient trialed on cialis and Viagra with sub-optimal results --patient requesting "something stronger" --referred to urology for assistance in managing his ED.    #Pain Control --gabapentin 600 mg BID to help with pain.  --continue oxycodone 5-82m22mH PRN  for pain control. --continue oxycontin 30mg39mH for basal pain control --established care with Radiation Oncology to help with painful bone lesions. Radiation therapy completed 02/01/2021.  --seen by neurosurgery, provided injections with no relief.  -- Met with vascular surgery regarding the concern for poor circulation of the lower extremities.  He declined operative management.  They plan to see him back to monitor ABIs  No orders of the defined types were placed in this encounter.   All questions were answered. The patient knows to call the clinic with any problems, questions or concerns.  A total of more than 30 minutes were spent on this encounter and over half of that time was spent on counseling and coordination of care as outlined above.   Rees Santistevan Ledell PeoplesDepartment of Hematology/Oncology [facility]: [phone]: [phone]: [email or web address]  04/15/2021 11:51 AM

## 2021-04-16 ENCOUNTER — Other Ambulatory Visit: Payer: Self-pay | Admitting: Hematology and Oncology

## 2021-04-16 ENCOUNTER — Other Ambulatory Visit: Payer: Self-pay | Admitting: *Deleted

## 2021-04-16 ENCOUNTER — Other Ambulatory Visit (HOSPITAL_COMMUNITY): Payer: Self-pay

## 2021-04-16 ENCOUNTER — Telehealth: Payer: Self-pay | Admitting: *Deleted

## 2021-04-16 ENCOUNTER — Telehealth: Payer: Self-pay | Admitting: Hematology and Oncology

## 2021-04-16 DIAGNOSIS — D500 Iron deficiency anemia secondary to blood loss (chronic): Secondary | ICD-10-CM

## 2021-04-16 DIAGNOSIS — C61 Malignant neoplasm of prostate: Secondary | ICD-10-CM

## 2021-04-16 DIAGNOSIS — C7951 Secondary malignant neoplasm of bone: Secondary | ICD-10-CM

## 2021-04-16 NOTE — Telephone Encounter (Signed)
TCT pt's daughter, [name]. Spoke with her. Advised that her dad had iron deficiency anemia and will need IV iron supplement. This is to be scheduled @ the [facility] infusion center.  Also advised that we may do a referral to GI if the low HGB persists. ?Advised that a urology referral has been made for pt's erectile dysfunction. [name] voiced understanding. ?

## 2021-04-16 NOTE — Telephone Encounter (Signed)
-----   Message from [name], MD sent at 04/16/2021  1:54 PM EST ----- ?These let Mr. Dustin Alvarez know that his labs are most consistent with iron deficiency anemia.  His hemoglobin levels continue to drop.  The etiology of his anemia is unclear, we will likely need a GI referral in order to consider EGD/colonoscopy.  In the interim we will be ordering IV iron therapy in order to help bolster his levels (request placed for [employer] ? ?----- Message ----- ?From: Interface, Lab In [place] ?Sent: 04/13/2021   1:53 PM EST ?To: [name], MD ? ? ?

## 2021-04-16 NOTE — Telephone Encounter (Signed)
Left message with follow-up appointment per 3/3 los. ?

## 2021-04-24 ENCOUNTER — Ambulatory Visit: Payer: Medicare Other | Admitting: Vascular Surgery

## 2021-04-24 ENCOUNTER — Other Ambulatory Visit: Payer: Self-pay | Admitting: Student

## 2021-04-24 DIAGNOSIS — K219 Gastro-esophageal reflux disease without esophagitis: Secondary | ICD-10-CM

## 2021-04-25 ENCOUNTER — Other Ambulatory Visit (HOSPITAL_COMMUNITY): Payer: Self-pay

## 2021-04-25 ENCOUNTER — Telehealth: Payer: Self-pay

## 2021-04-25 ENCOUNTER — Other Ambulatory Visit: Payer: Self-pay | Admitting: Internal Medicine

## 2021-04-25 ENCOUNTER — Encounter: Payer: Self-pay | Admitting: Hematology and Oncology

## 2021-04-25 ENCOUNTER — Other Ambulatory Visit: Payer: Self-pay | Admitting: Physician Assistant

## 2021-04-25 MED ORDER — XTAMPZA ER 36 MG PO C12A
36.0000 mg | EXTENDED_RELEASE_CAPSULE | Freq: Two times a day (BID) | ORAL | 0 refills | Status: DC | PRN
  Filled 2021-04-25: qty 60, 30d supply, fill #0

## 2021-04-25 NOTE — Telephone Encounter (Signed)
Son called to say that prior auth needed for refill of Oxycontin. [place] to confirm. Prior auth team sent authorization request in today. Waiting on auth. Son updated on status at this point. ?

## 2021-04-25 NOTE — Telephone Encounter (Signed)
Called son to advise that prescription of Xtampza had been sent to [facility] for long acting pain control. ?

## 2021-04-26 ENCOUNTER — Other Ambulatory Visit (HOSPITAL_COMMUNITY): Payer: Self-pay

## 2021-04-27 ENCOUNTER — Telehealth: Payer: Self-pay | Admitting: Hematology and Oncology

## 2021-04-27 ENCOUNTER — Telehealth: Payer: Self-pay | Admitting: *Deleted

## 2021-04-27 ENCOUNTER — Inpatient Hospital Stay: Payer: Medicare Other

## 2021-04-27 NOTE — Telephone Encounter (Signed)
TCT pt's family member-[name] as pt has not shown up for his appt. No answer. ?TCT patient about his appt today. Spoke with him. He said he has not heard from [place] about an appt for today. She is the one who drives him. Advised that I will have it re-scheduled for next week, labs prior to Zometa. Pt voiced understanding. ?

## 2021-04-27 NOTE — Telephone Encounter (Signed)
.  Called patient to schedule appointment per 3/17 inbasket, patient daughter  is aware of date and time.   ?

## 2021-04-30 ENCOUNTER — Telehealth: Payer: Self-pay | Admitting: *Deleted

## 2021-04-30 ENCOUNTER — Other Ambulatory Visit (HOSPITAL_COMMUNITY): Payer: Self-pay

## 2021-04-30 NOTE — Telephone Encounter (Signed)
Received call from pt's family member, Dustin Alvarez. She states that the new Dustin Alvarez is not controlling his pain as well as the Oxycontin did. Pt c/o increased swelling in both legs over the last few days. He is using a cane again when he walks. ?Also they are aware of his Zometa infusion appt tomorrow @ 9:15 am ? ?

## 2021-04-30 NOTE — Telephone Encounter (Signed)
TCT [employer].  She to her about her dad's pain management. Advised that we could have her dad see our Palliative Care team as they specialize in pain management. Advised that this appt would be here at the cancer center. [name] stated that they tried it at home but her dad wasn't too pleased with it. Advised that this is for pain management and that it would be good to be connected to another part of the team that specializes in pain management. She is agreement and will discuss with her dad. She will call me back this week  about their decision. ?

## 2021-05-01 ENCOUNTER — Inpatient Hospital Stay: Payer: Medicare Other

## 2021-05-01 ENCOUNTER — Other Ambulatory Visit (HOSPITAL_COMMUNITY): Payer: Self-pay

## 2021-05-02 ENCOUNTER — Inpatient Hospital Stay: Payer: Medicare Other

## 2021-05-02 ENCOUNTER — Other Ambulatory Visit (HOSPITAL_COMMUNITY): Payer: Self-pay

## 2021-05-02 ENCOUNTER — Other Ambulatory Visit: Payer: Self-pay

## 2021-05-02 ENCOUNTER — Telehealth: Payer: Self-pay | Admitting: *Deleted

## 2021-05-02 ENCOUNTER — Other Ambulatory Visit: Payer: Self-pay | Admitting: *Deleted

## 2021-05-02 ENCOUNTER — Other Ambulatory Visit: Payer: Self-pay | Admitting: Physician Assistant

## 2021-05-02 ENCOUNTER — Other Ambulatory Visit: Payer: Self-pay | Admitting: Hematology and Oncology

## 2021-05-02 DIAGNOSIS — C61 Malignant neoplasm of prostate: Secondary | ICD-10-CM | POA: Diagnosis not present

## 2021-05-02 DIAGNOSIS — D500 Iron deficiency anemia secondary to blood loss (chronic): Secondary | ICD-10-CM

## 2021-05-02 LAB — CBC WITH DIFFERENTIAL (CANCER CENTER ONLY)
Abs Immature Granulocytes: 0.02 10*3/uL (ref 0.00–0.07)
Basophils Absolute: 0 10*3/uL (ref 0.0–0.1)
Basophils Relative: 0 %
Eosinophils Absolute: 0.1 10*3/uL (ref 0.0–0.5)
Eosinophils Relative: 1 %
HCT: 26.9 % — ABNORMAL LOW (ref 39.0–52.0)
Hemoglobin: 8 g/dL — ABNORMAL LOW (ref 13.0–17.0)
Immature Granulocytes: 0 %
Lymphocytes Relative: 11 %
Lymphs Abs: 0.5 10*3/uL — ABNORMAL LOW (ref 0.7–4.0)
MCH: 19.7 pg — ABNORMAL LOW (ref 26.0–34.0)
MCHC: 29.7 g/dL — ABNORMAL LOW (ref 30.0–36.0)
MCV: 66.3 fL — ABNORMAL LOW (ref 80.0–100.0)
Monocytes Absolute: 0.4 10*3/uL (ref 0.1–1.0)
Monocytes Relative: 8 %
Neutro Abs: 3.9 10*3/uL (ref 1.7–7.7)
Neutrophils Relative %: 80 %
Platelet Count: 380 10*3/uL (ref 150–400)
RBC: 4.06 MIL/uL — ABNORMAL LOW (ref 4.22–5.81)
RDW: 18.8 % — ABNORMAL HIGH (ref 11.5–15.5)
WBC Count: 4.9 10*3/uL (ref 4.0–10.5)
nRBC: 0 % (ref 0.0–0.2)

## 2021-05-02 LAB — CMP (CANCER CENTER ONLY)
ALT: 8 U/L (ref 0–44)
AST: 8 U/L — ABNORMAL LOW (ref 15–41)
Albumin: 3.9 g/dL (ref 3.5–5.0)
Alkaline Phosphatase: 90 U/L (ref 38–126)
Anion gap: 7 (ref 5–15)
BUN: 10 mg/dL (ref 8–23)
CO2: 30 mmol/L (ref 22–32)
Calcium: 9 mg/dL (ref 8.9–10.3)
Chloride: 101 mmol/L (ref 98–111)
Creatinine: 0.8 mg/dL (ref 0.61–1.24)
GFR, Estimated: >60 mL/min (ref >60)
Glucose, Bld: 97 mg/dL (ref 70–99)
Potassium: 3.3 mmol/L — ABNORMAL LOW (ref 3.5–5.1)
Sodium: 138 mmol/L (ref 135–145)
Total Bilirubin: 0.6 mg/dL (ref 0.3–1.2)
Total Protein: 7.3 g/dL (ref 6.5–8.1)

## 2021-05-02 MED ORDER — OXYCODONE HCL 5 MG PO TABS
ORAL_TABLET | ORAL | 0 refills | Status: DC | PRN
  Filled 2021-05-04: qty 180, 30d supply, fill #0
  Filled ????-??-??: fill #0

## 2021-05-02 NOTE — Progress Notes (Signed)
Patient here for zometa infusion, he states he had his teeth pulled 1-2 months ago & was recently fitted for dentures.  Per Dr. Irene Limbo, dental clearance is needed before zometa can be given.  Patient informed, will reschedule. ?

## 2021-05-02 NOTE — Telephone Encounter (Signed)
TCT [employer], family member.  Advised that we had to cancel pt's Zometa infusion today as we have not gotten ok from pt's dentist. ?Call made to pt's dentist, Lovena Neighbours @ [phone] for ?dental clearance. Dr. Carlota Raspberry is to call back. ? ?Also advised [name] of pt's ongoing anemia-HGB of 8 today. Advised that we are making a referral to [facility]  so they can assist in determining cause of low HGB/Iron. ?[name] is in agreement with this.  [name] states that [name] is to have surgery on his femoral artery/vein soon and will be seeing Vascular surgeon this week. Advised that they will likely want his iron/HGB improved prior to surgery. Advised that we are scheduling him to get IV iron @ the [facility] center ASAP. ? ?Referral to  GI done. ?

## 2021-05-03 ENCOUNTER — Telehealth: Payer: Self-pay | Admitting: Pharmacy Technician

## 2021-05-03 ENCOUNTER — Other Ambulatory Visit: Payer: Self-pay | Admitting: *Deleted

## 2021-05-03 DIAGNOSIS — C61 Malignant neoplasm of prostate: Secondary | ICD-10-CM

## 2021-05-03 NOTE — Telephone Encounter (Signed)
Auth Submission: denied ?Payer: [employer] MEDICARE ?Medication & CPT/J Code(s) submitted: MONOFERRIC ? ?Route of submission (phone, fax, portal): PHONE ?Auth type: Buy/Bill ? ?Denied due to patient has not tried and or failed step therapy. ?Venofer ?Infed ?Ferrlecit ?Would you like to try venofer?? Please advise. ?Kim ? ?  ?

## 2021-05-03 NOTE — Progress Notes (Signed)
Palliative care referral done. 

## 2021-05-04 ENCOUNTER — Other Ambulatory Visit (HOSPITAL_COMMUNITY): Payer: Self-pay

## 2021-05-04 ENCOUNTER — Ambulatory Visit (INDEPENDENT_AMBULATORY_CARE_PROVIDER_SITE_OTHER): Payer: Medicare Other | Admitting: Nurse Practitioner

## 2021-05-04 ENCOUNTER — Encounter: Payer: Self-pay | Admitting: Nurse Practitioner

## 2021-05-04 ENCOUNTER — Other Ambulatory Visit: Payer: Self-pay | Admitting: Pharmacy Technician

## 2021-05-04 VITALS — BP 108/68 | HR 90 | Ht 71.0 in | Wt 144.5 lb

## 2021-05-04 DIAGNOSIS — R195 Other fecal abnormalities: Secondary | ICD-10-CM

## 2021-05-04 DIAGNOSIS — D509 Iron deficiency anemia, unspecified: Secondary | ICD-10-CM

## 2021-05-04 MED ORDER — GOLYTELY 236 G PO SOLR: 4000.0000 mL | Freq: Once | ORAL | 0 refills | Status: AC

## 2021-05-04 NOTE — Patient Instructions (Addendum)
If you are age 68 or older, your body mass index should be between 23-30. Your Body mass index is 20.15 kg/m?Marland Kitchen If this is out of the aforementioned range listed, please consider follow up with your Primary Care Provider. ? ?If you are age 83 or younger, your body mass index should be between 19-25. Your Body mass index is 20.15 kg/m?Marland Kitchen If this is out of the aformentioned range listed, please consider follow up with your Primary Care Provider.  ? ?You have been scheduled for a colonoscopy. Please follow written instructions given to you at your visit today.  ?Please pick up your prep supplies at the pharmacy within the next 1-3 days. ?If you use inhalers (even only as needed), please bring them with you on the day of your procedure. ? ?Increase water to 60 oz a day. ? ?Increase stool softener to 2 times a day. ? ?The [facility] providers would like to encourage you to use [facility] to communicate with providers for non-urgent requests or questions.  Due to long hold times on the telephone, sending your provider a message by [facility] may be a faster and more efficient way to get a response.  Please allow 48 business hours for a response.  Please remember that this is for non-urgent requests.  ? ?It was a pleasure to see you today! ? ?Thank you for trusting me with your gastrointestinal care!   ? ?Tye Savoy, NP  ?

## 2021-05-04 NOTE — Progress Notes (Addendum)
? ? ?Assessment :   ? ?66 yo male with iron deficiency anemia / dark hemoccult positive stool ( today) -  Hgb has slowly declined over last 6 months from 11 to 8. Ferritin 5. No associated GI symptoms  ?Constipation with hard stools - likely combination of inadequate fluid intake and opioids. ?Presumed history of GERD ( he doesn't know). 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 On Zytiga ?COPD - occasionally still smokes. No respiratory distress today but pulse ox reading 79-80%.  ?PAD- followed by Vascular Surgery, Suspected arterial insuffiencey of left leg, workup ongoing. .  ? ?Plan:    ? ?Takes one stool softener a day . Recommend increasing to 2 a day ?Increase water intake to 60 oz / day if possible.  ?IV iron per Oncologist ?Avoid NSAIDS ( not taking) ?Continue pantoprazole ?For further evaluation of IDA / FOBT+ will schedule for EGD and colonoscopy to be done at the hospital given COPD / hypoxia. The risks and benefits of EGD with possible biopsies and colonoscopy with possible biopsy and polypectomy were discussed with the patient who agrees to proceed.  ? ?ADDENDUM:  ?We contacted the the physician in [facility] who patient believes did his last colonoscopy. 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 Daughter doesn't think patient has ever had a colonoscopy.    ? ? ?History of Present Illness:  ? ?Patient profile:  ?Dustin Alvarez is a 66 y.o. male new to practice with a past medical history significant for metastatic prostate cancer, COPD, PAD, HTN, GERD, iron deficiency anemia.  See PMH below for any additional history. ? ?Referred by Oncology for iron deficiency.  ? ?Chief complaint: none  ? ?Dustin Alvarez is here with his daughter. He reports having had a colonoscopy in [place], [place] in 2020 ( ? Reason) with Dustin Shepherd, MD. He believes some polyps were removed. No Tidioute of colon cancer. He understands that his anemia  has worsened and there is concern for a GI bleed. He may pass a dark but not black stool about once a month. No bismuth use, not on iron. He doesn't take NSAIDS. He is on a daily PPI. He has no abdominal pain, nausea  /  vomiting,. Weight is stable. No history of PUD. He does struggle with constipation / straining and about a month ago saw a small amount of blood in his stool. He hasn't had any hematuria, epistaxis or bleeding elsewhere.  ? ?Patient has no GERD symptoms. However he thinks he did at one time because he takes pantoprazole every day. ?  ?He is to get first iron infusion soon.  ? ? ?Previous Labs / Imaging:: ? ?  Latest Ref Rng & Units 05/02/2021  ?  9:15 AM 04/13/2021  ?  1:31 PM 04/04/2021  ? 12:18 PM  ?CBC  ?WBC 4.0 - 10.5 K/uL 4.9   5.2   6.8    ?Hemoglobin 13.0 - 17.0 g/dL 8.0   8.8   8.5    ?Hematocrit 39.0 - 52.0 % 26.9   30.1   28.1    ?Platelets 150 - 400 K/uL 380   408   390    ? ? ?No results found for: LIPASE ? ?  Latest Ref Rng & Units 05/02/2021  ?  9:15 AM 04/04/2021  ? 12:18 PM 01/17/2021  ? 10:10 AM  ?CMP  ?Glucose 70 - 99 mg/dL 97   111  102    ?BUN 8 - 23 mg/dL '10   15   14    '$ ?Creatinine 0.61 - 1.24 mg/dL 0.80   0.94   0.83    ?Sodium 135 - 145 mmol/L 138   136   141    ?Potassium 3.5 - 5.1 mmol/L 3.3   4.1   3.6    ?Chloride 98 - 111 mmol/L 101   98   103    ?CO2 22 - 32 mmol/L 30   32   28    ?Calcium 8.9 - 10.3 mg/dL 9.0   8.9   8.9    ?Total Protein 6.5 - 8.1 g/dL 7.3   7.6   7.5    ?Total Bilirubin 0.3 - 1.2 mg/dL 0.6   0.5   0.3    ?Alkaline Phos 38 - 126 U/L 90   86   87    ?AST 15 - 41 U/L '8   9   9    '$ ?ALT 0 - 44 U/L '8   6   6    '$ ? ? ? ? ?Previous GI Evaluations;   ? ?Endoscopies: ?Colonoscopy in [place] ? ? ?Imaging:  ?NM Bone Scan Whole Body ?CLINICAL DATA:  Prostate carcinoma ? ?EXAM: ?NUCLEAR MEDICINE WHOLE BODY BONE SCAN ? ?TECHNIQUE: ?Whole body anterior and posterior images were obtained approximately ?3 hours after intravenous injection of  radiopharmaceutical. ? ?RADIOPHARMACEUTICALS:  19.1 mCi Technetium-41mMDP IV ? ?COMPARISON:  None. ? ?FINDINGS: ?There is small focal area of tracer uptake in the lateral aspect of ?left seventh rib. This finding is seen only in the anterior images. ?In the CT done on 04/04/2021, mild deformity was seen in the lateral ?aspect of left seventh rib possibly suggesting healing fracture. ?There is patchy increased tracer uptake in the sacrum, more so on ?the right side. Subtle increased uptake in the area of metatarsal ?base in the right foot may be residual from previous injury. No ?other focal abnormality is seen in the axial and visualized portions ?of the appendicular skeleton. There is normal activity in the ?kidneys and urinary bladder. ? ?IMPRESSION: ?There are patchy areas of increased uptake in the sacrum suggesting ?metastatic disease. ? ?There is small focus of tracer uptake in the lateral aspect of left ?seventh rib which may be due to healing fracture. Less likely ?possibility would be metastatic disease. ? ?Electronically Signed ?  By: PElmer PickerM.D. ?  On: 04/06/2021 14:36 ?CT CHEST ABDOMEN PELVIS W CONTRAST ?CLINICAL DATA:  Prostate cancer.  Restaging.  * onc * ? ?EXAM: ?CT CHEST, ABDOMEN, AND PELVIS WITH CONTRAST ? ?TECHNIQUE: ?Multidetector CT imaging of the chest, abdomen and pelvis was ?performed following the standard protocol during bolus ?administration of intravenous contrast. ? ?RADIATION DOSE REDUCTION: This exam was performed according to the ?departmental dose-optimization program which includes automated ?exposure control, adjustment of the mA and/or kV according to ?patient size and/or use of iterative reconstruction technique. ? ?CONTRAST:  1075mOMNIPAQUE IOHEXOL 300 MG/ML  SOLN ? ?COMPARISON:  10/06/2020 ? ?FINDINGS: ?CT CHEST FINDINGS ? ?Cardiovascular: The heart size is normal. No substantial pericardial ?effusion. Coronary artery calcification is evident.  Mild ?atherosclerotic calcification is noted in the wall of the thoracic ?aorta. ? ?Mediastinum/Nodes: No mediastinal lymphadenopathy. No evidence for ?gross hilar lymphadenopathy although assessment is limited by the ?lack of intravenous contrast on the current study. The esophagus has ?normal imaging features. There is no axillary lymphadenopathy. ? ?Lungs/Pleura: Centrilobular and paraseptal emphysema evident. No  new ?suspicious pulmonary nodule or mass. No focal airspace ?consolidation. No pleural effusion. ? ?Musculoskeletal: Scattered sclerotic bone lesions are similar to ?prior. Index lesion in the T10 vertebral body measured previously ?1.6 x 1.2 cm is stable in the interval. ? ?CT ABDOMEN PELVIS FINDINGS ? ?Hepatobiliary: No suspicious focal abnormality within the liver ?parenchyma. There is no evidence for gallstones, gallbladder wall ?thickening, or pericholecystic fluid. No intrahepatic or ?extrahepatic biliary dilation. ? ?Pancreas: No focal mass lesion. No dilatation of the main duct. No ?intraparenchymal cyst. No peripancreatic edema. ? ?Spleen: No splenomegaly. No focal mass lesion. ? ?Adrenals/Urinary Tract: No adrenal nodule or mass. Small cyst lower ?pole right kidney is stable in the interval. Left kidney ?unremarkable. No evidence for hydroureter. The urinary bladder ?appears normal for the degree of distention. ? ?Stomach/Bowel: Stomach is unremarkable. No gastric wall thickening. ?No evidence of outlet obstruction. Duodenum is normally positioned ?as is the ligament of Treitz. No small bowel wall thickening. No ?small bowel dilatation. The terminal ileum is normal. No gross ?colonic mass. No colonic wall thickening. ? ?Vascular/Lymphatic: There is mild atherosclerotic calcification of ?the abdominal aorta without aneurysm. There is no gastrohepatic or ?hepatoduodenal ligament lymphadenopathy. No retroperitoneal or ?mesenteric lymphadenopathy. Small lymph nodes in the gastrohepatic ?ligament  are similar to prior. No pelvic sidewall lymphadenopathy. ?Left femoral vein does not opacify, as before. ? ?Reproductive: The prostate gland and seminal vesicles are ?unremarkable. ? ?Other: No intraperitoneal free fluid. ? ?Musculoskeletal: Het

## 2021-05-08 ENCOUNTER — Encounter: Payer: Self-pay | Admitting: Vascular Surgery

## 2021-05-08 ENCOUNTER — Other Ambulatory Visit: Payer: Self-pay

## 2021-05-08 ENCOUNTER — Ambulatory Visit (INDEPENDENT_AMBULATORY_CARE_PROVIDER_SITE_OTHER): Payer: Medicare Other | Admitting: Vascular Surgery

## 2021-05-08 VITALS — BP 120/75 | HR 87 | Temp 97.7°F | Resp 18 | Ht 71.0 in | Wt 144.0 lb

## 2021-05-08 DIAGNOSIS — I739 Peripheral vascular disease, unspecified: Secondary | ICD-10-CM | POA: Diagnosis not present

## 2021-05-08 NOTE — Progress Notes (Signed)
? ? ?Patient name: Dustin Alvarez MRN: [id number] DOB: 10/04/1955 Sex: male ? ?REASON FOR CONSULT: Triage visit, discuss left leg bypass cancelled last year ? ?HPI: ?Dustin Alvarez is a 66 y.o. male, with history of metastatic prostate cancer and COPD who presents to discuss left leg bypass that he deferred last year.  He was initally referred by Dr. Lorenso Courier with hem onc.  He had a CT on 10/09/2020 with interval occlusion of the left SFA but patent profunda.  We performed left leg angiogram last year and then recommended a left common femoral to above-knee popliteal bypass.  This was going to be with prosthetic graft given he had no saphenous vein or other upper arm vein for conduit.  Patient canceled his bypass and did not want a plastic bypass. ? ?He is here today with his daughter and feels that he wants surgery now.  He states he has no pain in his legs when he walks.  No pain in his feet.  He has numbness in both feet that is chronic.  He wants the bypass and is hoping it improves his unsteadiness and he walks with a cane.  He does have a black discoloration to the tip of the left great toe but both him and his daughter state this has been present for years and has never turned into a wound. ? ?Past Medical History:  ?Diagnosis Date  ? Asthma   ? COPD (chronic obstructive pulmonary disease) ([facility])   ? GERD (gastroesophageal reflux disease)   ? Hypertension   ? Prostate cancer ([facility])   ? ? ?Past Surgical History:  ?Procedure Laterality Date  ? ABDOMINAL AORTOGRAM W/LOWER EXTREMITY N/A 10/26/2020  ? Procedure: ABDOMINAL AORTOGRAM W/LOWER EXTREMITY;  Surgeon: Marty Heck, MD;  Location: [facility];  Service: Cardiovascular;  Laterality: N/A;  ? WRIST SURGERY Left 2017  ? ? ?Family History  ?Problem Relation Age of Onset  ? COPD Father   ? Diabetes Sister   ? Diabetes Brother   ? Breast cancer Neg Hx   ? Prostate cancer Neg Hx   ? Colon cancer Neg Hx   ? Pancreatic cancer Neg Hx   ? Stomach cancer  Neg Hx   ? Esophageal cancer Neg Hx   ? ? ?SOCIAL HISTORY: ?Social History  ? ?Socioeconomic History  ? Marital status: Widowed  ?  Spouse name: Not on file  ? Number of children: Not on file  ? Years of education: Not on file  ? Highest education level: Not on file  ?Occupational History  ? Not on file  ?Tobacco Use  ? Smoking status: Some Days  ?  Packs/day: 1.00  ?  Types: Cigarettes  ? Smokeless tobacco: Never  ? Tobacco comments:  ?  almost 1 pkd. Wants patches   ?Vaping Use  ? Vaping Use: Never used  ?Substance and Sexual Activity  ? Alcohol use: Yes  ?  Comment: 1-2 drinks per week.  ? Drug use: Not Currently  ? Sexual activity: Not Currently  ?Other Topics Concern  ? Not on file  ?Social History Narrative  ? Not on file  ? ?Social Determinants of Health  ? ?Financial Resource Strain: Not on file  ?Food Insecurity: Not on file  ?Transportation Needs: Not on file  ?Physical Activity: Not on file  ?Stress: Not on file  ?Social Connections: Not on file  ?Intimate Partner Violence: Not on file  ? ? ?Allergies  ?Allergen Reactions  ? Ace Inhibitors  Swelling  ?  lisinopril  ? Lisinopril Swelling  ? ? ?Current Outpatient Medications  ?Medication Sig Dispense Refill  ? abiraterone acetate (ZYTIGA) 250 MG tablet TAKE 4 TABLETS BY MOUTH DAILY. TAKE ON AN EMPTY STOMACH 1 HOUR BEFORE OR 2 HOURS AFTER A MEAL 120 tablet 2  ? albuterol (VENTOLIN HFA) 108 (90 Base) MCG/ACT inhaler Inhale 2 puffs into the lungs every 6 (six) hours as needed for wheezing or shortness of breath. 6.7 g 3  ? amLODipine (NORVASC) 5 MG tablet Take 1 tablet (5 mg total) by mouth daily. 90 tablet 1  ? calcium-vitamin D (OSCAL WITH D) 500-200 MG-UNIT tablet Take 1 tablet by mouth daily with breakfast. 90 tablet 3  ? finasteride (PROSCAR) 5 MG tablet Take 1 tablet (5 mg total) by mouth daily. 90 tablet 1  ? Fluticasone-Umeclidin-Vilant (TRELEGY ELLIPTA) 200-62.5-25 MCG/ACT AEPB Use once daily 1 each 3  ? gabapentin (NEURONTIN) 300 MG capsule Take 2  capsules (600 mg total) by mouth 2 (two) times daily. 120 capsule 2  ? hydrochlorothiazide (MICROZIDE) 12.5 MG capsule Take 12.5 mg by mouth daily.    ? nicotine (NICOTROL) 10 MG inhaler Inhale 1 Cartridge (1 continuous puffing total) into the lungs as needed for smoking cessation. 42 each 0  ? oxyCODONE (OXY IR/ROXICODONE) 5 MG immediate release tablet TAKE 1 TABLET BY MOUTH EVERY 4 HOURS AS NEEDED 180 tablet 0  ? oxyCODONE ER (XTAMPZA ER) 36 MG C12A Take 1 capsule by mouth every 12 hours as needed. 60 capsule 0  ? pantoprazole (PROTONIX) 40 MG tablet Take 1 tablet (40 mg total) by mouth daily. 90 tablet 3  ? predniSONE (DELTASONE) 5 MG tablet Take 1 tablet (5 mg total) by mouth daily with breakfast. (do not take prior to starting Abiraterone) 90 tablet 1  ? sildenafil (VIAGRA) 100 MG tablet Take 1 tablet (100 mg total) by mouth daily as needed for erectile dysfunction. 30 tablet 0  ? tamsulosin (FLOMAX) 0.4 MG CAPS capsule Take 1 capsule (0.4 mg total) by mouth in the morning and at bedtime. 180 capsule 0  ? traZODone (DESYREL) 100 MG tablet Take 0.5 tablets (50 mg total) by mouth at bedtime. 90 tablet 1  ? ?No current facility-administered medications for this visit.  ? ? ?REVIEW OF SYSTEMS:  ?'[X]'$  denotes positive finding, '[ ]'$  denotes negative finding ?Cardiac  Comments:  ?Chest pain or chest pressure:    ?Shortness of breath upon exertion:    ?Short of breath when lying flat:    ?Irregular heart rhythm:    ?    ?Vascular    ?Pain in calf, thigh, or hip brought on by ambulation:    ?Pain in feet at night that wakes you up from your sleep:     ?Blood clot in your veins:    ?Leg swelling:     ?    ?Pulmonary    ?Oxygen at home:    ?Productive cough:     ?Wheezing:     ?    ?Neurologic    ?Sudden weakness in arms or legs:     ?Sudden numbness in arms or legs:     ?Sudden onset of difficulty speaking or slurred speech:    ?Temporary loss of vision in one eye:     ?Problems with dizziness:     ?    ?Gastrointestinal     ?Blood in stool:     ?Vomited blood:     ?    ?Genitourinary    ?  Burning when urinating:     ?Blood in urine:    ?    ?Psychiatric    ?Major depression:     ?    ?Hematologic    ?Bleeding problems:    ?Problems with blood clotting too easily:    ?    ?Skin    ?Rashes or ulcers:    ?    ?Constitutional    ?Fever or chills:    ? ? ?PHYSICAL EXAM: ?Vitals:  ? 05/08/21 1433  ?BP: 120/75  ?Pulse: 87  ?Resp: 18  ?Temp: 97.7 ?F (36.5 ?C)  ?TempSrc: Temporal  ?SpO2: (!) 87%  ?Weight: 144 lb (65.3 kg)  ?Height: '5\' 11"'$  (1.803 m)  ? ? ?GENERAL: The patient is a well-nourished male, in no acute distress. The vital signs are documented above. ?CARDIAC: There is a regular rate and rhythm.  ?VASCULAR:  ?Palpable femoral pulses bilaterally ?Right DP palpable ?Left pedal pulses nonpalpable ?Tip left great toe discolored. ?PULMONARY: No respiratory distress. ?ABDOMEN: Soft and non-tender. ?MUSCULOSKELETAL: There are no major deformities or cyanosis. ?NEUROLOGIC: No focal weakness or paresthesias are detected. ?PSYCHIATRIC: The patient has a normal affect. ? ? ? ? ?DATA:  ? ?ABIs 10/13/20 are 0.99 on the right triphasic and 0.46 on the left monophasic ? ?Assessment/Plan: ? ?66 year old male previously seen last year with left leg pain and underwent angiogram showing near flush left SFA occlusion and we recommended left common femoral to above-knee popliteal bypass.  Patient ultimately canceled the surgery when I discussed this would be with a plastic graft given no long segment vein for conduit.  He is here today as a triage visit to further discuss the bypass and has not been seen since last year.  No new studies were ordered today  In further discussing with the patient he wants a bypass to help with swelling and unsteadiness.  I discussed the left leg bypass will make his swelling worse and certainly unlikely to improve unsteadiness.  He denies any pain in the foot.  He has some discoloration of the left great toe but he states this  has been like that for years and never turns into an open wound.  I have recommended observation for now and we will see him in 3 months with ABIs.  Discussed if he gets any open wounds or starts havin

## 2021-05-09 ENCOUNTER — Ambulatory Visit (INDEPENDENT_AMBULATORY_CARE_PROVIDER_SITE_OTHER): Payer: Medicare Other

## 2021-05-09 VITALS — BP 107/72 | HR 96 | Temp 97.7°F | Resp 22 | Ht 71.0 in | Wt 148.8 lb

## 2021-05-09 DIAGNOSIS — D500 Iron deficiency anemia secondary to blood loss (chronic): Secondary | ICD-10-CM | POA: Diagnosis not present

## 2021-05-09 MED ORDER — SODIUM CHLORIDE 0.9 % IV SOLN
200.0000 mg | Freq: Once | INTRAVENOUS | Status: AC
  Administered 2021-05-09: 200 mg via INTRAVENOUS
  Filled 2021-05-09: qty 10

## 2021-05-09 NOTE — Progress Notes (Signed)
Diagnosis: Iron Deficiency Anemia ? ?Provider:  Marshell Garfinkel, MD ? ?Procedure: Infusion ? ?IV Type: Peripheral, IV Location: L Antecubital ? ?Venofer (Iron Sucrose), Dose: 200 mg ? ?Infusion Start Time: 9379 ? ?Infusion Stop Time: 0240 ? ?Post Infusion IV Care: Observation period completed and Peripheral IV Discontinued ? ?Discharge: Condition: Good, Destination: Home . AVS provided to patient.  ? ?Performed by:  Koren Shiver, RN  ?  ?

## 2021-05-11 ENCOUNTER — Other Ambulatory Visit: Payer: Self-pay | Admitting: *Deleted

## 2021-05-11 ENCOUNTER — Inpatient Hospital Stay: Payer: Medicare Other | Admitting: Nurse Practitioner

## 2021-05-11 ENCOUNTER — Telehealth: Payer: Self-pay

## 2021-05-11 DIAGNOSIS — M79604 Pain in right leg: Secondary | ICD-10-CM

## 2021-05-11 DIAGNOSIS — I739 Peripheral vascular disease, unspecified: Secondary | ICD-10-CM

## 2021-05-11 NOTE — Telephone Encounter (Signed)
Dustin Alvarez was scheduled to meet with our office today but did not come to his appointment. I reached out to his daughter Dustin Alvarez to see if everything was okay. I also educated her on palliative care and that we are here to help with symptom management as well as Advance Care Planning. She was grateful for the call and stated that they would definitely want to see our office to help with his pain management. They were unaware of our role on their team and that was why they didn't come today. Rescheduling patient for Wednesday, April 5th at 9:30 am. ?

## 2021-05-12 NOTE — Progress Notes (Addendum)
Office Visit   Patient ID: Dustin Alvarez, male    DOB: May 18, 1955, 66 y.o.   MRN: [id number]   PCP: Elige Radon, MD   Subjective:   Dustin Alvarez is a 66 y.o. year old male with prostate cancer with bone mets, hypertension, COPD, tobacco use disorder, and PAD who presents for routine follow up of hypertension and COPD.   Hypertension 05/2020: blood pressure well controlled. Continued on amlodipine 5mg  and hctz 12.5mg  07/2020: endorsed orthostasis. Discontinued hctz.  12/2020: hctz had been restarted sometime since our last office visit. Was experiencing recurrent orthostasis. Discontinued hctz again.    COPD 12/2020: reported poor symptom control. Treglegy dose increased.   Interm hx since LOV in November 2022 04/15/2021: seen by oncology for f/u. No change in management of prostate cancer. Referred to urology due to ongoing ED sx despite viagra. Noted slow drop in hemoglobin 11>8. Ferritin 5. Referred to GI. 05/04/2021: seen by GI. Stool occult +. Colonoscopy/EGD scheduled for 05/22/21. 05/09/2021: Vascular surgery visit. [place] now wishing to undergo surgery to help with unsteadiness and leg swelling. Vascular did not feel that surgical intervention would help alleviate these sx so will continue to defer surgery. Recommended 3 month follow up for ongoing ABI monitoring.    Today's visit  Hypertension: Endorses adherence with amlodipine 5 mg daily.  Denies issues with dizziness or lightheadedness.  COPD: Denies recent exacerbations.  Reports adherence with Trelegy.  He requests referral to pulmonology.  Right lower extremity swelling: He reports 2-week history of right lower extremity swelling.  He reports pain in the right leg, more than usual.  Denies chest pain, palpitations, or shortness of breath.  Denies history of coagulopathy or prior DVT.  Tobacco use disorder: Currently smoking 0.5 packs/day.  He is interested in smoking cessation.  He has tried nicotine patches in  the past and has remained tobacco free for up to 7 days prior to relapsing. ACTIVE MEDICATIONS   Outpatient Medications Prior to Visit  Medication Sig   abiraterone acetate (ZYTIGA) 250 MG tablet TAKE 4 TABLETS BY MOUTH DAILY. TAKE ON AN EMPTY STOMACH 1 HOUR BEFORE OR 2 HOURS AFTER A MEAL   albuterol (VENTOLIN HFA) 108 (90 Base) MCG/ACT inhaler Inhale 2 puffs into the lungs every 6 (six) hours as needed for wheezing or shortness of breath.   amLODipine (NORVASC) 5 MG tablet Take 1 tablet (5 mg total) by mouth daily.   calcium-vitamin D (OSCAL WITH D) 500-200 MG-UNIT tablet Take 1 tablet by mouth daily with breakfast.   finasteride (PROSCAR) 5 MG tablet Take 1 tablet (5 mg total) by mouth daily.   Fluticasone-Umeclidin-Vilant (TRELEGY ELLIPTA) 200-62.5-25 MCG/ACT AEPB Use once daily   gabapentin (NEURONTIN) 300 MG capsule Take 2 capsules (600 mg total) by mouth 2 (two) times daily.   oxyCODONE (OXY IR/ROXICODONE) 5 MG immediate release tablet TAKE 1 TABLET BY MOUTH EVERY 4 HOURS AS NEEDED   oxyCODONE ER (XTAMPZA ER) 36 MG C12A Take 1 capsule by mouth every 12 hours as needed.   pantoprazole (PROTONIX) 40 MG tablet Take 1 tablet (40 mg total) by mouth daily.   predniSONE (DELTASONE) 5 MG tablet Take 1 tablet (5 mg total) by mouth daily with breakfast. (do not take prior to starting Abiraterone)   [DISCONTINUED] hydrochlorothiazide (MICROZIDE) 12.5 MG capsule Take 12.5 mg by mouth daily.   [DISCONTINUED] nicotine (NICOTROL) 10 MG inhaler Inhale 1 Cartridge (1 continuous puffing total) into the lungs as needed for smoking cessation.   [DISCONTINUED]  sildenafil (VIAGRA) 100 MG tablet Take 1 tablet (100 mg total) by mouth daily as needed for erectile dysfunction.   [DISCONTINUED] tamsulosin (FLOMAX) 0.4 MG CAPS capsule Take 1 capsule (0.4 mg total) by mouth in the morning and at bedtime.   [DISCONTINUED] traZODone (DESYREL) 100 MG tablet Take 0.5 tablets (50 mg total) by mouth at bedtime.   No  facility-administered medications prior to visit.     Objective:   BP (!) 117/55 (BP Location: Left Arm, Patient Position: Sitting, Cuff Size: Normal)   Pulse 96   Temp 98.2 F (36.8 C) (Oral)   Ht 5' 11.5" (1.816 m)   Wt 145 lb 14.4 oz (66.2 kg)   SpO2 93%   BMI 20.07 kg/m  Wt Readings from Last 3 Encounters:  05/16/21 145 lb (65.8 kg)  05/15/21 145 lb 14.4 oz (66.2 kg)  05/09/21 148 lb 12.8 oz (67.5 kg)    BP Readings from Last 3 Encounters:  05/16/21 98/66  05/15/21 (!) 117/55  05/09/21 107/72   General: Thin, frail appearing elderly gentleman sitting in the wheelchair Cardiac: Heart regular rate and rhythm.  Peripheral extremities are normal temperature.  Left pedal pulses chronically undetectable by palpation.  Right dorsalis pedis pulse intact.  +3 pitting in the right lower extremity extending from the foot to the mid leg. Pulm: Breathing comfortably on room air.  Lung sounds diminished throughout. Skin: Hyperpigmented skin lesion on the tip of the left first toe and left heel.  No open wounds or lesions.  Severe onychomycosis Health Maintenance:   Health Maintenance  Topic Date Due   COVID-19 Vaccine (1) Never done   Hepatitis C Screening  Never done   Zoster Vaccines- Shingrix (1 of 2) Never done   COLONOSCOPY (Pts 45-40yrs Insurance coverage will need to be confirmed)  Never done   Pneumonia Vaccine 61+ Years old (2 - PPSV23 if available, else PCV20) 10/31/2020   INFLUENZA VACCINE  09/11/2021   DEXA SCAN  09/02/2024   TETANUS/TDAP  10/31/2029   HPV VACCINES  Aged Out    Assessment & Plan:   Problem List Items Addressed This Visit       Cardiovascular and Mediastinum   Hypertension (Chronic)    Current medication: Amlodipine 5 mg Blood pressure is at goal in the office today--117/55. Denies adverse medication effects.  Hydrochlorothiazide was still on his medication list however, upon further clarification, he has not been taking this since December. -  Continue current management - Hydrochlorothiazide removed from medication list       Relevant Medications   sildenafil (VIAGRA) 100 MG tablet   PAD (peripheral artery disease) (HCC)    Symptoms remain stable.  Recently followed up with vascular surgery in March who recommended ongoing 42-month interval ABI checks at their office.  He understands to follow-up in their office if symptoms acutely worsen or he develops new wounds. Consider starting antiplatelet therapy once source of iron deficiency with resolved.       Relevant Medications   sildenafil (VIAGRA) 100 MG tablet     Respiratory   COPD (chronic obstructive pulmonary disease) (HCC) (Chronic)    Current medications: Trelegy Ellipta 200-62.5-25mcg/ACT AEPB CAT score: 23 No recent exacerbations. Currently smokes 0.5 ppd Plan -Continue current management -Check PFTs -Pulmonology referral placed per patient request -Lung cancer screening is up to date; last CT 03/2020 -Greater than 3 minutes of today's visit was spent on face to face tobacco cessation counseling. He would like to undergo smoking cessation. We  will try 21mg  nicotine patches along with nicotine lozenges. He was provided the 1-800-QUIT-NOW line.       Relevant Orders   Ambulatory referral to Pulmonology   Pulmonary function test     Other   Cancer associated pain   Healthcare maintenance    He is on long term prednisone as part of his prostate cancer regimen. Last DEXA was performed in 2021 at [facility] and did not show osteoporosis.  -Recommend repeating DEXA scans every 2 years while on prednisone--next due 08/2021.      Tobacco use   Iron deficiency anemia due to chronic blood loss    Hemoglobin was noted to be trending down since November on labs that were drawn at his oncology appointment in March. Labs consistent with iron deficiency. Oncology referred him to GI. Stool occult was noted to be positive at the GI visit.  -Scheduled for  EGD/colonoscopy 05/22/21 -Continue Venofer infusions as ordered by Dr. Leonides Schanz. Total of 5 doses ordered.       Localized swelling of right lower extremity - Primary    He reports 2w history of right lower extremity swelling. Well's score for DVT 4 = high risk for DVT: Active cancer, asymmetric calf swelling, unilateral No chest pain, tachycardia, hypoxia, hypotension to suggest acute PE Plan -Check venous duplex stat -Will defer empiric anticoagulation initiation today due to GI bleeding. If scan is positive, may need consideration for referral to IR, depending on clot size  Addendum 05/16/21 4:49 PM : no lower extremity DVT appreciated on duplex. I called and discussed this with his daughter. They were instructed to elevate his leg and can try low pressure compression socks (in setting of severe PAD). They will follow up if symptoms are not alleviated.       Relevant Orders   VAS [place] LOWER EXTREMITY VENOUS (DVT) (Completed)   RESOLVED: Hypokalemia    Resolved on repeat labs today. No further workup at this time.       Relevant Orders   Basic metabolic panel (Completed)     Return in about 3 months (around 08/14/2021) for Follow up of chronic medical conditions.   Pt discussed with Dr. Leo Rod, MD Internal Medicine Resident PGY-3 [facility] 05/16/2021 4:51 PM

## 2021-05-14 ENCOUNTER — Encounter (HOSPITAL_COMMUNITY): Payer: Self-pay | Admitting: Gastroenterology

## 2021-05-15 ENCOUNTER — Other Ambulatory Visit: Payer: Self-pay

## 2021-05-15 ENCOUNTER — Encounter: Payer: Self-pay | Admitting: Internal Medicine

## 2021-05-15 ENCOUNTER — Ambulatory Visit (INDEPENDENT_AMBULATORY_CARE_PROVIDER_SITE_OTHER): Payer: Medicare Other | Admitting: Internal Medicine

## 2021-05-15 VITALS — BP 117/55 | HR 96 | Temp 98.2°F | Ht 71.5 in | Wt 145.9 lb

## 2021-05-15 DIAGNOSIS — R2241 Localized swelling, mass and lump, right lower limb: Secondary | ICD-10-CM | POA: Diagnosis not present

## 2021-05-15 DIAGNOSIS — J449 Chronic obstructive pulmonary disease, unspecified: Secondary | ICD-10-CM | POA: Diagnosis not present

## 2021-05-15 DIAGNOSIS — E876 Hypokalemia: Secondary | ICD-10-CM

## 2021-05-15 DIAGNOSIS — F17210 Nicotine dependence, cigarettes, uncomplicated: Secondary | ICD-10-CM

## 2021-05-15 DIAGNOSIS — I739 Peripheral vascular disease, unspecified: Secondary | ICD-10-CM

## 2021-05-15 DIAGNOSIS — Z0000 Encounter for general adult medical examination without abnormal findings: Secondary | ICD-10-CM

## 2021-05-15 DIAGNOSIS — I10 Essential (primary) hypertension: Secondary | ICD-10-CM | POA: Diagnosis not present

## 2021-05-15 DIAGNOSIS — I890 Lymphedema, not elsewhere classified: Secondary | ICD-10-CM | POA: Insufficient documentation

## 2021-05-15 DIAGNOSIS — G893 Neoplasm related pain (acute) (chronic): Secondary | ICD-10-CM

## 2021-05-15 DIAGNOSIS — D500 Iron deficiency anemia secondary to blood loss (chronic): Secondary | ICD-10-CM

## 2021-05-15 DIAGNOSIS — Z720 Tobacco use: Secondary | ICD-10-CM

## 2021-05-15 MED ORDER — TAMSULOSIN HCL 0.4 MG PO CAPS: 0.4000 mg | ORAL_CAPSULE | Freq: Two times a day (BID) | ORAL | 0 refills | Status: DC

## 2021-05-15 MED ORDER — SILDENAFIL CITRATE 100 MG PO TABS: 100.0000 mg | ORAL_TABLET | Freq: Every day | ORAL | 0 refills | Status: DC | PRN

## 2021-05-15 MED ORDER — TRAZODONE HCL 100 MG PO TABS: 50.0000 mg | ORAL_TABLET | Freq: Every day | ORAL | 1 refills | Status: DC

## 2021-05-15 NOTE — Assessment & Plan Note (Addendum)
He is on long term prednisone as part of his prostate cancer regimen. Last DEXA was performed in 2021 at [facility] and did not show osteoporosis.  ?-Recommend repeating DEXA scans every 2 years while on prednisone--next due 08/2021. ?

## 2021-05-15 NOTE — Assessment & Plan Note (Addendum)
Symptoms remain stable.  Recently followed up with vascular surgery in March who recommended ongoing 54-monthinterval ABI checks at their office.  He understands to follow-up in their office if symptoms acutely worsen or he develops new wounds. Consider starting antiplatelet therapy once source of iron deficiency with resolved.  ?

## 2021-05-15 NOTE — Patient Instructions (Signed)
[name], it was a pleasure seeing you today! ? ?Today we discussed: ? ?Hypertension: your blood pressure looks good.  ?Continue amlodipine ?Right leg swelling ?We will get an ultrasound of your leg to look for a blood clot. I will call you with the results.  ?COPD ?Pulmonary Function Testing ?Referral to pulmonology ?Smoking cessation ?I recommend using '21mg'$  nicotine patches as well as nicotine lozenges for when you have cravings. ?Call 1-800-QUIT-NOW to obtain free nicotine replacement ? ? ? ?Follow-up: 3 months  ? ?Please make sure to arrive 15 minutes prior to your next appointment. If you arrive late, you may be asked to reschedule.  ? ?We look forward to seeing you next time. Please call our clinic at [phone] if you have any questions or concerns. The best time to call is Monday-Friday from 9am-4pm, but there is someone available 24/7. If after hours or the weekend, call the main hospital number and ask for the Internal Medicine Resident On-Call. If you need medication refills, please notify your pharmacy one week in advance and they will send [place] a request. ? ?Thank you for letting us take part in your care. Wishing you the best! ? ? ?

## 2021-05-15 NOTE — Assessment & Plan Note (Addendum)
He reports 2w history of right lower extremity swelling. Well's score for DVT 4 = high risk for DVT: Active cancer, asymmetric calf swelling, unilateral ?No chest pain, tachycardia, hypoxia, hypotension to suggest acute PE ?Plan ?-Check venous duplex stat ?-Will defer empiric anticoagulation initiation today due to GI bleeding. If scan is positive, may need consideration for referral to IR, depending on clot size ? ?Addendum 05/16/21 4:49 PM : no lower extremity DVT appreciated on duplex. I called and discussed this with his daughter. They were instructed to elevate his leg and can try low pressure compression socks (in setting of severe PAD). They will follow up if symptoms are not alleviated.  ?

## 2021-05-15 NOTE — Assessment & Plan Note (Addendum)
Resolved on repeat labs today. No further workup at this time.  ?

## 2021-05-15 NOTE — Assessment & Plan Note (Addendum)
Current medications: Trelegy Ellipta 200-62.5-25mcg/ACT AEPB ?CAT score: 23 ?No recent exacerbations. ?Currently smokes 0.5 ppd ?Plan ?-Continue current management ?-Check PFTs ?-Pulmonology referral placed per patient request ?-Lung cancer screening is up to date; last CT 03/2020 ?-Greater than 3 minutes of today's visit was spent on face to face tobacco cessation counseling. He would like to undergo smoking cessation. We will try '21mg'$  nicotine patches along with nicotine lozenges. He was provided the 1-800-QUIT-NOW line.  ?

## 2021-05-15 NOTE — Assessment & Plan Note (Deleted)
Continues to smoke roughly one pack of cigarettes per day.  ?

## 2021-05-15 NOTE — Assessment & Plan Note (Addendum)
Current medication: Amlodipine 5 mg ?Blood pressure is at goal in the office today--117/55. ?Denies adverse medication effects.  Hydrochlorothiazide was still on his medication list however, upon further clarification, he has not been taking this since December. ?- Continue current management ?- Hydrochlorothiazide removed from medication list ? ?

## 2021-05-15 NOTE — Assessment & Plan Note (Signed)
Hemoglobin was noted to be trending down since November on labs that were drawn at his oncology appointment in March. Labs consistent with iron deficiency. Oncology referred him to GI. Stool occult was noted to be positive at the GI visit.  ?-Scheduled for EGD/colonoscopy 05/22/21 ?-Continue Venofer infusions as ordered by Dr. Lorenso Courier. Total of 5 doses ordered.  ?

## 2021-05-16 ENCOUNTER — Inpatient Hospital Stay: Payer: Medicare Other | Admitting: Nurse Practitioner

## 2021-05-16 ENCOUNTER — Ambulatory Visit (INDEPENDENT_AMBULATORY_CARE_PROVIDER_SITE_OTHER): Payer: Medicare Other

## 2021-05-16 ENCOUNTER — Ambulatory Visit (HOSPITAL_COMMUNITY)
Admission: RE | Admit: 2021-05-16 | Discharge: 2021-05-16 | Disposition: A | Discharge status: Home / Self Care | Payer: Medicare Other | Source: Ambulatory Visit | Attending: Internal Medicine | Admitting: Internal Medicine

## 2021-05-16 ENCOUNTER — Telehealth: Payer: Self-pay

## 2021-05-16 VITALS — BP 98/66 | HR 91 | Temp 98.3°F | Resp 18 | Ht 71.0 in | Wt 145.0 lb

## 2021-05-16 DIAGNOSIS — D500 Iron deficiency anemia secondary to blood loss (chronic): Secondary | ICD-10-CM | POA: Diagnosis not present

## 2021-05-16 DIAGNOSIS — R2241 Localized swelling, mass and lump, right lower limb: Secondary | ICD-10-CM | POA: Diagnosis present

## 2021-05-16 DIAGNOSIS — D509 Iron deficiency anemia, unspecified: Secondary | ICD-10-CM

## 2021-05-16 MED ORDER — SODIUM CHLORIDE 0.9 % IV SOLN
200.0000 mg | Freq: Once | INTRAVENOUS | Status: AC
  Administered 2021-05-16: 200 mg via INTRAVENOUS
  Filled 2021-05-16: qty 10

## 2021-05-16 NOTE — Progress Notes (Signed)
Results and follow up recommendations reviewed with pt's daughter.

## 2021-05-16 NOTE — Progress Notes (Signed)
Right lower extremity venous duplex completed. ?Refer to "CV Proc" under chart review to view preliminary results. ? ?05/16/2021 4:03 PM ?Kelby Aline., MHA, RVT, RDCS, RDMS  p ?

## 2021-05-16 NOTE — Telephone Encounter (Signed)
-----   Message from Willia Craze, NP sent at 05/16/2021  2:01 PM EDT ----- ?Beth, please see if patient can come in for a CBC in am. Would like to make sure hgb hasn't continued to decline making him unsafe for EGD / colon on 4/11. Thanks ? ?

## 2021-05-16 NOTE — Progress Notes (Signed)
Diagnosis: Iron Deficiency Anemia ? ?Provider:  Marshell Garfinkel, MD ? ?Procedure: Infusion ? ?IV Type: Peripheral, IV Location: R Forearm ? ?Venofer (Iron Sucrose), 200, Dose: 200 mg ? ?Infusion Start Time: 10.50 ? ?Infusion Stop Time: 11.10 ? ?Post Infusion IV Care: Peripheral IV Discontinued ? ?Discharge: Condition: Good, Destination: Home . AVS provided to patient.  ? ?Performed by:  Emmet Messer, RN  ?  ?

## 2021-05-16 NOTE — Telephone Encounter (Signed)
Spoke with the patient's daughter. She will bring him tomorrow for labs. Order in [place]. ?

## 2021-05-16 NOTE — Progress Notes (Signed)
____________________________________________________________ ? ?Attending physician addendum: ? ?Thank you for sending this case to me. ?I have reviewed the entire note and agree with the plan. ? ?He is on my schedule at [facility] for EGD/colonoscopy on 05/22/2021.  We will get a stat CBC in the endoscopy preprocedure area. ?He is scheduled for an iron infusion this week. ? ?He needs a referral to [place] [facility] for his COPD and your documented hypoxia during the clinic visit. ? ?Wilfrid Lund, MD ? ?____________________________________________________________ ? ?

## 2021-05-17 ENCOUNTER — Inpatient Hospital Stay: Payer: Medicare Other | Attending: Hematology and Oncology | Admitting: Nurse Practitioner

## 2021-05-17 ENCOUNTER — Other Ambulatory Visit: Payer: Self-pay

## 2021-05-17 ENCOUNTER — Encounter: Payer: Self-pay | Admitting: Hematology and Oncology

## 2021-05-17 ENCOUNTER — Other Ambulatory Visit (INDEPENDENT_AMBULATORY_CARE_PROVIDER_SITE_OTHER): Payer: Medicare Other

## 2021-05-17 ENCOUNTER — Other Ambulatory Visit (HOSPITAL_COMMUNITY): Payer: Self-pay

## 2021-05-17 ENCOUNTER — Encounter: Payer: Self-pay | Admitting: Nurse Practitioner

## 2021-05-17 VITALS — BP 103/60 | HR 79 | Temp 97.8°F | Resp 18 | Ht 71.0 in | Wt 142.7 lb

## 2021-05-17 DIAGNOSIS — C7951 Secondary malignant neoplasm of bone: Secondary | ICD-10-CM | POA: Insufficient documentation

## 2021-05-17 DIAGNOSIS — C61 Malignant neoplasm of prostate: Secondary | ICD-10-CM | POA: Diagnosis present

## 2021-05-17 DIAGNOSIS — Z515 Encounter for palliative care: Secondary | ICD-10-CM | POA: Insufficient documentation

## 2021-05-17 DIAGNOSIS — G893 Neoplasm related pain (acute) (chronic): Secondary | ICD-10-CM | POA: Insufficient documentation

## 2021-05-17 DIAGNOSIS — R530 Neoplastic (malignant) related fatigue: Secondary | ICD-10-CM

## 2021-05-17 DIAGNOSIS — K5903 Drug induced constipation: Secondary | ICD-10-CM

## 2021-05-17 DIAGNOSIS — K5900 Constipation, unspecified: Secondary | ICD-10-CM | POA: Diagnosis not present

## 2021-05-17 DIAGNOSIS — D509 Iron deficiency anemia, unspecified: Secondary | ICD-10-CM | POA: Diagnosis not present

## 2021-05-17 DIAGNOSIS — Z7189 Other specified counseling: Secondary | ICD-10-CM

## 2021-05-17 DIAGNOSIS — F17210 Nicotine dependence, cigarettes, uncomplicated: Secondary | ICD-10-CM | POA: Diagnosis not present

## 2021-05-17 LAB — CBC WITH DIFFERENTIAL/PLATELET
Basophils Absolute: 0 10*3/uL (ref 0.0–0.1)
Basophils Relative: 0.6 % (ref 0.0–3.0)
Eosinophils Absolute: 0 10*3/uL (ref 0.0–0.7)
Eosinophils Relative: 0.6 % (ref 0.0–5.0)
HCT: 26.7 % — ABNORMAL LOW (ref 39.0–52.0)
Hemoglobin: 7.9 g/dL — CL (ref 13.0–17.0)
Lymphocytes Relative: 12.4 % (ref 12.0–46.0)
Lymphs Abs: 0.7 10*3/uL (ref 0.7–4.0)
MCHC: 29.6 g/dL — ABNORMAL LOW (ref 30.0–36.0)
MCV: 66.6 fl — ABNORMAL LOW (ref 78.0–100.0)
Monocytes Absolute: 0.6 10*3/uL (ref 0.1–1.0)
Monocytes Relative: 9.6 % (ref 3.0–12.0)
Neutro Abs: 4.6 10*3/uL (ref 1.4–7.7)
Neutrophils Relative %: 76.8 % (ref 43.0–77.0)
Platelets: 320 10*3/uL (ref 150.0–400.0)
RBC: 4 Mil/uL — ABNORMAL LOW (ref 4.22–5.81)
RDW: 21.9 % — ABNORMAL HIGH (ref 11.5–15.5)
WBC: 5.9 10*3/uL (ref 4.0–10.5)

## 2021-05-17 MED ORDER — LIDOCAINE 5 % EX PTCH
1.0000 | MEDICATED_PATCH | CUTANEOUS | 0 refills | Status: DC
  Filled 2021-05-17: qty 30, 30d supply, fill #0

## 2021-05-17 MED ORDER — POLYETHYLENE GLYCOL 3350 17 G PO PACK
17.0000 g | PACK | Freq: Two times a day (BID) | ORAL | 2 refills | Status: AC
  Filled 2021-05-17: qty 60, 30d supply, fill #0

## 2021-05-17 NOTE — Progress Notes (Signed)
? ?  ?Palliative Medicine ?[place]  ?[phone] [phone] ? ? ?Name: Dustin Alvarez ?Date: 05/17/2021 ?MRN: [id number]  ?DOB: Dec 14, 1955 ? ?Patient Care Team: ?[name], MD as PCP - General (Internal Medicine) ?Cira Rue, RN Nurse Navigator as Registered Nurse (Medical Oncology)  ? ? ?REASON FOR CONSULTATION: ?Dustin Alvarez is a 66 y.o. 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  Palliative ask to see for symptom management and goals of care.  ? ? ?SOCIAL HISTORY:    ? reports that he has been smoking cigarettes. He has been smoking an average of 1 pack per day. He has never used smokeless tobacco. He reports current alcohol use. He reports that he does not currently use drugs. ? ?ADVANCE DIRECTIVES:  ?Patient reports his children would be his medical decision makers. Does have MOST on file. Personally reviewed an discussed with no requested changes.   ? ?CODE STATUS: DNR ? ?PAST MEDICAL HISTORY: ?Past Medical History:  ?Diagnosis Date  ? Asthma   ? COPD (chronic obstructive pulmonary disease) ([place])   ? GERD (gastroesophageal reflux disease)   ? Hypertension   ? Prostate cancer ([place])   ? ? ?PAST SURGICAL HISTORY:  ?Past Surgical History:  ?Procedure Laterality Date  ? ABDOMINAL AORTOGRAM W/LOWER EXTREMITY N/A 10/26/2020  ? Procedure: ABDOMINAL AORTOGRAM W/LOWER EXTREMITY;  Surgeon: Marty Heck, MD;  Location: [facility];  Service: Cardiovascular;  Laterality: N/A;  ? WRIST SURGERY Left 2017  ? ? ?HEMATOLOGY/ONCOLOGY HISTORY:  ?Oncology History  ? No history exists.  ? ? ?ALLERGIES:  is allergic to ace inhibitors and lisinopril. ? ?MEDICATIONS:  ?Current Outpatient Medications  ?Medication Sig Dispense Refill  ? abiraterone acetate (ZYTIGA) 250 MG tablet TAKE 4 TABLETS BY MOUTH  DAILY. TAKE ON AN EMPTY STOMACH 1 HOUR BEFORE OR 2 HOURS AFTER A MEAL 120 tablet 2  ? albuterol (VENTOLIN HFA) 108 (90 Base) MCG/ACT inhaler Inhale 2 puffs into the lungs every 6 (six) hours as needed for wheezing or shortness of breath. 6.7 g 3  ? amLODipine (NORVASC) 5 MG tablet Take 1 tablet (5 mg total) by mouth daily. 90 tablet 1  ? calcium-vitamin D (OSCAL WITH D) 500-200 MG-UNIT tablet Take 1 tablet by mouth daily with breakfast. 90 tablet 3  ? finasteride (PROSCAR) 5 MG tablet Take 1 tablet (5 mg total) by mouth daily. 90 tablet 1  ? Fluticasone-Umeclidin-Vilant (TRELEGY ELLIPTA) 200-62.5-25 MCG/ACT AEPB Use once daily 1 each 3  ? gabapentin (NEURONTIN) 300 MG capsule Take 2 capsules (600 mg total) by mouth 2 (two) times daily. 120 capsule 2  ? oxyCODONE (OXY IR/ROXICODONE) 5 MG immediate release tablet TAKE 1 TABLET BY MOUTH EVERY 4 HOURS AS NEEDED 180 tablet 0  ? oxyCODONE ER (XTAMPZA ER) 36 MG C12A Take 1 capsule by mouth every 12 hours as needed. 60 capsule 0  ? pantoprazole (PROTONIX) 40 MG tablet Take 1 tablet (40 mg total) by mouth daily. 90 tablet 3  ? predniSONE (DELTASONE) 5 MG tablet Take 1 tablet (5 mg total) by mouth daily with breakfast. (do not take prior to starting Abiraterone) 90 tablet 1  ? sildenafil (VIAGRA) 100 MG tablet Take 1 tablet (100 mg total) by mouth daily as needed for erectile dysfunction. 30 tablet 0  ? tamsulosin (FLOMAX) 0.4 MG CAPS capsule Take 1 capsule (0.4 mg total)  by mouth in the morning and at bedtime. 180 capsule 0  ? traZODone (DESYREL) 100 MG tablet Take 0.5 tablets (50 mg total) by mouth at bedtime. 90 tablet 1  ? ?No current facility-administered medications for this visit.  ? ? ?VITAL SIGNS: ?BP 103/60 (BP Location: Left Arm, Patient Position: Sitting)   Pulse 79   Temp 97.8 ?F (36.6 ?C) (Oral)   Resp 18   Ht '5\' 11"'$  (1.803 m)   Wt 142 lb 11.2 oz (64.7 kg)   SpO2 93%   BMI 19.90 kg/m?  ?Filed Weights  ? 05/17/21 1158  ?Weight: 142 lb 11.2 oz (64.7 kg)  ?   ?Estimated body mass index is 19.9 kg/m? as calculated from the following: ?  Height as of this encounter: '5\' 11"'$  (1.803 m). ?  Weight as of this encounter: 142 lb 11.2 oz (64.7 kg). ? ?LABS: ?CBC: ?   ?Component Value Date/Time  ? WBC 5.9 05/17/2021 1258  ? HGB 7.9 Repeated and verified X2. (LL) 05/17/2021 1258  ? HGB 8.0 (L) 05/02/2021 0915  ? HCT 26.7 (L) 05/17/2021 1258  ? PLT 320.0 05/17/2021 1258  ? PLT 380 05/02/2021 0915  ? MCV 66.6 (L) 05/17/2021 1258  ? NEUTROABS 4.6 05/17/2021 1258  ? LYMPHSABS 0.7 05/17/2021 1258  ? MONOABS 0.6 05/17/2021 1258  ? EOSABS 0.0 05/17/2021 1258  ? BASOSABS 0.0 05/17/2021 1258  ? ?Comprehensive Metabolic Panel: ?   ?Component Value Date/Time  ? NA 137 05/15/2021 1519  ? K 4.2 05/15/2021 1519  ? CL 98 05/15/2021 1519  ? CO2 25 05/15/2021 1519  ? BUN 11 05/15/2021 1519  ? CREATININE 0.68 (L) 05/15/2021 1519  ? CREATININE 0.80 05/02/2021 0915  ? GLUCOSE 89 05/15/2021 1519  ? GLUCOSE 97 05/02/2021 0915  ? CALCIUM WILL FOLLOW 05/15/2021 1519  ? AST 8 (L) 05/02/2021 0915  ? ALT 8 05/02/2021 0915  ? ALKPHOS 90 05/02/2021 0915  ? BILITOT 0.6 05/02/2021 0915  ? PROT 7.3 05/02/2021 0915  ? PROT 7.7 07/24/2020 1700  ? ALBUMIN 3.9 05/02/2021 0915  ? ALBUMIN 4.5 07/24/2020 1700  ? ? ?RADIOGRAPHIC STUDIES: ?VAS [place] LOWER EXTREMITY VENOUS (DVT) ? ?Result Date: 05/16/2021 ? Lower Venous DVT Study Patient Name:  Dustin Alvarez  Date of Exam:   05/16/2021 Medical Rec #: [id number]          Accession #:    [id number] Date of Birth: 01/29/1956          Patient Gender: M Patient Age:   53 years Exam Location:  [facility] Procedure:      VAS [place] LOWER EXTREMITY VENOUS (DVT) Referring Phys: Velna Ochs --------------------------------------------------------------------------------  Indications: Edema.  Comparison Study: No prior study Performing Technologist: Maudry Mayhew MHA, RDMS, RVT, RDCS  Examination Guidelines: A complete evaluation includes B-mode imaging, spectral Doppler,  color Doppler, and power Doppler as needed of all accessible portions of each vessel. Bilateral testing is considered an integral part of a complete examination. Limited examinations for reoccurring indications may be performed as noted. The reflux portion of the exam is performed with the patient in reverse Trendelenburg.  +---------+---------------+---------+-----------+----------+--------------+ RIGHT    CompressibilityPhasicitySpontaneityPropertiesThrombus Aging +---------+---------------+---------+-----------+----------+--------------+ CFV      Full           Yes      Yes                                 +---------+---------------+---------+-----------+----------+--------------+  SFJ      Full                                                        +---------+---------------+---------+-----------+----------+--------------+ FV Prox  Full                                                        +---------+---------------+---------+-----------+----------+--------------+ FV Mid   Full                                                        +---------+---------------+---------+-----------+----------+--------------+ FV DistalFull                                                        +---------+---------------+---------+-----------+----------+--------------+ PFV      Full                                                        +---------+---------------+---------+-----------+----------+--------------+ POP      Full           Yes      Yes                                 +---------+---------------+---------+-----------+----------+--------------+ PTV      Full                                                        +---------+---------------+---------+-----------+----------+--------------+ PERO     Full                                                        +---------+---------------+---------+-----------+----------+--------------+    +----+---------------+---------+-----------+----------+--------------+ LEFTCompressibilityPhasicitySpontaneityPropertiesThrombus Aging +----+---------------+---------+-----------+----------+--------------+ CFV Full           Yes      Yes                                 +----+---------------+---------+-----

## 2021-05-18 ENCOUNTER — Other Ambulatory Visit (HOSPITAL_COMMUNITY): Payer: Self-pay

## 2021-05-21 LAB — BASIC METABOLIC PANEL
BUN/Creatinine Ratio: 16 (ref 10–24)
BUN: 11 mg/dL (ref 8–27)
CO2: 25 mmol/L (ref 20–29)
Calcium: 9.8 mg/dL (ref 8.6–10.2)
Chloride: 98 mmol/L (ref 96–106)
Creatinine, Ser: 0.68 mg/dL — ABNORMAL LOW (ref 0.76–1.27)
Glucose: 89 mg/dL (ref 70–99)
Potassium: 4.2 mmol/L (ref 3.5–5.2)
Sodium: 137 mmol/L (ref 134–144)
eGFR: 102 mL/min/{1.73_m2} (ref >59)

## 2021-05-22 ENCOUNTER — Other Ambulatory Visit: Payer: Self-pay

## 2021-05-22 ENCOUNTER — Encounter (HOSPITAL_COMMUNITY): Payer: Self-pay | Admitting: Gastroenterology

## 2021-05-22 ENCOUNTER — Encounter (HOSPITAL_COMMUNITY)
Admission: RE | Disposition: A | Discharge status: Home / Self Care | Payer: Self-pay | Source: Ambulatory Visit | Attending: Gastroenterology

## 2021-05-22 ENCOUNTER — Ambulatory Visit (HOSPITAL_BASED_OUTPATIENT_CLINIC_OR_DEPARTMENT_OTHER): Payer: Medicare Other | Admitting: Anesthesiology

## 2021-05-22 ENCOUNTER — Ambulatory Visit (HOSPITAL_COMMUNITY)
Admission: RE | Admit: 2021-05-22 | Discharge: 2021-05-22 | Disposition: A | Discharge status: Home / Self Care | Payer: Medicare Other | Source: Ambulatory Visit | Attending: Gastroenterology | Admitting: Gastroenterology

## 2021-05-22 ENCOUNTER — Ambulatory Visit (HOSPITAL_COMMUNITY): Payer: Medicare Other | Admitting: Anesthesiology

## 2021-05-22 DIAGNOSIS — J449 Chronic obstructive pulmonary disease, unspecified: Secondary | ICD-10-CM | POA: Insufficient documentation

## 2021-05-22 DIAGNOSIS — K6289 Other specified diseases of anus and rectum: Secondary | ICD-10-CM

## 2021-05-22 DIAGNOSIS — K31819 Angiodysplasia of stomach and duodenum without bleeding: Secondary | ICD-10-CM | POA: Diagnosis not present

## 2021-05-22 DIAGNOSIS — C7951 Secondary malignant neoplasm of bone: Secondary | ICD-10-CM | POA: Insufficient documentation

## 2021-05-22 DIAGNOSIS — K648 Other hemorrhoids: Secondary | ICD-10-CM

## 2021-05-22 DIAGNOSIS — K5520 Angiodysplasia of colon without hemorrhage: Secondary | ICD-10-CM | POA: Diagnosis not present

## 2021-05-22 DIAGNOSIS — I10 Essential (primary) hypertension: Secondary | ICD-10-CM | POA: Diagnosis not present

## 2021-05-22 DIAGNOSIS — D500 Iron deficiency anemia secondary to blood loss (chronic): Secondary | ICD-10-CM | POA: Diagnosis not present

## 2021-05-22 DIAGNOSIS — F17200 Nicotine dependence, unspecified, uncomplicated: Secondary | ICD-10-CM | POA: Diagnosis not present

## 2021-05-22 DIAGNOSIS — D509 Iron deficiency anemia, unspecified: Secondary | ICD-10-CM

## 2021-05-22 DIAGNOSIS — Z8546 Personal history of malignant neoplasm of prostate: Secondary | ICD-10-CM | POA: Diagnosis not present

## 2021-05-22 DIAGNOSIS — K219 Gastro-esophageal reflux disease without esophagitis: Secondary | ICD-10-CM | POA: Insufficient documentation

## 2021-05-22 HISTORY — PX: ESOPHAGOGASTRODUODENOSCOPY (EGD) WITH PROPOFOL: SHX5813

## 2021-05-22 HISTORY — PX: HEMOSTASIS CLIP PLACEMENT: SHX6857

## 2021-05-22 HISTORY — PX: COLONOSCOPY WITH PROPOFOL: SHX5780

## 2021-05-22 HISTORY — PX: HOT HEMOSTASIS: SHX5433

## 2021-05-22 LAB — CBC
HCT: 33.4 % — ABNORMAL LOW (ref 39.0–52.0)
Hemoglobin: 9.8 g/dL — ABNORMAL LOW (ref 13.0–17.0)
MCH: 20.2 pg — ABNORMAL LOW (ref 26.0–34.0)
MCHC: 29.3 g/dL — ABNORMAL LOW (ref 30.0–36.0)
MCV: 68.7 fL — ABNORMAL LOW (ref 80.0–100.0)
Platelets: 443 10*3/uL — ABNORMAL HIGH (ref 150–400)
RBC: 4.86 MIL/uL (ref 4.22–5.81)
RDW: 24 % — ABNORMAL HIGH (ref 11.5–15.5)
WBC: 6.3 10*3/uL (ref 4.0–10.5)
nRBC: 0 % (ref 0.0–0.2)

## 2021-05-22 SURGERY — COLONOSCOPY WITH PROPOFOL
Anesthesia: Monitor Anesthesia Care

## 2021-05-22 MED ORDER — SODIUM CHLORIDE 0.9 % IV SOLN: INTRAVENOUS | Status: DC

## 2021-05-22 MED ORDER — LIDOCAINE 2% (20 MG/ML) 5 ML SYRINGE
INTRAMUSCULAR | Status: DC | PRN
  Administered 2021-05-22: 100 mg via INTRAVENOUS

## 2021-05-22 MED ORDER — PROPOFOL 500 MG/50ML IV EMUL
INTRAVENOUS | Status: DC | PRN
Start: 2021-05-22 — End: 2021-05-22
  Administered 2021-05-22: 125 ug/kg/min via INTRAVENOUS

## 2021-05-22 MED ORDER — PROPOFOL 10 MG/ML IV BOLUS
INTRAVENOUS | Status: AC
  Filled 2021-05-22: qty 20

## 2021-05-22 MED ORDER — GLUCAGON HCL RDNA (DIAGNOSTIC) 1 MG IJ SOLR
INTRAMUSCULAR | Status: DC | PRN
  Administered 2021-05-22: .5 mg via INTRAVENOUS

## 2021-05-22 MED ORDER — LACTATED RINGERS IV SOLN: INTRAVENOUS | Status: DC

## 2021-05-22 MED ORDER — PROPOFOL 500 MG/50ML IV EMUL
INTRAVENOUS | Status: AC
  Filled 2021-05-22: qty 50

## 2021-05-22 MED ORDER — PROPOFOL 10 MG/ML IV BOLUS
INTRAVENOUS | Status: DC | PRN
  Administered 2021-05-22 (×3): 20 mg via INTRAVENOUS

## 2021-05-22 SURGICAL SUPPLY — 25 items

## 2021-05-22 NOTE — Discharge Instructions (Signed)
YOU HAD AN ENDOSCOPIC PROCEDURE TODAY: Refer to the procedure report and other information in the discharge instructions given to you for any specific questions about what was found during the examination. If this information does not answer your questions, please call [place] office at [phone] to clarify.  ° °YOU SHOULD EXPECT: Some feelings of bloating in the abdomen. Passage of more gas than usual. Walking can help get rid of the air that was put into your GI tract during the procedure and reduce the bloating. If you had a lower endoscopy (such as a colonoscopy or flexible sigmoidoscopy) you may notice spotting of blood in your stool or on the toilet paper. Some abdominal soreness may be present for a day or two, also. ° °DIET: Your first meal following the procedure should be a light meal and then it is ok to progress to your normal diet. A half-sandwich or bowl of soup is an example of a good first meal. Heavy or fried foods are harder to digest and may make you feel nauseous or bloated. Drink plenty of fluids but you should avoid alcoholic beverages for 24 hours.  ° °ACTIVITY: Your care partner should take you home directly after the procedure. You should plan to take it easy, moving slowly for the rest of the day. You can resume normal activity the day after the procedure however YOU SHOULD NOT DRIVE, use power tools, machinery or perform tasks that involve climbing or major physical exertion for 24 hours (because of the sedation medicines used during the test).  ° °SYMPTOMS TO REPORT IMMEDIATELY: °A gastroenterologist can be reached at any hour. Please call [phone]  for any of the following symptoms:  °Following lower endoscopy (colonoscopy, flexible sigmoidoscopy) °Excessive amounts of blood in the stool  °Significant tenderness, worsening of abdominal pains  °Swelling of the abdomen that is new, acute  °Fever of 100° or higher  °Following upper endoscopy (EGD, EUS, ERCP, esophageal  dilation) °Vomiting of blood or coffee ground material  °New, significant abdominal pain  °New, significant chest pain or pain under the shoulder blades  °Painful or persistently difficult swallowing  °New shortness of breath  °Black, tarry-looking or red, bloody stools ° °FOLLOW UP:  °If any biopsies were taken you will be contacted by phone or by letter within the next 1-3 weeks. Call [phone]  if you have not heard about the biopsies in 3 weeks.  °Please also call with any specific questions about appointments or follow up tests.  °

## 2021-05-22 NOTE — Interval H&P Note (Signed)
History and Physical Interval Note: ? ?05/22/2021 ?10:27 AM ? ?Dustin Alvarez  has presented today for surgery, with the diagnosis of IDA.  The various methods of treatment have been discussed with the patient and family. After consideration of risks, benefits and other options for treatment, the patient has consented to  Procedure(s): ?COLONOSCOPY WITH PROPOFOL (N/A) ?ESOPHAGOGASTRODUODENOSCOPY (EGD) WITH PROPOFOL (N/A) as a surgical intervention.  The patient's history has been reviewed, patient examined, no change in status, stable for surgery.  I have reviewed the patient's chart and labs.  Questions were answered to the patient's satisfaction.   ? ?Stat CBC results returned, hemoglobin 9.8, platelet 443k ? ?Proceed with EGD and colonoscopy -discussed with anesthesia. ? ?Nelida Meuse III ? ? ?

## 2021-05-22 NOTE — H&P (Signed)
History and Physical: ? This patient presents for endoscopic testing for: ? ? ?Iron deficiency anemia and heme positive stool ?Clinical details are in [facility] office consult note of 05/04/2021. ?No clinical changes since then.  Patient had a hemoglobin of 7.9 at our office lab on 4/62023 (stable from a hemoglobin of 8.0 on 05/02/2021) ? stat CBC drawn today - awaiting results ?Patient has first IV iron infusion scheduled for tomorrow. ?ROS: ?Patient denies chest pain or shortness of breath ? ? ?Past Medical History: ?Past Medical History:  ?Diagnosis Date  ? Asthma   ? COPD (chronic obstructive pulmonary disease) ([place])   ? GERD (gastroesophageal reflux disease)   ? Hypertension   ? Prostate cancer ([place])   ? ? ? ?Past Surgical History: ?Past Surgical History:  ?Procedure Laterality Date  ? ABDOMINAL AORTOGRAM W/LOWER EXTREMITY N/A 10/26/2020  ? Procedure: ABDOMINAL AORTOGRAM W/LOWER EXTREMITY;  Surgeon: Marty Heck, MD;  Location: [facility];  Service: Cardiovascular;  Laterality: N/A;  ? WRIST SURGERY Left 2017  ? ? ?Allergies: ?Allergies  ?Allergen Reactions  ? Ace Inhibitors Swelling  ?  lisinopril  ? Lisinopril Swelling  ? ? ?Outpatient Meds: ?Current Facility-Administered Medications  ?Medication Dose Route Frequency Provider Last Rate Last Admin  ? 0.9 %  sodium chloride infusion   Intravenous Continuous Willia Craze, NP      ? ? ? ? ?___________________________________________________________________ ?Objective  ? ?Exam: ? ?Vitals as recorded in preprocedure nursing documentation. ? ?CV: RRR without murmur, S1/S2 ?Resp: clear to auscultation bilaterally, normal RR and effort noted ?GI: soft, no tenderness, with active bowel sounds. ? ? 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 ?Plan: ?Colonoscopy ?EGD ?(As long as hemoglobin value is acceptable) ? ?Procedure described with patient along with risks and benefits and he was  agreeable. ? The benefits and risks of the planned procedure were described in detail with the patient or (when appropriate) their health care proxy.  Risks were outlined as including, but not limited to, bleeding, infection, perforation, adverse medication reaction leading to cardiac or pulmonary decompensation, pancreatitis (if ERCP).  The limitation of incomplete mucosal visualization was also discussed.  No guarantees or warranties were given. ? ? ? ?The patient is appropriate for an endoscopic procedure in the ambulatory setting. ? ? - Wilfrid Lund, MD ? ? ? ? ?

## 2021-05-22 NOTE — Anesthesia Preprocedure Evaluation (Addendum)
Anesthesia Evaluation  ?Patient identified by MRN, date of birth, ID band ?Patient awake ? ? ? ?Reviewed: ?Allergy & Precautions, NPO status , Patient's Chart, lab work & pertinent test results ? ?History of Anesthesia Complications ?(+) history of anesthetic complications ? ?Airway ?Mallampati: I ? ?TM Distance: >3 FB ?Neck ROM: Full ? ? ? Dental ? ?(+) Edentulous Upper, Edentulous Lower, Dental Advisory Given ?  ?Pulmonary ?COPD, Current Smoker,  ?  ?Pulmonary exam normal ? ? ? ? ? ? ? Cardiovascular ?hypertension, + Peripheral Vascular Disease  ?Normal cardiovascular exam ? ? ?  ?Neuro/Psych ?negative neurological ROS ?   ? GI/Hepatic ?Neg liver ROS, GERD  ,  ?Endo/Other  ?negative endocrine ROS ? Renal/GU ?negative Renal ROSProstate Ca bone mets  ? ?  ?Musculoskeletal ?negative musculoskeletal ROS ?(+)  ? Abdominal ?  ?Peds ? Hematology ? ?(+) Blood dyscrasia, anemia ,   ?Anesthesia Other Findings ? ? Reproductive/Obstetrics ? ?  ? ? ? ? ? ? ? ? ? ? ? ? ? ?  ?  ? ? ? ? ? ? ? ?Anesthesia Physical ?Anesthesia Plan ? ?ASA: 3 ? ?Anesthesia Plan: MAC  ? ?Post-op Pain Management: Minimal or no pain anticipated  ? ?Induction:  ? ?PONV Risk Score and Plan: Propofol infusion ? ?Airway Management Planned: Natural Airway ? ?Additional Equipment:  ? ?Intra-op Plan:  ? ?Post-operative Plan:  ? ?Informed Consent: I have reviewed the patients History and Physical, chart, labs and discussed the procedure including the risks, benefits and alternatives for the proposed anesthesia with the patient or authorized representative who has indicated his/her understanding and acceptance.  ? ? ? ?Dental advisory given ? ?Plan Discussed with: Anesthesiologist ? ?Anesthesia Plan Comments:   ? ? ? ? ? ?Anesthesia Quick Evaluation ? ?

## 2021-05-22 NOTE — Progress Notes (Signed)
Internal Medicine Clinic Attending  Case discussed with Dr. Christian  At the time of the visit.  We reviewed the resident's history and exam and pertinent patient test results.  I agree with the assessment, diagnosis, and plan of care documented in the resident's note.  

## 2021-05-22 NOTE — Transfer of Care (Signed)
Immediate Anesthesia Transfer of Care Note ? ?Patient: Dustin Alvarez ? ?Procedure(s) Performed: COLONOSCOPY WITH PROPOFOL ?ESOPHAGOGASTRODUODENOSCOPY (EGD) WITH PROPOFOL ?HOT HEMOSTASIS (ARGON PLASMA COAGULATION/BICAP) ?HEMOSTASIS CLIP PLACEMENT ? ?Patient Location: Endoscopy Unit ? ?Anesthesia Type:MAC ? ?Level of Consciousness: awake ? ?Airway & Oxygen Therapy: Patient Spontanous Breathing and Patient connected to face mask oxygen ? ?Post-op Assessment: Report given to RN and Post -op Vital signs reviewed and stable ? ?Post vital signs: Reviewed and stable ? ?Last Vitals:  ?Vitals Value Taken Time  ?BP 115/67 05/22/21 1131  ?Temp    ?Pulse 77 05/22/21 1132  ?Resp 16 05/22/21 1132  ?SpO2 100 % 05/22/21 1132  ?Vitals shown include unvalidated device data. ? ?Last Pain:  ?Vitals:  ? 05/22/21 1013  ?TempSrc: Oral  ?   ? ?  ? ?Complications: No notable events documented. ?

## 2021-05-22 NOTE — Anesthesia Postprocedure Evaluation (Signed)
Anesthesia Post Note ? ?Patient: Dustin Alvarez ? ?Procedure(s) Performed: COLONOSCOPY WITH PROPOFOL ?ESOPHAGOGASTRODUODENOSCOPY (EGD) WITH PROPOFOL ?HOT HEMOSTASIS (ARGON PLASMA COAGULATION/BICAP) ?HEMOSTASIS CLIP PLACEMENT ? ?  ? ?Patient location during evaluation: Endoscopy ?Anesthesia Type: MAC ?Level of consciousness: awake and alert ?Pain management: pain level controlled ?Vital Signs Assessment: post-procedure vital signs reviewed and stable ?Respiratory status: spontaneous breathing and respiratory function stable ?Cardiovascular status: stable ?Postop Assessment: no apparent nausea or vomiting ?Anesthetic complications: no ? ? ?No notable events documented. ? ?Last Vitals:  ?Vitals:  ? 05/22/21 1140 05/22/21 1150  ?BP: (!) 151/86 135/77  ?Pulse: 86 85  ?Resp: 17 18  ?Temp:    ?SpO2: 92% 94%  ?  ?Last Pain:  ?Vitals:  ? 05/22/21 1150  ?TempSrc:   ?PainSc: 0-No pain  ? ? ?  ?  ?  ?  ?  ?  ? ?[name] DANIEL ? ? ? ? ?

## 2021-05-22 NOTE — Op Note (Signed)
[facility] ?Patient Name: Dustin Alvarez ?Procedure Date: 05/22/2021 ?MRN: [id number] ?Attending MD: Estill Cotta. Loletha Carrow , MD ?Date of Birth: 04-16-1955 ?CSN: [id number] ?Age: 66 ?Admit Type: Outpatient ?Procedure:                Upper GI endoscopy ?Indications:              Iron deficiency anemia secondary to chronic blood  ?                          loss, Heme positive stool ?Providers:                Estill Cotta. Loletha Carrow, MD, Doristine Johns, RN, Alphonzo Grieve  ?                          Leighton Roach, Technician, Gloris Ham, Technician ?Referring MD:             Narda Rutherford, MD (Hematology/Oncology) ?Medicines:                Monitored Anesthesia Care, Glucagon 0.5 mg IV ?Complications:            No immediate complications. ?Estimated Blood Loss:     Estimated blood loss was minimal. ?Procedure:                Pre-Anesthesia Assessment: ?                          - Prior to the procedure, a History and Physical  ?                          was performed, and patient medications and  ?                          allergies were reviewed. The patient's tolerance of  ?                          previous anesthesia was also reviewed. The risks  ?                          and benefits of the procedure and the sedation  ?                          options and risks were discussed with the patient.  ?                          All questions were answered, and informed consent  ?                          was obtained. Prior Anticoagulants: The patient has  ?                          taken no previous anticoagulant or antiplatelet  ?                          agents. ASA Grade Assessment: III - A patient with  ?  severe systemic disease. After reviewing the risks  ?                          and benefits, the patient was deemed in  ?                          satisfactory condition to undergo the procedure. ?                          After obtaining informed consent, the endoscope was  ?                           passed under direct vision. Throughout the  ?                          procedure, the patient's blood pressure, pulse, and  ?                          oxygen saturations were monitored continuously. The  ?                          [id number]-[id number] ([id number]) Olympus endoscope was introduced  ?                          through the mouth, and advanced to the second part  ?                          of duodenum. The upper GI endoscopy was somewhat  ?                          difficult due to respiratory motion and a J-shaped  ?                          stomach, making scope positioning and endoscopic  ?                          intervention challenging. The patient tolerated the  ?                          procedure fairly well. ?Scope In: ?Scope Out: ?Findings: ?     The esophagus was normal. ?     Three small angioectasias with no bleeding were found on the greater  ?     curvature of the stomach and on the lesser curvature of the stomach.  ?     Coagulation for hemostasis using argon plasma at 0.5 liters/minute and  ?     30 watts was successful. To prevent bleeding post-intervention, one  ?     hemostatic clip was successfully placed (MR conditional). There was no  ?     bleeding at the end of the procedure. ?     Four small angioectasias without bleeding were found in the duodenal  ?     bulb and in the second portion of the duodenum. Coagulation for  ?     hemostasis using argon plasma at 0.5 liters/minute and 20 watts was  ?  successful. ?Impression:               - Normal esophagus. ?                          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  ?                          Treated with argon plasma coagulation (APC). Clip  ?                          (MR conditional) was placed. ?                          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  ?                          Treated with argon plasma coagulation (APC). ?                          - No specimens collected. ?Moderate  Sedation: ?     MAC sedation used ?Recommendation:           - Patient has a contact number available for  ?                          emergencies. The signs and symptoms of potential  ?                          delayed complications were discussed with the  ?                          patient. Return to normal activities tomorrow.  ?                          Written discharge instructions were provided to the  ?                          patient. ?                          - Resume previous diet. ?                          - Continue present medications. ?                          - See the other procedure note for documentation of  ?                          additional recommendations. ?                          - Have iron infusions as scheduled and follow  ?                          closely with Hematology. ?  If patient's hemoglobin and iron levels do not rise  ?                          as expected with IV iron, or if they respond but  ?                          later decrease to indicate ongoing bleeding, refer  ?                          back to GI for consideration of VCE. ?Procedure Code(s):        --- Professional --- ?                          38381, Esophagogastroduodenoscopy, flexible,  ?                          transoral; with control of bleeding, any method ?Diagnosis Code(s):        --- Professional --- ?                          M40.375, Angiodysplasia of stomach and duodenum  ?                          without bleeding ?                          D50.0, Iron deficiency anemia secondary to blood  ?                          loss (chronic) ?                          R19.5, Other fecal abnormalities ?CPT copyright 2019 [employer] Association. All rights reserved. ?The codes documented in this report are preliminary and upon coder review may  ?be revised to meet current compliance requirements. ?Miranda Garber L. Loletha Carrow, MD ?05/22/2021 11:37:19 AM ?This report has been signed  electronically. ?Number of Addenda: 0 ?

## 2021-05-22 NOTE — Op Note (Signed)
[facility] ?Patient Name: Dustin Alvarez ?Procedure Date: 05/22/2021 ?MRN: [id number] ?Attending MD: Estill Cotta. Loletha Carrow , MD ?Date of Birth: 30-Sep-1955 ?CSN: [id number] ?Age: 66 ?Admit Type: Outpatient ?Procedure:                Colonoscopy ?Indications:              Heme positive stool, Iron deficiency anemia  ?                          secondary to chronic blood loss ?                          Patient also has metastatic prostate cancer and COPD ?Providers:                Estill Cotta. Loletha Carrow, MD, Doristine Johns, RN, Alphonzo Grieve  ?                          Leighton Roach, Technician, Gloris Ham, Technician ?Referring MD:              ?Medicines:                Monitored Anesthesia Care ?Complications:            No immediate complications. ?Estimated Blood Loss:     Estimated blood loss: none. ?Procedure:                Pre-Anesthesia Assessment: ?                          - Prior to the procedure, a History and Physical  ?                          was performed, and patient medications and  ?                          allergies were reviewed. The patient's tolerance of  ?                          previous anesthesia was also reviewed. The risks  ?                          and benefits of the procedure and the sedation  ?                          options and risks were discussed with the patient.  ?                          All questions were answered, and informed consent  ?                          was obtained. Prior Anticoagulants: The patient has  ?                          taken no previous anticoagulant or antiplatelet  ?  agents. ASA Grade Assessment: III - A patient with  ?                          severe systemic disease. After reviewing the risks  ?                          and benefits, the patient was deemed in  ?                          satisfactory condition to undergo the procedure. ?                          After obtaining informed consent, the colonoscope  ?                           was passed under direct vision. Throughout the  ?                          procedure, the patient's blood pressure, pulse, and  ?                          oxygen saturations were monitored continuously. The  ?                          [id number]-[id number] ([id number]) Olympus colonoscope was  ?                          introduced through the anus and advanced to the the  ?                          terminal ileum, with identification of the  ?                          appendiceal orifice and IC valve. The colonoscopy  ?                          was performed without difficulty. The patient  ?                          tolerated the procedure well. The quality of the  ?                          bowel preparation was excellent. The terminal  ?                          ileum, ileocecal valve, appendiceal orifice, and  ?                          rectum were photographed. ?Scope In: 10:32:13 AM ?Scope Out: 10:51:21 AM ?Scope Withdrawal Time: 0 hours 10 minutes 46 seconds  ?Total Procedure Duration: 0 hours 19 minutes 8 seconds  ?Findings: ?     The digital rectal exam findings include decreased sphincter tone. ?     The terminal ileum appeared normal. ?  A few small angioectasias without bleeding were found in the proximal  ?     rectum. It is a 2 cm length of mild, patchy, non-friable and  ?     non-bleeding radiation proctopathy. ?     Internal hemorrhoids were found. ?     The exam was otherwise without abnormality.(no retroflexion in rectum -  ?     narrow anatomy) ?Impression:               - Decreased sphincter tone found on digital rectal  ?                          exam. ?                          - The examined portion of the ileum was normal. ?                          - A few non-bleeding colonic angioectasias. ?                          - Internal hemorrhoids. ?                          - The examination was otherwise normal. ?                          - No specimens collected. ?Moderate Sedation: ?      MAC sedation used ?Recommendation:           - Patient has a contact number available for  ?                          emergencies. The signs and symptoms of potential  ?                          delayed complications were discussed with the  ?                          patient. Return to normal activities tomorrow.  ?                          Written discharge instructions were provided to the  ?                          patient. ?                          - Resume previous diet. ?                          - Continue present medications. ?                          - Repeat colonoscopy (or cologuard, depending on  ?                          patient's medical condition)in 10 years for  ?  screening purposes. ?Procedure Code(s):        --- Professional --- ?                          [phone], Colonoscopy, flexible; diagnostic, including  ?                          collection of specimen(s) by brushing or washing,  ?                          when performed (separate procedure) ?Diagnosis Code(s):        --- Professional --- ?                          K62.89, Other specified diseases of anus and rectum ?                          K64.8, Other hemorrhoids ?                          K55.20, Angiodysplasia of colon without hemorrhage ?                          R19.5, Other fecal abnormalities ?                          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 All rights reserved. ?The codes documented in this report are preliminary and upon coder review may  ?be revised to meet current compliance requirements. ?Reveca Desmarais L. Loletha Carrow, MD ?05/22/2021 10:59:35 AM ?This report has been signed electronically. ?Number of Addenda: 0 ?

## 2021-05-23 ENCOUNTER — Encounter (HOSPITAL_COMMUNITY): Payer: Self-pay | Admitting: Gastroenterology

## 2021-05-23 ENCOUNTER — Ambulatory Visit (INDEPENDENT_AMBULATORY_CARE_PROVIDER_SITE_OTHER): Payer: Medicare Other | Admitting: *Deleted

## 2021-05-23 VITALS — BP 122/70 | HR 92 | Temp 96.6°F | Resp 18 | Ht 71.0 in | Wt 137.2 lb

## 2021-05-23 DIAGNOSIS — D500 Iron deficiency anemia secondary to blood loss (chronic): Secondary | ICD-10-CM

## 2021-05-23 MED ORDER — SODIUM CHLORIDE 0.9 % IV SOLN
200.0000 mg | Freq: Once | INTRAVENOUS | Status: AC
  Administered 2021-05-23: 200 mg via INTRAVENOUS
  Filled 2021-05-23: qty 10

## 2021-05-23 NOTE — Progress Notes (Addendum)
Diagnosis: Iron Deficiency Anemia ? ?Provider:  Marshell Garfinkel, MD ? ?Procedure: Infusion ? ?IV Type: Peripheral, IV Location: L Forearm ? ?Venofer (Iron Sucrose), , Dose: 200 mg ? ?Infusion Start Time: 1004 am ? ?Infusion Stop Time: 1025 am ? ?Post Infusion IV Care: Observation period completed and Peripheral IV Discontinued ? ?Discharge: Condition: Good, Destination: Home . AVS provided to patient.  ? ?Performed by:  Koren Shiver, RN  ?  ?

## 2021-05-25 ENCOUNTER — Other Ambulatory Visit (HOSPITAL_COMMUNITY): Payer: Self-pay

## 2021-05-26 ENCOUNTER — Other Ambulatory Visit (HOSPITAL_COMMUNITY): Payer: Self-pay

## 2021-05-26 ENCOUNTER — Other Ambulatory Visit: Payer: Self-pay | Admitting: Physician Assistant

## 2021-05-28 ENCOUNTER — Encounter: Payer: Self-pay | Admitting: Hematology and Oncology

## 2021-05-28 ENCOUNTER — Telehealth: Payer: Self-pay | Admitting: *Deleted

## 2021-05-28 ENCOUNTER — Other Ambulatory Visit (HOSPITAL_COMMUNITY): Payer: Self-pay

## 2021-05-28 ENCOUNTER — Other Ambulatory Visit: Payer: Self-pay | Admitting: Hematology and Oncology

## 2021-05-28 MED ORDER — XTAMPZA ER 36 MG PO C12A
36.0000 mg | EXTENDED_RELEASE_CAPSULE | Freq: Two times a day (BID) | ORAL | 0 refills | Status: DC | PRN
  Filled 2021-05-28: qty 54, 27d supply, fill #0
  Filled 2021-05-28: qty 6, 3d supply, fill #0

## 2021-05-28 NOTE — Telephone Encounter (Signed)
Received vm message from pt's daughter, [name]. She states pt needs refill of his Xtampza 36 mg to [facility]. ?Pt is completely out. ? ?Dr. Lorenso Courier made aware. ?

## 2021-05-30 ENCOUNTER — Encounter: Payer: Self-pay | Admitting: Nurse Practitioner

## 2021-05-30 ENCOUNTER — Other Ambulatory Visit: Payer: Self-pay

## 2021-05-30 ENCOUNTER — Ambulatory Visit (INDEPENDENT_AMBULATORY_CARE_PROVIDER_SITE_OTHER): Payer: Medicare Other | Admitting: *Deleted

## 2021-05-30 ENCOUNTER — Encounter: Payer: Self-pay | Admitting: Hematology and Oncology

## 2021-05-30 ENCOUNTER — Other Ambulatory Visit (HOSPITAL_COMMUNITY): Payer: Self-pay

## 2021-05-30 ENCOUNTER — Inpatient Hospital Stay (HOSPITAL_BASED_OUTPATIENT_CLINIC_OR_DEPARTMENT_OTHER): Payer: Medicare Other | Admitting: Nurse Practitioner

## 2021-05-30 VITALS — BP 114/70 | HR 81 | Temp 98.6°F | Resp 18

## 2021-05-30 VITALS — BP 117/69 | HR 82 | Temp 97.8°F | Resp 18 | Ht 71.0 in | Wt 143.6 lb

## 2021-05-30 DIAGNOSIS — C61 Malignant neoplasm of prostate: Secondary | ICD-10-CM

## 2021-05-30 DIAGNOSIS — D500 Iron deficiency anemia secondary to blood loss (chronic): Secondary | ICD-10-CM | POA: Diagnosis not present

## 2021-05-30 DIAGNOSIS — G893 Neoplasm related pain (acute) (chronic): Secondary | ICD-10-CM | POA: Diagnosis not present

## 2021-05-30 DIAGNOSIS — C7951 Secondary malignant neoplasm of bone: Secondary | ICD-10-CM

## 2021-05-30 DIAGNOSIS — K5903 Drug induced constipation: Secondary | ICD-10-CM

## 2021-05-30 DIAGNOSIS — Z515 Encounter for palliative care: Secondary | ICD-10-CM

## 2021-05-30 MED ORDER — SORBITOL 70 % SOLN
15.0000 mL | Freq: Every day | 0 refills | Status: DC | PRN
  Filled 2021-05-30: qty 473, 31d supply, fill #0

## 2021-05-30 MED ORDER — SODIUM CHLORIDE 0.9 % IV SOLN
200.0000 mg | Freq: Once | INTRAVENOUS | Status: AC
  Administered 2021-05-30: 200 mg via INTRAVENOUS
  Filled 2021-05-30: qty 10

## 2021-05-30 NOTE — Progress Notes (Signed)
Diagnosis: Iron Deficiency Anemia ? ?Provider:  Marshell Garfinkel, MD ? ?Procedure: Infusion ? ?IV Type: Peripheral, IV Location: L Hand ? ?Venofer (Iron Sucrose), Dose: 200 mg ? ?Infusion Start Time: 1001 am ? ?Infusion Stop Time: 1025 am ? ?Post Infusion IV Care: Observation period completed and Peripheral IV Discontinued ? ?Discharge: Condition: Good, Destination: Home . AVS provided to patient.  ? ?Performed by:  Oren Beckmann, RN  ?  ?

## 2021-05-30 NOTE — Progress Notes (Signed)
? ?  ?Palliative Medicine ?[facility]  ?[phone] [phone] ? ? ?Name: Dustin Alvarez ?Date: 05/30/2021 ?MRN: [id number]  ?DOB: [birth year] ? ?Patient Care Team: ?[name], MD as PCP - General (Internal Medicine) ?Cira Rue, RN Nurse Navigator as Registered Nurse (Medical Oncology)  ? ? ?REASON FOR CONSULTATION: ?Dustin Alvarez is a 66 y.o. 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  Palliative ask to see for symptom management and goals of care.  ? ? ?SOCIAL HISTORY:    ? reports that he has been smoking cigarettes. He has been smoking an average of 1 pack per day. He has never used smokeless tobacco. He reports current alcohol use. He reports that he does not currently use drugs. ? ?ADVANCE DIRECTIVES:  ?Patient reports his children would be his medical decision makers. Does have MOST on file. Personally reviewed an discussed with no requested changes.   ? ?CODE STATUS: DNR ? ?PAST MEDICAL HISTORY: ?Past Medical History:  ?Diagnosis Date  ? Asthma   ? COPD (chronic obstructive pulmonary disease) ([place])   ? GERD (gastroesophageal reflux disease)   ? Hypertension   ? Prostate cancer ([place])   ? ? ?PAST SURGICAL HISTORY:  ?Past Surgical History:  ?Procedure Laterality Date  ? ABDOMINAL AORTOGRAM W/LOWER EXTREMITY N/A 10/26/2020  ? Procedure: ABDOMINAL AORTOGRAM W/LOWER EXTREMITY;  Surgeon: Marty Heck, MD;  Location: [facility];  Service: Cardiovascular;  Laterality: N/A;  ? COLONOSCOPY WITH PROPOFOL N/A 05/22/2021  ? Procedure: COLONOSCOPY WITH PROPOFOL;  Surgeon: Doran Stabler, MD;  Location: [facility];  Service: Gastroenterology;  Laterality: N/A;  ? ESOPHAGOGASTRODUODENOSCOPY (EGD) WITH PROPOFOL N/A 05/22/2021  ? Procedure: ESOPHAGOGASTRODUODENOSCOPY (EGD) WITH PROPOFOL;   Surgeon: Doran Stabler, MD;  Location: [facility];  Service: Gastroenterology;  Laterality: N/A;  ? HEMOSTASIS CLIP PLACEMENT  05/22/2021  ? Procedure: HEMOSTASIS CLIP PLACEMENT;  Surgeon: Doran Stabler, MD;  Location: [facility];  Service: Gastroenterology;;  ? HOT HEMOSTASIS N/A 05/22/2021  ? Procedure: HOT HEMOSTASIS (ARGON PLASMA COAGULATION/BICAP);  Surgeon: Doran Stabler, MD;  Location: [facility];  Service: Gastroenterology;  Laterality: N/A;  ? WRIST SURGERY Left 2017  ? ? ?HEMATOLOGY/ONCOLOGY HISTORY:  ?Oncology History  ? No history exists.  ? ? ?ALLERGIES:  is allergic to ace inhibitors and lisinopril. ? ?MEDICATIONS:  ?Current Outpatient Medications  ?Medication Sig Dispense Refill  ? abiraterone acetate (ZYTIGA) 250 MG tablet TAKE 4 TABLETS BY MOUTH DAILY. TAKE ON AN EMPTY STOMACH 1 HOUR BEFORE OR 2 HOURS AFTER A MEAL 120 tablet 2  ? albuterol (VENTOLIN HFA) 108 (90 Base) MCG/ACT inhaler Inhale 2 puffs into the lungs every 6 (six) hours as needed for wheezing or shortness of breath. 6.7 g 3  ? amLODipine (NORVASC) 5 MG tablet Take 1 tablet (5 mg total) by mouth daily. 90 tablet 1  ? calcium-vitamin D (OSCAL WITH D) 500-200 MG-UNIT tablet Take 1 tablet by mouth daily with breakfast. 90 tablet 3  ? docusate sodium (COLACE) 100 MG capsule Take 200 mg by mouth daily.    ? finasteride (PROSCAR) 5 MG tablet Take 1 tablet (5 mg total) by mouth daily. 90 tablet 1  ? Fluticasone-Umeclidin-Vilant (TRELEGY ELLIPTA) 200-62.5-25 MCG/ACT AEPB Use once daily (Patient taking differently: Inhale 1 puff into the lungs daily. Use once daily) 1 each 3  ? gabapentin (  NEURONTIN) 300 MG capsule Take 2 capsules (600 mg total) by mouth 2 (two) times daily. 120 capsule 2  ? lidocaine (LIDODERM) 5 % Place 1 patch onto the skin daily. Remove & Discard patch within 12 hours or as directed by MD (Patient not taking: Reported on 05/18/2021) 30 patch 0  ? nicotine (NICOTROL) 10 MG inhaler Inhale 1 continuous puffing  into the lungs as needed for smoking cessation.    ? oxyCODONE (OXY IR/ROXICODONE) 5 MG immediate release tablet TAKE 1 TABLET BY MOUTH EVERY 4 HOURS AS NEEDED 180 tablet 0  ? oxyCODONE ER (XTAMPZA ER) 36 MG C12A Take 1 capsule by mouth every 12 hours as needed. 60 capsule 0  ? pantoprazole (PROTONIX) 40 MG tablet Take 1 tablet (40 mg total) by mouth daily. 90 tablet 3  ? polyethylene glycol (MIRALAX) 17 g packet Mix 17 grams (1 packet) with 4-8 ounces of liquid and take by mouth 2 (two) times daily. (Patient not taking: Reported on 05/18/2021) 60 each 2  ? predniSONE (DELTASONE) 5 MG tablet Take 1 tablet (5 mg total) by mouth daily with breakfast. (do not take prior to starting Abiraterone) 90 tablet 1  ? sildenafil (VIAGRA) 100 MG tablet Take 1 tablet (100 mg total) by mouth daily as needed for erectile dysfunction. 30 tablet 0  ? tamsulosin (FLOMAX) 0.4 MG CAPS capsule Take 1 capsule (0.4 mg total) by mouth in the morning and at bedtime. 180 capsule 0  ? traZODone (DESYREL) 100 MG tablet Take 0.5 tablets (50 mg total) by mouth at bedtime. 90 tablet 1  ? ?No current facility-administered medications for this visit.  ? ? ?VITAL SIGNS: ?BP 114/70 (BP Location: Left Arm, Patient Position: Sitting)   Pulse 81   Temp 98.6 ?F (37 ?C) (Oral)   Resp 18   SpO2 90%  ?There were no vitals filed for this visit. ?  ?Estimated body mass index is 20.03 kg/m? as calculated from the following: ?  Height as of an earlier encounter on 05/30/21: '5\' 11"'$  (1.803 m). ?  Weight as of an earlier encounter on 05/30/21: 143 lb 9.6 oz (65.1 kg). ? ? ?PERFORMANCE STATUS (ECOG) : 1 - Symptomatic but completely ambulatory ? ? ?Physical Exam ?General: NAD, well developed, in a wheelchair ?Cardiovascular: RRR ?Pulmonary: wheeze, normal breathing pattern  ?Abdomen: soft, nontender, + bowel sounds ?Extremities: no edema, no joint deformities ?Skin: no rashes ?Neurological: AAOx3, mood appropriate ? ?IMPRESSION: ?Dustin Alvarez and his daughter presents  to the clinic for symptom management follow-up. No acute distress noted. He presented in a wheelchair however gets up and sits in chair in room once here.  ? ?1.  Neoplasm related pain ?Mr. Digilio reports pain is ongoing but somewhat improved. He did not pick up his lidocaine patches until 2 days ago. States he notice some improvement with use.  ? ?We reviewed at length his current pain regimen which consists of Xtampza 36 mg twice daily, Oxy IR 5-10 mg every 4 hours as needed for breakthrough pain, and gabapentin 600 mg twice daily. He confirms taking the gabapentin '600mg'$  over the past several weeks as he was not doing this prior to last visit. Reports ability to be more active over the past week which both he and his family are appreciative of.  ? ?Does endorse some relief on Xtampza however does not feel that it is as effective as it was when he was taking OxyContin.  Understands due to insurance restrictions Ginger Organ is preferred medication.  We will plan to continue to monitor and make adjustments as needed.  Could consider Xtampza every 8 hours versus 12 if pain continues to be of concern and uncontrolled.  He is taking 10 mg Oxy IR every 4 hours as needed and feels some relief when taking for breakthrough.  We will continue on this regimen and again emphasized close monitoring. ? ?Education provided on use of lidocaine patch for lower back pain. ? ?2.  Constipation ?[place] does endorse ongoing constipation.  Education provided on increased risk due to opioid use and importance of strict bowel regimen to prevent constipation which could also contribute to escalation in his pain.  States he has been taking MiraLAX twice daily in addition to senna for bowel regimen with minimal relief. Will generally have a bowel movement every 2-3 days. Education provided on continued use in addition to having Sorbitol on hand for additional support. Patient and daughter verbalized understanding with plans to adhere. ? ?3.   Goals of care ? ?Mr. Goin and his family verbalized understanding of his current disease trajectory.  They are remaining hopeful for stability and taking things day by day.  Mr. Sankey expressed he is not wor

## 2021-05-30 NOTE — Addendum Note (Signed)
Addended by: Jimmy Footman on: 05/30/2021 01:49 PM ? ? Modules accepted: Orders ? ?

## 2021-06-01 ENCOUNTER — Other Ambulatory Visit (HOSPITAL_COMMUNITY): Payer: Self-pay

## 2021-06-01 ENCOUNTER — Other Ambulatory Visit: Payer: Self-pay | Admitting: Nurse Practitioner

## 2021-06-01 MED ORDER — OXYCODONE HCL 5 MG PO TABS
ORAL_TABLET | ORAL | 0 refills | Status: DC | PRN
  Filled 2021-06-02: qty 180, 30d supply, fill #0

## 2021-06-02 ENCOUNTER — Other Ambulatory Visit (HOSPITAL_COMMUNITY): Payer: Self-pay

## 2021-06-06 ENCOUNTER — Ambulatory Visit (INDEPENDENT_AMBULATORY_CARE_PROVIDER_SITE_OTHER): Payer: Medicare Other | Admitting: *Deleted

## 2021-06-06 VITALS — BP 103/78 | HR 90 | Temp 97.6°F | Resp 16 | Ht 71.0 in | Wt 144.0 lb

## 2021-06-06 DIAGNOSIS — D500 Iron deficiency anemia secondary to blood loss (chronic): Secondary | ICD-10-CM

## 2021-06-06 MED ORDER — SODIUM CHLORIDE 0.9 % IV SOLN
200.0000 mg | Freq: Once | INTRAVENOUS | Status: AC
  Administered 2021-06-06: 200 mg via INTRAVENOUS
  Filled 2021-06-06: qty 10

## 2021-06-06 NOTE — Progress Notes (Signed)
Diagnosis: Iron Deficiency Anemia ? ?Provider:  Marshell Garfinkel, MD ? ?Procedure: Infusion ? ?IV Type: Peripheral, IV Location: L Forearm ? ?Venofer (Iron Sucrose),Dose: 200 mg ? ?Infusion Start Time: 1011 am ? ?Infusion Stop Time: 1032 am ? ?Post Infusion IV Care: Observation period completed and Peripheral IV Discontinued ? ?Discharge: Condition: Good, Destination: Home . AVS provided to patient.  ? ?Performed by:  Oren Beckmann, RN  ?  ?

## 2021-06-12 ENCOUNTER — Ambulatory Visit (INDEPENDENT_AMBULATORY_CARE_PROVIDER_SITE_OTHER): Payer: Medicare Other | Admitting: Pulmonary Disease

## 2021-06-12 ENCOUNTER — Encounter: Payer: Self-pay | Admitting: Pulmonary Disease

## 2021-06-12 VITALS — BP 100/60 | HR 85 | Ht 71.0 in | Wt 144.6 lb

## 2021-06-12 DIAGNOSIS — R2241 Localized swelling, mass and lump, right lower limb: Secondary | ICD-10-CM

## 2021-06-12 DIAGNOSIS — J432 Centrilobular emphysema: Secondary | ICD-10-CM

## 2021-06-12 MED ORDER — TRELEGY ELLIPTA 200-62.5-25 MCG/ACT IN AEPB: 1.0000 | INHALATION_SPRAY | Freq: Every day | RESPIRATORY_TRACT | 11 refills | Status: AC

## 2021-06-12 MED ORDER — ALBUTEROL SULFATE (2.5 MG/3ML) 0.083% IN NEBU: 2.5000 mg | INHALATION_SOLUTION | Freq: Four times a day (QID) | RESPIRATORY_TRACT | 5 refills | Status: DC | PRN

## 2021-06-12 MED ORDER — ALBUTEROL SULFATE HFA 108 (90 BASE) MCG/ACT IN AERS: 2.0000 | INHALATION_SPRAY | Freq: Four times a day (QID) | RESPIRATORY_TRACT | 5 refills | Status: DC | PRN

## 2021-06-12 NOTE — Progress Notes (Signed)
? ? ?Subjective:  ? ?PATIENT ID: Dustin Alvarez GENDER: male DOB: 07-13-1955, MRN: [id number] ? ? ?HPI ? ?Chief Complaint  ?Patient presents with  ? Consult  ?  copd  ? ? ?Reason for Visit: New consult for COPD ? ?Dustin Alvarez is 66 year old male active smoker with metastatic prostate cancer with bone involvement s/p Lupron injection and Zometa 01/17/21, COPD, HTN, GERD, IDA who presents for management of COPD. Daughter present and provides history with patient. ? ?He was diagnosed with COPD in 2013. He is on Trelegy and Albuterol. He uses albuterol twice a day. He is currently smoking 1/2 ppd. He reports shortness of breath, coughing or wheezing, congested cough daily. Uses a cane however does not ambulate very often due to leg pain. Has had worsening right leg pain and swelling. Previously had ultrasound which was negative but now worsening skin tightness. He has 6 exacerbations a year. No hospitalizations. ? ?Social History: ?Active smoker. 53 pack years. ? ?I have personally reviewed patient's past medical/family/social history, allergies, current medications. ? ?Past Medical History:  ?Diagnosis Date  ? Asthma   ? COPD (chronic obstructive pulmonary disease) ([place])   ? GERD (gastroesophageal reflux disease)   ? Hypertension   ? Prostate cancer ([place])   ?  ? ?Family History  ?Problem Relation Age of Onset  ? COPD Father   ? Diabetes Sister   ? Diabetes Brother   ? Breast cancer Neg Hx   ? Prostate cancer Neg Hx   ? Colon cancer Neg Hx   ? Pancreatic cancer Neg Hx   ? Stomach cancer Neg Hx   ? Esophageal cancer Neg Hx   ?  ? ?Social History  ? ?Occupational History  ? Not on file  ?Tobacco Use  ? Smoking status: Some Days  ?  Packs/day: 1.00  ?  Years: 53.00  ?  Pack years: 53.00  ?  Types: Cigarettes  ? Smokeless tobacco: Never  ? Tobacco comments:  ?  0.5 PPD/Wants patches   ?Vaping Use  ? Vaping Use: Never used  ?Substance and Sexual Activity  ? Alcohol use: Yes  ?  Comment: 1-2 drinks per week.  ? Drug  use: Not Currently  ? Sexual activity: Not Currently  ? ? ?Allergies  ?Allergen Reactions  ? Ace Inhibitors Swelling  ?  lisinopril  ? Lisinopril Swelling  ?  ? ?Outpatient Medications Prior to Visit  ?Medication Sig Dispense Refill  ? abiraterone acetate (ZYTIGA) 250 MG tablet TAKE 4 TABLETS BY MOUTH DAILY. TAKE ON AN EMPTY STOMACH 1 HOUR BEFORE OR 2 HOURS AFTER A MEAL 120 tablet 2  ? amLODipine (NORVASC) 5 MG tablet Take 1 tablet (5 mg total) by mouth daily. 90 tablet 1  ? calcium-vitamin D (OSCAL WITH D) 500-200 MG-UNIT tablet Take 1 tablet by mouth daily with breakfast. 90 tablet 3  ? docusate sodium (COLACE) 100 MG capsule Take 200 mg by mouth daily.    ? finasteride (PROSCAR) 5 MG tablet Take 1 tablet (5 mg total) by mouth daily. 90 tablet 1  ? gabapentin (NEURONTIN) 300 MG capsule Take 2 capsules (600 mg total) by mouth 2 (two) times daily. 120 capsule 2  ? lidocaine (LIDODERM) 5 % Place 1 patch onto the skin daily. Remove & Discard patch within 12 hours or as directed by MD 30 patch 0  ? nicotine (NICOTROL) 10 MG inhaler Inhale 1 continuous puffing into the lungs as needed for smoking cessation.    ?  oxyCODONE (OXY IR/ROXICODONE) 5 MG immediate release tablet TAKE 1 TABLET BY MOUTH EVERY 4 HOURS AS NEEDED 180 tablet 0  ? oxyCODONE ER (XTAMPZA ER) 36 MG C12A Take 1 capsule by mouth every 12 hours as needed. 60 capsule 0  ? pantoprazole (PROTONIX) 40 MG tablet Take 1 tablet (40 mg total) by mouth daily. 90 tablet 3  ? polyethylene glycol (MIRALAX) 17 g packet Mix 17 grams (1 packet) with 4-8 ounces of liquid and take by mouth 2 (two) times daily. 60 each 2  ? predniSONE (DELTASONE) 5 MG tablet Take 1 tablet (5 mg total) by mouth daily with breakfast. (do not take prior to starting Abiraterone) 90 tablet 1  ? sildenafil (VIAGRA) 100 MG tablet Take 1 tablet (100 mg total) by mouth daily as needed for erectile dysfunction. 30 tablet 0  ? sorbitol 70 % SOLN Take 15 mLs by mouth daily as needed. 473 mL 0  ?  tamsulosin (FLOMAX) 0.4 MG CAPS capsule Take 1 capsule (0.4 mg total) by mouth in the morning and at bedtime. 180 capsule 0  ? traZODone (DESYREL) 100 MG tablet Take 0.5 tablets (50 mg total) by mouth at bedtime. 90 tablet 1  ? albuterol (VENTOLIN HFA) 108 (90 Base) MCG/ACT inhaler Inhale 2 puffs into the lungs every 6 (six) hours as needed for wheezing or shortness of breath. 6.7 g 3  ? Fluticasone-Umeclidin-Vilant (TRELEGY ELLIPTA) 200-62.5-25 MCG/ACT AEPB Use once daily (Patient taking differently: Inhale 1 puff into the lungs daily. Use once daily) 1 each 3  ? ?No facility-administered medications prior to visit.  ? ? ?Review of Systems  ?Constitutional:  Negative for chills, diaphoresis, fever, malaise/fatigue and weight loss.  ?HENT:  Positive for congestion.   ?Respiratory:  Positive for cough, sputum production, shortness of breath and wheezing. Negative for hemoptysis.   ?Cardiovascular:  Negative for chest pain, palpitations and leg swelling.  ? ? ?Objective:  ? ?Vitals:  ? 06/12/21 1424  ?BP: 100/60  ?Pulse: 85  ?SpO2: 92%  ?Weight: 144 lb 9.6 oz (65.6 kg)  ?Height: '5\' 11"'$  (1.803 m)  ? ?SpO2: 92 % ?O2 Device: None (Room air) ? ?Physical Exam: ?General: Well-appearing, no acute distress ?HENT: [place], AT ?Eyes: EOMI, no scleral icterus ?Respiratory: Diminished breath sounds bilaterally.  No crackles, wheezing or rales ?Cardiovascular: RRR, -M/R/G, no JVD ?Extremities:-Edema,-tenderness ?Neuro: AAO x4, CNII-XII grossly intact ?Psych: Normal mood, normal affect ? ?Data Reviewed: ? ?Imaging: ?CT CAP 04/04/21 - Centrilobular and paraseptal emphysema. No pulmonary nodules ? ?PFT: ?None on file ? ?Labs: ?CBC ?   ?Component Value Date/Time  ? WBC 6.3 05/22/2021 0955  ? RBC 4.86 05/22/2021 0955  ? HGB 9.8 (L) 05/22/2021 0955  ? HGB 8.0 (L) 05/02/2021 0915  ? HCT 33.4 (L) 05/22/2021 0955  ? PLT 443 (H) 05/22/2021 0955  ? PLT 380 05/02/2021 0915  ? MCV 68.7 (L) 05/22/2021 0955  ? MCH 20.2 (L) 05/22/2021 0955  ? MCHC 29.3  (L) 05/22/2021 0955  ? RDW 24.0 (H) 05/22/2021 0955  ? LYMPHSABS 0.7 05/17/2021 1258  ? MONOABS 0.6 05/17/2021 1258  ? EOSABS 0.0 05/17/2021 1258  ? BASOSABS 0.0 05/17/2021 1258  ? ?Absolute 05/17/21 - 0 ? ?   ?Assessment & Plan:  ? ?Discussion: ?66 year old male active smoker with metastatic prostate cancer with bone involvement s/p Lupron injection and Zometa 01/17/21, COPD, HTN, GERD, IDA who presents for management of COPD. Uncontrolled symptoms. Not active at baseline. Low-normal O2. Discussed clinical course and management  of COPD including smoking cessation, bronchodilator regimen and action plan for exacerbation. ? ?Emphysema ?--CONTINUE Trelegy 200-62.5-25 mcg ONE puff ONCE a day. REFILL ?--CONTINUE Albuterol TWO puffs AS NEEDED for shortness of breath or wheezing. REFILL ?--ARRANGE for nebulizer. Albuterol nebs ordered ?--ORDER overnight oximetry ? ?Tobacco abuse ?Patient is an active smoker.  ?START with Step 2  ?We discussed smoking cessation for 5 minutes. We discussed triggers and stressors and ways to deal with them. We discussed barriers to continued smoking and benefits of smoking cessation. Provided patient with information cessation techniques and interventions including [place] quitline. ? ?Right lower extremity - worsening ?Prior 05/16/21 neg ?ORDER RLE DVT study ? ?Metastatic prostate cancer - no progressive findings on 03/2021 imaging ?Following Oncology  ?Pain control by Oncology ? ?Health Maintenance ?Immunization History  ?Administered Date(s) Administered  ? Influenza,inj,Quad PF,6+ Mos 11/01/2019  ? Pneumococcal Conjugate-13 11/01/2019  ? Tdap 11/01/2019  ? ?CT Lung Screen - not due. Once cancer surveillance completed, consider enrolling ? ?Orders Placed This Encounter  ?Procedures  ? Ambulatory Referral for DME  ?  Referral Priority:   Routine  ?  Referral Type:   Durable Medical Equipment Purchase  ?  Number of Visits Requested:   1  ? Pulse oximetry, overnight  ?  Standing Status:   Future  ?   Standing Expiration Date:   06/13/2022  ? ?Meds ordered this encounter  ?Medications  ? Fluticasone-Umeclidin-Vilant (TRELEGY ELLIPTA) 200-62.5-25 MCG/ACT AEPB  ?  Sig: Inhale 1 puff into the lungs daily. Use o

## 2021-06-12 NOTE — Patient Instructions (Addendum)
Emphysema ?--CONTINUE Trelegy 200-62.5-25 mcg ONE puff ONCE a day. REFILL ?--CONTINUE Albuterol TWO puffs AS NEEDED for shortness of breath or wheezing. REFILL ?--ARRANGE for nebulizer. Albuterol nebs ordered ?--ORDER overnight oximetry ? ?Tobacco abuse ?Patient is an active smoker.  ?START with Step 2. Then follow instructions ?We discussed smoking cessation for 5 minutes. We discussed triggers and stressors and ways to deal with them. We discussed barriers to continued smoking and benefits of smoking cessation. Provided patient with information cessation techniques and interventions including [place] quitline. ? ?Right lower extremity - worsening ?Prior 05/16/21 neg ?ORDER RLE DVT study ? ?Follow-up with me in 3 months ?

## 2021-06-15 ENCOUNTER — Ambulatory Visit (HOSPITAL_COMMUNITY)
Admission: RE | Admit: 2021-06-15 | Discharge: 2021-06-15 | Disposition: A | Discharge status: Home / Self Care | Payer: Medicare Other | Source: Ambulatory Visit | Attending: Pulmonary Disease | Admitting: Pulmonary Disease

## 2021-06-15 ENCOUNTER — Telehealth: Payer: Self-pay | Admitting: Pulmonary Disease

## 2021-06-15 DIAGNOSIS — R2241 Localized swelling, mass and lump, right lower limb: Secondary | ICD-10-CM

## 2021-06-15 NOTE — Telephone Encounter (Signed)
Great news! Please call patient and update him on the results. ? ?Lower extremity US of right leg negative for deep venous thrombosis ?

## 2021-06-15 NOTE — Telephone Encounter (Signed)
Called pt and spoke with daughter [name] letting her know the results of the doppler study that pt had done of his leg and she verbalized understanding. Nothing further needed. ?

## 2021-06-15 NOTE — Telephone Encounter (Signed)
Call report Negative for DVT in right leg ?

## 2021-06-20 ENCOUNTER — Telehealth: Payer: Self-pay | Admitting: Pulmonary Disease

## 2021-06-20 DIAGNOSIS — J432 Centrilobular emphysema: Secondary | ICD-10-CM

## 2021-06-20 NOTE — Telephone Encounter (Signed)
I pulled order - nothing further needed. ?

## 2021-06-20 NOTE — Telephone Encounter (Signed)
Order corrected. ONO on room air. ?

## 2021-06-20 NOTE — Telephone Encounter (Signed)
Dr Loanne Drilling put in ONO order yesterday for patient.  Order did not have instructions - on room air, on cpap or on O2.  Order needs to be corrected or new order placed please.  ?

## 2021-06-22 ENCOUNTER — Ambulatory Visit (INDEPENDENT_AMBULATORY_CARE_PROVIDER_SITE_OTHER): Payer: Medicare Other | Admitting: Student

## 2021-06-22 ENCOUNTER — Telehealth: Payer: Self-pay | Admitting: Pulmonary Disease

## 2021-06-22 ENCOUNTER — Telehealth: Payer: Self-pay | Admitting: *Deleted

## 2021-06-22 DIAGNOSIS — R2241 Localized swelling, mass and lump, right lower limb: Secondary | ICD-10-CM

## 2021-06-22 NOTE — Telephone Encounter (Signed)
Called and spoke with Andee Poles at [facility] who states that the orders for the nebulizer and the ONO have been placed on her end and that the patient may hear something on Monday about them. Called and spoke with patient's daughter [name] to let her know that info and to provide her with the number for [employer]. She expressed understanding. She also mentioned that patient may need to be walked at next OV to see if he qualifies for oxygen during the day. Nothing further needed at this time. ?

## 2021-06-22 NOTE — Assessment & Plan Note (Signed)
Daughter called today for concern of continued right calf swelling.  Patient has been consistently wearing compression stockings and elevating his legs per daughter.  Daughter states swelling remains fairly still more despite these measures with some minimal improvement on days he is elevating his legs more frequently.  States the swelling makes it uncomfortable for the patient to walk however does not note any drainage, erythema, warmth, numbness or tingling from the extremity. He had a repeat ultrasound on 5/5 which again did not show evidence of DVT or cystic structures.  No history of surgery or vascular procedures on the right lower extremity.  Advised that patient schedule a in person visit for further evaluate this since it does not seem his swelling is improved.  Daughter is agreeable with this.  We will have front desk call on Monday to schedule appointment with Dr. Darrick Meigs. ?

## 2021-06-22 NOTE — Telephone Encounter (Addendum)
Received call from patient's daughter stating patient's right calf has "doubled in size" since OV on 4/4. States u/s ruled out clot or blockage. Calf is "shiny from the skin being stretched out, and he is dragging his leg when he walks." Denies SHOB, CP. States there is no swelling in ankle or knee, just the calf. She is wondering if lasix would help. States he is elevating leg and using compression stocking during day without relief. ?

## 2021-06-22 NOTE — Progress Notes (Signed)
? ?  CC: right calf swelling ? ?This is a telephone encounter between Dustin Alvarez and Dustin Alvarez on 06/22/2021 for R calf swelling. The visit was conducted with the patient located at home and Dustin Alvarez at [facility]. The patient's identity was confirmed using their DOB and current address. The  daughter  has consented to being evaluated through a telephone encounter and understands the associated risks (an examination cannot be done and the patient may need to come in for an appointment) / benefits (allows the patient to remain at home, decreasing exposure to coronavirus). I personally spent 22 minutes on medical discussion.  ? ?HPI: ? ?Dustin Alvarez is a 66 y.o. with PMH as below.  ? ?Please see A&P for assessment of the patient's acute and chronic medical conditions.  ? ?Past Medical History:  ?Diagnosis Date  ? Asthma   ? COPD (chronic obstructive pulmonary disease) ([place])   ? GERD (gastroesophageal reflux disease)   ? Hypertension   ? Prostate cancer ([place])   ? ?Review of Systems:  negative as per HPI ? ?Assessment & Plan:  ? ?See Encounters Tab for problem based charting. ? ?Patient discussed with Dr. Dareen Piano ? ?

## 2021-06-25 ENCOUNTER — Encounter: Payer: Self-pay | Admitting: Hematology and Oncology

## 2021-06-25 ENCOUNTER — Other Ambulatory Visit (HOSPITAL_COMMUNITY): Payer: Self-pay

## 2021-06-25 ENCOUNTER — Inpatient Hospital Stay: Payer: Medicare Other | Attending: Hematology and Oncology | Admitting: Nurse Practitioner

## 2021-06-25 ENCOUNTER — Encounter: Payer: Self-pay | Admitting: Nurse Practitioner

## 2021-06-25 DIAGNOSIS — G893 Neoplasm related pain (acute) (chronic): Secondary | ICD-10-CM | POA: Insufficient documentation

## 2021-06-25 DIAGNOSIS — Z515 Encounter for palliative care: Secondary | ICD-10-CM

## 2021-06-25 DIAGNOSIS — K219 Gastro-esophageal reflux disease without esophagitis: Secondary | ICD-10-CM | POA: Insufficient documentation

## 2021-06-25 DIAGNOSIS — F17210 Nicotine dependence, cigarettes, uncomplicated: Secondary | ICD-10-CM | POA: Insufficient documentation

## 2021-06-25 DIAGNOSIS — K5903 Drug induced constipation: Secondary | ICD-10-CM

## 2021-06-25 DIAGNOSIS — J449 Chronic obstructive pulmonary disease, unspecified: Secondary | ICD-10-CM | POA: Insufficient documentation

## 2021-06-25 DIAGNOSIS — I10 Essential (primary) hypertension: Secondary | ICD-10-CM | POA: Insufficient documentation

## 2021-06-25 DIAGNOSIS — C61 Malignant neoplasm of prostate: Secondary | ICD-10-CM | POA: Diagnosis present

## 2021-06-25 DIAGNOSIS — N529 Male erectile dysfunction, unspecified: Secondary | ICD-10-CM | POA: Insufficient documentation

## 2021-06-25 DIAGNOSIS — C7951 Secondary malignant neoplasm of bone: Secondary | ICD-10-CM

## 2021-06-25 DIAGNOSIS — K5900 Constipation, unspecified: Secondary | ICD-10-CM | POA: Diagnosis not present

## 2021-06-25 MED ORDER — SORBITOL 70 % SOLN
15.0000 mL | Freq: Every day | 0 refills | Status: AC | PRN
  Filled 2021-06-25: qty 473, 31d supply, fill #0

## 2021-06-25 MED ORDER — XTAMPZA ER 36 MG PO C12A
36.0000 mg | EXTENDED_RELEASE_CAPSULE | Freq: Two times a day (BID) | ORAL | 0 refills | Status: DC | PRN
  Filled 2021-06-27: qty 60, 30d supply, fill #0

## 2021-06-25 MED ORDER — OXYCODONE HCL 5 MG PO TABS
5.0000 mg | ORAL_TABLET | ORAL | 0 refills | Status: DC | PRN
Start: 2021-06-29 — End: 2021-07-18
  Filled 2021-06-29: qty 180, 30d supply, fill #0

## 2021-06-25 NOTE — Progress Notes (Signed)
? ?  ?Palliative Medicine ?[place]  ?Telephone:([phone]) [phone] [phone] ? ? ?Name: Dustin Alvarez ?Date: 06/25/2021 ?MRN: [id number]  ?DOB: [birth year] ? ?Patient Care Team: ?[name], MD as PCP - General (Internal Medicine) ?Cira Rue, RN Nurse Navigator as Registered Nurse (Medical Oncology) ?Pickenpack-Cousar, Carlena Sax, NP as Nurse Practitioner (Nurse Practitioner)  ? ?I connected with Dustin Alvarez on 06/25/21 at 11:15 AM EDT by telephone and verified that I am speaking with the correct person using two identifiers.  ? ?I discussed the limitations, risks, security and privacy concerns of performing an evaluation and management service by telemedicine and the availability of in-person appointments. I also discussed with the patient that there may be a patient responsible charge related to this service. The patient expressed understanding and agreed to proceed.  ? ?Other persons participating in the visit and their role in the encounter: Daughter, [name]   ? ?Patient?s location: Home  ?Provider?s location: [place]   ? ?Chief Complaint: Symptom management follow-up ?  ?REASON FOR CONSULTATION: ?Dustin Alvarez is a 66 y.o. 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  Palliative ask to see for symptom management and goals of care.  ? ? ?SOCIAL HISTORY:    ? reports that he has been smoking cigarettes. He has a 53.00 pack-year smoking history. He has never used smokeless tobacco. He reports current alcohol use. He reports that he does not currently use drugs. ? ?ADVANCE DIRECTIVES:  ?Patient reports his children would be his medical decision makers. Does have MOST on file. Personally reviewed an discussed with no requested changes.   ? ?CODE STATUS: DNR ? ?PAST MEDICAL HISTORY: ?Past Medical History:   ?Diagnosis Date  ? Asthma   ? COPD (chronic obstructive pulmonary disease) ([place])   ? GERD (gastroesophageal reflux disease)   ? Hypertension   ? Prostate cancer ([place])   ? ? ?PAST SURGICAL HISTORY:  ?Past Surgical History:  ?Procedure Laterality Date  ? ABDOMINAL AORTOGRAM W/LOWER EXTREMITY N/A 10/26/2020  ? Procedure: ABDOMINAL AORTOGRAM W/LOWER EXTREMITY;  Surgeon: Marty Heck, MD;  Location: [facility];  Service: Cardiovascular;  Laterality: N/A;  ? COLONOSCOPY WITH PROPOFOL N/A 05/22/2021  ? Procedure: COLONOSCOPY WITH PROPOFOL;  Surgeon: Doran Stabler, MD;  Location: [facility];  Service: Gastroenterology;  Laterality: N/A;  ? ESOPHAGOGASTRODUODENOSCOPY (EGD) WITH PROPOFOL N/A 05/22/2021  ? Procedure: ESOPHAGOGASTRODUODENOSCOPY (EGD) WITH PROPOFOL;  Surgeon: Doran Stabler, MD;  Location: [facility];  Service: Gastroenterology;  Laterality: N/A;  ? HEMOSTASIS CLIP PLACEMENT  05/22/2021  ? Procedure: HEMOSTASIS CLIP PLACEMENT;  Surgeon: Doran Stabler, MD;  Location: [facility];  Service: Gastroenterology;;  ? HOT HEMOSTASIS N/A 05/22/2021  ? Procedure: HOT HEMOSTASIS (ARGON PLASMA COAGULATION/BICAP);  Surgeon: Doran Stabler, MD;  Location: [facility];  Service: Gastroenterology;  Laterality: N/A;  ? WRIST SURGERY Left 2017  ? ? ?HEMATOLOGY/ONCOLOGY HISTORY:  ?Oncology History  ? No history exists.  ? ? ?ALLERGIES:  is allergic to ace inhibitors and lisinopril. ? ?MEDICATIONS:  ?Current Outpatient Medications  ?Medication Sig Dispense Refill  ? abiraterone acetate (ZYTIGA) 250 MG tablet TAKE 4 TABLETS BY MOUTH DAILY. TAKE ON AN EMPTY STOMACH 1 HOUR BEFORE OR 2 HOURS AFTER A MEAL 120 tablet 2  ? albuterol (PROVENTIL) (2.5 MG/3ML) 0.083% nebulizer solution Take 3 mLs (2.5 mg total) by  nebulization every 6 (six) hours as needed for wheezing or shortness of breath. 180 mL 5  ? albuterol (VENTOLIN HFA) 108 (90 Base) MCG/ACT inhaler Inhale 2 puffs into the lungs every 6 (six)  hours as needed for wheezing or shortness of breath. 6.7 g 5  ? amLODipine (NORVASC) 5 MG tablet Take 1 tablet (5 mg total) by mouth daily. 90 tablet 1  ? calcium-vitamin D (OSCAL WITH D) 500-200 MG-UNIT tablet Take 1 tablet by mouth daily with breakfast. 90 tablet 3  ? docusate sodium (COLACE) 100 MG capsule Take 200 mg by mouth daily.    ? finasteride (PROSCAR) 5 MG tablet Take 1 tablet (5 mg total) by mouth daily. 90 tablet 1  ? Fluticasone-Umeclidin-Vilant (TRELEGY ELLIPTA) 200-62.5-25 MCG/ACT AEPB Inhale 1 puff into the lungs daily. Use once daily 60 each 11  ? gabapentin (NEURONTIN) 300 MG capsule Take 2 capsules (600 mg total) by mouth 2 (two) times daily. 120 capsule 2  ? lidocaine (LIDODERM) 5 % Place 1 patch onto the skin daily. Remove & Discard patch within 12 hours or as directed by MD 30 patch 0  ? nicotine (NICOTROL) 10 MG inhaler Inhale 1 continuous puffing into the lungs as needed for smoking cessation.    ? [START ON 06/29/2021] oxyCODONE (OXY IR/ROXICODONE) 5 MG immediate release tablet Take 1 tablet (5 mg total) by mouth every 4 (four) hours as needed for severe pain, breakthrough pain or moderate pain. 180 tablet 0  ? [START ON 06/27/2021] oxyCODONE ER (XTAMPZA ER) 36 MG C12A Take 1 capsule by mouth every 12 hours as needed. 60 capsule 0  ? pantoprazole (PROTONIX) 40 MG tablet Take 1 tablet (40 mg total) by mouth daily. 90 tablet 3  ? polyethylene glycol (MIRALAX) 17 g packet Mix 17 grams (1 packet) with 4-8 ounces of liquid and take by mouth 2 (two) times daily. 60 each 2  ? predniSONE (DELTASONE) 5 MG tablet Take 1 tablet (5 mg total) by mouth daily with breakfast. (do not take prior to starting Abiraterone) 90 tablet 1  ? sildenafil (VIAGRA) 100 MG tablet Take 1 tablet (100 mg total) by mouth daily as needed for erectile dysfunction. 30 tablet 0  ? sorbitol 70 % SOLN Take 15 mLs by mouth daily as needed. 473 mL 0  ? tamsulosin (FLOMAX) 0.4 MG CAPS capsule Take 1 capsule (0.4 mg total) by mouth  in the morning and at bedtime. 180 capsule 0  ? traZODone (DESYREL) 100 MG tablet Take 0.5 tablets (50 mg total) by mouth at bedtime. 90 tablet 1  ? ?No current facility-administered medications for this visit.  ? ? ?VITAL SIGNS: ?There were no vitals taken for this visit. ?There were no vitals filed for this visit. ?  ?Estimated body mass index is 20.17 kg/m? as calculated from the following: ?  Height as of 06/12/21: '5\' 11"'$  (1.803 m). ?  Weight as of 06/12/21: 144 lb 9.6 oz (65.6 kg). ? ? ?PERFORMANCE STATUS (ECOG) : 1 - Symptomatic but completely ambulatory ? ?IMPRESSION: ?I was able to connect with Dustin Alvarez and his daughter via phone for symptom management follow-up. He continues to be able to provide most care for himself in the home with some assistance with ADLs and meals. Appetite is good. Denies any weight loss. Is complaining of ongoing lower extremity edema and discomfort at times. He had a recent doppler which was negative for DVT. He is planning to schedule a visit with his PCP for further follow-up.  ? ?  1.  Neoplasm related pain ?Mr. Arlington reports pain is ongoing but improved on current regimen.  ? ?We reviewed at length his current pain regimen which consists of Xtampza 36 mg twice daily, Oxy IR 5-10 mg every 4 hours as needed for breakthrough pain, and gabapentin 600 mg twice daily. Per patient and daughter he is taking all medications as prescribed. He feels his pain is well controlled. Is trying to be as active as he can in the home and community.  ? ?Reports lidocaine patches provides some additional relief also.  ? ?Does endorse some relief on Xtampza however does not feel that it is as effective as it was when he was taking OxyContin.  Understands due to insurance restrictions Ginger Organ is preferred medication.  We will plan to continue to monitor and make adjustments as needed.  Could consider Xtampza every 8 hours versus 12 if pain continues to be of concern and uncontrolled.  He is taking 10  mg Oxy IR every 4 hours as needed and feels some relief when taking for breakthrough.  We will continue on this regimen and again emphasized close monitoring. ? ? ?2.  Constipation ?Jamil does endorse ongoing c

## 2021-06-25 NOTE — Progress Notes (Signed)
Internal Medicine Clinic Attending ? ?Case discussed with Dr. Liang  At the time of the visit.  We reviewed the resident?s history and exam and pertinent patient test results.  I agree with the assessment, diagnosis, and plan of care documented in the resident?s note. ? ?

## 2021-06-26 ENCOUNTER — Other Ambulatory Visit (HOSPITAL_COMMUNITY): Payer: Self-pay

## 2021-06-26 ENCOUNTER — Other Ambulatory Visit: Payer: Self-pay | Admitting: Hematology and Oncology

## 2021-06-27 ENCOUNTER — Other Ambulatory Visit: Payer: Self-pay

## 2021-06-27 ENCOUNTER — Encounter: Payer: Self-pay | Admitting: Hematology and Oncology

## 2021-06-27 ENCOUNTER — Encounter: Payer: Self-pay | Admitting: Internal Medicine

## 2021-06-27 ENCOUNTER — Ambulatory Visit (HOSPITAL_COMMUNITY)
Admission: RE | Admit: 2021-06-27 | Discharge: 2021-06-27 | Disposition: A | Discharge status: Home / Self Care | Payer: Medicare Other | Source: Ambulatory Visit | Attending: Internal Medicine | Admitting: Internal Medicine

## 2021-06-27 ENCOUNTER — Ambulatory Visit (INDEPENDENT_AMBULATORY_CARE_PROVIDER_SITE_OTHER): Payer: Medicare Other | Admitting: Internal Medicine

## 2021-06-27 ENCOUNTER — Other Ambulatory Visit (HOSPITAL_COMMUNITY): Payer: Self-pay

## 2021-06-27 VITALS — BP 107/60 | HR 83 | Temp 98.3°F | Ht 71.0 in | Wt 145.4 lb

## 2021-06-27 DIAGNOSIS — M79601 Pain in right arm: Secondary | ICD-10-CM

## 2021-06-27 DIAGNOSIS — I890 Lymphedema, not elsewhere classified: Secondary | ICD-10-CM

## 2021-06-27 DIAGNOSIS — R52 Pain, unspecified: Secondary | ICD-10-CM | POA: Diagnosis present

## 2021-06-27 MED ORDER — ABIRATERONE ACETATE 250 MG PO TABS
ORAL_TABLET | ORAL | 2 refills | Status: DC
  Filled 2021-06-27: qty 120, 30d supply, fill #0
  Filled 2021-07-23: qty 120, 30d supply, fill #1
  Filled 2021-08-22: qty 120, 30d supply, fill #2

## 2021-06-27 NOTE — Assessment & Plan Note (Addendum)
He presents for reevaluation of right lower extremity swelling.  He has been seen multiple times for this issue and has undergone 2 venous duplex scans which have been negative for DVT.  He additionally underwent a CT chest abdomen pelvis and surveillance PET scan in February which did not reveal any obstructive type of adenopathy. ?He denies any significant change in the swelling or any new symptoms.  Right calf is measuring approximately 38 cm, left calf is about 26 cm.  Swelling does not extend above the knee.  Leg is not tender. ?Assessment: ?At this point, I would attribute the increased swelling to lymphedema and I do not think it needs further diagnostic work-up at this time.  I discussed the usual conservative treatments which would typically include elevation and compression however explained that I would be hesitant to recommend compression in his case due to his severe PAD. ?Plan ?- At this point, I would just recommend leg elevation.  I encouraged him to discuss compression with his vascular surgeon at his next follow-up with them. ?

## 2021-06-27 NOTE — Assessment & Plan Note (Signed)
Reportedly began experiencing right arm pain and some twinges in his chest earlier today.  This occurred while he was sitting down, not exerting himself.  I did obtain an EKG in the office today which does not show any major ischemic changes.  He notes that he has not been sleeping well and suspects that his right shoulder/arm can be attributed to this.  Strict return precautions and indications to seek evaluation in the ED reviewed.  No further work-up at this time. ?

## 2021-06-27 NOTE — Progress Notes (Signed)
? ?  Office Visit ? ? Patient ID: Dustin Alvarez, male    DOB: 08-Feb-1956, 66 y.o.   MRN: [id number]   PCP: [name], MD  ? ?Subjective:  ?CC: Pain (BILATERAL LEG PAIN WITH SWELLING -CHRONIC / ? MEDICATION REFILL)  ? ?Dustin Alvarez is a 66 y.o. year old male who presents for the above medical condition(s). Please refer to problem based charting for assessment and plan. ? ? ? ?Objective:  ? ?BP 107/60 (BP Location: Right Arm, Patient Position: Sitting, Cuff Size: Normal)   Pulse 83   Temp 98.3 ?F (36.8 ?C) (Oral)   Ht '5\' 11"'$  (1.803 m)   Wt 145 lb 6.4 oz (66 kg)   SpO2 90%   BMI 20.28 kg/m?  ? ?General: Chronically ill-appearing male in no acute distress ?Cardiac: Heart regular rate and rhythm.  Peripheral extremities are warm ?Pulm: Breathing comfortably on room air, lung sounds are clear throughout ?MSK: +3 pitting edema to the right lower extremity.  Mid calf circumference sugars 38 cm on the right and 26 cm on the left. ?Assessment & Plan:  ? ? ? Lymphedema of right lower extremity  ?  He presents for reevaluation of right lower extremity swelling.  He has been seen multiple times for this issue and has undergone 2 venous duplex scans which have been negative for DVT.  He additionally underwent a CT chest abdomen pelvis and surveillance PET scan in February which did not reveal any obstructive type of adenopathy. ?He denies any significant change in the swelling or any new symptoms.  Right calf is measuring approximately 38 cm, left calf is about 26 cm.  Swelling does not extend above the knee.  Leg is not tender. ?Assessment: ?At this point, I would attribute the increased swelling to lymphedema and I do not think it needs further diagnostic work-up at this time.  I discussed the usual conservative treatments which would typically include elevation and compression however explained that I would be hesitant to recommend compression in his case due to his severe PAD. ?Plan ?- At this point, I  would just recommend leg elevation.  I encouraged him to discuss compression with his vascular surgeon at his next follow-up with them. ? ?  ?  ? Right arm pain  ?  Reportedly began experiencing right arm pain and some twinges in his chest earlier today.  This occurred while he was sitting down, not exerting himself.  I did obtain an EKG in the office today which does not show any major ischemic changes.  He notes that he has not been sleeping well and suspects that his right shoulder/arm can be attributed to this.  Strict return precautions and indications to seek evaluation in the ED reviewed.  No further work-up at this time. ? ?  ?  ? ?Follow-up in 2 to 3 months with PCP ? ?Follow-ups ?- Oncology on 6/7 ?- Vascular surgery on 6/27 ?- Pulmonology 8/7 ? ? ? ?Pt discussed with Dr. [name] ? ?[name], MD ?Internal Medicine Resident PGY-3 ?[facility] ?06/27/2021 3:33 PM  ?  ?

## 2021-06-28 ENCOUNTER — Other Ambulatory Visit (HOSPITAL_COMMUNITY): Payer: Self-pay

## 2021-06-29 ENCOUNTER — Other Ambulatory Visit (HOSPITAL_COMMUNITY): Payer: Self-pay

## 2021-07-03 NOTE — Progress Notes (Signed)
Internal Medicine Clinic Attending  Case discussed with the resident at the time of the visit.  We reviewed the resident's history and exam and pertinent patient test results.  I agree with the assessment, diagnosis, and plan of care documented in the resident's note.  

## 2021-07-04 ENCOUNTER — Other Ambulatory Visit (HOSPITAL_COMMUNITY): Payer: Self-pay

## 2021-07-12 ENCOUNTER — Telehealth: Payer: Self-pay | Admitting: Pulmonary Disease

## 2021-07-12 DIAGNOSIS — G4734 Idiopathic sleep related nonobstructive alveolar hypoventilation: Secondary | ICD-10-CM

## 2021-07-12 NOTE — Telephone Encounter (Signed)
Overnight Oximetry  06/28/21 SpO2 <88% 3h 39 min 53 sec Nadir SpO2  63%  Nocturnal Hypoxemia START 2L O2 via [place] nightly Please notify patient

## 2021-07-13 NOTE — Telephone Encounter (Signed)
Called patient this after noon on 07/13/2021 and informed him of the new oxygen order for night time use based off of his ONO that he had done. Patient verbalized understanding. Order placed today. Advised patient that he can NOT smoke with the oxygen on at night. Advised him that if he wants to smoke he needs to go outside. Nothing further needed

## 2021-07-18 ENCOUNTER — Inpatient Hospital Stay: Payer: Medicare Other | Attending: Hematology and Oncology | Admitting: Hematology and Oncology

## 2021-07-18 ENCOUNTER — Inpatient Hospital Stay: Payer: Medicare Other

## 2021-07-18 ENCOUNTER — Encounter: Payer: Self-pay | Admitting: Nurse Practitioner

## 2021-07-18 ENCOUNTER — Encounter: Payer: Medicare Other | Admitting: Nurse Practitioner

## 2021-07-18 ENCOUNTER — Inpatient Hospital Stay (HOSPITAL_BASED_OUTPATIENT_CLINIC_OR_DEPARTMENT_OTHER): Payer: Medicare Other | Admitting: Nurse Practitioner

## 2021-07-18 ENCOUNTER — Other Ambulatory Visit: Payer: Self-pay

## 2021-07-18 VITALS — BP 120/77 | HR 78 | Temp 97.8°F | Resp 15 | Wt 144.5 lb

## 2021-07-18 DIAGNOSIS — K5900 Constipation, unspecified: Secondary | ICD-10-CM | POA: Diagnosis not present

## 2021-07-18 DIAGNOSIS — Z79899 Other long term (current) drug therapy: Secondary | ICD-10-CM | POA: Diagnosis not present

## 2021-07-18 DIAGNOSIS — I10 Essential (primary) hypertension: Secondary | ICD-10-CM | POA: Insufficient documentation

## 2021-07-18 DIAGNOSIS — K219 Gastro-esophageal reflux disease without esophagitis: Secondary | ICD-10-CM | POA: Insufficient documentation

## 2021-07-18 DIAGNOSIS — J449 Chronic obstructive pulmonary disease, unspecified: Secondary | ICD-10-CM | POA: Diagnosis not present

## 2021-07-18 DIAGNOSIS — C7951 Secondary malignant neoplasm of bone: Secondary | ICD-10-CM

## 2021-07-18 DIAGNOSIS — R530 Neoplastic (malignant) related fatigue: Secondary | ICD-10-CM

## 2021-07-18 DIAGNOSIS — Z66 Do not resuscitate: Secondary | ICD-10-CM | POA: Insufficient documentation

## 2021-07-18 DIAGNOSIS — G893 Neoplasm related pain (acute) (chronic): Secondary | ICD-10-CM | POA: Diagnosis not present

## 2021-07-18 DIAGNOSIS — C61 Malignant neoplasm of prostate: Secondary | ICD-10-CM | POA: Insufficient documentation

## 2021-07-18 DIAGNOSIS — N529 Male erectile dysfunction, unspecified: Secondary | ICD-10-CM | POA: Diagnosis not present

## 2021-07-18 DIAGNOSIS — K5903 Drug induced constipation: Secondary | ICD-10-CM

## 2021-07-18 DIAGNOSIS — Z515 Encounter for palliative care: Secondary | ICD-10-CM

## 2021-07-18 DIAGNOSIS — F17210 Nicotine dependence, cigarettes, uncomplicated: Secondary | ICD-10-CM | POA: Insufficient documentation

## 2021-07-18 LAB — CBC WITH DIFFERENTIAL (CANCER CENTER ONLY)
Abs Immature Granulocytes: 0.01 10*3/uL (ref 0.00–0.07)
Basophils Absolute: 0 10*3/uL (ref 0.0–0.1)
Basophils Relative: 1 %
Eosinophils Absolute: 0 10*3/uL (ref 0.0–0.5)
Eosinophils Relative: 1 %
HCT: 38.9 % — ABNORMAL LOW (ref 39.0–52.0)
Hemoglobin: 12.2 g/dL — ABNORMAL LOW (ref 13.0–17.0)
Immature Granulocytes: 0 %
Lymphocytes Relative: 15 %
Lymphs Abs: 0.7 10*3/uL (ref 0.7–4.0)
MCH: 24.1 pg — ABNORMAL LOW (ref 26.0–34.0)
MCHC: 31.4 g/dL (ref 30.0–36.0)
MCV: 76.9 fL — ABNORMAL LOW (ref 80.0–100.0)
Monocytes Absolute: 0.4 10*3/uL (ref 0.1–1.0)
Monocytes Relative: 9 %
Neutro Abs: 3.2 10*3/uL (ref 1.7–7.7)
Neutrophils Relative %: 74 %
Platelet Count: 293 10*3/uL (ref 150–400)
RBC: 5.06 MIL/uL (ref 4.22–5.81)
RDW: 22.8 % — ABNORMAL HIGH (ref 11.5–15.5)
WBC Count: 4.3 10*3/uL (ref 4.0–10.5)
nRBC: 0 % (ref 0.0–0.2)

## 2021-07-18 LAB — CMP (CANCER CENTER ONLY)
ALT: 10 U/L (ref 0–44)
AST: 16 U/L (ref 15–41)
Albumin: 3.9 g/dL (ref 3.5–5.0)
Alkaline Phosphatase: 84 U/L (ref 38–126)
Anion gap: 7 (ref 5–15)
BUN: 12 mg/dL (ref 8–23)
CO2: 30 mmol/L (ref 22–32)
Calcium: 9.4 mg/dL (ref 8.9–10.3)
Chloride: 105 mmol/L (ref 98–111)
Creatinine: 0.79 mg/dL (ref 0.61–1.24)
GFR, Estimated: >60 mL/min (ref >60)
Glucose, Bld: 109 mg/dL — ABNORMAL HIGH (ref 70–99)
Potassium: 3.9 mmol/L (ref 3.5–5.1)
Sodium: 142 mmol/L (ref 135–145)
Total Bilirubin: 0.3 mg/dL (ref 0.3–1.2)
Total Protein: 7.9 g/dL (ref 6.5–8.1)

## 2021-07-18 MED ORDER — OXYCODONE HCL 5 MG PO TABS: 5.0000 mg | ORAL_TABLET | ORAL | 0 refills | Status: DC | PRN

## 2021-07-18 MED ORDER — XTAMPZA ER 36 MG PO C12A: 36.0000 mg | EXTENDED_RELEASE_CAPSULE | Freq: Three times a day (TID) | ORAL | 0 refills | Status: DC

## 2021-07-18 MED ORDER — LIDOCAINE 5 % EX PTCH: 1.0000 | MEDICATED_PATCH | CUTANEOUS | 0 refills | Status: DC

## 2021-07-18 NOTE — Progress Notes (Signed)
[place] [phone]   [phone]  PROGRESS NOTE  Patient Care Team: [name], MD as PCP - General (Internal Medicine) Cira Rue, RN Nurse Navigator as Registered Nurse (Medical Oncology) Pickenpack-Cousar, Carlena Sax, NP as Nurse Practitioner (Nurse Practitioner)  Hematological/Oncological History # Metastatic Castrate Sensitive Prostate Cancer, Metastatic to Bone 1) 07/13/2019: Abdomen/Pelvis CT extensive lytic changes with superimposed pathological fractures. Lytic change noted in the right iliac bone and new lucencies in the right T10 vertebrae.  2) 07/19/2019: biopsy of sacral mass shows metastatic prostatic adenocarcinoma, Gleason 4+4.  3) 07/2019: reportedly received Zometa 4g IV and eligard 22.5. Started on Casodex.  4) 6/10-6/16/2021: received palliative radiation to the sacrum at [place]. Received 2000cGy over 6 days 5) Moved to [place]. Lost to follow up from [facility] in [place], [place]. 6) 11/05/2019: establish care with Dr. Lorenso Courier  7) 11/16/2019: Administered Zometa 6m IV and Lupron 22.5 mg 8) 12/08/2019: started abiraterone 10081mPO daily  9) 03/09/2020: Administered Zometa 47m41mV and Lupron 22.5 mg 10) 06/01/2020: Administered Zometa 47mg70m and Lupron 22.5 mg 11) No show for interval zometa/lupron 12) 01/17/2021: Administered Zometa 47mg 47mand Lupron 22.5 mg 13) 04/13/2021: Administered Lupron 22.5 mg  Interval History:  Dustin Alvarez.66 male with medical history significant for metastatic prostate cancer who presents for a follow up visit. The patient's last visit was on 04/13/2020. In the interim since the last visit he has had no major changes in his health.   On exam today [name] he has continued to struggle with pain in his legs.  He reports he will be seeing Dr. CarteEulas Post week or 2 for further evaluation.  He notes that he does get some more swelling in his right leg.  Sometimes are so bad  he is unable to walk.  He notes that he is not supposed to be using compression socks because these harm his circulation.  He reports otherwise he has been quite well.  His weight has been steady and his appetite has been good.  He notes he is faithfully taking his abiraterone and prednisone therapy.  Overall he has been at his baseline level of health and is willing and able to proceed with continued Lupron shots.  He otherwise denies any fevers, chills, sweats, nausea, vomiting or diarrhea.  Full 10 point ROS is listed below.    MEDICAL HISTORY:  Past Medical History:  Diagnosis Date   Asthma    COPD (chronic obstructive pulmonary disease) (HCC)    GERD (gastroesophageal reflux disease)    Hypertension    Prostate cancer (HCC) [place] SURGICAL HISTORY: Past Surgical History:  Procedure Laterality Date   ABDOMINAL AORTOGRAM W/LOWER EXTREMITY N/A 10/26/2020   Procedure: ABDOMINAL AORTOGRAM W/LOWER EXTREMITY;  Surgeon: ClarkMarty Heck  [facility];  Service: Cardiovascular;  Laterality: N/A;   COLONOSCOPY WITH PROPOFOL N/A 05/22/2021   Procedure: COLONOSCOPY WITH PROPOFOL;  Surgeon: DanisDoran Stabler  [facility]: [facility];  Service: Gastroenterology;  Laterality: N/A;   ESOPHAGOGASTRODUODENOSCOPY (EGD) WITH PROPOFOL N/A 05/22/2021   Procedure: ESOPHAGOGASTRODUODENOSCOPY (EGD) WITH PROPOFOL;  Surgeon: DanisDoran Stabler  [facility]: [facility];  Service: Gastroenterology;  Laterality: N/A;   HEMOSTASIS CLIP PLACEMENT  05/22/2021   Procedure: HEMOSTASIS CLIP PLACEMENT;  Surgeon: DanisDoran Stabler  [facility]: [facility];  Service: Gastroenterology;;   HOT HEMOSTASIS N/A 05/22/2021   Procedure: HOT HEMOSTASIS (ARGON PLASMA  COAGULATION/BICAP);  Surgeon: Doran Stabler, MD;  Location: [facility];  Service: Gastroenterology;  Laterality: N/A;   WRIST SURGERY Left 2017    SOCIAL HISTORY: Social History   Socioeconomic History   Marital status:  Widowed    Spouse name: Not on file   Number of children: Not on file   Years of education: Not on file   Highest education level: Not on file  Occupational History   Not on file  Tobacco Use   Smoking status: Some Days    Packs/day: 1.00    Years: 53.00    Pack years: 53.00    Types: Cigarettes   Smokeless tobacco: Never   Tobacco comments:    0.5 PPD/Wants patches   Vaping Use   Vaping Use: Never used  Substance and Sexual Activity   Alcohol use: Yes    Comment: 1-2 drinks per week.   Drug use: Not Currently   Sexual activity: Not Currently  Other Topics Concern   Not on file  Social History Narrative   Not on file   Social Determinants of Health   Financial Resource Strain: Not on file  Food Insecurity: Not on file  Transportation Needs: Not on file  Physical Activity: Not on file  Stress: Not on file  Social Connections: Not on file  Intimate Partner Violence: Not on file    FAMILY HISTORY: Family History  Problem Relation Age of Onset   COPD Father    Diabetes Sister    Diabetes Brother    Breast cancer Neg Hx    Prostate cancer Neg Hx    Colon cancer Neg Hx    Pancreatic cancer Neg Hx    Stomach cancer Neg Hx    Esophageal cancer Neg Hx     ALLERGIES:  is allergic to ace inhibitors and lisinopril.  MEDICATIONS:  Current Outpatient Medications  Medication Sig Dispense Refill   abiraterone acetate (ZYTIGA) 250 MG tablet TAKE 4 TABLETS BY MOUTH DAILY. TAKE ON AN EMPTY STOMACH 1 HOUR BEFORE OR 2 HOURS AFTER A MEAL 120 tablet 2   albuterol (PROVENTIL) (2.5 MG/3ML) 0.083% nebulizer solution Take 3 mLs (2.5 mg total) by nebulization every 6 (six) hours as needed for wheezing or shortness of breath. 180 mL 5   albuterol (VENTOLIN HFA) 108 (90 Base) MCG/ACT inhaler Inhale 2 puffs into the lungs every 6 (six) hours as needed for wheezing or shortness of breath. 6.7 g 5   amLODipine (NORVASC) 5 MG tablet Take 1 tablet (5 mg total) by mouth daily. 90 tablet 1    calcium-vitamin D (OSCAL WITH D) 500-200 MG-UNIT tablet Take 1 tablet by mouth daily with breakfast. 90 tablet 3   docusate sodium (COLACE) 100 MG capsule Take 200 mg by mouth daily.     finasteride (PROSCAR) 5 MG tablet Take 1 tablet (5 mg total) by mouth daily. 90 tablet 1   Fluticasone-Umeclidin-Vilant (TRELEGY ELLIPTA) 200-62.5-25 MCG/ACT AEPB Inhale 1 puff into the lungs daily. Use once daily 60 each 11   gabapentin (NEURONTIN) 300 MG capsule Take 2 capsules (600 mg total) by mouth 2 (two) times daily. 120 capsule 2   lidocaine (LIDODERM) 5 % Place 1 patch onto the skin daily. Remove & Discard patch within 12 hours or as directed by MD 30 patch 0   nicotine (NICOTROL) 10 MG inhaler Inhale 1 continuous puffing into the lungs as needed for smoking cessation.     oxyCODONE (OXY IR/ROXICODONE) 5 MG immediate release tablet Take  1-2 tablets (5-10 mg total) by mouth every 4 (four) hours as needed for severe pain, breakthrough pain or moderate pain. 180 tablet 0   oxyCODONE ER (XTAMPZA ER) 36 MG C12A Take 1 capsule (36 mg total) by mouth every 8 (eight) hours. 60 capsule 0   pantoprazole (PROTONIX) 40 MG tablet Take 1 tablet (40 mg total) by mouth daily. 90 tablet 3   polyethylene glycol (MIRALAX) 17 g packet Mix 17 grams (1 packet) with 4-8 ounces of liquid and take by mouth 2 (two) times daily. 60 each 2   predniSONE (DELTASONE) 5 MG tablet Take 1 tablet (5 mg total) by mouth daily with breakfast. (do not take prior to starting Abiraterone) 90 tablet 1   sildenafil (VIAGRA) 100 MG tablet Take 1 tablet (100 mg total) by mouth daily as needed for erectile dysfunction. 30 tablet 0   sorbitol 70 % SOLN Take 15 mLs by mouth daily as needed. 473 mL 0   tamsulosin (FLOMAX) 0.4 MG CAPS capsule Take 1 capsule (0.4 mg total) by mouth in the morning and at bedtime. 180 capsule 0   traZODone (DESYREL) 100 MG tablet Take 0.5 tablets (50 mg total) by mouth at bedtime. 90 tablet 1   No current  facility-administered medications for this visit.    REVIEW OF SYSTEMS:   Constitutional: ( - ) fevers, ( - )  chills , ( - ) night sweats Eyes: ( - ) blurriness of vision, ( - ) double vision, ( - ) watery eyes Ears, nose, mouth, throat, and face: ( - ) mucositis, ( - ) sore throat Respiratory: ( - ) cough, ( - ) dyspnea, ( - ) wheezes Cardiovascular: ( - ) palpitation, ( - ) chest discomfort, ( - ) lower extremity swelling Gastrointestinal:  ( - ) nausea, ( - ) heartburn, ( - ) change in bowel habits Skin: ( - ) abnormal skin rashes Lymphatics: ( - ) new lymphadenopathy, ( - ) easy bruising Neurological: ( - ) numbness, ( - ) tingling, ( - ) new weaknesses Behavioral/Psych: ( - ) mood change, ( - ) new changes  All other systems were reviewed with the patient and are negative.  PHYSICAL EXAMINATION:  There were no vitals filed for this visit.   There were no vitals filed for this visit.    GENERAL: well appearing elderly African American male in NAD. SKIN: skin color, texture, turgor are normal, no rashes or significant lesions EYES: conjunctiva are pink and non-injected, sclera clear LUNGS: clear to auscultation and percussion with normal breathing effort HEART: regular rate & rhythm and no murmurs and no lower extremity edema Musculoskeletal: no cyanosis of digits and no clubbing  PSYCH: alert & oriented x 3, fluent speech NEURO: no focal motor/sensory deficits  LABORATORY DATA:  I have reviewed the data as listed    Latest Ref Rng & Units 07/18/2021    2:52 PM 05/22/2021    9:55 AM 05/17/2021   12:58 PM  CBC  WBC 4.0 - 10.5 K/uL 4.3   6.3   5.9    Hemoglobin 13.0 - 17.0 g/dL 12.2   9.8   7.9 Repeated and verified X2.    Hematocrit 39.0 - 52.0 % 38.9   33.4   26.7    Platelets 150 - 400 K/uL 293   443   320.0         Latest Ref Rng & Units 07/18/2021    2:52 PM 05/15/2021    3:19  PM 05/02/2021    9:15 AM  CMP  Glucose 70 - 99 mg/dL 109   89   97    BUN 8 - 23 mg/dL 12    11   10     Creatinine 0.61 - 1.24 mg/dL 0.79   0.68   0.80    Sodium 135 - 145 mmol/L 142   137   138    Potassium 3.5 - 5.1 mmol/L 3.9   4.2   3.3    Chloride 98 - 111 mmol/L 105   98   101    CO2 22 - 32 mmol/L 30   25   30     Calcium 8.9 - 10.3 mg/dL 9.4   9.8   9.0    Total Protein 6.5 - 8.1 g/dL 7.9    7.3    Total Bilirubin 0.3 - 1.2 mg/dL 0.3    0.6    Alkaline Phos 38 - 126 U/L 84    90    AST 15 - 41 U/L 16    8    ALT 0 - 44 U/L 10    8      RADIOGRAPHIC STUDIES: No results found.  ASSESSMENT & PLAN Dustin Alvarez 66 y.o. male with medical history significant for metastatic prostate cancer who presents for a follow up visit.  Mr. Riecke has excessively transitioned off the Casodex and has received his first dosages of Zometa and Lupron.  His PSA continues to trend downward and is testosterone is at castrate level.  Given this we started abiraterone therapy 1000 mg p.o. daily with 5 mg of prednisone.   The major symptom the patient has been experiencing has been pain.  His spinal sacral lesion has been poorly controlled on oxycodone therapy.  He did previously receive radiation therapy in June 2021, but due to continued excruciating pain we asked for him to be reexamined by radiation oncology to see if there is any further intervention they can offer at this time. He has had further palliative radiation, though this has not relieved his pain. Continue OxyContin to 30 mg twice daily (per his request, issues with constipation at higher doses) and additionally continue his as needed fast acting oxycodone. We will continue gabapentin at 362m BID.  We will plan to have him back in 12 weeks for clinic visit.  PSA 07/13/2019: PSA 819 08/27/2019: 226 11/05/2019: 44.8 12/08/2019: 27.3 12/27/2019: 21.2  02/24/2020: 12.1 03/22/2020: PSA 8.8 05/18/2020: PSA 4.1 07/31/2020: PSA 2.8 11/16/2020: PSA 1.6  # Metastatic Castrate Sensitive Prostate Cancer, Metastatic to Bone --findings are most  consistent with metastatic adenocarcinoma of the prostate.  --testosterone at last check was <3, PSA down to 1.1 ( from 819 at diagnosis)  --Administered  Lupron 22.5 mg today and zometa at next opportunity. Continue q 3 months.  --continue abiraterone 100526mwith prednisone 26m51mO daily. Assure he is compliant with the full dose and steroids.  --patient due for a repeat CT scan and NM bone scan in August 2023. Repeat q 6 months or based on PSA response.  --RTC in 12 weeks for continued monitoring on Abiraterone therapy +zometa/lupron.   # Erectile Dysfunction --patient trialed on cialis and Viagra with sub-optimal results --patient requesting "something stronger" --referred to urology for assistance in managing his ED.    #Pain Control --gabapentin 600 mg BID to help with pain.  --continue oxycodone 5-73m37mH PRN for pain control. --continue Xtampza 36 mg q12H for basal pain control --established care  with Radiation Oncology to help with painful bone lesions. Radiation therapy completed 02/01/2021.  --seen by neurosurgery, provided injections with no relief.  -- Met with vascular surgery regarding the concern for poor circulation of the lower extremities.  He declined operative management.  They plan to see him back to monitor ABIs --patient established care with palliative care for management of his pain.   No orders of the defined types were placed in this encounter.   All questions were answered. The patient knows to call the clinic with any problems, questions or concerns.  A total of more than 30 minutes were spent on this encounter and over half of that time was spent on counseling and coordination of care as outlined above.   Ledell Peoples, MD Department of Hematology/Oncology [place] at [facility] Phone: [phone] Pager: [phone] Email: [email or web address]  07/18/2021 5:12 PM

## 2021-07-18 NOTE — Progress Notes (Signed)
[place]  [phone] [phone]) [phone]   Name: TERRANCE USERY Date: 07/18/2021 MRN: [id number]  DOB: [birth year]  Patient Care Team: [name], MD as PCP - General (Internal Medicine) Cira Rue, RN Nurse Navigator as Registered Nurse (Medical Oncology) Pickenpack-Cousar, Carlena Sax, NP as Nurse Practitioner (Nurse Practitioner)     REASON FOR CONSULTATION: KAHLEB MCCLANE is a 66 y.o. 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  Palliative ask to see for symptom management and goals of care.    SOCIAL HISTORY:     reports that he has been smoking cigarettes. He has a 53.00 pack-year smoking history. He has never used smokeless tobacco. He reports current alcohol use. He reports that he does not currently use drugs.  ADVANCE DIRECTIVES:  Patient reports his children would be his medical decision makers. Does have MOST on file. Personally reviewed an discussed with no requested changes.    CODE STATUS: DNR  PAST MEDICAL HISTORY: Past Medical History:  Diagnosis Date   Asthma    COPD (chronic obstructive pulmonary disease) (HCC)    GERD (gastroesophageal reflux disease)    Hypertension    Prostate cancer ([place])     PAST SURGICAL HISTORY:  Past Surgical History:  Procedure Laterality Date   ABDOMINAL AORTOGRAM W/LOWER EXTREMITY N/A 10/26/2020   Procedure: ABDOMINAL AORTOGRAM W/LOWER EXTREMITY;  Surgeon: Marty Heck, MD;  Location: [facility];  Service: Cardiovascular;  Laterality: N/A;   COLONOSCOPY WITH PROPOFOL N/A 05/22/2021   Procedure: COLONOSCOPY WITH PROPOFOL;  Surgeon: Doran Stabler, MD;  [facility];  Service: Gastroenterology;  Laterality: N/A;   ESOPHAGOGASTRODUODENOSCOPY (EGD) WITH PROPOFOL N/A 05/22/2021    Procedure: ESOPHAGOGASTRODUODENOSCOPY (EGD) WITH PROPOFOL;  Surgeon: Doran Stabler, MD;  [facility];  Service: Gastroenterology;  Laterality: N/A;   HEMOSTASIS CLIP PLACEMENT  05/22/2021   Procedure: HEMOSTASIS CLIP PLACEMENT;  Surgeon: Doran Stabler, MD;  [facility];  Service: Gastroenterology;;   HOT HEMOSTASIS N/A 05/22/2021   Procedure: HOT HEMOSTASIS (ARGON PLASMA COAGULATION/BICAP);  Surgeon: Doran Stabler, MD;  Location: [facility];  Service: Gastroenterology;  Laterality: N/A;   WRIST SURGERY Left 2017    HEMATOLOGY/ONCOLOGY HISTORY:  Oncology History   No history exists.    ALLERGIES:  is allergic to ace inhibitors and lisinopril.  MEDICATIONS:  Current Outpatient Medications  Medication Sig Dispense Refill   abiraterone acetate (ZYTIGA) 250 MG tablet TAKE 4 TABLETS BY MOUTH DAILY. TAKE ON AN EMPTY STOMACH 1 HOUR BEFORE OR 2 HOURS AFTER A MEAL 120 tablet 2   albuterol (PROVENTIL) (2.5 MG/3ML) 0.083% nebulizer solution Take 3 mLs (2.5 mg total) by nebulization every 6 (six) hours as needed for wheezing or shortness of breath. 180 mL 5   albuterol (VENTOLIN HFA) 108 (90 Base) MCG/ACT inhaler Inhale 2 puffs into the lungs every 6 (six) hours as needed for wheezing or shortness of breath. 6.7 g 5   amLODipine (NORVASC) 5 MG tablet Take 1 tablet (5 mg total) by mouth daily. 90 tablet 1   calcium-vitamin D (OSCAL WITH D) 500-200 MG-UNIT tablet Take 1 tablet by mouth daily with breakfast. 90 tablet 3   docusate sodium (COLACE) 100 MG capsule Take 200 mg by mouth daily.     finasteride (PROSCAR) 5 MG tablet Take 1 tablet (5  mg total) by mouth daily. 90 tablet 1   Fluticasone-Umeclidin-Vilant (TRELEGY ELLIPTA) 200-62.5-25 MCG/ACT AEPB Inhale 1 puff into the lungs daily. Use once daily 60 each 11   gabapentin (NEURONTIN) 300 MG capsule Take 2 capsules (600 mg total) by mouth 2 (two) times daily. 120 capsule 2   lidocaine (LIDODERM) 5 % Place 1 patch  onto the skin daily. Remove & Discard patch within 12 hours or as directed by MD 30 patch 0   nicotine (NICOTROL) 10 MG inhaler Inhale 1 continuous puffing into the lungs as needed for smoking cessation.     oxyCODONE (OXY IR/ROXICODONE) 5 MG immediate release tablet Take 1-2 tablets (5-10 mg total) by mouth every 4 (four) hours as needed for severe pain, breakthrough pain or moderate pain. 180 tablet 0   oxyCODONE ER (XTAMPZA ER) 36 MG C12A Take 1 capsule (36 mg total) by mouth every 8 (eight) hours. 60 capsule 0   pantoprazole (PROTONIX) 40 MG tablet Take 1 tablet (40 mg total) by mouth daily. 90 tablet 3   polyethylene glycol (MIRALAX) 17 g packet Mix 17 grams (1 packet) with 4-8 ounces of liquid and take by mouth 2 (two) times daily. 60 each 2   predniSONE (DELTASONE) 5 MG tablet Take 1 tablet (5 mg total) by mouth daily with breakfast. (do not take prior to starting Abiraterone) 90 tablet 1   sildenafil (VIAGRA) 100 MG tablet Take 1 tablet (100 mg total) by mouth daily as needed for erectile dysfunction. 30 tablet 0   sorbitol 70 % SOLN Take 15 mLs by mouth daily as needed. 473 mL 0   tamsulosin (FLOMAX) 0.4 MG CAPS capsule Take 1 capsule (0.4 mg total) by mouth in the morning and at bedtime. 180 capsule 0   traZODone (DESYREL) 100 MG tablet Take 0.5 tablets (50 mg total) by mouth at bedtime. 90 tablet 1   No current facility-administered medications for this visit.    VITAL SIGNS: BP 120/77 (BP Location: Right Arm, Patient Position: Sitting)   Pulse 78   Temp 97.8 F (36.6 C) (Tympanic)   Resp 15   Wt 144 lb 8 oz (65.5 kg)   SpO2 91%   BMI 20.15 kg/m  Filed Weights   07/18/21 1521  Weight: 144 lb 8 oz (65.5 kg)    Estimated body mass index is 20.15 kg/m as calculated from the following:   Height as of 06/27/21: '5\' 11"'$  (1.803 m).   Weight as of this encounter: 144 lb 8 oz (65.5 kg).   PERFORMANCE STATUS (ECOG) : 1 - Symptomatic but completely ambulatory  IMPRESSION: Mr.  Crossen and his daughter presented to clinic today for follow-up on symptom management. He is in a wheelchair. Continues to support himself most of the time in the home although endorses increased fatigue over the past week. States "I just can't be as active as I once was in my younger years"! Feels more fatigued with increase activity and walking increased distances. Appetite is good. Denies any nausea/vomiting.   1.  Neoplasm related pain Mr. Turnage reports pain is ongoing. He noticed some improvement when he initially started Xtampza however pain seems to now have increased over time. He feels pain intensifies around 8-9 hours although medication is not due. He is using his breakthrough medication which helps some. Reports some days he limits his activity and movement due to pain.   We reviewed at length his current pain regimen which consists of Xtampza 36 mg twice daily, Oxy IR  5-10 mg every 4 hours as needed for breakthrough pain, and gabapentin 600 mg twice daily. Per patient and daughter he is taking all medications as prescribed. Additional relief with use of lidocaine patches.   We discussed goal of effective pain management. We will plan to increase frequency of his Xtampza to every 8 hours versus increasing every 12 hour dosing in the setting of pain intensifying within 8 hours.   Does endorse some relief on Xtampza however does not feel that it is as effective as it was when he was taking OxyContin.  Understands due to insurance restrictions Ginger Organ is preferred medication.  We will plan to continue to monitor and make adjustments as needed.   2.  Constipation Jabbar does endorse ongoing constipation.  Education provided on increased risk due to opioid use and importance of strict bowel regimen to prevent constipation which could also contribute to escalation in his pain.    He reports he is taking Senna as requested. Also has sorbitol available. This is effective when he takes it.  Daughter expressed he does not always comply or waits until he has went several days without a bowel movement and then begins following regimen. I emphasized importance of bowel regimen given known concerns with constipation in addition to opioid use. Patient and daughter verbalized understanding and plans to begin following regimen. If constipation persist will consider use of Movantik or Linzess.    I discussed the importance of continued conversation with family and their medical providers regarding overall plan of care and treatment options, ensuring decisions are within the context of the patients values and GOCs.  PLAN:  Detailed discussion regarding current pain regimen and education provided.  We will continue to closely monitor and adjust as needed. Increase Xtampza 36 mg to every 6 hours.  5-10 mg Oxy IR every 4 hours as needed for breakthrough pain as prescribed Gabapentin 600 mg twice daily.  Education provided to patient to take 2 tablets (300 mg).  Has been taking as prescribed with noticeable decrease in pain. Tolerating well.  Lidocaine patch to lower back Miralax twice daily. Patient reports he has not been taking but is having constipation. Education provided. Will consider Movantik or Linzess if no improvement in bowel regimen/constipation.  Sorbitol as needed for constipation Senna-S twice daily  DNR/DNI as requested and confirmed by patient. MOST form completed on file.  Ongoing goals of care discussion.  I will plan to see patient back in 2-4 weeks in collaboration to other oncology appointments.    Patient expressed understanding and was in agreement with this plan. He also understands that He can call the clinic at any time with any questions, concerns, or complaints.   Thank you for your referral and allowing Palliative to assist in Mr. Jaysion Ramseyer Etzkorn's care.    Number and complexity of problems addressed: 3 HIGH - 1 or more chronic illnesses with SEVERE  exacerbation, progression, or side effects of treatment - advanced cancer, pain. Any controlled substances utilized were prescribed in the context of palliative care.   Time Total: 40 min.   Visit consisted of counseling and education dealing with the complex and emotionally intense issues of symptom management and palliative care in the setting of serious and potentially life-threatening illness.Greater than 50%  of this time was spent counseling and coordinating care related to the above assessment and plan.  Alda Lea, AGPCNP-BC  Palliative Medicine Team/[place] [facility]

## 2021-07-19 ENCOUNTER — Telehealth: Payer: Self-pay | Admitting: Hematology and Oncology

## 2021-07-19 LAB — PROSTATE-SPECIFIC AG, SERUM (LABCORP): Prostate Specific Ag, Serum: 0.8 ng/mL (ref 0.0–4.0)

## 2021-07-19 LAB — TESTOSTERONE: Testosterone: <3 ng/dL — ABNORMAL LOW (ref 264–916)

## 2021-07-19 NOTE — Telephone Encounter (Signed)
Scheduled per 6/8 los, pt daughter has confirmed appt

## 2021-07-23 ENCOUNTER — Other Ambulatory Visit (HOSPITAL_COMMUNITY): Payer: Self-pay

## 2021-07-24 ENCOUNTER — Encounter: Payer: Self-pay | Admitting: Hematology and Oncology

## 2021-07-24 ENCOUNTER — Other Ambulatory Visit (HOSPITAL_COMMUNITY): Payer: Self-pay

## 2021-07-25 ENCOUNTER — Inpatient Hospital Stay: Payer: Medicare Other

## 2021-07-25 ENCOUNTER — Other Ambulatory Visit: Payer: Self-pay

## 2021-07-25 ENCOUNTER — Inpatient Hospital Stay (HOSPITAL_BASED_OUTPATIENT_CLINIC_OR_DEPARTMENT_OTHER): Payer: Medicare Other | Admitting: Physician Assistant

## 2021-07-25 VITALS — BP 117/69 | HR 89 | Temp 98.0°F | Resp 16

## 2021-07-25 DIAGNOSIS — T801XXA Vascular complications following infusion, transfusion and therapeutic injection, initial encounter: Secondary | ICD-10-CM | POA: Diagnosis not present

## 2021-07-25 DIAGNOSIS — C61 Malignant neoplasm of prostate: Secondary | ICD-10-CM

## 2021-07-25 MED ORDER — LEUPROLIDE ACETATE (3 MONTH) 22.5 MG ~~LOC~~ KIT
22.5000 mg | PACK | Freq: Once | SUBCUTANEOUS | Status: AC
  Administered 2021-07-25: 22.5 mg via SUBCUTANEOUS
  Filled 2021-07-25: qty 22.5

## 2021-07-25 MED ORDER — SODIUM CHLORIDE 0.9 % IV SOLN: INTRAVENOUS | Status: DC

## 2021-07-25 MED ORDER — ZOLEDRONIC ACID 4 MG/100ML IV SOLN
4.0000 mg | Freq: Once | INTRAVENOUS | Status: AC
  Administered 2021-07-25: 4 mg via INTRAVENOUS
  Filled 2021-07-25: qty 100

## 2021-07-25 NOTE — Progress Notes (Signed)
Patient completed Zometa infusion and was switched to a flush. During the flush patient patient noticed swelling to the IV site. IV was stopped and [place], [place] was called. Patient reported no pain until being assessed by PA. RN attempted to aspirate fluids with no return. Ice was applied per [place], [place]. Patient will apply ice again this evening and will monitor site. Patient understands to return to clinic tomorrow for follow up.

## 2021-07-25 NOTE — Patient Instructions (Signed)
Leuprolide depot injection What is this medication? LEUPROLIDE (loo PROE lide) is a man-made protein that acts like a natural hormone in the body. It decreases testosterone in men and decreases estrogen in women. In men, this medicine is used to treat advanced prostate cancer. In women, some forms of this medicine may be used to treat endometriosis, uterine fibroids, or other male hormone-related problems. This medicine may be used for other purposes; ask your health care provider or pharmacist if you have questions. COMMON BRAND NAME(S): Eligard, Fensolv, Lupron Depot, Lupron Depot-Ped, Viadur What should I tell my care team before I take this medication? They need to know if you have any of these conditions: diabetes heart disease or previous heart attack high blood pressure high cholesterol mental illness osteoporosis pain or difficulty passing urine seizures spinal cord metastasis stroke suicidal thoughts, plans, or attempt; a previous suicide attempt by you or a family member tobacco smoker unusual vaginal bleeding (women) an unusual or allergic reaction to leuprolide, benzyl alcohol, other medicines, foods, dyes, or preservatives pregnant or trying to get pregnant breast-feeding How should I use this medication? This medicine is for injection into a muscle or for injection under the skin. It is given by a health care professional in a hospital or clinic setting. The specific product will determine how it will be given to you. Make sure you understand which product you receive and how often you will receive it. Talk to your pediatrician regarding the use of this medicine in children. Special care may be needed. Overdosage: If you think you have taken too much of this medicine contact a poison control center or emergency room at once. NOTE: This medicine is only for you. Do not share this medicine with others. What if I miss a dose? It is important not to miss a dose. Call your  doctor or health care professional if you are unable to keep an appointment. Depot injections: Depot injections are given either once-monthly, every 12 weeks, every 16 weeks, or every 24 weeks depending on the product you are prescribed. The product you are prescribed will be based on if you are male or male, and your condition. Make sure you understand your product and dosing. What may interact with this medication? Do not take this medicine with any of the following medications: chasteberry cisapride dronedarone pimozide thioridazine This medicine may also interact with the following medications: herbal or dietary supplements, like black cohosh or DHEA male hormones, like estrogens or progestins and birth control pills, patches, rings, or injections male hormones, like testosterone other medicines that prolong the QT interval (abnormal heart rhythm) This list may not describe all possible interactions. Give your health care provider a list of all the medicines, herbs, non-prescription drugs, or dietary supplements you use. Also tell them if you smoke, drink alcohol, or use illegal drugs. Some items may interact with your medicine. What should I watch for while using this medication? Visit your doctor or health care professional for regular checks on your progress. During the first weeks of treatment, your symptoms may get worse, but then will improve as you continue your treatment. You may get hot flashes, increased bone pain, increased difficulty passing urine, or an aggravation of nerve symptoms. Discuss these effects with your doctor or health care professional, some of them may improve with continued use of this medicine. Male patients may experience a menstrual cycle or spotting during the first months of therapy with this medicine. If this continues, contact your doctor or  health care professional. This medicine may increase blood sugar. Ask your healthcare provider if changes in diet  or medicines are needed if you have diabetes. What side effects may I notice from receiving this medication? Side effects that you should report to your doctor or health care professional as soon as possible: allergic reactions like skin rash, itching or hives, swelling of the face, lips, or tongue breathing problems chest pain depression or memory disorders pain in your legs or groin pain at site where injected or implanted seizures severe headache signs and symptoms of high blood sugar such as being more thirsty or hungry or having to urinate more than normal. You may also feel very tired or have blurry vision swelling of the feet and legs suicidal thoughts or other mood changes visual changes vomiting Side effects that usually do not require medical attention (report to your doctor or health care professional if they continue or are bothersome): breast swelling or tenderness decrease in sex drive or performance diarrhea hot flashes loss of appetite muscle, joint, or bone pains nausea redness or irritation at site where injected or implanted skin problems or acne This list may not describe all possible side effects. Call your doctor for medical advice about side effects. You may report side effects to FDA at 1-800-FDA-1088. Where should I keep my medication? This drug is given in a hospital or clinic and will not be stored at home. NOTE: This sheet is a summary. It may not cover all possible information. If you have questions about this medicine, talk to your doctor, pharmacist, or health care provider.  2022 Elsevier/Gold Standard (2020-10-17 00:00:00)   Zoledronic Acid Injection (Hypercalcemia, Oncology) What is this medication? ZOLEDRONIC ACID (ZOE le dron ik AS id) slows calcium loss from bones. It high calcium levels in the blood from some kinds of cancer. It may be used in other people at risk for bone loss. This medicine may be used for other purposes; ask your health  care provider or pharmacist if you have questions. COMMON BRAND NAME(S): Zometa What should I tell my care team before I take this medication? They need to know if you have any of these conditions: cancer dehydration dental disease kidney disease liver disease low levels of calcium in the blood lung or breathing disease (asthma) receiving steroids like dexamethasone or prednisone an unusual or allergic reaction to zoledronic acid, other medicines, foods, dyes, or preservatives pregnant or trying to get pregnant breast-feeding How should I use this medication? This drug is injected into a vein. It is given by a health care provider in a hospital or clinic setting. Talk to your health care provider about the use of this drug in children. Special care may be needed. Overdosage: If you think you have taken too much of this medicine contact a poison control center or emergency room at once. NOTE: This medicine is only for you. Do not share this medicine with others. What if I miss a dose? Keep appointments for follow-up doses. It is important not to miss your dose. Call your health care provider if you are unable to keep an appointment. What may interact with this medication? certain antibiotics given by injection NSAIDs, medicines for pain and inflammation, like ibuprofen or naproxen some diuretics like bumetanide, furosemide teriparatide thalidomide This list may not describe all possible interactions. Give your health care provider a list of all the medicines, herbs, non-prescription drugs, or dietary supplements you use. Also tell them if you smoke, drink alcohol, or  use illegal drugs. Some items may interact with your medicine. What should I watch for while using this medication? Visit your health care provider for regular checks on your progress. It may be some time before you see the benefit from this drug. Some people who take this drug have severe bone, joint, or muscle pain. This  drug may also increase your risk for jaw problems or a broken thigh bone. Tell your health care provider right away if you have severe pain in your jaw, bones, joints, or muscles. Tell you health care provider if you have any pain that does not go away or that gets worse. Tell your dentist and dental surgeon that you are taking this drug. You should not have major dental surgery while on this drug. See your dentist to have a dental exam and fix any dental problems before starting this drug. Take good care of your teeth while on this drug. Make sure you see your dentist for regular follow-up appointments. You should make sure you get enough calcium and vitamin D while you are taking this drug. Discuss the foods you eat and the vitamins you take with your health care provider. Check with your health care provider if you have severe diarrhea, nausea, and vomiting, or if you sweat a lot. The loss of too much body fluid may make it dangerous for you to take this drug. You may need blood work done while you are taking this drug. Do not become pregnant while taking this drug. Women should inform their health care provider if they wish to become pregnant or think they might be pregnant. There is potential for serious harm to an unborn child. Talk to your health care provider for more information. What side effects may I notice from receiving this medication? Side effects that you should report to your doctor or health care provider as soon as possible: allergic reactions (skin rash, itching or hives; swelling of the face, lips, or tongue) bone pain infection (fever, chills, cough, sore throat, pain or trouble passing urine) jaw pain, especially after dental work joint pain kidney injury (trouble passing urine or change in the amount of urine) low blood pressure (dizziness; feeling faint or lightheaded, falls; unusually weak or tired) low calcium levels (fast heartbeat; muscle cramps or pain; pain, tingling,  or numbness in the hands or feet; seizures) low magnesium levels (fast, irregular heartbeat; muscle cramp or pain; muscle weakness; tremors; seizures) low red blood cell counts (trouble breathing; feeling faint; lightheaded, falls; unusually weak or tired) muscle pain redness, blistering, peeling, or loosening of the skin, including inside the mouth severe diarrhea swelling of the ankles, feet, hands trouble breathing Side effects that usually do not require medical attention (report to your doctor or health care provider if they continue or are bothersome): anxious constipation coughing depressed mood eye irritation, itching, or pain fever general ill feeling or flu-like symptoms nausea pain, redness, or irritation at site where injected trouble sleeping This list may not describe all possible side effects. Call your doctor for medical advice about side effects. You may report side effects to FDA at 1-800-FDA-1088. Where should I keep my medication? This drug is given in a hospital or clinic. It will not be stored at home. NOTE: This sheet is a summary. It may not cover all possible information. If you have questions about this medicine, talk to your doctor, pharmacist, or health care provider.  2022 Elsevier/Gold Standard (2020-10-17 00:00:00)

## 2021-07-25 NOTE — Progress Notes (Signed)
Symptom Management Consult note [place]    Patient Care Team: [name], MD as PCP - General (Internal Medicine) Cira Rue, RN Nurse Navigator as Registered Nurse (Medical Oncology) Debera Lat, Carlena Sax, NP as Nurse Practitioner (Nurse Practitioner)    Name of the patient: Dustin Alvarez  [id number]  04-13-1955   Date of visit: 07/25/2021    Chief complaint/ Reason for visit- extravasation  Oncology History   No history exists.    Current Therapy: Zoledronic Acid  and Leuprolide Acetate q3 months  Interval history- Dustin Alvarez is a 66 y.o. 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  Patient had finished Zometa infusion and was receiving IV fluid flush when he noticed swelling on his right forearm.  Patient reports 7 out of 10 pain localized to the forearm that he describes as aching. Pain is intermittent and does not radiate.  RN was unable to aspirate any fluid from the IV.  IV was removed and ice was applied.  No medications given for pain.  Patient denies any fever, chills, numbness or tingling.    ROS  All other systems are reviewed and are negative for acute change except as noted in the HPI.    Allergies  Allergen Reactions   Ace Inhibitors Swelling    lisinopril   Lisinopril Swelling     Past Medical History:  Diagnosis Date   Asthma    COPD (chronic obstructive pulmonary disease) (HCC)    GERD (gastroesophageal reflux disease)    Hypertension    Prostate cancer [facility])      Past Surgical History:  Procedure Laterality Date   ABDOMINAL AORTOGRAM W/LOWER EXTREMITY N/A 10/26/2020   Procedure: ABDOMINAL AORTOGRAM W/LOWER EXTREMITY;  Surgeon: Marty Heck, MD;  Location: [facility];  Service: Cardiovascular;  Laterality: N/A;   COLONOSCOPY WITH PROPOFOL N/A 05/22/2021   Procedure: COLONOSCOPY WITH PROPOFOL;   Surgeon: Doran Stabler, MD;  Location: [facility];  Service: Gastroenterology;  Laterality: N/A;   ESOPHAGOGASTRODUODENOSCOPY (EGD) WITH PROPOFOL N/A 05/22/2021   Procedure: ESOPHAGOGASTRODUODENOSCOPY (EGD) WITH PROPOFOL;  Surgeon: Doran Stabler, MD;  Location: [facility];  Service: Gastroenterology;  Laterality: N/A;   HEMOSTASIS CLIP PLACEMENT  05/22/2021   Procedure: HEMOSTASIS CLIP PLACEMENT;  Surgeon: Doran Stabler, MD;  Location: [facility];  Service: Gastroenterology;;   HOT HEMOSTASIS N/A 05/22/2021   Procedure: HOT HEMOSTASIS (ARGON PLASMA COAGULATION/BICAP);  Surgeon: Doran Stabler, MD;  Location: [facility];  Service: Gastroenterology;  Laterality: N/A;   WRIST SURGERY Left 2017    Social History   Socioeconomic History   Marital status: Widowed    Spouse name: Not on file   Number of children: Not on file   Years of education: Not on file   Highest education level: Not on file  Occupational History   Not on file  Tobacco Use   Smoking status: Some Days    Packs/day: 1.00    Years: 53.00    Total pack years: 53.00    Types: Cigarettes   Smokeless tobacco: Never   Tobacco comments:    0.5 PPD/Wants patches   Vaping Use   Vaping Use: Never used  Substance and Sexual Activity   Alcohol use: Yes    Comment: 1-2 drinks per week.   Drug use: Not Currently   Sexual activity: Not Currently  Other Topics Concern   Not  on file  Social History Narrative   Not on file   Social Determinants of Health   Financial Resource Strain: Not on file  Food Insecurity: Not on file  Transportation Needs: Not on file  Physical Activity: Not on file  Stress: Not on file  Social Connections: Not on file  Intimate Partner Violence: Not on file    Family History  Problem Relation Age of Onset   COPD Father    Diabetes Sister    Diabetes Brother    Breast cancer Neg Hx    Prostate cancer Neg Hx    Colon cancer Neg Hx    Pancreatic cancer Neg Hx     Stomach cancer Neg Hx    Esophageal cancer Neg Hx      Current Outpatient Medications:    abiraterone acetate (ZYTIGA) 250 MG tablet, TAKE 4 TABLETS BY MOUTH DAILY. TAKE ON AN EMPTY STOMACH 1 HOUR BEFORE OR 2 HOURS AFTER A MEAL, Disp: 120 tablet, Rfl: 2   albuterol (PROVENTIL) (2.5 MG/3ML) 0.083% nebulizer solution, Take 3 mLs (2.5 mg total) by nebulization every 6 (six) hours as needed for wheezing or shortness of breath., Disp: 180 mL, Rfl: 5   albuterol (VENTOLIN HFA) 108 (90 Base) MCG/ACT inhaler, Inhale 2 puffs into the lungs every 6 (six) hours as needed for wheezing or shortness of breath., Disp: 6.7 g, Rfl: 5   amLODipine (NORVASC) 5 MG tablet, Take 1 tablet (5 mg total) by mouth daily., Disp: 90 tablet, Rfl: 1   calcium-vitamin D (OSCAL WITH D) 500-200 MG-UNIT tablet, Take 1 tablet by mouth daily with breakfast., Disp: 90 tablet, Rfl: 3   docusate sodium (COLACE) 100 MG capsule, Take 200 mg by mouth daily., Disp: , Rfl:    finasteride (PROSCAR) 5 MG tablet, Take 1 tablet (5 mg total) by mouth daily., Disp: 90 tablet, Rfl: 1   Fluticasone-Umeclidin-Vilant (TRELEGY ELLIPTA) 200-62.5-25 MCG/ACT AEPB, Inhale 1 puff into the lungs daily. Use once daily, Disp: 60 each, Rfl: 11   gabapentin (NEURONTIN) 300 MG capsule, Take 2 capsules (600 mg total) by mouth 2 (two) times daily., Disp: 120 capsule, Rfl: 2   lidocaine (LIDODERM) 5 %, Place 1 patch onto the skin daily. Remove & Discard patch within 12 hours or as directed by MD, Disp: 30 patch, Rfl: 0   nicotine (NICOTROL) 10 MG inhaler, Inhale 1 continuous puffing into the lungs as needed for smoking cessation., Disp: , Rfl:    oxyCODONE (OXY IR/ROXICODONE) 5 MG immediate release tablet, Take 1-2 tablets (5-10 mg total) by mouth every 4 (four) hours as needed for severe pain, breakthrough pain or moderate pain., Disp: 180 tablet, Rfl: 0   oxyCODONE ER (XTAMPZA ER) 36 MG C12A, Take 1 capsule (36 mg total) by mouth every 8 (eight) hours., Disp: 60  capsule, Rfl: 0   pantoprazole (PROTONIX) 40 MG tablet, Take 1 tablet (40 mg total) by mouth daily., Disp: 90 tablet, Rfl: 3   polyethylene glycol (MIRALAX) 17 g packet, Mix 17 grams (1 packet) with 4-8 ounces of liquid and take by mouth 2 (two) times daily., Disp: 60 each, Rfl: 2   predniSONE (DELTASONE) 5 MG tablet, Take 1 tablet (5 mg total) by mouth daily with breakfast. (do not take prior to starting Abiraterone), Disp: 90 tablet, Rfl: 1   sildenafil (VIAGRA) 100 MG tablet, Take 1 tablet (100 mg total) by mouth daily as needed for erectile dysfunction., Disp: 30 tablet, Rfl: 0   sorbitol 70 % SOLN, Take  15 mLs by mouth daily as needed., Disp: 473 mL, Rfl: 0   tamsulosin (FLOMAX) 0.4 MG CAPS capsule, Take 1 capsule (0.4 mg total) by mouth in the morning and at bedtime., Disp: 180 capsule, Rfl: 0   traZODone (DESYREL) 100 MG tablet, Take 0.5 tablets (50 mg total) by mouth at bedtime., Disp: 90 tablet, Rfl: 1  PHYSICAL EXAM: ECOG FS:1 - Symptomatic but completely ambulatory   T: 98 BP: 117/69  HR: 89   Resp: 16  O2: 94% Physical Exam Vitals and nursing note reviewed.  Constitutional:      Appearance: He is well-developed. He is not ill-appearing or toxic-appearing.  HENT:     Head: Normocephalic and atraumatic.     Nose: Nose normal.  Eyes:     General: No scleral icterus.       Right eye: No discharge.        Left eye: No discharge.     Conjunctiva/sclera: Conjunctivae normal.  Neck:     Vascular: No JVD.  Cardiovascular:     Rate and Rhythm: Normal rate and regular rhythm.     Pulses: Normal pulses.          Radial pulses are 2+ on the right side and 2+ on the left side.     Heart sounds: Normal heart sounds.  Pulmonary:     Effort: Pulmonary effort is normal.     Breath sounds: Normal breath sounds.  Abdominal:     General: There is no distension.  Musculoskeletal:        General: Normal range of motion.     Cervical back: Normal range of motion.     Comments: 1 x 4 cm  oblong area of inflammation on right forearm with tenderness to palpation. Area is not warm to the touch. No skin color changes or erythema noted. Full ROM of RUE.  Skin:    General: Skin is warm and dry.     Comments: Equal tactile temperature in all extremities.  Neurological:     Mental Status: He is oriented to person, place, and time.     GCS: GCS eye subscore is 4. GCS verbal subscore is 5. GCS motor subscore is 6.     Comments: Fluent speech, no facial droop.  Psychiatric:        Behavior: Behavior normal.        LABORATORY DATA: I have reviewed the data as listed    Latest Ref Rng & Units 07/18/2021    2:52 PM 05/22/2021    9:55 AM 05/17/2021   12:58 PM  CBC  WBC 4.0 - 10.5 K/uL 4.3  6.3  5.9   Hemoglobin 13.0 - 17.0 g/dL 12.2  9.8  7.9 Repeated and verified X2.   Hematocrit 39.0 - 52.0 % 38.9  33.4  26.7   Platelets 150 - 400 K/uL 293  443  320.0         Latest Ref Rng & Units 07/18/2021    2:52 PM 05/15/2021    3:19 PM 05/02/2021    9:15 AM  CMP  Glucose 70 - 99 mg/dL 109  89  97   BUN 8 - 23 mg/dL '12  11  10   '$ Creatinine 0.61 - 1.24 mg/dL 0.79  0.68  0.80   Sodium 135 - 145 mmol/L 142  137  138   Potassium 3.5 - 5.1 mmol/L 3.9  4.2  3.3   Chloride 98 - 111 mmol/L 105  98  101  CO2 22 - 32 mmol/L '30  25  30   '$ Calcium 8.9 - 10.3 mg/dL 9.4  9.8  9.0   Total Protein 6.5 - 8.1 g/dL 7.9   7.3   Total Bilirubin 0.3 - 1.2 mg/dL 0.3   0.6   Alkaline Phos 38 - 126 U/L 84   90   AST 15 - 41 U/L 16   8   ALT 0 - 44 U/L 10   8        RADIOGRAPHIC STUDIES: I have personally reviewed the radiological images as listed and agreed with the findings in the report. No images are attached to the encounter. No results found.   ASSESSMENT & PLAN: Patient is a 66 y.o. male  with oncologic history of metastatic castrate sensitive prostate cancer  followed by Dr. Lorenso Courier.  I have viewed most recent oncology note and lab work.   #)?Extravasation vs IVF infiltration-swelling  started while patient receiving IV fluid flush.  Felt this is likely to be IV infiltration however given he had just had Zometa will have patient return to clinic in clinic.  His right upper extremity is neurovascularly intact distally.  Ice was applied.  Discussed home care.  Patient agreeable with plan to return to clinic tomorrow for recheck.  Oncologist made aware and is agreeable with plan of care.  Visit Diagnosis: 1. IV infiltration, initial encounter      No orders of the defined types were placed in this encounter.   All questions were answered. The patient knows to call the clinic with any problems, questions or concerns. No barriers to learning was detected.  I have spent a total of 10 minutes minutes of face-to-face and non-face-to-face time, preparing to see the patient, obtaining and/or reviewing separately obtained history, performing a medically appropriate examination, counseling and educating the patient,  documenting clinical information in the electronic health record, and care coordination.     Thank you for allowing me to participate in the care of this patient.    Barrie Folk, PA-C Department of Hematology/Oncology [facility] at [facility] Phone: [phone]  [phone]    07/25/2021 5:07 PM

## 2021-07-26 ENCOUNTER — Encounter: Payer: Medicare Other | Admitting: Physician Assistant

## 2021-07-26 ENCOUNTER — Encounter: Payer: Medicare Other | Admitting: Nurse Practitioner

## 2021-07-27 ENCOUNTER — Other Ambulatory Visit (HOSPITAL_COMMUNITY): Payer: Self-pay

## 2021-07-27 ENCOUNTER — Encounter: Payer: Self-pay | Admitting: Nurse Practitioner

## 2021-07-27 ENCOUNTER — Other Ambulatory Visit: Payer: Self-pay

## 2021-07-27 ENCOUNTER — Inpatient Hospital Stay (HOSPITAL_BASED_OUTPATIENT_CLINIC_OR_DEPARTMENT_OTHER): Payer: Medicare Other | Admitting: Nurse Practitioner

## 2021-07-27 ENCOUNTER — Telehealth: Payer: Medicare Other | Admitting: Nurse Practitioner

## 2021-07-27 DIAGNOSIS — Z515 Encounter for palliative care: Secondary | ICD-10-CM | POA: Diagnosis not present

## 2021-07-27 DIAGNOSIS — C7951 Secondary malignant neoplasm of bone: Secondary | ICD-10-CM

## 2021-07-27 DIAGNOSIS — K5903 Drug induced constipation: Secondary | ICD-10-CM | POA: Diagnosis not present

## 2021-07-27 DIAGNOSIS — G893 Neoplasm related pain (acute) (chronic): Secondary | ICD-10-CM

## 2021-07-27 DIAGNOSIS — R630 Anorexia: Secondary | ICD-10-CM

## 2021-07-27 DIAGNOSIS — C61 Malignant neoplasm of prostate: Secondary | ICD-10-CM

## 2021-07-27 MED ORDER — XTAMPZA ER 36 MG PO C12A: 36.0000 mg | EXTENDED_RELEASE_CAPSULE | Freq: Three times a day (TID) | ORAL | 0 refills | Status: DC

## 2021-07-27 NOTE — Progress Notes (Signed)
[place]  Telephone:([phone]) [phone] Fax:(336) [phone]   Name: Dustin Alvarez Date: 07/27/2021 MRN: [id number]  DOB: [birth year]  Patient Care Team: [name], MD as PCP - General (Internal Medicine) Cira Rue, RN Nurse Navigator as Registered Nurse (Medical Oncology) Pickenpack-Cousar, Carlena Sax, NP as Nurse Practitioner (Nurse Practitioner)    I connected with Dustin Alvarez on 07/27/21 at 10:30 AM EDT by phone and verified that I am speaking with the correct person using two identifiers.   I discussed the limitations, risks, security and privacy concerns of performing an evaluation and management service by telemedicine and the availability of in-person appointments. I also discussed with the patient that there may be a patient responsible charge related to this service. The patient expressed understanding and agreed to proceed.   Other persons participating in the visit and their role in the encounter: [name] (daughter), Maygan, RN    Patient's location: Home   Provider's location: [place]: Dustin Alvarez is a 66 y.o. 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  Palliative ask to see for symptom management and goals of care.    SOCIAL HISTORY:     reports that he has been smoking cigarettes. He has a 53.00 pack-year smoking history. He has never used smokeless tobacco. He reports current alcohol use. He reports that he does not currently use drugs.  ADVANCE DIRECTIVES:  Patient reports his children would be his medical decision makers. Does have MOST on file. Personally reviewed an discussed with no requested changes.    CODE STATUS: DNR  PAST MEDICAL HISTORY: Past Medical History:  Diagnosis Date   Asthma    COPD  (chronic obstructive pulmonary disease) (HCC)    GERD (gastroesophageal reflux disease)    Hypertension    Prostate cancer ([place])     PAST SURGICAL HISTORY:  Past Surgical History:  Procedure Laterality Date   ABDOMINAL AORTOGRAM W/LOWER EXTREMITY N/A 10/26/2020   Procedure: ABDOMINAL AORTOGRAM W/LOWER EXTREMITY;  Surgeon: Marty Heck, MD;  Location: [place] [facility];  Service: Cardiovascular;  Laterality: N/A;   COLONOSCOPY WITH PROPOFOL N/A 05/22/2021   Procedure: COLONOSCOPY WITH PROPOFOL;  Surgeon: Doran Stabler, MD;  [facility];  Service: Gastroenterology;  Laterality: N/A;   ESOPHAGOGASTRODUODENOSCOPY (EGD) WITH PROPOFOL N/A 05/22/2021   Procedure: ESOPHAGOGASTRODUODENOSCOPY (EGD) WITH PROPOFOL;  Surgeon: Doran Stabler, MD;  Location: [facility];  Service: Gastroenterology;  Laterality: N/A;   HEMOSTASIS CLIP PLACEMENT  05/22/2021   Procedure: HEMOSTASIS CLIP PLACEMENT;  Surgeon: Doran Stabler, MD;  Location: [facility];  Service: Gastroenterology;;   HOT HEMOSTASIS N/A 05/22/2021   Procedure: HOT HEMOSTASIS (ARGON PLASMA COAGULATION/BICAP);  Surgeon: Doran Stabler, MD;  Location: [facility];  Service: Gastroenterology;  Laterality: N/A;   WRIST SURGERY Left 2017    HEMATOLOGY/ONCOLOGY HISTORY:  Oncology History   No history exists.    ALLERGIES:  is allergic to ace inhibitors and lisinopril.  MEDICATIONS:  Current Outpatient Medications  Medication Sig Dispense Refill   abiraterone acetate (ZYTIGA) 250 MG tablet TAKE 4 TABLETS BY MOUTH DAILY. TAKE ON AN EMPTY STOMACH 1 HOUR BEFORE OR 2 HOURS AFTER A MEAL 120 tablet 2   albuterol (PROVENTIL) (2.5 MG/3ML) 0.083% nebulizer solution Take 3 mLs (2.5 mg total) by nebulization  every 6 (six) hours as needed for wheezing or shortness of breath. 180 mL 5   albuterol (VENTOLIN HFA) 108 (90 Base) MCG/ACT inhaler Inhale 2 puffs into the lungs every 6 (six) hours as needed for wheezing or  shortness of breath. 6.7 g 5   amLODipine (NORVASC) 5 MG tablet Take 1 tablet (5 mg total) by mouth daily. 90 tablet 1   calcium-vitamin D (OSCAL WITH D) 500-200 MG-UNIT tablet Take 1 tablet by mouth daily with breakfast. 90 tablet 3   docusate sodium (COLACE) 100 MG capsule Take 200 mg by mouth daily.     finasteride (PROSCAR) 5 MG tablet Take 1 tablet (5 mg total) by mouth daily. 90 tablet 1   Fluticasone-Umeclidin-Vilant (TRELEGY ELLIPTA) 200-62.5-25 MCG/ACT AEPB Inhale 1 puff into the lungs daily. Use once daily 60 each 11   gabapentin (NEURONTIN) 300 MG capsule Take 2 capsules (600 mg total) by mouth 2 (two) times daily. 120 capsule 2   lidocaine (LIDODERM) 5 % Place 1 patch onto the skin daily. Remove & Discard patch within 12 hours or as directed by MD 30 patch 0   nicotine (NICOTROL) 10 MG inhaler Inhale 1 continuous puffing into the lungs as needed for smoking cessation.     oxyCODONE (OXY IR/ROXICODONE) 5 MG immediate release tablet Take 1-2 tablets (5-10 mg total) by mouth every 4 (four) hours as needed for severe pain, breakthrough pain or moderate pain. 180 tablet 0   oxyCODONE ER (XTAMPZA ER) 36 MG C12A Take 1 capsule (36 mg total) by mouth every 8 (eight) hours. 60 capsule 0   pantoprazole (PROTONIX) 40 MG tablet Take 1 tablet (40 mg total) by mouth daily. 90 tablet 3   polyethylene glycol (MIRALAX) 17 g packet Mix 17 grams (1 packet) with 4-8 ounces of liquid and take by mouth 2 (two) times daily. 60 each 2   predniSONE (DELTASONE) 5 MG tablet Take 1 tablet (5 mg total) by mouth daily with breakfast. (do not take prior to starting Abiraterone) 90 tablet 1   sildenafil (VIAGRA) 100 MG tablet Take 1 tablet (100 mg total) by mouth daily as needed for erectile dysfunction. 30 tablet 0   sorbitol 70 % SOLN Take 15 mLs by mouth daily as needed. 473 mL 0   tamsulosin (FLOMAX) 0.4 MG CAPS capsule Take 1 capsule (0.4 mg total) by mouth in the morning and at bedtime. 180 capsule 0   traZODone  (DESYREL) 100 MG tablet Take 0.5 tablets (50 mg total) by mouth at bedtime. 90 tablet 1   No current facility-administered medications for this visit.    VITAL SIGNS: There were no vitals taken for this visit. There were no vitals filed for this visit.   Estimated body mass index is 20.15 kg/m as calculated from the following:   Height as of 06/27/21: '5\' 11"'$  (1.803 m).   Weight as of 07/18/21: 144 lb 8 oz (65.5 kg).   PERFORMANCE STATUS (ECOG) : 1 - Symptomatic but completely ambulatory  IMPRESSION: I connected with Mr. Meddaugh and his daughter for follow-up on symptom management. No acute distress noted. Continues to endorse ongoing fatigue.   1.  Neoplasm related pain Mr. Camino feels some improvement in his pain. Is tolerating current regimen and recent change of the Xtampza to every 8 hours.   We reviewed at length his current pain regimen which consists of Xtampza 36 mg every 8 hours, Oxy IR 5-10 mg every 4 hours as needed for breakthrough pain, and gabapentin  600 mg twice daily. Per patient and daughter he is taking all medications as prescribed. Additional relief with use of lidocaine patches.   We will plan to continue to monitor and make adjustments as needed.   2.  Constipation Kohen does endorse ongoing constipation.  Education provided on increased risk due to opioid use and importance of strict bowel regimen to prevent constipation which could also contribute to escalation in his pain.    He reports he is taking Senna as requested. Also has sorbitol available. This is effective when he takes it. Daughter expressed he does not always comply or waits until he has went several days without a bowel movement and then begins following regimen. I emphasized importance of bowel regimen given known concerns with constipation in addition to opioid use. Patient and daughter verbalized understanding and plans to begin following regimen. If constipation persist will consider use of  Movantik or Linzess.    I discussed the importance of continued conversation with family and their medical providers regarding overall plan of care and treatment options, ensuring decisions are within the context of the patients values and GOCs.  PLAN:  Detailed discussion regarding current pain regimen and education provided.  We will continue to closely monitor and adjust as needed.  Xtampza 36 mg to every 8 hours. Tolerated well. 5-10 mg Oxy IR every 4 hours as needed for breakthrough pain as prescribed Gabapentin 600 mg twice daily.  Education provided to patient to take 2 tablets (300 mg).  Has been taking as prescribed with noticeable decrease in pain. Tolerating well.  Lidocaine patch to lower back Miralax twice daily. Patient reports he has not been taking but is having constipation. Education provided. Will consider Movantik or Linzess if no improvement in bowel regimen/constipation.  Sorbitol as needed for constipation Senna-S twice daily  DNR/DNI as requested and confirmed by patient. MOST form completed on file.  Ongoing goals of care discussion.  I will plan to see patient back in 2-4 weeks in collaboration to other oncology appointments.    Patient expressed understanding and was in agreement with this plan. He also understands that He can call the clinic at any time with any questions, concerns, or complaints.   Thank you for your referral and allowing Palliative to assist in Mr. Quandarius Nill Messman's care.    Billing based on MDM: High   Problems Addressed: One acute or chronic illness or injury with exacerbation, progression, cancer, and pain.    Amount and/or Complexity of Data: Category 3:Discussion of management or test interpretation with external physician/other qualified health care professional/appropriate source (not separately reported). Any controlled substances utilized were prescribed in the context of palliative care.   Risks: Parenteral controlled substances.    Time Total: 25 min  Visit consisted of counseling and education dealing with the complex and emotionally intense issues of symptom management and palliative care in the setting of serious and potentially life-threatening illness.Greater than 50%  of this time was spent counseling and coordinating care related to the above assessment and plan.  Alda Lea, AGPCNP-BC  Palliative Medicine Team/[address] [place]

## 2021-07-31 ENCOUNTER — Other Ambulatory Visit: Payer: Self-pay | Admitting: *Deleted

## 2021-07-31 DIAGNOSIS — I739 Peripheral vascular disease, unspecified: Secondary | ICD-10-CM

## 2021-08-01 ENCOUNTER — Ambulatory Visit (INDEPENDENT_AMBULATORY_CARE_PROVIDER_SITE_OTHER): Payer: Medicare Other

## 2021-08-01 ENCOUNTER — Other Ambulatory Visit: Payer: Self-pay | Admitting: Nurse Practitioner

## 2021-08-01 ENCOUNTER — Other Ambulatory Visit (HOSPITAL_COMMUNITY): Payer: Self-pay

## 2021-08-01 ENCOUNTER — Ambulatory Visit (HOSPITAL_COMMUNITY)
Admission: EM | Admit: 2021-08-01 | Discharge: 2021-08-01 | Disposition: A | Discharge status: Home / Self Care | Payer: Medicare Other | Attending: Family Medicine | Admitting: Family Medicine

## 2021-08-01 ENCOUNTER — Telehealth: Payer: Self-pay

## 2021-08-01 ENCOUNTER — Encounter (HOSPITAL_COMMUNITY): Payer: Self-pay

## 2021-08-01 DIAGNOSIS — W19XXXA Unspecified fall, initial encounter: Secondary | ICD-10-CM

## 2021-08-01 DIAGNOSIS — M25562 Pain in left knee: Secondary | ICD-10-CM

## 2021-08-01 MED ORDER — KETOROLAC TROMETHAMINE 30 MG/ML IJ SOLN
15.0000 mg | Freq: Once | INTRAMUSCULAR | Status: AC
  Administered 2021-08-01: 15 mg via INTRAMUSCULAR

## 2021-08-01 MED ORDER — OXYCODONE HCL 5 MG PO TABS: 5.0000 mg | ORAL_TABLET | ORAL | 0 refills | Status: DC | PRN

## 2021-08-01 MED ORDER — OXYCODONE HCL 5 MG PO TABS
5.0000 mg | ORAL_TABLET | ORAL | 0 refills | Status: DC | PRN
Start: 2021-08-01 — End: 2021-08-01

## 2021-08-01 MED ORDER — KETOROLAC TROMETHAMINE 30 MG/ML IJ SOLN
INTRAMUSCULAR | Status: AC
  Filled 2021-08-01: qty 1

## 2021-08-01 NOTE — Discharge Instructions (Signed)
Meds ordered this encounter  Medications   ketorolac (TORADOL) 30 MG/ML injection 15 mg    

## 2021-08-01 NOTE — ED Notes (Signed)
Dr. Mannie Stabile notified of pts O2 saturation. Per pts daughter, pt has hx of COPD and baseline O2 is 70-80 when attending dr visits. Pt recently was ordered home O2 therapy.

## 2021-08-01 NOTE — ED Triage Notes (Signed)
Pt presents with c/o left leg pain that began yesterday. NKI

## 2021-08-01 NOTE — Telephone Encounter (Signed)
Pt family called regarding refill of oxycodone, refill sent to pharmacy for pickup, no further needs at this time.

## 2021-08-02 ENCOUNTER — Encounter: Payer: Medicare Other | Admitting: Internal Medicine

## 2021-08-02 NOTE — ED Provider Notes (Signed)
[place]   [id number] 08/01/21 Arrival Time: 5397  ASSESSMENT & PLAN:  1. Acute pain of left knee    I have personally viewed the imaging studies ordered this visit. No acute changes to LEFT knee appreciated.  Meds ordered this encounter  Medications   ketorolac (TORADOL) 30 MG/ML injection 15 mg   Knee extender for comfort. Has walker to assist ambulation at home.  Orders Placed This Encounter  Procedures   DG Knee Complete 4 Views Left    Recommend:  Follow-up Information     Schedule an appointment as soon as possible for a visit  with [facility] [place].   Specialty: Orthopedic Surgery Contact information: [facility] [address] [place] [address] [phone]                 Reviewed expectations re: course of current medical issues. Questions answered. Outlined signs and symptoms indicating need for more acute intervention. Patient verbalized understanding. After Visit Summary given.  SUBJECTIVE: History from: patient. Dustin Alvarez is a 66 y.o. male who reports LEFT knee pain; abrupt onset yesterday; reports feeling sharp pain in lateral knee that "almost made me fall". Pain has not relented today. No extremity sensation changes or weakness. Able to bear weight but with pain. No skin erythema. Afebrile. Percocet with mild relief.  Past Surgical History:  Procedure Laterality Date   ABDOMINAL AORTOGRAM W/LOWER EXTREMITY N/A 10/26/2020   Procedure: ABDOMINAL AORTOGRAM W/LOWER EXTREMITY;  Surgeon: Marty Heck, MD;  Location: [facility];  Service: Cardiovascular;  Laterality: N/A;   COLONOSCOPY WITH PROPOFOL N/A 05/22/2021   Procedure: COLONOSCOPY WITH PROPOFOL;  Surgeon: Doran Stabler, MD;  Location: [facility];  Service: Gastroenterology;  Laterality: N/A;   ESOPHAGOGASTRODUODENOSCOPY (EGD) WITH PROPOFOL N/A 05/22/2021   Procedure: ESOPHAGOGASTRODUODENOSCOPY  (EGD) WITH PROPOFOL;  Surgeon: Doran Stabler, MD;  Location: [facility];  Service: Gastroenterology;  Laterality: N/A;   HEMOSTASIS CLIP PLACEMENT  05/22/2021   Procedure: HEMOSTASIS CLIP PLACEMENT;  Surgeon: Doran Stabler, MD;  [facility]: [facility];  Service: Gastroenterology;;   HOT HEMOSTASIS N/A 05/22/2021   Procedure: HOT HEMOSTASIS (ARGON PLASMA COAGULATION/BICAP);  Surgeon: Doran Stabler, MD;  Location: [facility];  Service: Gastroenterology;  Laterality: N/A;   WRIST SURGERY Left 2017      OBJECTIVE:  Vitals:   08/01/21 1522  BP: 102/68  Pulse: 91  Resp: 18  Temp: 98.2 F (36.8 C)  TempSrc: Oral  SpO2: (!) 80%    Reports his baseline sats can hover in low 80s at times. No SOB.  General appearance: alert; no distress HEENT: [place]; AT Neck: supple with FROM Resp: unlabored respirations Extremities: LLE: warm with well perfused appearance; poorly localized moderate tenderness over left lateral knee; without gross deformities; swelling: minimal; bruising: none; knee ROM: normal, with discomfort CV: brisk extremity capillary refill of LLE; 2+ DP pulse of LLE. Skin: warm and dry; no visible rashes Neurologic: normal sensation and strength of LLE Psychological: alert and cooperative; normal mood and affect  Imaging: DG Knee Complete 4 Views Left  Result Date: 08/01/2021 CLINICAL DATA:  acute pain with fall EXAM: LEFT KNEE - COMPLETE 4+ VIEW COMPARISON:  None Available. FINDINGS: No evidence of fracture, dislocation, or joint effusion. 7 mm calcific/os ossific density along the lateral aspect of the femoral condyle likely related to enthesopathy or vascular calcifications. No definite avulsion fracture identified. No evidence of arthropathy or other focal bone  abnormality. Soft tissues are unremarkable. Vascular calcification. IMPRESSION: No acute displaced fracture or dislocation. Electronically Signed   By: Iven Finn M.D.   On: 08/01/2021 16:55       Allergies  Allergen Reactions   Ace Inhibitors Swelling    lisinopril   Lisinopril Swelling    Past Medical History:  Diagnosis Date   Asthma    COPD (chronic obstructive pulmonary disease) (HCC)    GERD (gastroesophageal reflux disease)    Hypertension    Prostate cancer ([place])    Social History   Socioeconomic History   Marital status: Widowed    Spouse name: Not on file   Number of children: Not on file   Years of education: Not on file   Highest education level: Not on file  Occupational History   Not on file  Tobacco Use   Smoking status: Some Days    Packs/day: 1.00    Years: 53.00    Total pack years: 53.00    Types: Cigarettes   Smokeless tobacco: Never   Tobacco comments:    0.5 PPD/Wants patches   Vaping Use   Vaping Use: Never used  Substance and Sexual Activity   Alcohol use: Yes    Comment: 1-2 drinks per week.   Drug use: Not Currently   Sexual activity: Not Currently  Other Topics Concern   Not on file  Social History Narrative   Not on file   Social Determinants of Health   Financial Resource Strain: Not on file  Food Insecurity: Not on file  Transportation Needs: Not on file  Physical Activity: Not on file  Stress: Not on file  Social Connections: Not on file   Family History  Problem Relation Age of Onset   COPD Father    Diabetes Sister    Diabetes Brother    Breast cancer Neg Hx    Prostate cancer Neg Hx    Colon cancer Neg Hx    Pancreatic cancer Neg Hx    Stomach cancer Neg Hx    Esophageal cancer Neg Hx    Past Surgical History:  Procedure Laterality Date   ABDOMINAL AORTOGRAM W/LOWER EXTREMITY N/A 10/26/2020   Procedure: ABDOMINAL AORTOGRAM W/LOWER EXTREMITY;  Surgeon: Marty Heck, MD;  Location: [facility];  Service: Cardiovascular;  Laterality: N/A;   COLONOSCOPY WITH PROPOFOL N/A 05/22/2021   Procedure: COLONOSCOPY WITH PROPOFOL;  Surgeon: Doran Stabler, MD;  [facility];  Service:  Gastroenterology;  Laterality: N/A;   ESOPHAGOGASTRODUODENOSCOPY (EGD) WITH PROPOFOL N/A 05/22/2021   Procedure: ESOPHAGOGASTRODUODENOSCOPY (EGD) WITH PROPOFOL;  Surgeon: Doran Stabler, MD;  [facility];  Service: Gastroenterology;  Laterality: N/A;   HEMOSTASIS CLIP PLACEMENT  05/22/2021   Procedure: HEMOSTASIS CLIP PLACEMENT;  Surgeon: Doran Stabler, MD;  [facility];  Service: Gastroenterology;;   HOT HEMOSTASIS N/A 05/22/2021   Procedure: HOT HEMOSTASIS (ARGON PLASMA COAGULATION/BICAP);  Surgeon: Doran Stabler, MD;  Location: [facility];  Service: Gastroenterology;  Laterality: N/A;   WRIST SURGERY Left 2017       Vanessa Kick, MD 08/02/21 [phone]

## 2021-08-03 ENCOUNTER — Other Ambulatory Visit (HOSPITAL_COMMUNITY): Payer: Self-pay

## 2021-08-06 ENCOUNTER — Encounter: Payer: Self-pay | Admitting: *Deleted

## 2021-08-07 ENCOUNTER — Ambulatory Visit (INDEPENDENT_AMBULATORY_CARE_PROVIDER_SITE_OTHER): Payer: Medicare Other | Admitting: Vascular Surgery

## 2021-08-07 ENCOUNTER — Encounter: Payer: Self-pay | Admitting: Vascular Surgery

## 2021-08-07 ENCOUNTER — Encounter: Payer: Self-pay | Admitting: Hematology and Oncology

## 2021-08-07 ENCOUNTER — Ambulatory Visit (HOSPITAL_COMMUNITY)
Admission: RE | Admit: 2021-08-07 | Discharge: 2021-08-07 | Disposition: A | Discharge status: Home / Self Care | Payer: Medicare Other | Source: Ambulatory Visit | Attending: Vascular Surgery | Admitting: Vascular Surgery

## 2021-08-07 VITALS — BP 110/68 | HR 79 | Temp 98.0°F | Resp 18 | Ht 71.0 in | Wt 141.0 lb

## 2021-08-07 DIAGNOSIS — I739 Peripheral vascular disease, unspecified: Secondary | ICD-10-CM

## 2021-08-10 ENCOUNTER — Other Ambulatory Visit: Payer: Self-pay | Admitting: Internal Medicine

## 2021-08-15 ENCOUNTER — Other Ambulatory Visit: Payer: Self-pay | Admitting: Physician Assistant

## 2021-08-15 DIAGNOSIS — J432 Centrilobular emphysema: Secondary | ICD-10-CM | POA: Diagnosis not present

## 2021-08-15 NOTE — Telephone Encounter (Signed)
Per clinic note, needed for pain. Gardiner Rhyme, RN

## 2021-08-20 ENCOUNTER — Other Ambulatory Visit: Payer: Self-pay | Admitting: Internal Medicine

## 2021-08-20 ENCOUNTER — Other Ambulatory Visit: Payer: Self-pay | Admitting: Hematology and Oncology

## 2021-08-21 ENCOUNTER — Other Ambulatory Visit: Payer: Self-pay

## 2021-08-21 ENCOUNTER — Other Ambulatory Visit (HOSPITAL_COMMUNITY): Payer: Self-pay

## 2021-08-21 MED ORDER — LIDOCAINE 5 % EX PTCH
1.0000 | MEDICATED_PATCH | CUTANEOUS | 0 refills | Status: DC
Start: 2021-08-21 — End: 2021-09-07

## 2021-08-22 ENCOUNTER — Other Ambulatory Visit: Payer: Self-pay

## 2021-08-22 ENCOUNTER — Other Ambulatory Visit (HOSPITAL_COMMUNITY): Payer: Self-pay

## 2021-08-22 ENCOUNTER — Other Ambulatory Visit: Payer: Self-pay | Admitting: Internal Medicine

## 2021-08-22 MED ORDER — TAMSULOSIN HCL 0.4 MG PO CAPS
0.4000 mg | ORAL_CAPSULE | Freq: Two times a day (BID) | ORAL | 0 refills | Status: DC
Start: 2021-08-22 — End: 2021-08-24

## 2021-08-24 ENCOUNTER — Inpatient Hospital Stay: Payer: Medicare Other | Attending: Hematology and Oncology | Admitting: Nurse Practitioner

## 2021-08-24 DIAGNOSIS — C7951 Secondary malignant neoplasm of bone: Secondary | ICD-10-CM | POA: Insufficient documentation

## 2021-08-24 DIAGNOSIS — C61 Malignant neoplasm of prostate: Secondary | ICD-10-CM | POA: Insufficient documentation

## 2021-08-24 MED ORDER — TAMSULOSIN HCL 0.4 MG PO CAPS: 0.4000 mg | ORAL_CAPSULE | Freq: Two times a day (BID) | ORAL | 0 refills | Status: DC

## 2021-08-27 ENCOUNTER — Other Ambulatory Visit (HOSPITAL_COMMUNITY): Payer: Self-pay

## 2021-08-27 ENCOUNTER — Ambulatory Visit: Payer: Medicare Other | Admitting: Podiatry

## 2021-08-29 ENCOUNTER — Other Ambulatory Visit (HOSPITAL_COMMUNITY): Payer: Self-pay

## 2021-08-29 ENCOUNTER — Telehealth: Payer: Self-pay

## 2021-08-29 NOTE — Telephone Encounter (Signed)
Pt family called regarding refills, pt phone visit scheduled for tomorrow and refills will be addressed then. Family made aware and agreeable to plan. No further concerns.

## 2021-08-30 ENCOUNTER — Inpatient Hospital Stay (HOSPITAL_BASED_OUTPATIENT_CLINIC_OR_DEPARTMENT_OTHER): Payer: Medicare Other | Admitting: Nurse Practitioner

## 2021-08-30 ENCOUNTER — Other Ambulatory Visit (HOSPITAL_COMMUNITY): Payer: Self-pay

## 2021-08-30 ENCOUNTER — Encounter: Payer: Self-pay | Admitting: Nurse Practitioner

## 2021-08-30 DIAGNOSIS — G893 Neoplasm related pain (acute) (chronic): Secondary | ICD-10-CM | POA: Diagnosis not present

## 2021-08-30 DIAGNOSIS — Z515 Encounter for palliative care: Secondary | ICD-10-CM | POA: Diagnosis not present

## 2021-08-30 DIAGNOSIS — C7951 Secondary malignant neoplasm of bone: Secondary | ICD-10-CM

## 2021-08-30 DIAGNOSIS — K5903 Drug induced constipation: Secondary | ICD-10-CM | POA: Diagnosis not present

## 2021-08-30 DIAGNOSIS — C61 Malignant neoplasm of prostate: Secondary | ICD-10-CM

## 2021-08-30 MED ORDER — XTAMPZA ER 36 MG PO C12A
36.0000 mg | EXTENDED_RELEASE_CAPSULE | Freq: Three times a day (TID) | ORAL | 0 refills | Status: DC
  Filled 2021-08-30: qty 90, 30d supply, fill #0

## 2021-08-30 MED ORDER — SENNOSIDES-DOCUSATE SODIUM 8.6-50 MG PO TABS
2.0000 | ORAL_TABLET | Freq: Every day | ORAL | 2 refills | Status: AC
  Filled 2021-08-30: qty 60, 30d supply, fill #0

## 2021-08-30 MED ORDER — OXYCODONE HCL 5 MG PO TABS
5.0000 mg | ORAL_TABLET | ORAL | 0 refills | Status: DC | PRN
  Filled 2021-08-30: qty 180, 15d supply, fill #0

## 2021-08-31 ENCOUNTER — Other Ambulatory Visit (HOSPITAL_COMMUNITY): Payer: Self-pay

## 2021-09-03 ENCOUNTER — Encounter: Payer: Self-pay | Admitting: Hematology and Oncology

## 2021-09-03 NOTE — Progress Notes (Signed)
[place]  [phone] [phone]) [phone]   Name: Dustin Alvarez Date: 09/03/2021 MRN: [id number]  DOB: 1955-12-15  Patient Care Team: [name], MD (Inactive) as PCP - General (Internal Medicine) Cira Rue, RN Nurse Navigator as Registered Nurse (Medical Oncology) Debera Lat, Carlena Sax, NP as Nurse Practitioner (Nurse Practitioner)    I connected with Dustin Alvarez on 08/30/21 at 12:00 PM EDT by phone and verified that I am speaking with the correct person using two identifiers.   I discussed the limitations, risks, security and privacy concerns of performing an evaluation and management service by telemedicine and the availability of in-person appointments. I also discussed with the patient that there may be a patient responsible charge related to this service. The patient expressed understanding and agreed to proceed.   Other persons participating in the visit and their role in the encounter: Dustin Alvarez (daughter), Maygan, RN    Patient's location: Home   Provider's location: [place]: Dustin Alvarez is a 66 y.o. 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  Palliative ask to see for symptom management and goals of care.    SOCIAL HISTORY:     reports that he has been smoking cigarettes. He has a 53.00 pack-year smoking history. He has never used smokeless tobacco. He reports current alcohol use. He reports that he does not currently use drugs.  ADVANCE DIRECTIVES:  Patient reports his children would be his medical decision makers. Does have MOST on file. Personally reviewed an discussed with no requested changes.    CODE STATUS: DNR  PAST MEDICAL HISTORY: Past Medical History:  Diagnosis Date   Asthma     COPD (chronic obstructive pulmonary disease) (HCC)    GERD (gastroesophageal reflux disease)    Hypertension    Prostate cancer ([place])     PAST SURGICAL HISTORY:  Past Surgical History:  Procedure Laterality Date   ABDOMINAL AORTOGRAM W/LOWER EXTREMITY N/A 10/26/2020   Procedure: ABDOMINAL AORTOGRAM W/LOWER EXTREMITY;  Surgeon: Marty Heck, MD;  Location: [facility];  Service: Cardiovascular;  Laterality: N/A;   COLONOSCOPY WITH PROPOFOL N/A 05/22/2021   Procedure: COLONOSCOPY WITH PROPOFOL;  Surgeon: Doran Stabler, MD;  [facility];  Service: Gastroenterology;  Laterality: N/A;   ESOPHAGOGASTRODUODENOSCOPY (EGD) WITH PROPOFOL N/A 05/22/2021   Procedure: ESOPHAGOGASTRODUODENOSCOPY (EGD) WITH PROPOFOL;  Surgeon: Doran Stabler, MD;  [facility];  Service: Gastroenterology;  Laterality: N/A;   HEMOSTASIS CLIP PLACEMENT  05/22/2021   Procedure: HEMOSTASIS CLIP PLACEMENT;  Surgeon: Doran Stabler, MD;  [facility];  Service: Gastroenterology;;   HOT HEMOSTASIS N/A 05/22/2021   Procedure: HOT HEMOSTASIS (ARGON PLASMA COAGULATION/BICAP);  Surgeon: Doran Stabler, MD;  Location: [facility];  Service: Gastroenterology;  Laterality: N/A;   WRIST SURGERY Left 2017    HEMATOLOGY/ONCOLOGY HISTORY:  Oncology History   No history exists.    ALLERGIES:  is allergic to ace inhibitors and lisinopril.  MEDICATIONS:  Current Outpatient Medications  Medication Sig Dispense Refill   senna-docusate (SENNA S) 8.6-50 MG tablet Take 2 tablets by mouth daily. 60 tablet 2   abiraterone acetate (ZYTIGA) 250 MG tablet TAKE 4 TABLETS BY MOUTH DAILY. TAKE ON AN EMPTY STOMACH 1 HOUR BEFORE OR 2 HOURS AFTER A MEAL 120 tablet  2   albuterol (PROVENTIL) (2.5 MG/3ML) 0.083% nebulizer solution Take 3 mLs (2.5 mg total) by nebulization every 6 (six) hours as needed for wheezing or shortness of breath. 180 mL 5   albuterol (VENTOLIN HFA) 108 (90 Base)  MCG/ACT inhaler INHALE TWO puffs into THE lungs EVERY SIX HOURS AS NEEDED wheezing OR For SHORTNESS OF BREATH 6.7 g 3   amLODipine (NORVASC) 5 MG tablet Take 1 tablet (5 mg total) by mouth daily. 90 tablet 1   calcium-vitamin D (OSCAL WITH D) 500-200 MG-UNIT tablet Take 1 tablet by mouth daily with breakfast. 90 tablet 3   docusate sodium (COLACE) 100 MG capsule Take 200 mg by mouth daily.     finasteride (PROSCAR) 5 MG tablet Take 1 tablet (5 mg total) by mouth daily. 90 tablet 1   Fluticasone-Umeclidin-Vilant (TRELEGY ELLIPTA) 200-62.5-25 MCG/ACT AEPB Inhale 1 puff into the lungs daily. Use once daily 60 each 11   gabapentin (NEURONTIN) 300 MG capsule Take 2 capsules (600 mg total) by mouth 2 (two) times daily. 120 capsule 2   lidocaine (LIDODERM) 5 % Place 1 patch onto the skin daily. Remove & Discard patch within 12 hours or as directed by MD 30 patch 0   nicotine (NICOTROL) 10 MG inhaler Inhale 1 continuous puffing into the lungs as needed for smoking cessation.     oxyCODONE (OXY IR/ROXICODONE) 5 MG immediate release tablet Take 1 - 2 tablets by mouth every 4 hours as needed for severe, breakthrough or moderate pain. 180 tablet 0   oxyCODONE ER (XTAMPZA ER) 36 MG C12A Take 1 capsule (36 mg total) by mouth every 8 (eight) hours. 90 capsule 0   pantoprazole (PROTONIX) 40 MG tablet Take 1 tablet (40 mg total) by mouth daily. 90 tablet 3   polyethylene glycol (MIRALAX) 17 g packet Mix 17 grams (1 packet) with 4-8 ounces of liquid and take by mouth 2 (two) times daily. 60 each 2   predniSONE (DELTASONE) 5 MG tablet y1y BY MOUTH EVERY DAY with breakfast .**Do not take prior TO taking Abiraterone** 90 tablet 1   sildenafil (VIAGRA) 100 MG tablet Take 1 tablet (100 mg total) by mouth daily as needed for erectile dysfunction. 30 tablet 0   sorbitol 70 % SOLN Take 15 mLs by mouth daily as needed. 473 mL 0   tamsulosin (FLOMAX) 0.4 MG CAPS capsule Take 1 capsule (0.4 mg total) by mouth in the morning and  at bedtime. 180 capsule 0   traZODone (DESYREL) 100 MG tablet Take 0.5 tablets (50 mg total) by mouth at bedtime. 90 tablet 1   No current facility-administered medications for this visit.    VITAL SIGNS: There were no vitals taken for this visit. There were no vitals filed for this visit.   Estimated body mass index is 19.67 kg/m as calculated from the following:   Height as of 08/07/21: '5\' 11"'$  (1.803 m).   Weight as of 08/07/21: 141 lb (64 kg).   PERFORMANCE STATUS (ECOG) : 1 - Symptomatic but completely ambulatory  IMPRESSION: I connected with Dustin Alvarez and his daughter for follow-up on symptom management. No acute distress noted. Reports he has been feeling well. No acute changes.    1.  Neoplasm related pain Dustin Alvarez states pain is well controlled on current regimen. Does not have to take Oxy IR around the clock.   We reviewed at length his current pain regimen which consists of Xtampza 36 mg every 8 hours, Oxy IR 5-10 mg  every 4 hours as needed for breakthrough pain, and gabapentin 600 mg twice daily. Per patient and daughter he is taking all medications as prescribed. Additional relief with use of lidocaine patches.   We will plan to continue to monitor and make adjustments as needed.   2.  Constipation Dustin Alvarez has constipation.  Education provided on increased risk due to opioid use and importance of strict bowel regimen to prevent constipation which could also contribute to escalation in his pain.    He reports he is taking Senna as requested. Also has sorbitol available. Some improvement with this regimen. We will continue to closely monitor.    I discussed the importance of continued conversation with family and their medical providers regarding overall plan of care and treatment options, ensuring decisions are within the context of the patients values and GOCs.  PLAN:  Detailed discussion regarding current pain regimen and education provided.  We will continue to  closely monitor and adjust as needed.  Xtampza 36 mg to every 8 hours. Tolerated well. 5-10 mg Oxy IR every 4 hours as needed for breakthrough pain as prescribed Gabapentin 600 mg twice daily.  Education provided to patient to take 2 tablets (300 mg).  Has been taking as prescribed with noticeable decrease in pain. Tolerating well.  Lidocaine patch to lower back Miralax twice daily. Patient reports he has not been taking but is having constipation. Education provided. Will consider Movantik or Linzess if no improvement in bowel regimen/constipation.  Sorbitol as needed for constipation Senna-S twice daily  Ongoing goals of care discussion.  I will plan to see patient back in 2-4 weeks in collaboration to other oncology appointments.    Patient expressed understanding and was in agreement with this plan. He also understands that He can call the clinic at any time with any questions, concerns, or complaints.   Thank you for your referral and allowing Palliative to assist in Dustin Alvarez's care.    Any controlled substances utilized were prescribed in the context of palliative care. PDMP has been reviewed.    Time Total: 40 min  Visit consisted of counseling and education dealing with the complex and emotionally intense issues of symptom management and palliative care in the setting of serious and potentially life-threatening illness.Greater than 50%  of this time was spent counseling and coordinating care related to the above assessment and plan.  Alda Lea, AGPCNP-BC  Palliative Medicine Team/[place]

## 2021-09-05 ENCOUNTER — Other Ambulatory Visit: Payer: Self-pay | Admitting: Nurse Practitioner

## 2021-09-07 ENCOUNTER — Encounter: Payer: Self-pay | Admitting: Hematology and Oncology

## 2021-09-11 ENCOUNTER — Other Ambulatory Visit: Payer: Self-pay | Admitting: Internal Medicine

## 2021-09-11 DIAGNOSIS — C7951 Secondary malignant neoplasm of bone: Secondary | ICD-10-CM

## 2021-09-15 DIAGNOSIS — J432 Centrilobular emphysema: Secondary | ICD-10-CM | POA: Diagnosis not present

## 2021-09-17 ENCOUNTER — Other Ambulatory Visit (HOSPITAL_COMMUNITY): Payer: Self-pay

## 2021-09-17 ENCOUNTER — Ambulatory Visit: Payer: Medicare Other | Admitting: Pulmonary Disease

## 2021-09-19 ENCOUNTER — Other Ambulatory Visit: Payer: Self-pay | Admitting: Hematology and Oncology

## 2021-09-19 ENCOUNTER — Other Ambulatory Visit (HOSPITAL_COMMUNITY): Payer: Self-pay

## 2021-09-19 MED ORDER — ABIRATERONE ACETATE 250 MG PO TABS
ORAL_TABLET | ORAL | 2 refills | Status: DC
  Filled 2021-09-19: qty 120, 30d supply, fill #0
  Filled 2021-10-25 – 2021-11-20 (×2): qty 120, 30d supply, fill #1
  Filled 2021-12-11: qty 120, 30d supply, fill #2

## 2021-09-24 ENCOUNTER — Other Ambulatory Visit (HOSPITAL_COMMUNITY): Payer: Self-pay

## 2021-09-28 ENCOUNTER — Other Ambulatory Visit: Payer: Self-pay

## 2021-09-28 ENCOUNTER — Other Ambulatory Visit (HOSPITAL_COMMUNITY): Payer: Self-pay

## 2021-09-28 DIAGNOSIS — Z515 Encounter for palliative care: Secondary | ICD-10-CM

## 2021-09-28 DIAGNOSIS — G893 Neoplasm related pain (acute) (chronic): Secondary | ICD-10-CM

## 2021-09-28 DIAGNOSIS — C61 Malignant neoplasm of prostate: Secondary | ICD-10-CM

## 2021-09-28 DIAGNOSIS — K5903 Drug induced constipation: Secondary | ICD-10-CM

## 2021-09-28 MED ORDER — XTAMPZA ER 36 MG PO C12A
36.0000 mg | EXTENDED_RELEASE_CAPSULE | Freq: Three times a day (TID) | ORAL | 0 refills | Status: DC
  Filled 2021-09-29: qty 70, 24d supply, fill #0
  Filled 2021-10-04: qty 20, 7d supply, fill #1

## 2021-09-28 MED ORDER — OXYCODONE HCL 5 MG PO TABS
5.0000 mg | ORAL_TABLET | ORAL | 0 refills | Status: DC | PRN
  Filled 2021-09-29: qty 180, 15d supply, fill #0

## 2021-09-29 ENCOUNTER — Other Ambulatory Visit (HOSPITAL_COMMUNITY): Payer: Self-pay

## 2021-10-04 ENCOUNTER — Ambulatory Visit (INDEPENDENT_AMBULATORY_CARE_PROVIDER_SITE_OTHER): Payer: Medicare Other | Admitting: Internal Medicine

## 2021-10-04 ENCOUNTER — Other Ambulatory Visit (HOSPITAL_COMMUNITY): Payer: Self-pay

## 2021-10-04 DIAGNOSIS — C61 Malignant neoplasm of prostate: Secondary | ICD-10-CM

## 2021-10-04 DIAGNOSIS — I10 Essential (primary) hypertension: Secondary | ICD-10-CM | POA: Diagnosis not present

## 2021-10-04 DIAGNOSIS — C7951 Secondary malignant neoplasm of bone: Secondary | ICD-10-CM

## 2021-10-04 DIAGNOSIS — G4700 Insomnia, unspecified: Secondary | ICD-10-CM | POA: Diagnosis not present

## 2021-10-04 DIAGNOSIS — G620 Drug-induced polyneuropathy: Secondary | ICD-10-CM | POA: Diagnosis not present

## 2021-10-04 DIAGNOSIS — T451X5A Adverse effect of antineoplastic and immunosuppressive drugs, initial encounter: Secondary | ICD-10-CM | POA: Diagnosis not present

## 2021-10-04 DIAGNOSIS — F17210 Nicotine dependence, cigarettes, uncomplicated: Secondary | ICD-10-CM | POA: Diagnosis not present

## 2021-10-04 DIAGNOSIS — G893 Neoplasm related pain (acute) (chronic): Secondary | ICD-10-CM | POA: Diagnosis not present

## 2021-10-04 DIAGNOSIS — N521 Erectile dysfunction due to diseases classified elsewhere: Secondary | ICD-10-CM

## 2021-10-04 DIAGNOSIS — I890 Lymphedema, not elsewhere classified: Secondary | ICD-10-CM | POA: Diagnosis not present

## 2021-10-04 MED ORDER — AMLODIPINE BESYLATE 5 MG PO TABS
5.0000 mg | ORAL_TABLET | Freq: Every day | ORAL | 1 refills | Status: DC
Start: 2021-10-04 — End: 2021-12-31

## 2021-10-04 MED ORDER — TAMSULOSIN HCL 0.4 MG PO CAPS: 0.4000 mg | ORAL_CAPSULE | Freq: Two times a day (BID) | ORAL | 0 refills | Status: AC

## 2021-10-04 MED ORDER — GABAPENTIN 300 MG PO CAPS
600.0000 mg | ORAL_CAPSULE | Freq: Two times a day (BID) | ORAL | 2 refills | Status: DC
Start: 2021-10-04 — End: 2022-01-30

## 2021-10-04 MED ORDER — LIDOCAINE 5 % EX PTCH: 1.0000 | MEDICATED_PATCH | CUTANEOUS | 0 refills | Status: DC

## 2021-10-04 MED ORDER — TRAZODONE HCL 100 MG PO TABS: 50.0000 mg | ORAL_TABLET | Freq: Every day | ORAL | 1 refills | Status: AC

## 2021-10-04 MED ORDER — SILDENAFIL CITRATE 100 MG PO TABS
100.0000 mg | ORAL_TABLET | Freq: Every day | ORAL | 0 refills | Status: AC | PRN
Start: 2021-10-04 — End: ?

## 2021-10-04 NOTE — Assessment & Plan Note (Signed)
Patient needs refill of tamsulosin. Plan:Refill sent.

## 2021-10-04 NOTE — Assessment & Plan Note (Signed)
BP controlled today, 128.83. Current regimen is amlodipine 5 mg daily. Plan:Continue amlodipine 5 mg daily.

## 2021-10-04 NOTE — Patient Instructions (Signed)
Dustin Alvarez,  It was nice seeing you today! Thank you for choosing [facility] for your Primary Care.    Today we talked about:   Swelling: Follow up with Dr. Carlis Abbott about further options for your leg swelling including higher compression stockings.  I would also follow-up with your oncologist about the pneumonia vaccine at your next visit.  We will plan to see you in 3 months.  My best, Dr. Marlou Sa

## 2021-10-04 NOTE — Assessment & Plan Note (Addendum)
Patient requesting refill of Viagra. Plan:Refill ordered.

## 2021-10-04 NOTE — Progress Notes (Signed)
   CC: routine check-up, medication refill  HPI:  [name] is a 66 y.o. person with past medical history as detailed below who presents for routine check up and for medication refills. Please see problem based charting for detailed assessment and plan.  Past Medical History:  Diagnosis Date   Asthma    COPD (chronic obstructive pulmonary disease) (HCC)    GERD (gastroesophageal reflux disease)    Hypertension    Prostate cancer ([place])    Review of Systems:  Negative unless otherwise stated.  Physical Exam:  Vitals:   10/04/21 1428 10/04/21 1458  BP: 128/83   Pulse: 83   Temp: 97.8 F (36.6 C)   TempSrc: Oral   SpO2: (!) 88% 96%  Weight: 154 lb 3.2 oz (69.9 kg)    Constitutional:Resting in no acute distress in wheelchair.  Cardio:Regular rate and rhythm. No murmurs, rubs, or gallops. Pulm:Positive for mild diffuse expiratory wheezing. Normal work of breathing on room air. MSK:1+ pitting edema to bilateral knees. Skin:Warm and dry. Neuro:Alert and oriented x3. No focal deficit noted. Psych:Pleasant mood and affect.  Assessment & Plan:   See Encounters Tab for problem based charting.  Hypertension BP controlled today, 128.83. Current regimen is amlodipine 5 mg daily. Plan:Continue amlodipine 5 mg daily.  ED (erectile dysfunction) Patient requesting refill of Viagra. Plan:Refill ordered.  Insomnia Patient needs refill on trazodone. Plan:Refill sent.  Lymphedema of right lower extremity Patient endorses continued problems with bilateral LE edema. He has been evaluated by [facility] and has a follow-up visit in September. He has been asked to wear compression stockings previously but does not think they are working because above the stockings, his legs are still swollen. I explained to him that the compression stockings are in fact working over the areas that they cover and that he may benefit from higher stockings to help when he has edema extending more  proximally. His daughter mentioned a specific type of stockings recommended by [facility]. I suggested that they discuss this further with [facility], but encouraged him to continue wearing his compression stockings and keeping the legs elevated.  Prostate cancer metastatic to bone [facility]) Patient needs refill of tamsulosin. Plan:Refill sent.  Chemotherapy-induced peripheral neuropathy (HCC) Patient needs refill of gabapentin. Plan: Refill sent.  Cancer associated pain Patient is requesting additional Lidocaine patch refills. Plan:Refill sent.  Patient discussed with Dr.  Cain Sieve

## 2021-10-04 NOTE — Assessment & Plan Note (Signed)
Patient endorses continued problems with bilateral LE edema. He has been evaluated by [facility] and has a follow-up visit in September. He has been asked to wear compression stockings previously but does not think they are working because above the stockings, his legs are still swollen. I explained to him that the compression stockings are in fact working over the areas that they cover and that he may benefit from higher stockings to help when he has edema extending more proximally. His daughter mentioned a specific type of stockings recommended by [facility]. I suggested that they discuss this further with [facility], but encouraged him to continue wearing his compression stockings and keeping the legs elevated.

## 2021-10-04 NOTE — Assessment & Plan Note (Signed)
Patient needs refill of gabapentin. Plan: Refill sent.

## 2021-10-04 NOTE — Assessment & Plan Note (Signed)
Patient needs refill on trazodone. Plan:Refill sent.

## 2021-10-04 NOTE — Assessment & Plan Note (Signed)
Patient is requesting additional Lidocaine patch refills. Plan:Refill sent.

## 2021-10-05 NOTE — Progress Notes (Signed)
Internal Medicine Clinic Attending ? ?Case discussed with Dr. Dean  At the time of the visit.  We reviewed the resident?s history and exam and pertinent patient test results.  I agree with the assessment, diagnosis, and plan of care documented in the resident?s note.  ?

## 2021-10-08 ENCOUNTER — Ambulatory Visit (INDEPENDENT_AMBULATORY_CARE_PROVIDER_SITE_OTHER): Payer: Medicare Other | Admitting: Pulmonary Disease

## 2021-10-08 ENCOUNTER — Encounter: Payer: Self-pay | Admitting: Pulmonary Disease

## 2021-10-08 ENCOUNTER — Ambulatory Visit (INDEPENDENT_AMBULATORY_CARE_PROVIDER_SITE_OTHER): Payer: Medicare Other

## 2021-10-08 VITALS — BP 100/60 | HR 93 | Ht 71.0 in | Wt 151.4 lb

## 2021-10-08 DIAGNOSIS — J9611 Chronic respiratory failure with hypoxia: Secondary | ICD-10-CM | POA: Diagnosis not present

## 2021-10-08 DIAGNOSIS — R0902 Hypoxemia: Secondary | ICD-10-CM

## 2021-10-08 DIAGNOSIS — J441 Chronic obstructive pulmonary disease with (acute) exacerbation: Secondary | ICD-10-CM

## 2021-10-08 DIAGNOSIS — J449 Chronic obstructive pulmonary disease, unspecified: Secondary | ICD-10-CM | POA: Diagnosis not present

## 2021-10-08 MED ORDER — PREDNISONE 10 MG PO TABS: 40.0000 mg | ORAL_TABLET | Freq: Every day | ORAL | 0 refills | Status: DC

## 2021-10-08 NOTE — Progress Notes (Signed)
Subjective:   PATIENT ID: Dustin Alvarez GENDER: male DOB: [birth year], MRN: [id number]   HPI  Chief Complaint  Patient presents with   Follow-up    Reason for Visit: Follow-up  [name] is 66 year old male active smoker with metastatic prostate cancer with bone involvement s/p Lupron injection and Zometa 01/17/21, COPD, HTN, GERD, IDA who presents for follow-up.  He was diagnosed with COPD in 2013. He is on Trelegy and Albuterol. He uses albuterol twice a day. He is currently smoking 1/2 ppd. He reports shortness of breath, coughing or wheezing, congested cough daily. Uses a cane however does not ambulate very often due to leg pain. Has had worsening right leg pain and swelling. Previously had ultrasound which was negative but now worsening skin tightness. He has 6 exacerbations a year. No hospitalizations.  10/08/21 Daughter is present and provides history. Compliant with Trelegy however reports shortness of breath, cough and wheezing is unchanged. Denies recent fevers/chills. Heat will worsen it. Reports that his O2 sats were low to 88% at his PCP office last week and recovered with rest. Reportedly he was treated with nebulizers in the office but not sent home on steroids or antibiotics. He is compliant with nightly oxygen. Does not check his O2 levels during the day. He continues to smoke.  Social History: Active smoker. 53 pack years.  Past Medical History:  Diagnosis Date   Asthma    COPD (chronic obstructive pulmonary disease) (HCC)    GERD (gastroesophageal reflux disease)    Hypertension    Prostate cancer ([place])      Family History  Problem Relation Age of Onset   COPD Father    Diabetes Sister    Diabetes Brother    Breast cancer Neg Hx    Prostate cancer Neg Hx    Colon cancer Neg Hx    Pancreatic cancer Neg Hx    Stomach cancer Neg Hx    Esophageal cancer Neg Hx      Social History   Occupational History   Not on file  Tobacco Use    Smoking status: Some Days    Packs/day: 1.00    Years: 53.00    Total pack years: 53.00    Types: Cigarettes   Smokeless tobacco: Never   Tobacco comments:    0.5 PPD/Wants patches   Vaping Use   Vaping Use: Never used  Substance and Sexual Activity   Alcohol use: Yes    Comment: 1-2 drinks per week.   Drug use: Not Currently   Sexual activity: Not Currently    Allergies  Allergen Reactions   Ace Inhibitors Swelling    lisinopril   Lisinopril Swelling     Outpatient Medications Prior to Visit  Medication Sig Dispense Refill   abiraterone acetate (ZYTIGA) 250 MG tablet TAKE 4 TABLETS BY MOUTH DAILY. TAKE ON AN EMPTY STOMACH 1 HOUR BEFORE OR 2 HOURS AFTER A MEAL 120 tablet 2   albuterol (PROVENTIL) (2.5 MG/3ML) 0.083% nebulizer solution Take 3 mLs (2.5 mg total) by nebulization every 6 (six) hours as needed for wheezing or shortness of breath. 180 mL 5   albuterol (VENTOLIN HFA) 108 (90 Base) MCG/ACT inhaler INHALE TWO puffs into THE lungs EVERY SIX HOURS AS NEEDED wheezing OR For SHORTNESS OF BREATH 6.7 g 3   amLODipine (NORVASC) 5 MG tablet Take 1 tablet (5 mg total) by mouth daily. 90 tablet 1   calcium-vitamin D (OSCAL WITH D) 500-200 MG-UNIT tablet  Take 1 tablet by mouth daily with breakfast. 90 tablet 3   docusate sodium (COLACE) 100 MG capsule Take 200 mg by mouth daily.     finasteride (PROSCAR) 5 MG tablet Take 1 tablet (5 mg total) by mouth daily. 90 tablet 3   Fluticasone-Umeclidin-Vilant (TRELEGY ELLIPTA) 200-62.5-25 MCG/ACT AEPB Inhale 1 puff into the lungs daily. Use once daily 60 each 11   gabapentin (NEURONTIN) 300 MG capsule Take 2 capsules (600 mg total) by mouth 2 (two) times daily. 120 capsule 2   lidocaine (LIDODERM) 5 % Place 1 patch onto the skin daily. Remove & Discard patch within 12 hours or as directed by MD 30 patch 0   nicotine (NICOTROL) 10 MG inhaler Inhale 1 continuous puffing into the lungs as needed for smoking cessation.     oxyCODONE (OXY  IR/ROXICODONE) 5 MG immediate release tablet Take 1 - 2 tablets by mouth every 4 hours as needed for severe, breakthrough or moderate pain. 180 tablet 0   oxyCODONE ER (XTAMPZA ER) 36 MG C12A Take 1 capsule (36 mg total) by mouth every 8 (eight) hours. 90 capsule 0   pantoprazole (PROTONIX) 40 MG tablet Take 1 tablet (40 mg total) by mouth daily. 90 tablet 3   polyethylene glycol (MIRALAX) 17 g packet Mix 17 grams (1 packet) with 4-8 ounces of liquid and take by mouth 2 (two) times daily. 60 each 2   predniSONE (DELTASONE) 5 MG tablet y1y BY MOUTH EVERY DAY with breakfast .**Do not take prior TO taking Abiraterone** 90 tablet 1   senna-docusate (SENNA S) 8.6-50 MG tablet Take 2 tablets by mouth daily. 60 tablet 2   sildenafil (VIAGRA) 100 MG tablet Take 1 tablet (100 mg total) by mouth daily as needed for erectile dysfunction. 30 tablet 0   sorbitol 70 % SOLN Take 15 mLs by mouth daily as needed. 473 mL 0   tamsulosin (FLOMAX) 0.4 MG CAPS capsule Take 1 capsule (0.4 mg total) by mouth in the morning and at bedtime. 180 capsule 0   traZODone (DESYREL) 100 MG tablet Take 0.5 tablets (50 mg total) by mouth at bedtime. 90 tablet 1   No facility-administered medications prior to visit.    Review of Systems  Constitutional:  Negative for chills, diaphoresis, fever, malaise/fatigue and weight loss.  HENT:  Negative for congestion.   Respiratory:  Positive for cough, shortness of breath and wheezing. Negative for hemoptysis and sputum production.   Cardiovascular:  Negative for chest pain, palpitations and leg swelling.     Objective:   Vitals:   10/08/21 1112  BP: 100/60  Pulse: 93  SpO2: 93%  Weight: 151 lb 6.4 oz (68.7 kg)  Height: '5\' 11"'$  (1.803 m)     Physical Exam: General: Well-appearing, no acute distress HENT: [place], AT Eyes: EOMI, no scleral icterus Respiratory: Diminished lung bases, scattered expiratory wheezes Cardiovascular: RRR, -M/R/G, no JVD Extremities:Trace pedal edema  in LE bilaterally,-tenderness Neuro: AAO x4, CNII-XII grossly intact Psych: Normal mood, normal affect  Data Reviewed:  Imaging: CT CAP 04/04/21 - Centrilobular and paraseptal emphysema. No pulmonary nodules CXR 10/08/21 - No focal infiltrate, pulmonary edema or effusion  PFT: None on file  Labs: CBC    Component Value Date/Time   WBC 4.3 07/18/2021 1452   WBC 6.3 05/22/2021 0955   RBC 5.06 07/18/2021 1452   HGB 12.2 (L) 07/18/2021 1452   HCT 38.9 (L) 07/18/2021 1452   PLT 293 07/18/2021 1452   MCV 76.9 (L) 07/18/2021 1452  MCH 24.1 (L) 07/18/2021 1452   MCHC 31.4 07/18/2021 1452   RDW 22.8 (H) 07/18/2021 1452   LYMPHSABS 0.7 07/18/2021 1452   MONOABS 0.4 07/18/2021 1452   EOSABS 0.0 07/18/2021 1452   BASOSABS 0.0 07/18/2021 1452   Absolute 05/17/21 - 0     Assessment & Plan:   Discussion: 66 year old male active smoker with COPD, nocturnal hypoxemia, metastatic prostate cancer with bone involvement s/p Lupron injection and Zometa 01/17/21,  HTN, GERD and IDA who presents for COPD follow-up. Ambulatory O2 with new O2 requirement on exertion. May be baseline vs current exacerbation. I contacted his PCP Dr. Marlou Sa to verify that patient did not receive nebulizer treatment in the office and no concern for exacerbation at the last visit. Extensively discussed clinical course and management of COPD including bronchodilator regimen and action plan for exacerbation.  COPD exacerbation Emphysema --CONTINUE Trelegy 200-62.5-25 mcg ONE puff ONCE a day. REFILL --CONTINUE Albuterol TWO puffs AS NEEDED for shortness of breath or wheezing. REFILL --CONTINUE Albuterol nebs AS NEEDED --Prednisone 40 mg x 5 days and then resume prednisone 5 mg  Acute on chronic hypoxemic respiratory failure 2/2 COPD exacerbation Nocturnal hypoxemia --WEAR 3L oxygen with activity and sleep  Tobacco abuse Patient is an active smoker.   Right lower extremity swelling Hx left SFA occlusion Prior 05/16/21  neg Compression stocking per Vascular  Metastatic prostate cancer - no progressive findings on 03/2021 imaging Following Oncology Pain control by Oncology  Health Maintenance Immunization History  Administered Date(s) Administered   Influenza,inj,Quad PF,6+ Mos 11/01/2019   Pneumococcal Conjugate-13 11/01/2019   Tdap 11/01/2019   CT Lung Screen - not due. Once cancer surveillance completed, consider enrolling  Orders Placed This Encounter  Procedures   DG Chest 2 View    Standing Status:   Future    Number of Occurrences:   1    Standing Expiration Date:   10/09/2022    Order Specific Question:   Reason for Exam (SYMPTOM  OR DIAGNOSIS REQUIRED)    Answer:   hypoxemia    Order Specific Question:   Preferred imaging location?    Answer:   Internal   Ambulatory Referral for DME    Referral Priority:   Routine    Referral Type:   Durable Medical Equipment Purchase    Number of Visits Requested:   1   Meds ordered this encounter  Medications   predniSONE (DELTASONE) 10 MG tablet    Sig: Take 4 tablets (40 mg total) by mouth daily with breakfast.    Dispense:  20 tablet    Refill:  0    Return in about 2 months (around 12/08/2021).  I have spent a total time of 45-minutes on the day of the appointment including chart review, data review, collecting history, coordinating care and discussing medical diagnosis and plan with the patient/family. Past medical history, allergies, medications were reviewed. Pertinent imaging, labs and tests included in this note have been reviewed and interpreted independently by me.  [place], MD [facility] 10/08/2021 11:22 AM  Office Number [phone]

## 2021-10-08 NOTE — Patient Instructions (Signed)
COPD exacerbation Emphysema --CONTINUE Trelegy 200-62.5-25 mcg ONE puff ONCE a day. REFILL --CONTINUE Albuterol TWO puffs AS NEEDED for shortness of breath or wheezing. REFILL --CONTINUE Albuterol nebs AS NEEDED --Prednisone 40 mg x 5 days and then resume prednisone 5 mg  Chronic hypoxemic respiratory failure --WEAR 3L oxygen with activity and sleep  Follow-up with me in 2 months

## 2021-10-09 DIAGNOSIS — J432 Centrilobular emphysema: Secondary | ICD-10-CM | POA: Diagnosis not present

## 2021-10-14 ENCOUNTER — Encounter: Payer: Self-pay | Admitting: Pulmonary Disease

## 2021-10-16 DIAGNOSIS — J432 Centrilobular emphysema: Secondary | ICD-10-CM | POA: Diagnosis not present

## 2021-10-17 ENCOUNTER — Encounter: Payer: Self-pay | Admitting: Nurse Practitioner

## 2021-10-17 ENCOUNTER — Other Ambulatory Visit (HOSPITAL_COMMUNITY): Payer: Self-pay

## 2021-10-17 ENCOUNTER — Other Ambulatory Visit: Payer: Self-pay | Admitting: Hematology and Oncology

## 2021-10-17 ENCOUNTER — Inpatient Hospital Stay (HOSPITAL_BASED_OUTPATIENT_CLINIC_OR_DEPARTMENT_OTHER): Payer: Medicare Other | Admitting: Hematology and Oncology

## 2021-10-17 ENCOUNTER — Inpatient Hospital Stay: Payer: Medicare Other | Attending: Hematology and Oncology

## 2021-10-17 ENCOUNTER — Other Ambulatory Visit: Payer: Self-pay

## 2021-10-17 ENCOUNTER — Inpatient Hospital Stay: Payer: Medicare Other

## 2021-10-17 ENCOUNTER — Inpatient Hospital Stay (HOSPITAL_BASED_OUTPATIENT_CLINIC_OR_DEPARTMENT_OTHER): Payer: Medicare Other | Admitting: Nurse Practitioner

## 2021-10-17 VITALS — BP 137/79 | HR 101 | Temp 97.5°F | Resp 17 | Wt 144.0 lb

## 2021-10-17 DIAGNOSIS — G893 Neoplasm related pain (acute) (chronic): Secondary | ICD-10-CM | POA: Diagnosis not present

## 2021-10-17 DIAGNOSIS — Z515 Encounter for palliative care: Secondary | ICD-10-CM

## 2021-10-17 DIAGNOSIS — C7951 Secondary malignant neoplasm of bone: Secondary | ICD-10-CM | POA: Diagnosis not present

## 2021-10-17 DIAGNOSIS — C61 Malignant neoplasm of prostate: Secondary | ICD-10-CM | POA: Diagnosis present

## 2021-10-17 DIAGNOSIS — K5903 Drug induced constipation: Secondary | ICD-10-CM | POA: Diagnosis not present

## 2021-10-17 LAB — CBC WITH DIFFERENTIAL (CANCER CENTER ONLY)
Abs Immature Granulocytes: 0.01 10*3/uL (ref 0.00–0.07)
Basophils Absolute: 0 10*3/uL (ref 0.0–0.1)
Basophils Relative: 0 %
Eosinophils Absolute: 0 10*3/uL (ref 0.0–0.5)
Eosinophils Relative: 0 %
HCT: 35.1 % — ABNORMAL LOW (ref 39.0–52.0)
Hemoglobin: 11.4 g/dL — ABNORMAL LOW (ref 13.0–17.0)
Immature Granulocytes: 0 %
Lymphocytes Relative: 4 %
Lymphs Abs: 0.2 10*3/uL — ABNORMAL LOW (ref 0.7–4.0)
MCH: 24.4 pg — ABNORMAL LOW (ref 26.0–34.0)
MCHC: 32.5 g/dL (ref 30.0–36.0)
MCV: 75.2 fL — ABNORMAL LOW (ref 80.0–100.0)
Monocytes Absolute: 0.1 10*3/uL (ref 0.1–1.0)
Monocytes Relative: 2 %
Neutro Abs: 5 10*3/uL (ref 1.7–7.7)
Neutrophils Relative %: 94 %
Platelet Count: 322 10*3/uL (ref 150–400)
RBC: 4.67 MIL/uL (ref 4.22–5.81)
RDW: 19.6 % — ABNORMAL HIGH (ref 11.5–15.5)
WBC Count: 5.4 10*3/uL (ref 4.0–10.5)
nRBC: 0 % (ref 0.0–0.2)

## 2021-10-17 LAB — CMP (CANCER CENTER ONLY)
ALT: 11 U/L (ref 0–44)
AST: 15 U/L (ref 15–41)
Albumin: 3.7 g/dL (ref 3.5–5.0)
Alkaline Phosphatase: 74 U/L (ref 38–126)
Anion gap: 5 (ref 5–15)
BUN: 13 mg/dL (ref 8–23)
CO2: 30 mmol/L (ref 22–32)
Calcium: 9.2 mg/dL (ref 8.9–10.3)
Chloride: 107 mmol/L (ref 98–111)
Creatinine: 0.76 mg/dL (ref 0.61–1.24)
GFR, Estimated: >60 mL/min (ref >60)
Glucose, Bld: 162 mg/dL — ABNORMAL HIGH (ref 70–99)
Potassium: 4.2 mmol/L (ref 3.5–5.1)
Sodium: 142 mmol/L (ref 135–145)
Total Bilirubin: 0.4 mg/dL (ref 0.3–1.2)
Total Protein: 7.5 g/dL (ref 6.5–8.1)

## 2021-10-17 MED ORDER — ALBUTEROL SULFATE (2.5 MG/3ML) 0.083% IN NEBU: 2.5000 mg | INHALATION_SOLUTION | Freq: Four times a day (QID) | RESPIRATORY_TRACT | 5 refills | Status: AC | PRN

## 2021-10-17 MED ORDER — ZOLEDRONIC ACID 4 MG/100ML IV SOLN: 4.0000 mg | Freq: Once | INTRAVENOUS | Status: DC

## 2021-10-17 MED ORDER — LEUPROLIDE ACETATE (3 MONTH) 22.5 MG ~~LOC~~ KIT: 22.5000 mg | PACK | Freq: Once | SUBCUTANEOUS | Status: DC

## 2021-10-17 MED ORDER — OXYCODONE HCL 5 MG PO TABS
5.0000 mg | ORAL_TABLET | ORAL | 0 refills | Status: DC | PRN
  Filled 2021-10-17: qty 180, 15d supply, fill #0

## 2021-10-17 NOTE — Progress Notes (Signed)
[place] [phone]   [phone]  PROGRESS NOTE  Patient Care Team: Farrel Gordon, DO as PCP - General (Internal Medicine) Cira Rue, RN Nurse Navigator as Registered Nurse (Medical Oncology) Pickenpack-Cousar, Carlena Sax, NP as Nurse Practitioner (Nurse Practitioner)  Hematological/Oncological History # Metastatic Castrate Sensitive Prostate Cancer, Metastatic to Bone 1) 07/13/2019: Abdomen/Pelvis CT extensive lytic changes with superimposed pathological fractures. Lytic change noted in the right iliac bone and new lucencies in the right T10 vertebrae.  2) 07/19/2019: biopsy of sacral mass shows metastatic prostatic adenocarcinoma, Gleason 4+4.  3) 07/2019: reportedly received Zometa 4g IV and eligard 22.5. Started on Casodex.  4) 6/10-6/16/2021: received palliative radiation to the sacrum at [place]. Received 2000cGy over 6 days 5) Moved to [place]. Lost to follow up from [facility] in [place], [place]. 6) 11/05/2019: establish care with Dr. Lorenso Courier  7) 11/16/2019: Administered Zometa 37m IV and Lupron 22.5 mg 8) 12/08/2019: started abiraterone 10073mPO daily  9) 03/09/2020: Administered Zometa 25m3mV and Lupron 22.5 mg 10) 06/01/2020: Administered Zometa 25mg25m and Lupron 22.5 mg 11) No show for interval zometa/lupron 12) 01/17/2021: Administered Zometa 25mg 16mand Lupron 22.5 mg 13) 04/13/2021: Administered Lupron 22.5 mg  and Lupron 22.5 mg 14) 07/25/2021: Administered Lupron 22.5 mg and Lupron 22.5 mg  Interval History:  Dustin Alvarez.41 male with medical history significant for metastatic prostate cancer who presents for a follow up visit. The patient's last visit was on 07/18/2020. In the interim since the last visit he has had no major changes in his health.   On exam today Dustin Alvarez he has been well overall and since her last visit.  He continues to struggle with leg pain.  He reports that he is having more pain in his  ankles and some occasional pain in his toes.  He is also developed some darkening of the skin on his heels bilaterally.  He reports he is taking 3 of the long-acting oxycodone per day 2 of the 5 mg pills every 4 hours as needed.  He notes that he is not having any trouble with bleeding, bruising, or dark stools.  His bowels are moving regularly.  He is not having any lightheadedness or dizziness.  He otherwise denies any fevers, chills, sweats, nausea, vomiting or diarrhea.  Full 10 point ROS is listed below.    MEDICAL HISTORY:  Past Medical History:  Diagnosis Date   Asthma    COPD (chronic obstructive pulmonary disease) (HCC)    GERD (gastroesophageal reflux disease)    Hypertension    Prostate cancer (HCC) [place] SURGICAL HISTORY: Past Surgical History:  Procedure Laterality Date   ABDOMINAL AORTOGRAM W/LOWER EXTREMITY N/A 10/26/2020   Procedure: ABDOMINAL AORTOGRAM W/LOWER EXTREMITY;  Surgeon: ClarkMarty Heck  [facility];  Service: Cardiovascular;  Laterality: N/A;   COLONOSCOPY WITH PROPOFOL N/A 05/22/2021   Procedure: COLONOSCOPY WITH PROPOFOL;  Surgeon: DanisDoran Stabler  Location: [facility];  Service: Gastroenterology;  Laterality: N/A;   ESOPHAGOGASTRODUODENOSCOPY (EGD) WITH PROPOFOL N/A 05/22/2021   Procedure: ESOPHAGOGASTRODUODENOSCOPY (EGD) WITH PROPOFOL;  Surgeon: DanisDoran Stabler  Location: [facility];  Service: Gastroenterology;  Laterality: N/A;   HEMOSTASIS CLIP PLACEMENT  05/22/2021   Procedure: HEMOSTASIS CLIP PLACEMENT;  Surgeon: DanisDoran Stabler  Location: [facility];  Service: Gastroenterology;;   HOT HEMOSTASIS N/A 05/22/2021   Procedure: HOT HEMOSTASIS (ARGON PLASMA COAGULATION/BICAP);  Surgeon:  Doran Stabler, MD;  Location: [facility];  Service: Gastroenterology;  Laterality: N/A;   WRIST SURGERY Left 2017    SOCIAL HISTORY: Social History   Socioeconomic History   Marital status: Widowed    Spouse name:  Not on file   Number of children: Not on file   Years of education: Not on file   Highest education level: Not on file  Occupational History   Not on file  Tobacco Use   Smoking status: Some Days    Packs/day: 1.00    Years: 53.00    Total pack years: 53.00    Types: Cigarettes   Smokeless tobacco: Never   Tobacco comments:    0.5 PPD/Wants patches   Vaping Use   Vaping Use: Never used  Substance and Sexual Activity   Alcohol use: Yes    Comment: 1-2 drinks per week.   Drug use: Not Currently   Sexual activity: Not Currently  Other Topics Concern   Not on file  Social History Narrative   Not on file   Social Determinants of Health   Financial Resource Strain: Not on file  Food Insecurity: Not on file  Transportation Needs: Not on file  Physical Activity: Not on file  Stress: Not on file  Social Connections: Not on file  Intimate Partner Violence: Not on file    FAMILY HISTORY: Family History  Problem Relation Age of Onset   COPD Father    Diabetes Sister    Diabetes Brother    Breast cancer Neg Hx    Prostate cancer Neg Hx    Colon cancer Neg Hx    Pancreatic cancer Neg Hx    Stomach cancer Neg Hx    Esophageal cancer Neg Hx     ALLERGIES:  is allergic to ace inhibitors and lisinopril.  MEDICATIONS:  Current Outpatient Medications  Medication Sig Dispense Refill   abiraterone acetate (ZYTIGA) 250 MG tablet TAKE 4 TABLETS BY MOUTH DAILY. TAKE ON AN EMPTY STOMACH 1 HOUR BEFORE OR 2 HOURS AFTER A MEAL 120 tablet 2   albuterol (PROVENTIL) (2.5 MG/3ML) 0.083% nebulizer solution Take 3 mLs (2.5 mg total) by nebulization every 6 (six) hours as needed for wheezing or shortness of breath. 180 mL 5   albuterol (VENTOLIN HFA) 108 (90 Base) MCG/ACT inhaler INHALE TWO puffs into THE lungs EVERY SIX HOURS AS NEEDED wheezing OR For SHORTNESS OF BREATH 6.7 g 3   amLODipine (NORVASC) 5 MG tablet Take 1 tablet (5 mg total) by mouth daily. 90 tablet 1   calcium-vitamin D  (OSCAL WITH D) 500-200 MG-UNIT tablet Take 1 tablet by mouth daily with breakfast. 90 tablet 3   docusate sodium (COLACE) 100 MG capsule Take 200 mg by mouth daily.     finasteride (PROSCAR) 5 MG tablet Take 1 tablet (5 mg total) by mouth daily. 90 tablet 3   Fluticasone-Umeclidin-Vilant (TRELEGY ELLIPTA) 200-62.5-25 MCG/ACT AEPB Inhale 1 puff into the lungs daily. Use once daily 60 each 11   gabapentin (NEURONTIN) 300 MG capsule Take 2 capsules (600 mg total) by mouth 2 (two) times daily. 120 capsule 2   lidocaine (LIDODERM) 5 % Place 1 patch onto the skin daily. Remove & Discard patch within 12 hours or as directed by MD 30 patch 0   nicotine (NICOTROL) 10 MG inhaler Inhale 1 continuous puffing into the lungs as needed for smoking cessation.     oxyCODONE (OXY IR/ROXICODONE) 5 MG immediate release tablet Take 1 - 2  tablets by mouth every 4 hours as needed for severe, breakthrough or moderate pain. 180 tablet 0   oxyCODONE ER (XTAMPZA ER) 36 MG C12A Take 1 capsule (36 mg total) by mouth every 8 (eight) hours. 90 capsule 0   pantoprazole (PROTONIX) 40 MG tablet Take 1 tablet (40 mg total) by mouth daily. 90 tablet 3   polyethylene glycol (MIRALAX) 17 g packet Mix 17 grams (1 packet) with 4-8 ounces of liquid and take by mouth 2 (two) times daily. 60 each 2   predniSONE (DELTASONE) 10 MG tablet Take 4 tablets (40 mg total) by mouth daily with breakfast. 20 tablet 0   predniSONE (DELTASONE) 5 MG tablet y1y BY MOUTH EVERY DAY with breakfast .**Do not take prior TO taking Abiraterone** 90 tablet 1   senna-docusate (SENNA S) 8.6-50 MG tablet Take 2 tablets by mouth daily. 60 tablet 2   sildenafil (VIAGRA) 100 MG tablet Take 1 tablet (100 mg total) by mouth daily as needed for erectile dysfunction. 30 tablet 0   sorbitol 70 % SOLN Take 15 mLs by mouth daily as needed. 473 mL 0   tamsulosin (FLOMAX) 0.4 MG CAPS capsule Take 1 capsule (0.4 mg total) by mouth in the morning and at bedtime. 180 capsule 0    traZODone (DESYREL) 100 MG tablet Take 0.5 tablets (50 mg total) by mouth at bedtime. 90 tablet 1   No current facility-administered medications for this visit.    REVIEW OF SYSTEMS:   Constitutional: ( - ) fevers, ( - )  chills , ( - ) night sweats Eyes: ( - ) blurriness of vision, ( - ) double vision, ( - ) watery eyes Ears, nose, mouth, throat, and face: ( - ) mucositis, ( - ) sore throat Respiratory: ( - ) cough, ( - ) dyspnea, ( - ) wheezes Cardiovascular: ( - ) palpitation, ( - ) chest discomfort, ( - ) lower extremity swelling Gastrointestinal:  ( - ) nausea, ( - ) heartburn, ( - ) change in bowel habits Skin: ( - ) abnormal skin rashes Lymphatics: ( - ) new lymphadenopathy, ( - ) easy bruising Neurological: ( - ) numbness, ( - ) tingling, ( - ) new weaknesses Behavioral/Psych: ( - ) mood change, ( - ) new changes  All other systems were reviewed with the patient and are negative.  PHYSICAL EXAMINATION:  Vitals:   10/17/21 1455  BP: 137/79  Pulse: (!) 101  Resp: 17  Temp: (!) 97.5 F (36.4 C)  SpO2: 97%     Filed Weights   10/17/21 1455  Weight: 144 lb (65.3 kg)      GENERAL: well appearing elderly African American male in NAD. SKIN: skin color, texture, turgor are normal, no rashes or significant lesions EYES: conjunctiva are pink and non-injected, sclera clear LUNGS: clear to auscultation and percussion with normal breathing effort HEART: regular rate & rhythm and no murmurs and no lower extremity edema Musculoskeletal: no cyanosis of digits and no clubbing  PSYCH: alert & oriented x 3, fluent speech NEURO: no focal motor/sensory deficits  LABORATORY DATA:  I have reviewed the data as listed    Latest Ref Rng & Units 10/17/2021    2:16 PM 07/18/2021    2:52 PM 05/22/2021    9:55 AM  CBC  WBC 4.0 - 10.5 K/uL 5.4  4.3  6.3   Hemoglobin 13.0 - 17.0 g/dL 11.4  12.2  9.8   Hematocrit 39.0 - 52.0 % 35.1  38.9  33.4   Platelets 150 - 400 K/uL 322  293  443         Latest Ref Rng & Units 10/17/2021    2:16 PM 07/18/2021    2:52 PM 05/15/2021    3:19 PM  CMP  Glucose 70 - 99 mg/dL 162  109  89   BUN 8 - 23 mg/dL _0 Creatinine 0.61 - 1.24 mg/dL 0.76  0.79  0.68   Sodium 135 - 145 mmol/L 142  142  137   Potassium 3.5 - 5.1 mmol/L 4.2  3.9  4.2   Chloride 98 - 111 mmol/L 107  105  98   CO2 22 - 32 mmol/L _1 Calcium 8.9 - 10.3 mg/dL 9.2  9.4  9.8   Total Protein 6.5 - 8.1 g/dL 7.5  7.9    Total Bilirubin 0.3 - 1.2 mg/dL 0.4  0.3    Alkaline Phos 38 - 126 U/L 74  84    AST 15 - 41 U/L 15  16    ALT 0 - 44 U/L 11  10      RADIOGRAPHIC STUDIES: DG Chest 2 View  Result Date: 10/08/2021 CLINICAL DATA:  Hypoxemia EXAM: CHEST - 2 VIEW COMPARISON:  Previous studies including CT chest done on 04/04/2021 FINDINGS: Cardiac size is within normal limits. Low position of diaphragms suggesting COPD. There is slight prominence of interstitial markings in both lungs, possibly due to interstitial lung disease. There is no focal pulmonary consolidation. There is no pleural effusion or pneumothorax. IMPRESSION: COPD. There are no signs of pulmonary edema or focal pulmonary consolidation. Electronically Signed   By: Elmer Picker M.D.   On: 10/08/2021 12:20    ASSESSMENT & PLAN Dustin Alvarez 66 y.o. male with medical history significant for metastatic prostate cancer who presents for a follow up visit.  Dustin Alvarez has excessively transitioned off the Casodex and has received his first dosages of Zometa and Lupron.  His PSA continues to trend downward and is testosterone is at castrate level.  Given this we started abiraterone therapy 1000 mg p.o. daily with 5 mg of prednisone.   The major symptom the patient has been experiencing has been pain.  His spinal sacral lesion has been poorly controlled on oxycodone therapy.  He did previously receive radiation therapy in June 2021, but due to continued excruciating pain we asked for him to be  reexamined by radiation oncology to see if there is any further intervention they can offer at this time. He has had further palliative radiation, though this has not relieved his pain. Continue OxyContin to 30 mg twice daily (per his request, issues with constipation at higher doses) and additionally continue his as needed fast acting oxycodone. We will continue gabapentin at 369m BID.  We will plan to have him back in 12 weeks for clinic visit.  PSA 07/13/2019: PSA 819 08/27/2019: 226 11/05/2019: 44.8 12/08/2019: 27.3 12/27/2019: 21.2  02/24/2020: 12.1 03/22/2020: PSA 8.8 05/18/2020: PSA 4.1 07/31/2020: PSA 2.8 11/16/2020: PSA 1.6 07/18/2021: PSA 0.8  # Metastatic Castrate Sensitive Prostate Cancer, Metastatic to Bone --findings are most consistent with metastatic adenocarcinoma of the prostate.  --testosterone at last check was <3, PSA down to 0.8 ( from 819 at diagnosis)  --Administered  Lupron 22.5 mg and zometa today. Continue q 3 months.  --continue abiraterone 10076mwith prednisone 63m27mO daily. Assure he is compliant with the full dose and  steroids.  --patient due for a repeat CT scan and NM bone scan in August 2023. Repeat q 6 months or based on PSA response.  --RTC in 12 weeks for continued monitoring on Abiraterone therapy +zometa/lupron.   # Erectile Dysfunction --patient trialed on cialis and Viagra with sub-optimal results --patient requesting "something stronger" --referred to urology for assistance in managing his ED.    #Pain Control --gabapentin 600 mg BID to help with pain.  --continue oxycodone 5-63m q4H PRN for pain control. --continue Xtampza 36 mg q12H for basal pain control --established care with Radiation Oncology to help with painful bone lesions. Radiation therapy completed 02/01/2021.  --seen by neurosurgery, provided injections with no relief.  -- Met with vascular surgery regarding the concern for poor circulation of the lower extremities.  He declined  operative management.  They plan to see him back to monitor ABIs --patient established care with palliative care for management of his pain.   Orders Placed This Encounter  Procedures   CT CHEST ABDOMEN PELVIS W CONTRAST    Standing Status:   Future    Standing Expiration Date:   10/18/2022    Order Specific Question:   [facility]    [facility]   Order Specific Question:   Is Oral Contrast requested for this exam?    Answer:   Yes, Per Radiology protocol   NM Bone Scan Whole Body    Standing Status:   Future    Standing Expiration Date:   10/17/2022    Order Specific Question:   If indicated for the ordered procedure, I authorize the administration of a radiopharmaceutical per Radiology protocol    Answer:   Yes    Order Specific Question:   [facility]    [facility]   All questions were answered. The patient knows to call the clinic with any problems, questions or concerns.  A total of more than 30 minutes were spent on this encounter and over half of that time was spent on counseling and coordination of care as outlined above.   JLedell Peoples MD [facility] of Hematology/Oncology [facility]Phone: [phone]: [phone]: [email or web address]  10/17/2021 4:23 PM

## 2021-10-17 NOTE — Patient Instructions (Signed)
Zoledronic Acid Injection (Cancer) What is this medication? ZOLEDRONIC ACID (ZOE le dron ik AS id) treats high calcium levels in the blood caused by cancer. It may also be used with chemotherapy to treat weakened bones caused by cancer. It works by slowing down the release of calcium from bones. This lowers calcium levels in your blood. It also makes your bones stronger and less likely to break (fracture). It belongs to a group of medications called bisphosphonates. This medicine may be used for other purposes; ask your health care provider or pharmacist if you have questions. COMMON BRAND NAME(S): Zometa, Zometa Powder What should I tell my care team before I take this medication? They need to know if you have any of these conditions: Dehydration Dental disease Kidney disease Liver disease Low levels of calcium in the blood Lung or breathing disease, such as asthma Receiving steroids, such as dexamethasone or prednisone An unusual or allergic reaction to zoledronic acid, other medications, foods, dyes, or preservatives Pregnant or trying to get pregnant Breast-feeding How should I use this medication? This medication is injected into a vein. It is given by your care team in a hospital or clinic setting. Talk to your care team about the use of this medication in children. Special care may be needed. Overdosage: If you think you have taken too much of this medicine contact a poison control center or emergency room at once. NOTE: This medicine is only for you. Do not share this medicine with others. What if I miss a dose? Keep appointments for follow-up doses. It is important not to miss your dose. Call your care team if you are unable to keep an appointment. What may interact with this medication? Certain antibiotics given by injection Diuretics, such as bumetanide, furosemide NSAIDs, medications for pain and inflammation, such as ibuprofen or naproxen Teriparatide Thalidomide This list  may not describe all possible interactions. Give your health care provider a list of all the medicines, herbs, non-prescription drugs, or dietary supplements you use. Also tell them if you smoke, drink alcohol, or use illegal drugs. Some items may interact with your medicine. What should I watch for while using this medication? Visit your care team for regular checks on your progress. It may be some time before you see the benefit from this medication. Some people who take this medication have severe bone, joint, or muscle pain. This medication may also increase your risk for jaw problems or a broken thigh bone. Tell your care team right away if you have severe pain in your jaw, bones, joints, or muscles. Tell you care team if you have any pain that does not go away or that gets worse. Tell your dentist and dental surgeon that you are taking this medication. You should not have major dental surgery while on this medication. See your dentist to have a dental exam and fix any dental problems before starting this medication. Take good care of your teeth while on this medication. Make sure you see your dentist for regular follow-up appointments. You should make sure you get enough calcium and vitamin D while you are taking this medication. Discuss the foods you eat and the vitamins you take with your care team. Check with your care team if you have severe diarrhea, nausea, and vomiting, or if you sweat a lot. The loss of too much body fluid may make it dangerous for you to take this medication. You may need bloodwork while taking this medication. Talk to your care team if   you wish to become pregnant or think you might be pregnant. This medication can cause serious birth defects. What side effects may I notice from receiving this medication? Side effects that you should report to your care team as soon as possible: Allergic reactions--skin rash, itching, hives, swelling of the face, lips, tongue, or  throat Kidney injury--decrease in the amount of urine, swelling of the ankles, hands, or feet Low calcium level--muscle pain or cramps, confusion, tingling, or numbness in the hands or feet Osteonecrosis of the jaw--pain, swelling, or redness in the mouth, numbness of the jaw, poor healing after dental work, unusual discharge from the mouth, visible bones in the mouth Severe bone, joint, or muscle pain Side effects that usually do not require medical attention (report to your care team if they continue or are bothersome): Constipation Fatigue Fever Loss of appetite Nausea Stomach pain This list may not describe all possible side effects. Call your doctor for medical advice about side effects. You may report side effects to FDA at 1-800-FDA-1088. Where should I keep my medication? This medication is given in a hospital or clinic. It will not be stored at home. NOTE: This sheet is a summary. It may not cover all possible information. If you have questions about this medicine, talk to your doctor, pharmacist, or health care provider. Leuprolide Solution for Injection What is this medication? LEUPROLIDE (loo PROE lide) reduces the symptoms of prostate cancer. It works by decreasing levels of the hormone testosterone in the body. This prevents prostate cancer cells from spreading or growing. This medicine may be used for other purposes; ask your health care provider or pharmacist if you have questions. COMMON BRAND NAME(S): Lupron What should I tell my care team before I take this medication? They need to know if you have any of these conditions: Diabetes Heart attack Heart disease High blood pressure High cholesterol Pain or difficulty passing urine Spinal cord metastasis Stroke Tobacco use An unusual or allergic reaction to leuprolide, other medications, foods, dyes, or preservatives Pregnant or trying to get pregnant Breast-feeding How should I use this medication? This medication  is for injection under the skin or into a muscle. You will be taught how to prepare and give this medication. Use exactly as directed. Take your medication at regular intervals. Do not take it more often than directed. It is important that you put your used needles and syringes in a special sharps container. Do not put them in a trash can. If you do not have a sharps container, call your care team to get one. A special MedGuide will be given to you by the pharmacist with each prescription and refill. Be sure to read this information carefully each time. Talk to your care team about the use of this medication in children. While this medication may be prescribed for children as young as 8 years for selected conditions, precautions do apply. Overdosage: If you think you have taken too much of this medicine contact a poison control center or emergency room at once. NOTE: This medicine is only for you. Do not share this medicine with others. What if I miss a dose? If you miss a dose, take it as soon as you can. If it is almost time for your next dose, take only that dose. Do not take double or extra doses. What may interact with this medication? Do not take this medication with any of the following: Chasteberry Cisapride Dronedarone Pimozide Thioridazine This medication may also interact with the following:   Estrogen or progestin hormones Herbal or dietary supplements, like black cohosh or DHEA Other medications that cause heart rhythm changes Testosterone This list may not describe all possible interactions. Give your health care provider a list of all the medicines, herbs, non-prescription drugs, or dietary supplements you use. Also tell them if you smoke, drink alcohol, or use illegal drugs. Some items may interact with your medicine. What should I watch for while using this medication? Visit your care team for regular checks on your progress. During the first week, your symptoms may get worse,  but then will improve as you continue your treatment. You may get hot flashes, increased bone pain, increased difficulty passing urine, or an aggravation of nerve symptoms. Discuss these effects with your care team, some of them may improve with continued use of this medication. Patients may experience a menstrual cycle or spotting during the first 2 months of therapy with this medication. If this continues, contact your care team. This medication may increase blood sugar. The risk may be higher in patients who already have diabetes. Ask your care team what you can do to lower your risk of diabetes while taking this medication. What side effects may I notice from receiving this medication? Side effects that you should report to your care team as soon as possible: Allergic reactions--skin rash, itching, hives, swelling of the face, lips, tongue, or throat Heart attack--pain or tightness in the chest, shoulders, arms, or jaw, nausea, shortness of breath, cold or clammy skin, feeling faint or lightheaded Heart rhythm changes--fast or irregular heartbeat, dizziness, feeling faint or lightheaded, chest pain, trouble breathing High blood sugar (hyperglycemia)--increased thirst or amount of urine, unusual weakness or fatigue, blurry vision Mood swings, irritability, hostility Seizures Stroke--sudden numbness or weakness of the face, arm, or leg, trouble speaking, confusion, trouble walking, loss of balance or coordination, dizziness, severe headache, change in vision Thoughts of suicide or self-harm, worsening mood, feelings of depression Side effects that usually do not require medical attention (report to your care team if they continue or are bothersome): Bone pain Change in sex drive or performance General discomfort and fatigue Hot flashes Muscle pain Pain, redness, or irritation at injection site Swelling of the ankles, hands, or feet This list may not describe all possible side effects. Call  your doctor for medical advice about side effects. You may report side effects to FDA at 1-800-FDA-1088. Where should I keep my medication? Keep out of the reach of children and pets. Store below 25 degrees C (77 degrees F). Do not freeze. Protect from light. Get rid of any unused medication after the expiration date. To get rid of medications that are no longer needed or have expired: Take the medication to a medication take-back program. Check with your pharmacy or law enforcement to find a location. If you cannot return the medication, ask your pharmacist or care team how to get rid of this medication safely. NOTE: This sheet is a summary. It may not cover all possible information. If you have questions about this medicine, talk to your doctor, pharmacist, or health care provider.  2023 Elsevier/Gold Standard (2020-12-29 00:00:00)   2023 Elsevier/Gold Standard (2021-03-15 00:00:00) 

## 2021-10-17 NOTE — Progress Notes (Signed)
Dustin Alvarez  [phone] [phone]   Name: Dustin Alvarez Date: 10/17/2021 MRN: [id number]  DOB: 1955/09/06  Patient Care Team: Dustin Gordon, DO as PCP - General (Internal Medicine) Dustin Rue, RN Nurse Navigator as Registered Nurse (Medical Oncology) Dustin Alvarez, Dustin Sax, NP as Nurse Practitioner (Nurse Practitioner)     REASON FOR CONSULTATION: Dustin Alvarez is a 66 y.o. 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  Palliative ask to see for symptom management and goals of care.    SOCIAL HISTORY:     reports that he has been smoking cigarettes. He has a 53.00 pack-year smoking history. He has never used smokeless tobacco. He reports current alcohol use. He reports that he does not currently use drugs.  ADVANCE DIRECTIVES:  Patient reports his children would be his medical decision makers. Does have MOST on file. Personally reviewed an discussed with no requested changes.    CODE STATUS: DNR  PAST MEDICAL HISTORY: Past Medical History:  Diagnosis Date   Asthma    COPD (chronic obstructive pulmonary disease) (HCC)    GERD (gastroesophageal reflux disease)    Hypertension    Prostate cancer ([place])     PAST SURGICAL HISTORY:  Past Surgical History:  Procedure Laterality Date   ABDOMINAL AORTOGRAM W/LOWER EXTREMITY N/A 10/26/2020   Procedure: ABDOMINAL AORTOGRAM W/LOWER EXTREMITY;  Surgeon: Dustin Heck, MD;  Location: [facility];  Service: Cardiovascular;  Laterality: N/A;   COLONOSCOPY WITH PROPOFOL N/A 05/22/2021   Procedure: COLONOSCOPY WITH PROPOFOL;  Surgeon: Dustin Stabler, MD;  Location: [facility];  Service: Gastroenterology;  Laterality: N/A;   ESOPHAGOGASTRODUODENOSCOPY (EGD) WITH PROPOFOL N/A 05/22/2021    Procedure: ESOPHAGOGASTRODUODENOSCOPY (EGD) WITH PROPOFOL;  Surgeon: Dustin Stabler, MD;  Location: [facility];  Service: Gastroenterology;  Laterality: N/A;   HEMOSTASIS CLIP PLACEMENT  05/22/2021   Procedure: HEMOSTASIS CLIP PLACEMENT;  Surgeon: Dustin Stabler, MD;  Location: [facility];  Service: Gastroenterology;;   HOT HEMOSTASIS N/A 05/22/2021   Procedure: HOT HEMOSTASIS (ARGON PLASMA COAGULATION/BICAP);  Surgeon: Dustin Stabler, MD;  Location: [facility];  Service: Gastroenterology;  Laterality: N/A;   WRIST SURGERY Left 2017    HEMATOLOGY/ONCOLOGY HISTORY:  Oncology History   No history exists.    ALLERGIES:  is allergic to ace inhibitors and lisinopril.  MEDICATIONS:  Current Outpatient Medications  Medication Sig Dispense Refill   abiraterone acetate (ZYTIGA) 250 MG tablet TAKE 4 TABLETS BY MOUTH DAILY. TAKE ON AN EMPTY STOMACH 1 HOUR BEFORE OR 2 HOURS AFTER A MEAL 120 tablet 2   albuterol (PROVENTIL) (2.5 MG/3ML) 0.083% nebulizer solution Take 3 mLs (2.5 mg total) by nebulization every 6 (six) hours as needed for wheezing or shortness of breath. 180 mL 5   albuterol (VENTOLIN HFA) 108 (90 Base) MCG/ACT inhaler INHALE TWO puffs into THE lungs EVERY SIX HOURS AS NEEDED wheezing OR For SHORTNESS OF BREATH 6.7 g 3   amLODipine (NORVASC) 5 MG tablet Take 1 tablet (5 mg total) by mouth daily. 90 tablet 1   calcium-vitamin D (OSCAL WITH D) 500-200 MG-UNIT tablet Take 1 tablet by mouth daily with breakfast. 90 tablet 3   docusate sodium (COLACE) 100 MG capsule Take 200 mg by mouth daily.     finasteride (PROSCAR) 5 MG tablet Take 1 tablet (5 mg  total) by mouth daily. 90 tablet 3   Fluticasone-Umeclidin-Vilant (TRELEGY ELLIPTA) 200-62.5-25 MCG/ACT AEPB Inhale 1 puff into the lungs daily. Use once daily 60 each 11   gabapentin (NEURONTIN) 300 MG capsule Take 2 capsules (600 mg total) by mouth 2 (two) times daily. 120 capsule 2   lidocaine (LIDODERM) 5 % Place 1 patch onto  the skin daily. Remove & Discard patch within 12 hours or as directed by MD 30 patch 0   nicotine (NICOTROL) 10 MG inhaler Inhale 1 continuous puffing into the lungs as needed for smoking cessation.     oxyCODONE (OXY IR/ROXICODONE) 5 MG immediate release tablet Take 1 - 2 tablets by mouth every 4 hours as needed for severe, breakthrough or moderate pain. 180 tablet 0   oxyCODONE ER (XTAMPZA ER) 36 MG C12A Take 1 capsule (36 mg total) by mouth every 8 (eight) hours. 90 capsule 0   pantoprazole (PROTONIX) 40 MG tablet Take 1 tablet (40 mg total) by mouth daily. 90 tablet 3   polyethylene glycol (MIRALAX) 17 g packet Mix 17 grams (1 packet) with 4-8 ounces of liquid and take by mouth 2 (two) times daily. 60 each 2   predniSONE (DELTASONE) 10 MG tablet Take 4 tablets (40 mg total) by mouth daily with breakfast. 20 tablet 0   predniSONE (DELTASONE) 5 MG tablet y1y BY MOUTH EVERY DAY with breakfast .**Do not take prior TO taking Abiraterone** 90 tablet 1   senna-docusate (SENNA S) 8.6-50 MG tablet Take 2 tablets by mouth daily. 60 tablet 2   sildenafil (VIAGRA) 100 MG tablet Take 1 tablet (100 mg total) by mouth daily as needed for erectile dysfunction. 30 tablet 0   sorbitol 70 % SOLN Take 15 mLs by mouth daily as needed. 473 mL 0   tamsulosin (FLOMAX) 0.4 MG CAPS capsule Take 1 capsule (0.4 mg total) by mouth in the morning and at bedtime. 180 capsule 0   traZODone (DESYREL) 100 MG tablet Take 0.5 tablets (50 mg total) by mouth at bedtime. 90 tablet 1   No current facility-administered medications for this visit.    VITAL SIGNS: There were no vitals taken for this visit. There were no vitals filed for this visit.   Estimated body mass index is 20.08 kg/m as calculated from the following:   Height as of 10/08/21: '5\' 11"'$  (1.803 m).   Weight as of an earlier encounter on 10/17/21: 144 lb (65.3 kg).   PERFORMANCE STATUS (ECOG) : 1 - Symptomatic but completely ambulatory  IMPRESSION: Dustin Alvarez  and his daughter presents to the clinic today for follow-up on symptom management. No acute distress noted. Reports he has been feeling well. No acute changes.    1.  Neoplasm related pain Dustin Alvarez states pain is well controlled on current regimen. Does not have to take Oxy IR around the clock.   We reviewed at length his current pain regimen which consists of Xtampza 36 mg every 8 hours, Oxy IR 5-10 mg every 4 hours as needed for breakthrough pain, and gabapentin 600 mg twice daily. Per patient and daughter he is taking all medications as prescribed.   We will plan to continue to monitor and make adjustments as needed.   2.  Constipation Constipation much improved on regimen. He is taking Senna twice daily. Also has sorbitol available if needed. Is having daily bowel movements per patient and daughter. Encouraged to continue with bowel regimen.    PLAN:    Xtampza 36 mg to  every 8 hours. Tolerating well. Pain controlled.  5-10 mg Oxy IR every 4 hours as needed for breakthrough pain as prescribed Gabapentin 600 mg twice daily.   Lidocaine patch to lower back Sorbitol as needed for constipation Senna-S twice daily  Ongoing goals of care discussion.  I will plan to see patient back in 4-6 weeks in collaboration to other oncology appointments.    Patient expressed understanding and was in agreement with this plan. He also understands that He can call the clinic at any time with any questions, concerns, or complaints.   Thank you for your referral and allowing Palliative to assist in Mr. Randale Carvalho Pokorney's care.      Any controlled substances utilized were prescribed in the context of palliative care. PDMP has been reviewed.    Time Total: 35 min   Visit consisted of counseling and education dealing with the complex and emotionally intense issues of symptom management and palliative care in the setting of serious and potentially life-threatening illness.Greater than 50%  of this  time was spent counseling and coordinating care related to the above assessment and plan.  Alda Lea, AGPCNP-BC  Palliative Medicine Team/[facility]

## 2021-10-17 NOTE — Progress Notes (Signed)
Two Rns attempted to stick pt for IV access. Pt requested to reschedule for next day. Charge RN rescheduled pt. Zometa and Leuprolide not given.

## 2021-10-18 ENCOUNTER — Other Ambulatory Visit (HOSPITAL_COMMUNITY): Payer: Self-pay

## 2021-10-18 ENCOUNTER — Inpatient Hospital Stay: Payer: Medicare Other

## 2021-10-18 VITALS — BP 138/77 | HR 88 | Resp 16

## 2021-10-18 DIAGNOSIS — C7951 Secondary malignant neoplasm of bone: Secondary | ICD-10-CM

## 2021-10-18 DIAGNOSIS — Z515 Encounter for palliative care: Secondary | ICD-10-CM | POA: Diagnosis not present

## 2021-10-18 LAB — TESTOSTERONE: Testosterone: <3 ng/dL — ABNORMAL LOW (ref 264–916)

## 2021-10-18 LAB — PROSTATE-SPECIFIC AG, SERUM (LABCORP): Prostate Specific Ag, Serum: 1 ng/mL (ref 0.0–4.0)

## 2021-10-18 MED ORDER — ZOLEDRONIC ACID 4 MG/100ML IV SOLN
4.0000 mg | Freq: Once | INTRAVENOUS | Status: AC
  Administered 2021-10-18: 4 mg via INTRAVENOUS
  Filled 2021-10-18: qty 100

## 2021-10-18 MED ORDER — LEUPROLIDE ACETATE (3 MONTH) 22.5 MG ~~LOC~~ KIT
22.5000 mg | PACK | Freq: Once | SUBCUTANEOUS | Status: AC
  Administered 2021-10-18: 22.5 mg via SUBCUTANEOUS
  Filled 2021-10-18: qty 22.5

## 2021-10-20 ENCOUNTER — Other Ambulatory Visit: Payer: Self-pay | Admitting: Internal Medicine

## 2021-10-20 ENCOUNTER — Other Ambulatory Visit (HOSPITAL_COMMUNITY): Payer: Self-pay

## 2021-10-24 ENCOUNTER — Other Ambulatory Visit (HOSPITAL_COMMUNITY): Payer: Self-pay

## 2021-10-25 ENCOUNTER — Other Ambulatory Visit (HOSPITAL_COMMUNITY): Payer: Self-pay

## 2021-10-26 ENCOUNTER — Other Ambulatory Visit: Payer: Self-pay

## 2021-10-26 DIAGNOSIS — K5903 Drug induced constipation: Secondary | ICD-10-CM

## 2021-10-26 DIAGNOSIS — Z515 Encounter for palliative care: Secondary | ICD-10-CM

## 2021-10-26 DIAGNOSIS — G893 Neoplasm related pain (acute) (chronic): Secondary | ICD-10-CM

## 2021-10-26 DIAGNOSIS — C61 Malignant neoplasm of prostate: Secondary | ICD-10-CM

## 2021-10-26 MED ORDER — XTAMPZA ER 36 MG PO C12A: 36.0000 mg | EXTENDED_RELEASE_CAPSULE | Freq: Three times a day (TID) | ORAL | 0 refills | Status: DC

## 2021-10-29 ENCOUNTER — Other Ambulatory Visit (HOSPITAL_COMMUNITY): Payer: Self-pay

## 2021-11-01 ENCOUNTER — Encounter (HOSPITAL_COMMUNITY)
Admission: RE | Admit: 2021-11-01 | Discharge: 2021-11-01 | Disposition: A | Discharge status: Home / Self Care | Payer: Medicare Other | Source: Ambulatory Visit | Attending: Hematology and Oncology | Admitting: Hematology and Oncology

## 2021-11-01 ENCOUNTER — Ambulatory Visit (HOSPITAL_COMMUNITY)
Admission: RE | Admit: 2021-11-01 | Discharge: 2021-11-01 | Disposition: A | Discharge status: Home / Self Care | Payer: Medicare Other | Source: Ambulatory Visit | Attending: Hematology and Oncology | Admitting: Hematology and Oncology

## 2021-11-01 DIAGNOSIS — C7951 Secondary malignant neoplasm of bone: Secondary | ICD-10-CM | POA: Diagnosis not present

## 2021-11-01 DIAGNOSIS — C61 Malignant neoplasm of prostate: Secondary | ICD-10-CM | POA: Insufficient documentation

## 2021-11-01 DIAGNOSIS — S2232XD Fracture of one rib, left side, subsequent encounter for fracture with routine healing: Secondary | ICD-10-CM | POA: Diagnosis not present

## 2021-11-01 DIAGNOSIS — N281 Cyst of kidney, acquired: Secondary | ICD-10-CM | POA: Diagnosis not present

## 2021-11-01 DIAGNOSIS — S3210XA Unspecified fracture of sacrum, initial encounter for closed fracture: Secondary | ICD-10-CM | POA: Diagnosis not present

## 2021-11-01 DIAGNOSIS — J439 Emphysema, unspecified: Secondary | ICD-10-CM | POA: Diagnosis not present

## 2021-11-01 DIAGNOSIS — I3139 Other pericardial effusion (noninflammatory): Secondary | ICD-10-CM | POA: Diagnosis not present

## 2021-11-01 MED ORDER — IOHEXOL 9 MG/ML PO SOLN
ORAL | Status: AC
  Filled 2021-11-01: qty 1000

## 2021-11-01 MED ORDER — IOHEXOL 300 MG/ML  SOLN
100.0000 mL | Freq: Once | INTRAMUSCULAR | Status: AC | PRN
  Administered 2021-11-01: 100 mL via INTRAVENOUS

## 2021-11-01 MED ORDER — TECHNETIUM TC 99M MEDRONATE IV KIT
20.3000 | PACK | Freq: Once | INTRAVENOUS | Status: AC
  Administered 2021-11-01: 20.3 via INTRAVENOUS

## 2021-11-01 MED ORDER — SODIUM CHLORIDE (PF) 0.9 % IJ SOLN
INTRAMUSCULAR | Status: AC
  Filled 2021-11-01: qty 50

## 2021-11-02 ENCOUNTER — Other Ambulatory Visit (HOSPITAL_COMMUNITY): Payer: Self-pay

## 2021-11-02 ENCOUNTER — Other Ambulatory Visit: Payer: Self-pay | Admitting: Nurse Practitioner

## 2021-11-02 DIAGNOSIS — K5903 Drug induced constipation: Secondary | ICD-10-CM

## 2021-11-02 DIAGNOSIS — C7951 Secondary malignant neoplasm of bone: Secondary | ICD-10-CM

## 2021-11-02 DIAGNOSIS — G893 Neoplasm related pain (acute) (chronic): Secondary | ICD-10-CM

## 2021-11-02 DIAGNOSIS — Z515 Encounter for palliative care: Secondary | ICD-10-CM

## 2021-11-03 ENCOUNTER — Other Ambulatory Visit (HOSPITAL_COMMUNITY): Payer: Self-pay

## 2021-11-05 ENCOUNTER — Telehealth: Payer: Self-pay

## 2021-11-05 ENCOUNTER — Encounter: Payer: Self-pay | Admitting: Hematology and Oncology

## 2021-11-05 ENCOUNTER — Other Ambulatory Visit: Payer: Self-pay | Admitting: Nurse Practitioner

## 2021-11-05 ENCOUNTER — Other Ambulatory Visit (HOSPITAL_COMMUNITY): Payer: Self-pay

## 2021-11-05 DIAGNOSIS — C61 Malignant neoplasm of prostate: Secondary | ICD-10-CM

## 2021-11-05 DIAGNOSIS — G893 Neoplasm related pain (acute) (chronic): Secondary | ICD-10-CM

## 2021-11-05 DIAGNOSIS — Z515 Encounter for palliative care: Secondary | ICD-10-CM

## 2021-11-05 DIAGNOSIS — K5903 Drug induced constipation: Secondary | ICD-10-CM

## 2021-11-05 MED ORDER — XTAMPZA ER 36 MG PO C12A
36.0000 mg | EXTENDED_RELEASE_CAPSULE | Freq: Three times a day (TID) | ORAL | 0 refills | Status: DC
  Filled 2021-11-05: qty 90, 30d supply, fill #0

## 2021-11-05 NOTE — Telephone Encounter (Signed)
Spoke with pt daughter, refill re-sent to correct pharmacy, no further needs at this time

## 2021-11-06 ENCOUNTER — Ambulatory Visit: Payer: Medicare Other | Admitting: Vascular Surgery

## 2021-11-07 ENCOUNTER — Telehealth: Payer: Self-pay | Admitting: *Deleted

## 2021-11-07 NOTE — Telephone Encounter (Signed)
TCT Patient's daughter, [name]. Spoke to her and advised that her father's CT scan showed no progression of his cancer and no new disease. Advised that his current treatment is working well and we will keep continuing that treatment. She voiced understanding. She will let Mr. Stanek know of these results.

## 2021-11-07 NOTE — Telephone Encounter (Signed)
-----   Message from [name], MD sent at 11/07/2021 12:55 PM EDT ----- Please let Dustin Alvarez know that the CT scan showed no progression or no new areas of disease.  Currently his medication appears to be controlling his cancer.  We will continue on her current treatment regimen. ----- Message ----- From: Interface, Rad Results In Sent: 11/02/2021   1:33 PM EDT To: [name], MD

## 2021-11-08 ENCOUNTER — Other Ambulatory Visit (HOSPITAL_COMMUNITY): Payer: Self-pay

## 2021-11-09 DIAGNOSIS — J432 Centrilobular emphysema: Secondary | ICD-10-CM | POA: Diagnosis not present

## 2021-11-13 ENCOUNTER — Ambulatory Visit (INDEPENDENT_AMBULATORY_CARE_PROVIDER_SITE_OTHER): Payer: Medicare Other | Admitting: Vascular Surgery

## 2021-11-13 ENCOUNTER — Encounter: Payer: Self-pay | Admitting: Vascular Surgery

## 2021-11-13 ENCOUNTER — Other Ambulatory Visit: Payer: Self-pay

## 2021-11-13 VITALS — BP 120/73 | HR 91 | Temp 97.5°F | Resp 18 | Ht 70.0 in | Wt 134.0 lb

## 2021-11-13 DIAGNOSIS — I739 Peripheral vascular disease, unspecified: Secondary | ICD-10-CM

## 2021-11-13 NOTE — Progress Notes (Signed)
Patient name: Dustin Alvarez MRN: [id number] DOB: [birth year] Sex: male  REASON FOR CONSULT: 3 month follow-up, PAD  HPI: Dustin Alvarez is a 66 y.o. male, with history of metastatic prostate cancer and COPD who presents for 3 month follow-up.  He was initally referred by Dr. Lorenso Courier with hem onc.  He had a CT on 10/09/2020 with interval occlusion of the left SFA but patent profunda.  We performed left leg angiogram last year and then recommended a left common femoral to above-knee popliteal bypass.  This was going to be with prosthetic graft given he had no saphenous vein or other upper arm vein for conduit.  Patient canceled his bypass and did not want a plastic bypass.  3 months ago he was complaining of mainly lower extremity swelling and we put him in compression stockings.  Today he complains of more pain in the right leg that feels like his leg is asleep.  States the swelling is much better.  He has a hard time describing his left leg symptoms.  Still smoking 1/2 PPD.  Past Medical History:  Diagnosis Date   Asthma    COPD (chronic obstructive pulmonary disease) (HCC)    GERD (gastroesophageal reflux disease)    Hypertension    Prostate cancer [facility])     Past Surgical History:  Procedure Laterality Date   ABDOMINAL AORTOGRAM W/LOWER EXTREMITY N/A 10/26/2020   Procedure: ABDOMINAL AORTOGRAM W/LOWER EXTREMITY;  Surgeon: Marty Heck, MD;  Location: [place] [facility];  Service: Cardiovascular;  Laterality: N/A;   COLONOSCOPY WITH PROPOFOL N/A 05/22/2021   Procedure: COLONOSCOPY WITH PROPOFOL;  Surgeon: Doran Stabler, MD;  [facility];  Service: Gastroenterology;  Laterality: N/A;   ESOPHAGOGASTRODUODENOSCOPY (EGD) WITH PROPOFOL N/A 05/22/2021   Procedure: ESOPHAGOGASTRODUODENOSCOPY (EGD) WITH PROPOFOL;  Surgeon: Doran Stabler, MD;  [facility]: [facility];  Service: Gastroenterology;  Laterality: N/A;   HEMOSTASIS CLIP PLACEMENT  05/22/2021   Procedure:  HEMOSTASIS CLIP PLACEMENT;  Surgeon: Doran Stabler, MD;  [facility];  Service: Gastroenterology;;   HOT HEMOSTASIS N/A 05/22/2021   Procedure: HOT HEMOSTASIS (ARGON PLASMA COAGULATION/BICAP);  Surgeon: Doran Stabler, MD;  Location: [facility];  Service: Gastroenterology;  Laterality: N/A;   WRIST SURGERY Left 2017    Family History  Problem Relation Age of Onset   COPD Father    Diabetes Sister    Diabetes Brother    Breast cancer Neg Hx    Prostate cancer Neg Hx    Colon cancer Neg Hx    Pancreatic cancer Neg Hx    Stomach cancer Neg Hx    Esophageal cancer Neg Hx     SOCIAL HISTORY: Social History   Socioeconomic History   Marital status: Widowed    Spouse name: Not on file   Number of children: Not on file   Years of education: Not on file   Highest education level: Not on file  Occupational History   Not on file  Tobacco Use   Smoking status: Some Days    Packs/day: 1.00    Years: 53.00    Total pack years: 53.00    Types: Cigarettes   Smokeless tobacco: Never   Tobacco comments:    0.5 PPD/Wants patches   Vaping Use   Vaping Use: Never used  Substance and Sexual Activity   Alcohol use: Yes    Comment: 1-2 drinks per week.   Drug use: Not Currently   Sexual  activity: Not Currently  Other Topics Concern   Not on file  Social History Narrative   Not on file   Social Determinants of Health   Financial Resource Strain: Not on file  Food Insecurity: Not on file  Transportation Needs: Not on file  Physical Activity: Not on file  Stress: Not on file  Social Connections: Not on file  Intimate Partner Violence: Not on file    Allergies  Allergen Reactions   Ace Inhibitors Swelling    lisinopril   Lisinopril Swelling    Current Outpatient Medications  Medication Sig Dispense Refill   abiraterone acetate (ZYTIGA) 250 MG tablet TAKE 4 TABLETS BY MOUTH DAILY. TAKE ON AN EMPTY STOMACH 1 HOUR BEFORE OR 2 HOURS AFTER A MEAL 120 tablet  2   albuterol (PROVENTIL) (2.5 MG/3ML) 0.083% nebulizer solution Take 3 mLs (2.5 mg total) by nebulization every 6 (six) hours as needed for wheezing or shortness of breath. 180 mL 5   albuterol (VENTOLIN HFA) 108 (90 Base) MCG/ACT inhaler INHALE TWO puffs into THE lungs EVERY SIX HOURS AS NEEDED wheezing OR For SHORTNESS OF BREATH 6.7 g 3   amLODipine (NORVASC) 5 MG tablet Take 1 tablet (5 mg total) by mouth daily. 90 tablet 1   calcium-vitamin D (OSCAL WITH D) 500-200 MG-UNIT tablet Take 1 tablet by mouth daily with breakfast. 90 tablet 3   docusate sodium (COLACE) 100 MG capsule Take 200 mg by mouth daily.     finasteride (PROSCAR) 5 MG tablet Take 1 tablet (5 mg total) by mouth daily. 90 tablet 3   Fluticasone-Umeclidin-Vilant (TRELEGY ELLIPTA) 200-62.5-25 MCG/ACT AEPB Inhale 1 puff into the lungs daily. Use once daily 60 each 11   gabapentin (NEURONTIN) 300 MG capsule Take 2 capsules (600 mg total) by mouth 2 (two) times daily. 120 capsule 2   lidocaine (LIDODERM) 5 % Place 1 patch onto the skin daily. Remove & Discard patch within 12 hours or as directed by MD 30 patch 0   nicotine (NICOTROL) 10 MG inhaler Inhale 1 continuous puffing into the lungs as needed for smoking cessation.     oxyCODONE (OXY IR/ROXICODONE) 5 MG immediate release tablet Take 1 - 2 tablets by mouth every 4 hours as needed for severe, breakthrough or moderate pain. 180 tablet 0   oxyCODONE ER (XTAMPZA ER) 36 MG C12A Take 1 capsule (36 mg total) by mouth every 8 (eight) hours. 90 capsule 0   pantoprazole (PROTONIX) 40 MG tablet Take 1 tablet (40 mg total) by mouth daily. 90 tablet 3   polyethylene glycol (MIRALAX) 17 g packet Mix 17 grams (1 packet) with 4-8 ounces of liquid and take by mouth 2 (two) times daily. 60 each 2   predniSONE (DELTASONE) 10 MG tablet Take 4 tablets (40 mg total) by mouth daily with breakfast. 20 tablet 0   senna-docusate (SENNA S) 8.6-50 MG tablet Take 2 tablets by mouth daily. 60 tablet 2    sildenafil (VIAGRA) 100 MG tablet Take 1 tablet (100 mg total) by mouth daily as needed for erectile dysfunction. 30 tablet 0   sorbitol 70 % SOLN Take 15 mLs by mouth daily as needed. 473 mL 0   tamsulosin (FLOMAX) 0.4 MG CAPS capsule Take 1 capsule (0.4 mg total) by mouth in the morning and at bedtime. 180 capsule 0   traZODone (DESYREL) 100 MG tablet Take 0.5 tablets (50 mg total) by mouth at bedtime. 90 tablet 1   predniSONE (DELTASONE) 5 MG tablet y1y BY  MOUTH EVERY DAY with breakfast .**Do not take prior TO taking Abiraterone** (Patient not taking: Reported on 11/13/2021) 90 tablet 1   No current facility-administered medications for this visit.    REVIEW OF SYSTEMS:  '[X]'$  denotes positive finding, '[ ]'$  denotes negative finding Cardiac  Comments:  Chest pain or chest pressure:    Shortness of breath upon exertion:    Short of breath when lying flat:    Irregular heart rhythm:        Vascular    Pain in calf, thigh, or hip brought on by ambulation:    Pain in feet at night that wakes you up from your sleep:     Blood clot in your veins:    Leg swelling:         Pulmonary    Oxygen at home:    Productive cough:     Wheezing:         Neurologic    Sudden weakness in arms or legs:     Sudden numbness in arms or legs:     Sudden onset of difficulty speaking or slurred speech:    Temporary loss of vision in one eye:     Problems with dizziness:         Gastrointestinal    Blood in stool:     Vomited blood:         Genitourinary    Burning when urinating:     Blood in urine:        Psychiatric    Major depression:         Hematologic    Bleeding problems:    Problems with blood clotting too easily:        Skin    Rashes or ulcers:        Constitutional    Fever or chills:      PHYSICAL EXAM: Vitals:   11/13/21 1032  BP: 120/73  Pulse: 91  Resp: 18  Temp: (!) 97.5 F (36.4 C)  TempSrc: Temporal  SpO2: (!) 87%  Weight: 134 lb (60.8 kg)  Height: '5\' 10"'$   (1.778 m)    GENERAL: The patient is a well-nourished male, in no acute distress. The vital signs are documented above. CARDIAC: There is a regular rate and rhythm.  VASCULAR:  Palpable femoral pulses bilaterally Right DP palpable Left pedal pulses nonpalpable No lower extremity tissue loss PULMONARY: No respiratory distress. ABDOMEN: Soft and non-tender. MUSCULOSKELETAL: There are no major deformities or cyanosis. NEUROLOGIC: No focal weakness or paresthesias are detected. PSYCHIATRIC: The patient has a normal affect.   DATA:   ABIs 08/07/21 were 0.95 right triphasic and 0.56 left monophasic   Assessment/Plan:  66 year old male previously seen last year with left leg pain and underwent angiogram showing near flush left SFA occlusion and we recommended left common femoral to above-knee popliteal bypass.  Patient ultimately canceled the surgery when I discussed this would be with a plastic graft given no long segment vein for conduit.  He presents for continued interval 74-monthfollow-up.  Today he is complaining of more pain in the right leg and feels like it is asleep.  I discussed he has a normal ABI in the right lower extremity and has a palpable pedal pulse and no significant arterial disease in the right leg.  He seems fixated on needing surgery and I again discussed this was for his left leg but I see no indication for surgical intervention again given predominant right leg complaints today.  I also discussed a plastic bypass in his left leg with him still smoking will have very poor durability.  I will see him again in 3 months.  I discussed walking therapies.   Marty Heck, MD [facility] Office: [phone]

## 2021-11-14 ENCOUNTER — Ambulatory Visit (INDEPENDENT_AMBULATORY_CARE_PROVIDER_SITE_OTHER): Payer: Medicare Other | Admitting: Podiatry

## 2021-11-14 ENCOUNTER — Ambulatory Visit (INDEPENDENT_AMBULATORY_CARE_PROVIDER_SITE_OTHER): Payer: Medicare Other

## 2021-11-14 ENCOUNTER — Encounter: Payer: Self-pay | Admitting: Podiatry

## 2021-11-14 DIAGNOSIS — L89620 Pressure ulcer of left heel, unstageable: Secondary | ICD-10-CM

## 2021-11-14 DIAGNOSIS — I739 Peripheral vascular disease, unspecified: Secondary | ICD-10-CM | POA: Diagnosis not present

## 2021-11-14 DIAGNOSIS — L84 Corns and callosities: Secondary | ICD-10-CM | POA: Diagnosis not present

## 2021-11-14 DIAGNOSIS — B351 Tinea unguium: Secondary | ICD-10-CM | POA: Diagnosis not present

## 2021-11-14 DIAGNOSIS — M79675 Pain in left toe(s): Secondary | ICD-10-CM | POA: Diagnosis not present

## 2021-11-14 DIAGNOSIS — L97521 Non-pressure chronic ulcer of other part of left foot limited to breakdown of skin: Secondary | ICD-10-CM

## 2021-11-14 DIAGNOSIS — M79674 Pain in right toe(s): Secondary | ICD-10-CM

## 2021-11-14 MED ORDER — SILVER SULFADIAZINE 1 % EX CREA: TOPICAL_CREAM | CUTANEOUS | 1 refills | Status: DC

## 2021-11-14 NOTE — Progress Notes (Signed)
Subjective:  Patient ID: Dustin Alvarez, male    DOB: Jul 04, 1955,  MRN: [id number]  Dustin Alvarez presents to clinic today for:  Chief Complaint  Patient presents with   Nail Problem    Routine nail care, Nail and callus trim,  left hallux is sore from the last nail trim, also has 2 callus on the left foot, the the left heel is sore.    Dustin Alvarez is seen for at risk foot care. Patient has h/o PAD and callus(es) left lower extremity and painful thick toenails that are difficult to trim. Painful toenails interfere with ambulation. Aggravating factors include wearing enclosed shoe gear. Pain is relieved with periodic professional debridement. Painful calluses are aggravated when weightbearing with and without shoegear. Pain is relieved with periodic professional debridement.  Patient is accompanied by his daughter, Dustin Alvarez, on today's visit. Dustin Alvarez states his left great toe was nicked on last visit and some medication was applied to it. He relates continued discomfort of digit. Denies any drainage or swelling, but toe is a little discolored on the tip of the digit.  He also relates soreness on side and back of left heel. Heel is also discolored (darkened). He is a back sleeper. He denies any redness, drainage, blister formation or swelling.  PCP is Dustin Alvarez, Dustin Alvarez.  Allergies  Allergen Reactions   Ace Inhibitors Swelling    lisinopril   Lisinopril Swelling    Review of Systems: Negative except as noted in the HPI.  Objective: No changes noted in today's physical examination.  Dustin Alvarez is a pleasant 66 y.o. male WD, WN in NAD. AAO x 3.  Vascular Examination: CFT delayed b/l. DP pulses diminished LLE. DP pulses faintly palpable RLE.  PT pulses diminished b/l. Digital hair absent. Skin temperature gradient warm to cool b/l. No ischemia or gangrene. No cyanosis or clubbing noted b/l.    Neurological Examination: Sensation  grossly intact b/l with 10 gram monofilament. Vibratory sensation intact b/l.   Dermatological Examination: Pedal skin thin, shiny and atrophic b/l. Toenails 1-5 b/l thick, discolored, elongated with subungual debris and pain on dorsal palpation.  No interdigital macerations noted b/l LE.   Left great toe medial border has hyperkeratotic lesion with underlying superficial nail border ulceration. There is tenderness to palpation. No erythema, no edema, no fluctuance. No purulent drainage. There is no bone palpable.There is good bright red blood with debridement.  Preulcerative lesion noted submet head 5 left foot and sub 5th met base left lower extremity. There is visible subdermal hemorrhage. There is no surrounding erythema, no edema, no drainage, no odor, no fluctuance.  He does have hyperpigmented area of skin posterolateral heel LLE. No fluctuance. No warmth. No abnormal coolness.  Musculoskeletal Examination: Muscle strength 5/5 to b/l LE. Pes planus deformity noted bilateral LE.  Radiographs: Left foot No gas in tissues left foot. No bone erosion noted at location of ulceration left great toe. No evidence of fracture left foot. No foreign body evident left foot.  Assessment/Plan: 1. Pain due to onychomycosis of toenails of both feet   2. Skin ulcer of toe of left foot, limited to breakdown of skin ([place])   3. Pressure injury of left heel, unstageable ([place])   4. Pre-ulcerative calluses   5. PAD (peripheral artery disease) (HCC)     Meds ordered this encounter  Medications   silver sulfADIAZINE (SILVADENE) 1 % cream    Sig: Apply to left  great toe once daily.    Dispense:  50 g    Refill:  1    -Patient's family member present. All questions/concerns addressed on today's visit. -Examined patient. -Discussed nail border ulceration left hallux with patient and daughter. Dustin Alvarez evaluated patient, spoke to patient and daughter and will manage him for the ulcer. -Nonviable  hyperkeratosis debrided left hallux. Digit cleansed with alcohol. Silvadene Cream and light dressing applied. -For pressure injury of heel(s), recommended daily use of heel protectors when patient is in bed.They will try large pillow to float heel before purchasing heel protectors. I demonstrated correct way to float heel. -Patient to continue soft, supportive shoe gear daily. -Mycotic toenails 1-5 bilaterally were debrided in length and girth with sterile nail nippers and dremel without incident. -Preulcerative lesion pared submet head 5 left foot and sub 5th met base left foot utilizing sterile scalpel blade. Total number pared=2. -Xray of left foot was performed and reviewed with patient and/or POA. -Recommended Skechers shoes with stretchable uppers and memory foam insoles. They can be purchased at [employer]. -Printed wound care instructions dispensed to daughter, Dustin Alvarez, for left grea toe. Rx sent to [facility] for Silvadene Cream to apply to left great toe once daily. Call office if there are any changes/concerns before next appointment with Dustin Alvarez. -Patient scheduled to see Dustin Alvarez in 2 weeks for follow up of ulcer left great toe. -Patient/POA to call should there be question/concern in the interim.   Return in about 2 weeks (around 11/28/2021).  Marzetta Board, DPM

## 2021-11-14 NOTE — Patient Instructions (Signed)
DRESSING CHANGES LEFT GREAT TOE:   PHARMACY SHOPPING LIST: Saline or Wound Cleanser for cleaning wound 2 x 2 inch sterile gauze for cleaning wound SILVADENE CREAM  A. IF DISPENSED, WEAR SURGICAL SHOE OR WALKING BOOT AT ALL TIMES.  B. IF PRESCRIBED ORAL ANTIBIOTICS, TAKE ALL MEDICATION AS PRESCRIBED UNTIL ALL ARE GONE.  C. IF DOCTOR HAS DESIGNATED NONWEIGHTBEARING STATUS, PLEASE ADHERE TO INSTRUCTIONS.  1. KEEP LEFT FOOT DRY AT ALL TIMES!!!!  2. CLEANSE ULCER WITH SALINE OR WOUND CLEANSER.  3. DAB DRY WITH GAUZE SPONGE.  4. APPLY A LIGHT AMOUNT OF silvadene cream TO BASE OF ULCER.  5. APPLY OUTER DRESSING AS INSTRUCTED.  6. WEAR SURGICAL SHOE/BOOT DAILY AT ALL TIMES. IF SUPPLIED, WEAR HEEL PROTECTORS AT ALL TIMES WHEN IN BED.  7. DO NOT WALK BAREFOOT!!!  8.  IF YOU EXPERIENCE ANY FEVER, CHILLS, NIGHTSWEATS, NAUSEA OR VOMITING, ELEVATED OR LOW BLOOD SUGARS, REPORT TO EMERGENCY ROOM.  9. IF YOU EXPERIENCE INCREASED REDNESS, PAIN, SWELLING, DISCOLORATION, ODOR, PUS, DRAINAGE OR WARMTH OF YOUR FOOT, REPORT TO EMERGENCY ROOM.

## 2021-11-15 DIAGNOSIS — J432 Centrilobular emphysema: Secondary | ICD-10-CM | POA: Diagnosis not present

## 2021-11-20 ENCOUNTER — Other Ambulatory Visit (HOSPITAL_COMMUNITY): Payer: Self-pay

## 2021-11-20 ENCOUNTER — Other Ambulatory Visit: Payer: Self-pay | Admitting: Nurse Practitioner

## 2021-11-20 DIAGNOSIS — C61 Malignant neoplasm of prostate: Secondary | ICD-10-CM

## 2021-11-20 DIAGNOSIS — G893 Neoplasm related pain (acute) (chronic): Secondary | ICD-10-CM

## 2021-11-20 DIAGNOSIS — Z515 Encounter for palliative care: Secondary | ICD-10-CM

## 2021-11-20 DIAGNOSIS — K5903 Drug induced constipation: Secondary | ICD-10-CM

## 2021-11-20 MED ORDER — OXYCODONE HCL 5 MG PO TABS
5.0000 mg | ORAL_TABLET | ORAL | 0 refills | Status: DC | PRN
  Filled 2021-11-20: qty 180, 15d supply, fill #0

## 2021-11-21 ENCOUNTER — Other Ambulatory Visit (HOSPITAL_COMMUNITY): Payer: Self-pay

## 2021-11-21 IMAGING — CT CT CHEST-ABD-PELV W/ CM
3 of 5 series · 14 of 36 positions shown, 16 images · IV contrast (OMNIPAQUE)
Comparison: None.

CLINICAL DATA: Prostate cancer. Chemotherapy in progress. Radiation
therapy complete.

EXAM:
CT CHEST, ABDOMEN, AND PELVIS WITH CONTRAST
TECHNIQUE: Multidetector CT imaging of the chest, abdomen and pelvis was
performed following the standard protocol during bolus
administration of intravenous contrast.
CONTRAST:  100mL OMNIPAQUE IOHEXOL 300 MG/ML  SOLN

[Series 2: cap with · axial · 0.65mm/px · z∈[-578,-58]mm · 9 of 130 slices shown, 11 images]
[im 13/130  mediastinal]
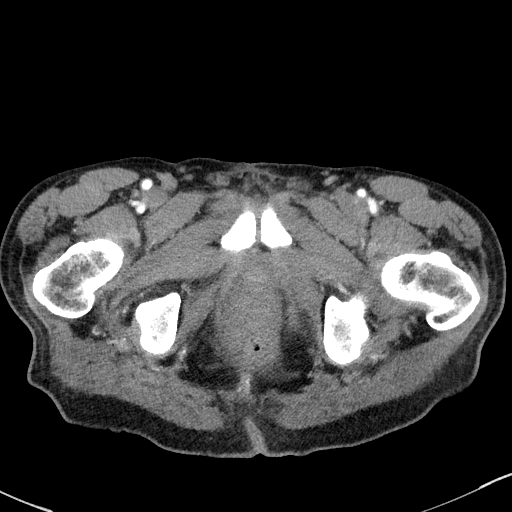
[im 13/130  bone]
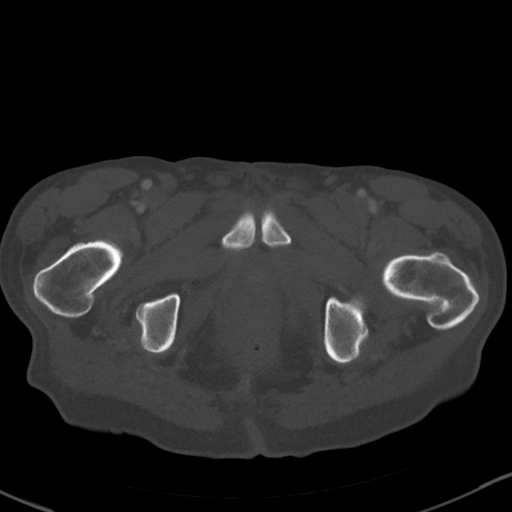
[im 26/130  mediastinal]
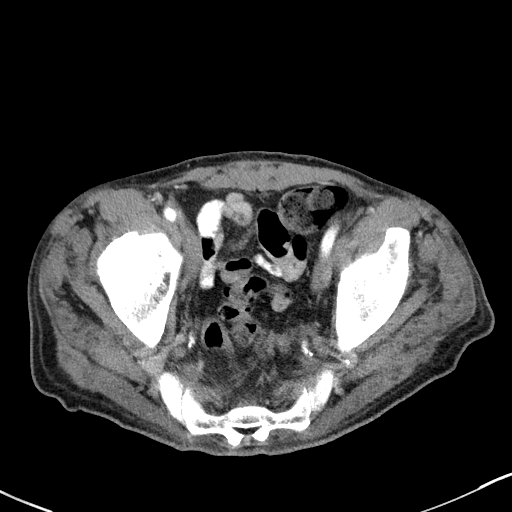
[im 39/130  mediastinal]
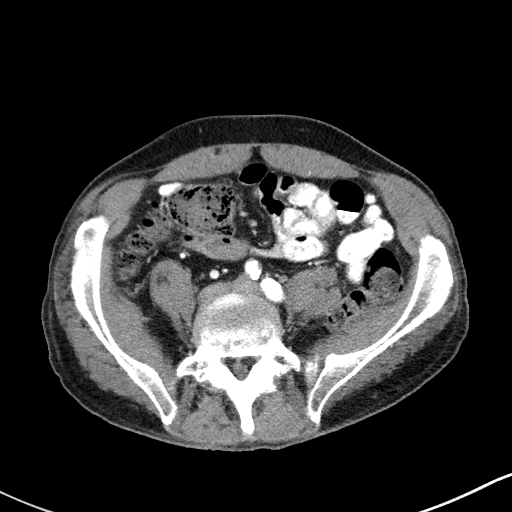
[im 52/130  mediastinal]
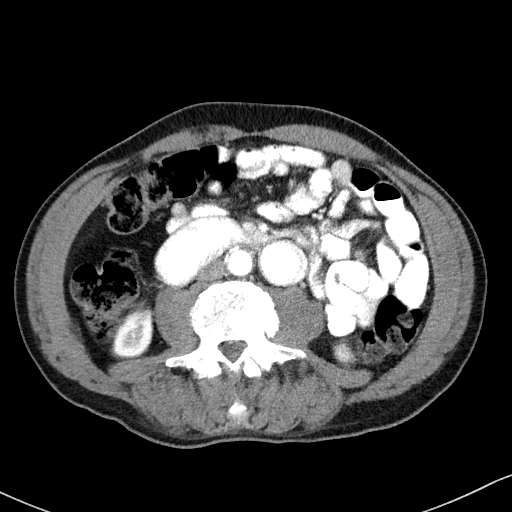
[im 65/130  mediastinal]
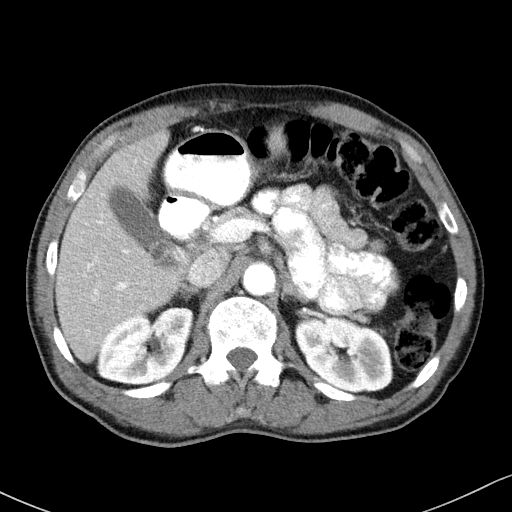
[im 78/130  mediastinal]
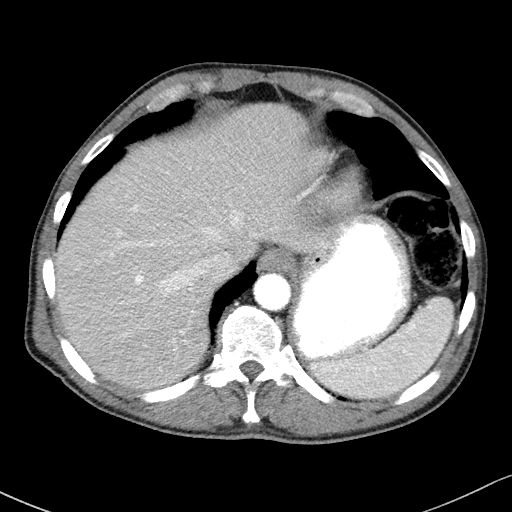
[im 91/130  mediastinal]
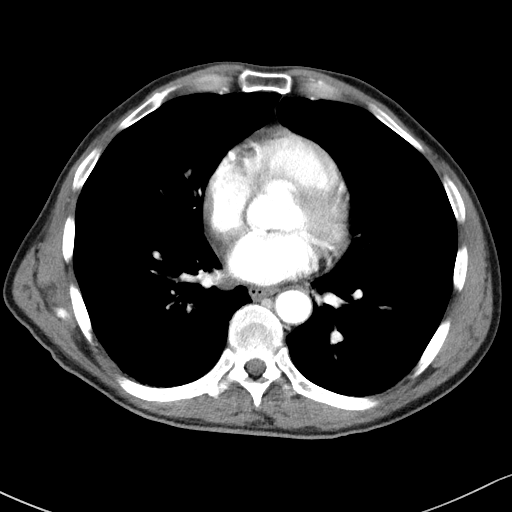
[im 104/130  mediastinal]
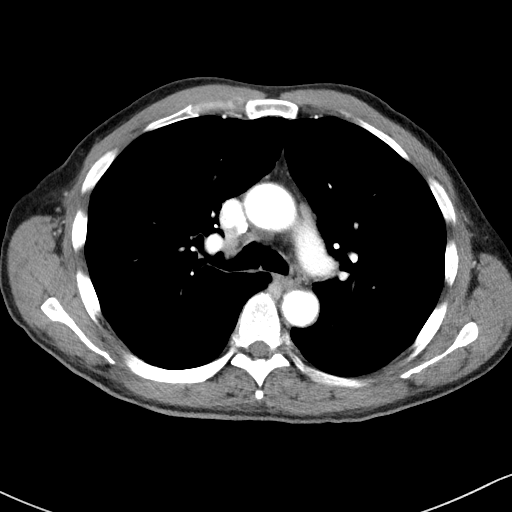
[im 117/130  mediastinal]
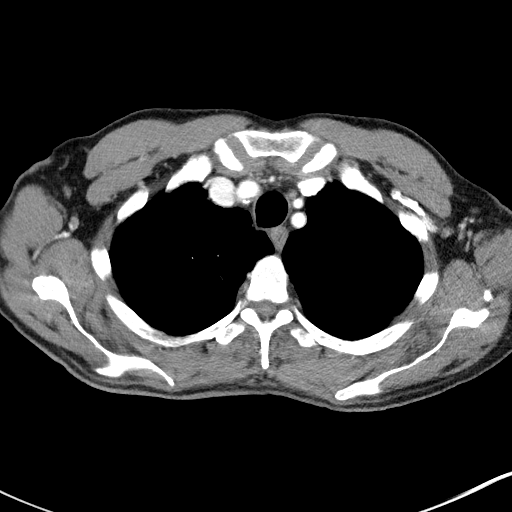
[im 117/130  bone]
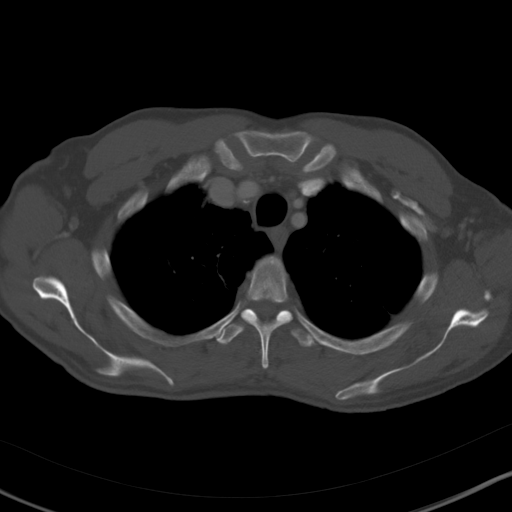

[Series 4: coronals · coronal · 0.82mm/px · 3 of 157 slices shown]
[im 32/157  mediastinal]
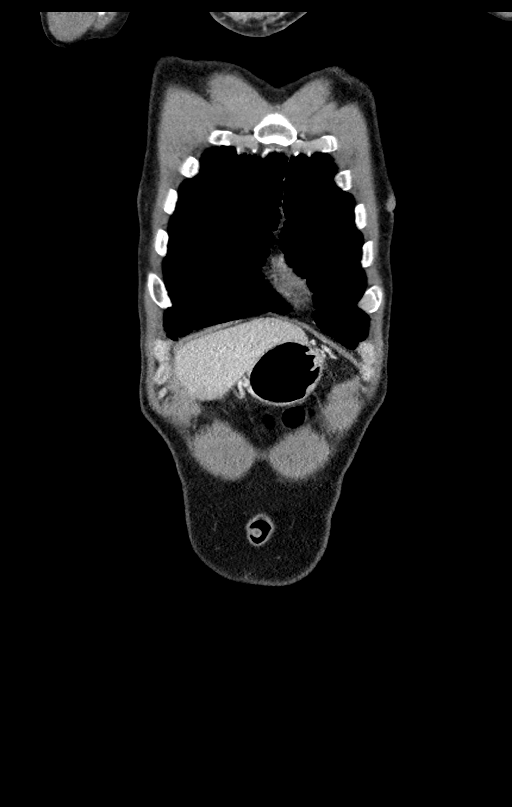
[im 63/157  mediastinal]
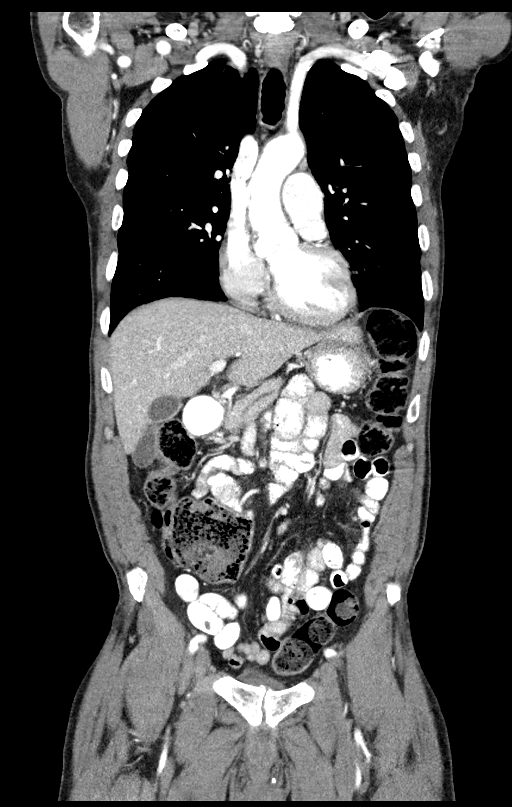
[im 94/157  mediastinal]
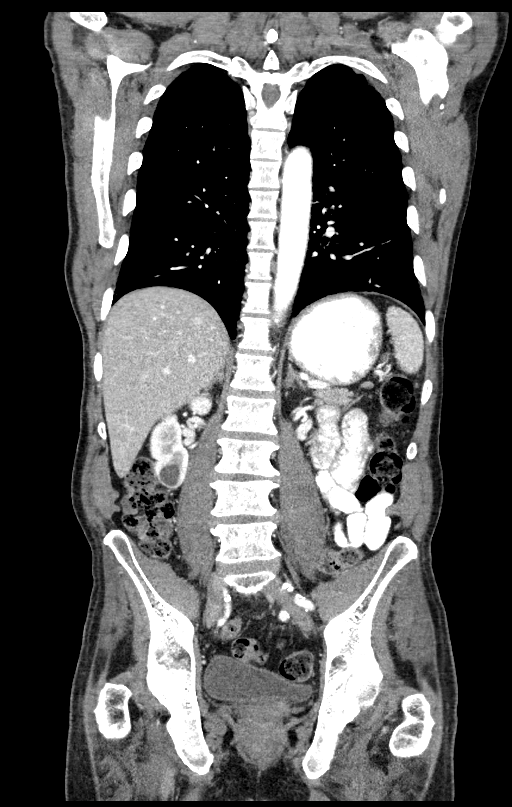

[Series 6: lung · axial · 0.65mm/px · z∈[-281,-233]mm · 2 of 157 slices shown]
[im 13/157  bone]
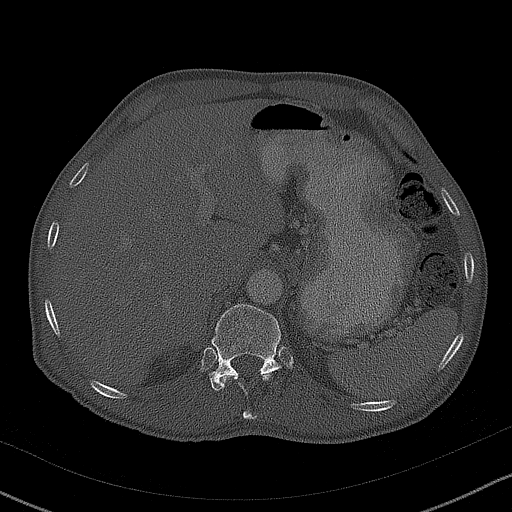
[im 37/157  bone]
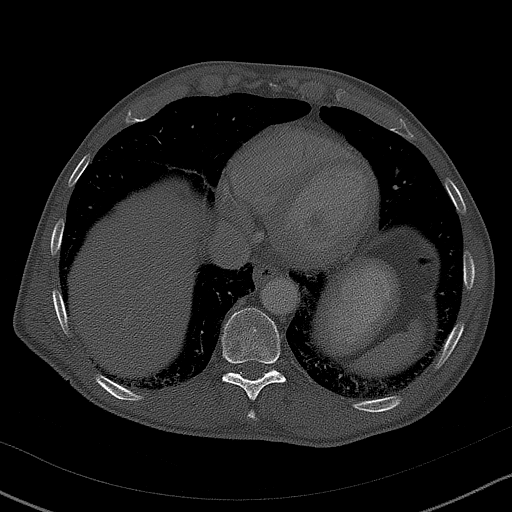

[14 of 36 positions shown; findings below may reference images not displayed]

FINDINGS: CT CHEST FINDINGS

Cardiovascular: The heart is normal in size. No pericardial
effusion. There is mild tortuosity of thoracic aorta but no
atherosclerotic calcifications or dissection. Age advanced
three-vessel coronary artery calcifications are noted. The pulmonary
arteries appear normal.

Mediastinum/Nodes: Scattered mediastinal and hilar lymph nodes. No
mass or overt adenopathy. The esophagus is grossly normal. The
thyroid gland is unremarkable.

Lungs/Pleura: Moderate to advanced emphysematous changes and
pulmonary scarring. No worrisome pulmonary lesions and no pulmonary
nodules to suggest metastatic disease. There is an 11 mm lesion in
the right mainstem bronchus. It measures fluid attenuation and may
be mucous or possibly a cyst. I would recommend a follow-up
noncontrast chest CT in 3 months to reassess this.

Musculoskeletal: No chest wall mass, supraclavicular or axillary
adenopathy. The bony thorax is intact. No lytic or sclerotic bone
lesions are identified.

CT ABDOMEN PELVIS FINDINGS

Hepatobiliary: No hepatic lesions to suggest metastatic disease. The
gallbladder is normal. No common bile duct dilatation. The portal
and hepatic veins are patent.

Pancreas: No mass, inflammation or ductal dilatation.

Spleen: Normal size.  No focal lesions.

Adrenals/Urinary Tract: Adrenal glands and kidneys are unremarkable.
Simple lower pole right renal cyst noted. The bladder is
unremarkable.

Stomach/Bowel: The stomach, duodenum, small bowel and colon are
unremarkable. No acute inflammatory changes, mass lesions or
obstructive findings. The terminal and appendix are normal.

Vascular/Lymphatic: The aorta and branch vessels are patent. Age
advanced atherosclerotic calcifications involving the distal aorta
and iliac arteries no aneurysm or dissection. No mesenteric or
retroperitoneal mass or lymphadenopathy.

Reproductive: The prostate gland and seminal vesicles are grossly
normal. No obvious mass.

Other: No pelvic mass or pelvic adenopathy. No inguinal adenopathy.
No free pelvic fluid collections.

Musculoskeletal: Extensive destructive bony changes involving the
upper sacrum with a pathologic fracture of S1. Findings could be due
to metastatic prostate cancer or possibly radiation necrosis. I do
not see any other obvious bone lesions. Moderate degenerative
changes involving both hips, right greater than left.
IMPRESSION: 1. Extensive destructive bony changes involving the upper sacrum
with a pathologic fracture of S1. Findings could be due to
metastatic prostate cancer or possibly radiation necrosis.
2. No other obvious bone lesions.
3. No findings for metastatic disease involving the chest, abdomen
or pelvis.
4. Moderate to advanced emphysematous changes and pulmonary
scarring.
5. 11 mm lesion in the right mainstem bronchus, possibly mucous or
possibly a cyst. Recommend follow-up noncontrast chest CT in 3
months to reassess.
6. Age advanced atherosclerotic calcifications involving the distal
aorta and iliac arteries.
7. Age advanced three-vessel coronary artery calcifications.
8. Emphysema and aortic atherosclerosis.

Aortic Atherosclerosis (655SF-BDR.R) and Emphysema (655SF-QC5.Q).

## 2021-11-26 ENCOUNTER — Ambulatory Visit (INDEPENDENT_AMBULATORY_CARE_PROVIDER_SITE_OTHER): Payer: Medicare Other | Admitting: Podiatry

## 2021-11-26 DIAGNOSIS — B351 Tinea unguium: Secondary | ICD-10-CM | POA: Diagnosis not present

## 2021-11-26 DIAGNOSIS — M79675 Pain in left toe(s): Secondary | ICD-10-CM

## 2021-11-26 DIAGNOSIS — M79674 Pain in right toe(s): Secondary | ICD-10-CM

## 2021-11-26 NOTE — Progress Notes (Signed)
   Chief Complaint  Patient presents with   wound care    Patient is here for follow-up callous/ ulcer Bilateral.    HPI: 66 y.o. male presenting today for follow-up evaluation after partial nail matricectomy to the medial border of the left great toe performed by Dr. Adah Perl.  Patient states that he is doing well.  He has been applying antibiotic cream and a Band-Aid.  No new complaints at this time  Past Medical History:  Diagnosis Date   Asthma    COPD (chronic obstructive pulmonary disease) (HCC)    GERD (gastroesophageal reflux disease)    Hypertension    Prostate cancer [facility])     Past Surgical History:  Procedure Laterality Date   ABDOMINAL AORTOGRAM W/LOWER EXTREMITY N/A 10/26/2020   Procedure: ABDOMINAL AORTOGRAM W/LOWER EXTREMITY;  Surgeon: Marty Heck, MD;  Location: [place] [facility];  Service: Cardiovascular;  Laterality: N/A;   COLONOSCOPY WITH PROPOFOL N/A 05/22/2021   Procedure: COLONOSCOPY WITH PROPOFOL;  Surgeon: Doran Stabler, MD;  [facility];  Service: Gastroenterology;  Laterality: N/A;   ESOPHAGOGASTRODUODENOSCOPY (EGD) WITH PROPOFOL N/A 05/22/2021   Procedure: ESOPHAGOGASTRODUODENOSCOPY (EGD) WITH PROPOFOL;  Surgeon: Doran Stabler, MD;  [facility];  Service: Gastroenterology;  Laterality: N/A;   HEMOSTASIS CLIP PLACEMENT  05/22/2021   Procedure: HEMOSTASIS CLIP PLACEMENT;  Surgeon: Doran Stabler, MD;  [facility];  Service: Gastroenterology;;   HOT HEMOSTASIS N/A 05/22/2021   Procedure: HOT HEMOSTASIS (ARGON PLASMA COAGULATION/BICAP);  Surgeon: Doran Stabler, MD;  Location: [facility];  Service: Gastroenterology;  Laterality: N/A;   WRIST SURGERY Left 2017    Allergies  Allergen Reactions   Ace Inhibitors Swelling    lisinopril   Lisinopril Swelling     Physical Exam: General: The patient is alert and oriented x3 in no acute distress.  Dermatology: Nail matricectomy to the medial border of  the left great toe appears stable with good routine healing.  Vascular: VAS ABI w/wo TBI bilateral 08/07/2021 Summary:  Right: Resting right ankle-brachial index is within normal range. No  evidence of significant right lower extremity arterial disease. The right  toe-brachial index is normal.   Left: Resting left ankle-brachial index indicates moderate left lower  extremity arterial disease. The left toe-brachial index is abnormal.   Neurological: Light touch and protective threshold diminished  Musculoskeletal Exam: No pedal deformities noted.  No prior amputations  Assessment: 1.  Status post partial nail matricectomy medial border left great toe with routine healing   Plan of Care:  1. Patient evaluated.  2.  Light debridement of the area was performed today using a tissue nipper.  Continue antibiotic cream and a light Band-Aid 3.  Continue wearing good supportive shoes and sneakers 4.  Return to clinic next scheduled appointment with Dr. Adah Perl for routine foot care     Edrick Kins, DPM [facility]  Dr. Edrick Kins, DPM    [address], [place] [address]                Office [phone]  Fax [phone]

## 2021-11-27 ENCOUNTER — Encounter (HOSPITAL_COMMUNITY): Payer: Self-pay

## 2021-11-27 ENCOUNTER — Ambulatory Visit (HOSPITAL_COMMUNITY)
Admission: EM | Admit: 2021-11-27 | Discharge: 2021-11-27 | Disposition: A | Discharge status: Home / Self Care | Payer: Medicare Other | Attending: Family Medicine | Admitting: Family Medicine

## 2021-11-27 DIAGNOSIS — G8929 Other chronic pain: Secondary | ICD-10-CM | POA: Diagnosis not present

## 2021-11-27 DIAGNOSIS — M545 Low back pain, unspecified: Secondary | ICD-10-CM | POA: Diagnosis not present

## 2021-11-27 MED ORDER — KETOROLAC TROMETHAMINE 30 MG/ML IJ SOLN
INTRAMUSCULAR | Status: AC
  Filled 2021-11-27: qty 1

## 2021-11-27 MED ORDER — KETOROLAC TROMETHAMINE 30 MG/ML IJ SOLN
15.0000 mg | Freq: Once | INTRAMUSCULAR | Status: AC
  Administered 2021-11-27: 15 mg via INTRAMUSCULAR

## 2021-11-27 NOTE — ED Provider Notes (Signed)
[place]   [id number] 11/27/21 Arrival Time: 3559  ASSESSMENT & PLAN:  1. Chronic left-sided low back pain without sciatica    Acute on chronic exacerbation.  Able to ambulate here and hemodynamically stable. No indication for imaging of back at this time given no trauma and normal neurological exam. Discussed.  Meds ordered this encounter  Medications   ketorolac (TORADOL) 30 MG/ML injection 15 mg   Encourage ROM/movement as tolerated.  Recommend:  Follow-up Information     Farrel Gordon, DO.   Specialty: Internal Medicine Why: If worsening or failing to improve as anticipated. Contact information: [place] [address] [phone]                 Reviewed expectations re: course of current medical issues. Questions answered. Outlined signs and symptoms indicating need for more acute intervention. Patient verbalized understanding. After Visit Summary given.   SUBJECTIVE: History from: patient and family member.  Dustin Alvarez is a 66 y.o. male who reports LEFT sided LBP; acute on chronic. "Just tried to stand up and felt a pull"; 3 d ago; sore since. Somewhat improved over past 1-2 d but still sore. No extremity sensation changes or weakness. Normal bowel/bladder habits. Ambulatory with cane. Takes oxycodone daily.   OBJECTIVE:  Vitals:   11/27/21 1441  BP: (!) 158/92  Pulse: 87  Resp: 16  Temp: 98.9 F (37.2 C)  TempSrc: Oral  SpO2: 90%    General appearance: alert; no distress HEENT: [place]; AT Neck: supple with FROM; without midline tenderness Back: poorly localized tenderness to palpation over left lumbar paraspinal musculature ; FROM at waist; bruising: none; without midline tenderness Extremities: without edema; symmetrical without gross deformities; normal ROM of bilateral LE Skin: warm and dry Neurologic: normal gait; normal sensation and strength of bilateral LE Psychological: alert and cooperative;  normal mood and affect   Allergies  Allergen Reactions   Ace Inhibitors Swelling    lisinopril   Lisinopril Swelling    Past Medical History:  Diagnosis Date   Asthma    COPD (chronic obstructive pulmonary disease) (HCC)    GERD (gastroesophageal reflux disease)    Hypertension    Prostate cancer ([place])    Social History   Socioeconomic History   Marital status: Widowed    Spouse name: Not on file   Number of children: Not on file   Years of education: Not on file   Highest education level: Not on file  Occupational History   Not on file  Tobacco Use   Smoking status: Some Days    Packs/day: 1.00    Years: 53.00    Total pack years: 53.00    Types: Cigarettes   Smokeless tobacco: Never   Tobacco comments:    0.5 PPD/Wants patches   Vaping Use   Vaping Use: Never used  Substance and Sexual Activity   Alcohol use: Yes    Comment: 1-2 drinks per week.   Drug use: Not Currently   Sexual activity: Not Currently  Other Topics Concern   Not on file  Social History Narrative   Not on file   Social Determinants of Health   Financial Resource Strain: Not on file  Food Insecurity: Not on file  Transportation Needs: Not on file  Physical Activity: Not on file  Stress: Not on file  Social Connections: Not on file  Intimate Partner Violence: Not on file   Family History  Problem Relation Age of Onset  COPD Father    Diabetes Sister    Diabetes Brother    Breast cancer Neg Hx    Prostate cancer Neg Hx    Colon cancer Neg Hx    Pancreatic cancer Neg Hx    Stomach cancer Neg Hx    Esophageal cancer Neg Hx    Past Surgical History:  Procedure Laterality Date   ABDOMINAL AORTOGRAM W/LOWER EXTREMITY N/A 10/26/2020   Procedure: ABDOMINAL AORTOGRAM W/LOWER EXTREMITY;  Surgeon: Marty Heck, MD;  Location: [facility];  Service: Cardiovascular;  Laterality: N/A;   COLONOSCOPY WITH PROPOFOL N/A 05/22/2021   Procedure: COLONOSCOPY WITH PROPOFOL;   Surgeon: Doran Stabler, MD;  Location: [facility];  Service: Gastroenterology;  Laterality: N/A;   ESOPHAGOGASTRODUODENOSCOPY (EGD) WITH PROPOFOL N/A 05/22/2021   Procedure: ESOPHAGOGASTRODUODENOSCOPY (EGD) WITH PROPOFOL;  Surgeon: Doran Stabler, MD;  Location: [facility];  Service: Gastroenterology;  Laterality: N/A;   HEMOSTASIS CLIP PLACEMENT  05/22/2021   Procedure: HEMOSTASIS CLIP PLACEMENT;  Surgeon: Doran Stabler, MD;  Location: [facility];  Service: Gastroenterology;;   HOT HEMOSTASIS N/A 05/22/2021   Procedure: HOT HEMOSTASIS (ARGON PLASMA COAGULATION/BICAP);  Surgeon: Doran Stabler, MD;  Location: [facility];  Service: Gastroenterology;  Laterality: N/A;   WRIST SURGERY Left 2017      Vanessa Kick, MD 11/27/21 1542

## 2021-11-27 NOTE — ED Triage Notes (Signed)
Pt is here for lower back pain towards the right sidex 3days

## 2021-11-27 NOTE — Discharge Instructions (Signed)
Meds ordered this encounter  Medications   ketorolac (TORADOL) 30 MG/ML injection 15 mg

## 2021-11-28 ENCOUNTER — Other Ambulatory Visit (HOSPITAL_COMMUNITY): Payer: Self-pay

## 2021-11-28 ENCOUNTER — Inpatient Hospital Stay: Payer: Medicare Other | Attending: Hematology and Oncology | Admitting: Nurse Practitioner

## 2021-11-28 ENCOUNTER — Encounter: Payer: Self-pay | Admitting: Nurse Practitioner

## 2021-11-28 DIAGNOSIS — M791 Myalgia, unspecified site: Secondary | ICD-10-CM

## 2021-11-28 DIAGNOSIS — K5903 Drug induced constipation: Secondary | ICD-10-CM | POA: Diagnosis not present

## 2021-11-28 DIAGNOSIS — G893 Neoplasm related pain (acute) (chronic): Secondary | ICD-10-CM | POA: Diagnosis not present

## 2021-11-28 DIAGNOSIS — Z515 Encounter for palliative care: Secondary | ICD-10-CM | POA: Diagnosis not present

## 2021-11-28 DIAGNOSIS — C61 Malignant neoplasm of prostate: Secondary | ICD-10-CM | POA: Diagnosis not present

## 2021-11-28 DIAGNOSIS — C7951 Secondary malignant neoplasm of bone: Secondary | ICD-10-CM | POA: Diagnosis not present

## 2021-11-28 MED ORDER — XTAMPZA ER 36 MG PO C12A
36.0000 mg | EXTENDED_RELEASE_CAPSULE | Freq: Three times a day (TID) | ORAL | 0 refills | Status: DC
  Filled 2021-12-04: qty 9, 3d supply, fill #0
  Filled 2021-12-04: qty 81, 27d supply, fill #0

## 2021-11-28 MED ORDER — TIZANIDINE HCL 2 MG PO TABS
2.0000 mg | ORAL_TABLET | Freq: Two times a day (BID) | ORAL | 0 refills | Status: DC | PRN
  Filled 2021-11-28: qty 10, 5d supply, fill #0

## 2021-11-28 NOTE — Progress Notes (Unsigned)
[place]  Telephone:(336) [phone] Fax:(336) [phone]   Name: LEVEON PELZER Date: 11/28/2021 MRN: [id number]  DOB: 08-11-1955  Patient Care Team: Farrel Gordon, DO as PCP - General (Internal Medicine) Cira Rue, RN Nurse Navigator as Registered Nurse (Medical Oncology) Pickenpack-Cousar, Carlena Sax, NP as Nurse Practitioner (Nurse Practitioner)    I connected with [name] on 11/29/21 at 12:00 PM EDT by '@[email or web address]'$ @ and verified that I am speaking with the correct person using two identifiers.   I discussed the limitations, risks, security and privacy concerns of performing an evaluation and management service by telemedicine and the availability of in-person appointments. I also discussed with the patient that there may be a patient responsible charge related to this service. The patient expressed understanding and agreed to proceed.   Other persons participating in the visit and their role in the encounter: [name] (daughter), Maygan, RN   Patient's location: Home   Provider's location: [place]: TRASK VOSLER is a 66 y.o. 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  Palliative ask to see for symptom management and goals of care.    SOCIAL HISTORY:     reports that he has been smoking cigarettes. He has a 53.00 pack-year smoking history. He has never used smokeless tobacco. He reports current alcohol use. He reports that he does not currently use drugs.  ADVANCE DIRECTIVES:  Patient reports his children would be his medical decision makers. Does have MOST on file. Personally reviewed an discussed with no requested changes.    CODE STATUS: DNR  PAST MEDICAL HISTORY: Past Medical History:  Diagnosis Date   Asthma     COPD (chronic obstructive pulmonary disease) (HCC)    GERD (gastroesophageal reflux disease)    Hypertension    Prostate cancer ([place])     PAST SURGICAL HISTORY:  Past Surgical History:  Procedure Laterality Date   ABDOMINAL AORTOGRAM W/LOWER EXTREMITY N/A 10/26/2020   Procedure: ABDOMINAL AORTOGRAM W/LOWER EXTREMITY;  Surgeon: Marty Heck, MD;  Location: [facility];  Service: Cardiovascular;  Laterality: N/A;   COLONOSCOPY WITH PROPOFOL N/A 05/22/2021   Procedure: COLONOSCOPY WITH PROPOFOL;  Surgeon: Doran Stabler, MD;  [facility];  Service: Gastroenterology;  Laterality: N/A;   ESOPHAGOGASTRODUODENOSCOPY (EGD) WITH PROPOFOL N/A 05/22/2021   Procedure: ESOPHAGOGASTRODUODENOSCOPY (EGD) WITH PROPOFOL;  Surgeon: Doran Stabler, MD;  Location: [facility];  Service: Gastroenterology;  Laterality: N/A;   HEMOSTASIS CLIP PLACEMENT  05/22/2021   Procedure: HEMOSTASIS CLIP PLACEMENT;  Surgeon: Doran Stabler, MD;  Location: [facility];  Service: Gastroenterology;;   HOT HEMOSTASIS N/A 05/22/2021   Procedure: HOT HEMOSTASIS (ARGON PLASMA COAGULATION/BICAP);  Surgeon: Doran Stabler, MD;  Location: [facility];  Service: Gastroenterology;  Laterality: N/A;   WRIST SURGERY Left 2017    HEMATOLOGY/ONCOLOGY HISTORY:  Oncology History   No history exists.    ALLERGIES:  is allergic to ace inhibitors and lisinopril.  MEDICATIONS:  Current Outpatient Medications  Medication Sig Dispense Refill   abiraterone acetate (ZYTIGA) 250 MG tablet TAKE 4 TABLETS BY MOUTH DAILY. TAKE ON AN EMPTY STOMACH 1 HOUR BEFORE OR 2 HOURS AFTER A MEAL 120 tablet 2   albuterol (PROVENTIL) (2.5 MG/3ML) 0.083% nebulizer solution Take 3 mLs (2.5 mg total) by nebulization every 6 (six)  hours as needed for wheezing or shortness of breath. 180 mL 5   albuterol (VENTOLIN HFA) 108 (90 Base) MCG/ACT inhaler INHALE TWO puffs into THE lungs EVERY SIX HOURS AS NEEDED wheezing OR For  SHORTNESS OF BREATH 6.7 g 3   amLODipine (NORVASC) 5 MG tablet Take 1 tablet (5 mg total) by mouth daily. 90 tablet 1   calcium-vitamin D (OSCAL WITH D) 500-200 MG-UNIT tablet Take 1 tablet by mouth daily with breakfast. 90 tablet 3   docusate sodium (COLACE) 100 MG capsule Take 200 mg by mouth daily.     finasteride (PROSCAR) 5 MG tablet Take 1 tablet (5 mg total) by mouth daily. 90 tablet 3   Fluticasone-Umeclidin-Vilant (TRELEGY ELLIPTA) 200-62.5-25 MCG/ACT AEPB Inhale 1 puff into the lungs daily. Use once daily 60 each 11   gabapentin (NEURONTIN) 300 MG capsule Take 2 capsules (600 mg total) by mouth 2 (two) times daily. 120 capsule 2   lidocaine (LIDODERM) 5 % Place 1 patch onto the skin daily. Remove & Discard patch within 12 hours or as directed by MD 30 patch 0   nicotine (NICOTROL) 10 MG inhaler Inhale 1 continuous puffing into the lungs as needed for smoking cessation.     oxyCODONE (OXY IR/ROXICODONE) 5 MG immediate release tablet Take 1-2 tablets (5-10 mg total) by mouth every 4 (four) hours as needed for severe pain, breakthrough pain or moderate pain. 180 tablet 0   oxyCODONE ER (XTAMPZA ER) 36 MG C12A Take 1 capsule (36 mg total) by mouth every 8 (eight) hours. 90 capsule 0   pantoprazole (PROTONIX) 40 MG tablet Take 1 tablet (40 mg total) by mouth daily. 90 tablet 3   polyethylene glycol (MIRALAX) 17 g packet Mix 17 grams (1 packet) with 4-8 ounces of liquid and take by mouth 2 (two) times daily. 60 each 2   predniSONE (DELTASONE) 5 MG tablet y1y BY MOUTH EVERY DAY with breakfast .**Do not take prior TO taking Abiraterone** 90 tablet 1   senna-docusate (SENNA S) 8.6-50 MG tablet Take 2 tablets by mouth daily. 60 tablet 2   sildenafil (VIAGRA) 100 MG tablet Take 1 tablet (100 mg total) by mouth daily as needed for erectile dysfunction. 30 tablet 0   silver sulfADIAZINE (SILVADENE) 1 % cream Apply to left great toe once daily. 50 g 1   sorbitol 70 % SOLN Take 15 mLs by mouth daily as  needed. 473 mL 0   tamsulosin (FLOMAX) 0.4 MG CAPS capsule Take 1 capsule (0.4 mg total) by mouth in the morning and at bedtime. 180 capsule 0   traZODone (DESYREL) 100 MG tablet Take 0.5 tablets (50 mg total) by mouth at bedtime. 90 tablet 1   No current facility-administered medications for this visit.    VITAL SIGNS: There were no vitals taken for this visit. There were no vitals filed for this visit.   Estimated body mass index is 19.23 kg/m as calculated from the following:   Height as of 11/13/21: '5\' 10"'$  (1.778 m).   Weight as of 11/13/21: 134 lb (60.8 kg).   PERFORMANCE STATUS (ECOG) : 1 - Symptomatic but completely ambulatory  IMPRESSION: I touched base with Mr. Shein and his daughter by phone. He is complaining of some back discomfort. Urgent care visit on yesterday due to pulled muscle. Received Toradol injection which helped some. Reports some occasional spasms.   1.  Neoplasm related pain Mr. Armstrong states pain is well controlled on current regimen. Tolerating regimen without concern.  We reviewed at length his current pain regimen which consists of Xtampza 36 mg every 8 hours, Oxy IR 5-10 mg every 4 hours as needed for breakthrough pain, and gabapentin 600 mg twice daily. Per patient and daughter he is taking all medications as prescribed. Does not require Oxy IR around the clock.   We will plan to continue to monitor and make adjustments as needed.   2.  Constipation Constipation much improved on regimen. He is taking Senna twice daily. Also has sorbitol available if needed. Is having daily bowel movements per patient and daughter. Encouraged to continue with bowel regimen.    PLAN:    Tizanidine '2mg'$  every 12 hours as needed for muscle spasms Xtampza 36 mg to every 8 hours. Tolerating well. Pain controlled.  5-10 mg Oxy IR every 4 hours as needed for breakthrough pain as prescribed Gabapentin 600 mg twice daily.   Lidocaine patch to lower back Sorbitol as  needed for constipation Senna-S twice daily  Ongoing goals of care discussion.  I will plan to see patient back in 4-6 weeks in collaboration to other oncology appointments.    Patient expressed understanding and was in agreement with this plan. He also understands that He can call the clinic at any time with any questions, concerns, or complaints.    Any controlled substances utilized were prescribed in the context of palliative care. PDMP has been reviewed.   Time Total: 35 min   Visit consisted of counseling and education dealing with the complex and emotionally intense issues of symptom management and palliative care in the setting of serious and potentially life-threatening illness.Greater than 50%  of this time was spent counseling and coordinating care related to the above assessment and plan.  Alda Lea, AGPCNP-BC  Palliative Medicine Team/[place]

## 2021-11-29 ENCOUNTER — Encounter: Payer: Self-pay | Admitting: Hematology and Oncology

## 2021-11-30 ENCOUNTER — Other Ambulatory Visit (HOSPITAL_COMMUNITY): Payer: Self-pay

## 2021-12-03 ENCOUNTER — Other Ambulatory Visit: Payer: Self-pay | Admitting: Internal Medicine

## 2021-12-04 ENCOUNTER — Other Ambulatory Visit (HOSPITAL_COMMUNITY): Payer: Self-pay

## 2021-12-06 ENCOUNTER — Other Ambulatory Visit (HOSPITAL_COMMUNITY): Payer: Self-pay

## 2021-12-09 DIAGNOSIS — J432 Centrilobular emphysema: Secondary | ICD-10-CM | POA: Diagnosis not present

## 2021-12-11 ENCOUNTER — Other Ambulatory Visit (HOSPITAL_COMMUNITY): Payer: Self-pay

## 2021-12-16 DIAGNOSIS — J432 Centrilobular emphysema: Secondary | ICD-10-CM | POA: Diagnosis not present

## 2021-12-17 ENCOUNTER — Other Ambulatory Visit (HOSPITAL_COMMUNITY): Payer: Self-pay

## 2021-12-18 ENCOUNTER — Other Ambulatory Visit: Payer: Self-pay | Admitting: Nurse Practitioner

## 2021-12-18 ENCOUNTER — Other Ambulatory Visit (HOSPITAL_COMMUNITY): Payer: Self-pay

## 2021-12-18 DIAGNOSIS — G893 Neoplasm related pain (acute) (chronic): Secondary | ICD-10-CM

## 2021-12-18 DIAGNOSIS — C61 Malignant neoplasm of prostate: Secondary | ICD-10-CM

## 2021-12-18 DIAGNOSIS — Z515 Encounter for palliative care: Secondary | ICD-10-CM

## 2021-12-18 DIAGNOSIS — K5903 Drug induced constipation: Secondary | ICD-10-CM

## 2021-12-18 MED ORDER — OXYCODONE HCL 5 MG PO TABS
5.0000 mg | ORAL_TABLET | ORAL | 0 refills | Status: DC | PRN
  Filled 2021-12-18: qty 180, 15d supply, fill #0

## 2021-12-19 ENCOUNTER — Other Ambulatory Visit (HOSPITAL_COMMUNITY): Payer: Self-pay

## 2021-12-26 ENCOUNTER — Inpatient Hospital Stay: Payer: Medicare Other | Attending: Hematology and Oncology | Admitting: Nurse Practitioner

## 2021-12-26 ENCOUNTER — Other Ambulatory Visit (HOSPITAL_COMMUNITY): Payer: Self-pay

## 2021-12-26 ENCOUNTER — Encounter: Payer: Self-pay | Admitting: Nurse Practitioner

## 2021-12-26 ENCOUNTER — Other Ambulatory Visit: Payer: Self-pay

## 2021-12-26 DIAGNOSIS — G893 Neoplasm related pain (acute) (chronic): Secondary | ICD-10-CM | POA: Diagnosis not present

## 2021-12-26 DIAGNOSIS — C61 Malignant neoplasm of prostate: Secondary | ICD-10-CM | POA: Diagnosis not present

## 2021-12-26 DIAGNOSIS — M791 Myalgia, unspecified site: Secondary | ICD-10-CM

## 2021-12-26 DIAGNOSIS — Z515 Encounter for palliative care: Secondary | ICD-10-CM

## 2021-12-26 DIAGNOSIS — K5900 Constipation, unspecified: Secondary | ICD-10-CM | POA: Diagnosis not present

## 2021-12-26 DIAGNOSIS — C7951 Secondary malignant neoplasm of bone: Secondary | ICD-10-CM | POA: Diagnosis not present

## 2021-12-26 MED ORDER — LACTULOSE 10 GM/15ML PO SOLN
20.0000 g | Freq: Two times a day (BID) | ORAL | 2 refills | Status: AC | PRN
  Filled 2021-12-26: qty 236, 4d supply, fill #0

## 2021-12-26 MED ORDER — TIZANIDINE HCL 2 MG PO TABS
2.0000 mg | ORAL_TABLET | Freq: Two times a day (BID) | ORAL | 1 refills | Status: AC | PRN
  Filled 2021-12-26: qty 45, 23d supply, fill #0

## 2021-12-26 NOTE — Progress Notes (Signed)
[place]  Telephone:(336) [phone] Fax:(336) [phone]   Name: Dustin Alvarez Date: 12/26/2021 MRN: [id number]  DOB: 1955-10-03  Patient Care Team: Farrel Gordon, DO as PCP - General (Internal Medicine) Cira Rue, RN Nurse Navigator as Registered Nurse (Medical Oncology) Pickenpack-Cousar, Carlena Sax, NP as Nurse Practitioner (Nurse Practitioner)    I connected with Dustin Alvarez on 12/26/21 at 11:30 AM EST by phone and verified that I am speaking with the correct person using two identifiers.   I discussed the limitations, risks, security and privacy concerns of performing an evaluation and management service by telemedicine and the availability of in-person appointments. I also discussed with the patient that there may be a patient responsible charge related to this service. The patient expressed understanding and agreed to proceed.   Other persons participating in the visit and their role in the encounter: [name] (daughter), Maygan, RN   Patient's location: Home   Provider's location: [place]: Dustin Alvarez is a 66 y.o. 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  Palliative ask to see for symptom management and goals of care.    SOCIAL HISTORY:     reports that he has been smoking cigarettes. He has a 53.00 pack-year smoking history. He has never used smokeless tobacco. He reports current alcohol use. He reports that he does not currently use drugs.  ADVANCE DIRECTIVES:  Patient reports his children would be his medical decision makers. Does have MOST on file. Personally reviewed an discussed with no requested changes.    CODE STATUS: DNR  PAST MEDICAL HISTORY: Past Medical History:  Diagnosis Date   Asthma    COPD (chronic  obstructive pulmonary disease) (HCC)    GERD (gastroesophageal reflux disease)    Hypertension    Prostate cancer ([place])     PAST SURGICAL HISTORY:  Past Surgical History:  Procedure Laterality Date   ABDOMINAL AORTOGRAM W/LOWER EXTREMITY N/A 10/26/2020   Procedure: ABDOMINAL AORTOGRAM W/LOWER EXTREMITY;  Surgeon: Marty Heck, MD;  Location: [facility];  Service: Cardiovascular;  Laterality: N/A;   COLONOSCOPY WITH PROPOFOL N/A 05/22/2021   Procedure: COLONOSCOPY WITH PROPOFOL;  Surgeon: Doran Stabler, MD;  [facility]: [facility];  Service: Gastroenterology;  Laterality: N/A;   ESOPHAGOGASTRODUODENOSCOPY (EGD) WITH PROPOFOL N/A 05/22/2021   Procedure: ESOPHAGOGASTRODUODENOSCOPY (EGD) WITH PROPOFOL;  Surgeon: Doran Stabler, MD;  Location: [facility];  Service: Gastroenterology;  Laterality: N/A;   HEMOSTASIS CLIP PLACEMENT  05/22/2021   Procedure: HEMOSTASIS CLIP PLACEMENT;  Surgeon: Doran Stabler, MD;  Location: [facility];  Service: Gastroenterology;;   HOT HEMOSTASIS N/A 05/22/2021   Procedure: HOT HEMOSTASIS (ARGON PLASMA COAGULATION/BICAP);  Surgeon: Doran Stabler, MD;  Location: [facility];  Service: Gastroenterology;  Laterality: N/A;   WRIST SURGERY Left 2017    HEMATOLOGY/ONCOLOGY HISTORY:  Oncology History   No history exists.    ALLERGIES:  is allergic to ace inhibitors and lisinopril.  MEDICATIONS:  Current Outpatient Medications  Medication Sig Dispense Refill   lactulose (CHRONULAC) 10 GM/15ML solution Take 30 mLs (20 g total) by mouth 2 (two) times daily as needed for mild constipation. 236 mL 2   abiraterone acetate (ZYTIGA) 250 MG tablet TAKE 4 TABLETS BY MOUTH DAILY. TAKE ON AN EMPTY STOMACH 1 HOUR BEFORE OR 2  HOURS AFTER A MEAL 120 tablet 2   albuterol (PROVENTIL) (2.5 MG/3ML) 0.083% nebulizer solution Take 3 mLs (2.5 mg total) by nebulization every 6 (six) hours as needed for wheezing or shortness of breath. 180 mL 5    albuterol (VENTOLIN HFA) 108 (90 Base) MCG/ACT inhaler INHALE TWO puffs into THE lungs EVERY SIX HOURS AS NEEDED wheezing OR For SHORTNESS OF BREATH 6.7 g 3   amLODipine (NORVASC) 5 MG tablet Take 1 tablet (5 mg total) by mouth daily. 90 tablet 1   calcium-vitamin D (OSCAL WITH D) 500-200 MG-UNIT tablet Take 1 tablet by mouth daily with breakfast. 90 tablet 3   docusate sodium (COLACE) 100 MG capsule Take 200 mg by mouth daily.     finasteride (PROSCAR) 5 MG tablet Take 1 tablet (5 mg total) by mouth daily. 90 tablet 3   Fluticasone-Umeclidin-Vilant (TRELEGY ELLIPTA) 200-62.5-25 MCG/ACT AEPB Inhale 1 puff into the lungs daily. Use once daily 60 each 11   gabapentin (NEURONTIN) 300 MG capsule Take 2 capsules (600 mg total) by mouth 2 (two) times daily. 120 capsule 2   lidocaine (LIDODERM) 5 % place ONE PATCH onto THE SKIN daily. REMOVE AND discard WITHIN 12 HOURS OR AS DIRECTED 30 patch 0   nicotine (NICOTROL) 10 MG inhaler Inhale 1 continuous puffing into the lungs as needed for smoking cessation.     oxyCODONE (OXY IR/ROXICODONE) 5 MG immediate release tablet Take 1-2 tablets (5-10 mg total) by mouth every 4 (four) hours as needed for severe pain, breakthrough pain or moderate pain. 180 tablet 0   oxyCODONE ER (XTAMPZA ER) 36 MG C12A Take 1 capsule (36 mg total) by mouth every 8 (eight) hours. 90 capsule 0   pantoprazole (PROTONIX) 40 MG tablet Take 1 tablet (40 mg total) by mouth daily. 90 tablet 3   polyethylene glycol (MIRALAX) 17 g packet Mix 17 grams (1 packet) with 4-8 ounces of liquid and take by mouth 2 (two) times daily. 60 each 2   predniSONE (DELTASONE) 5 MG tablet y1y BY MOUTH EVERY DAY with breakfast .**Do not take prior TO taking Abiraterone** 90 tablet 1   senna-docusate (SENNA S) 8.6-50 MG tablet Take 2 tablets by mouth daily. 60 tablet 2   sildenafil (VIAGRA) 100 MG tablet Take 1 tablet (100 mg total) by mouth daily as needed for erectile dysfunction. 30 tablet 0   silver  sulfADIAZINE (SILVADENE) 1 % cream Apply to left great toe once daily. 50 g 1   sorbitol 70 % SOLN Take 15 mLs by mouth daily as needed. 473 mL 0   tamsulosin (FLOMAX) 0.4 MG CAPS capsule Take 1 capsule (0.4 mg total) by mouth in the morning and at bedtime. 180 capsule 0   tiZANidine (ZANAFLEX) 2 MG tablet Take 1 tablet (2 mg total) by mouth 2 (two) times daily as needed for muscle spasms. 45 tablet 1   traZODone (DESYREL) 100 MG tablet Take 0.5 tablets (50 mg total) by mouth at bedtime. 90 tablet 1   No current facility-administered medications for this visit.    VITAL SIGNS: There were no vitals taken for this visit. There were no vitals filed for this visit.   Estimated body mass index is 19.23 kg/m as calculated from the following:   Height as of 11/13/21: '5\' 10"'$  (1.778 m).   Weight as of 11/13/21: 134 lb (60.8 kg).   PERFORMANCE STATUS (ECOG) : 1 - Symptomatic but completely ambulatory  IMPRESSION: I touched base with Mr. Sessler and his  daughter by phone. No acute distress noted. Remaining  as active as possible. Continues to have some back discomfort.   1.  Neoplasm related pain Mr. Osmun states pain is well controlled on current regimen. Tolerating regimen without concern.   We reviewed at length his current pain regimen which consists of Xtampza 36 mg every 8 hours, Oxy IR 5-10 mg every 4 hours as needed for breakthrough pain, and gabapentin 600 mg twice daily. Per patient and daughter he is taking all medications as prescribed. Does not require Oxy IR around the clock. He is using lidocaine patch to back for topical support.   We will plan to continue to monitor and make adjustments as needed.   2.  Constipation Constipation much improved on regimen. He is taking Senna twice daily. We discussed use of lactulose as he does have some constipation at times despite senna.    PLAN:    Tizanidine '2mg'$  every 12 hours as needed for muscle spasms Xtampza 36 mg to every 8 hours.  Tolerating well. Pain controlled.  5-10 mg Oxy IR every 4 hours as needed for breakthrough pain as prescribed Gabapentin 600 mg twice daily.   Lidocaine patch to lower back Lactulose as needed for constipation Senna-S twice daily  Ongoing goals of care discussion.  I will plan to see patient back in 4-6 weeks in collaboration to other oncology appointments.    Patient expressed understanding and was in agreement with this plan. He also understands that He can call the clinic at any time with any questions, concerns, or complaints.       Any controlled substances utilized were prescribed in the context of palliative care. PDMP has been reviewed.   Time Total: 20 min  Visit consisted of counseling and education dealing with the complex and emotionally intense issues of symptom management and palliative care in the setting of serious and potentially life-threatening illness.Greater than 50%  of this time was spent counseling and coordinating care related to the above assessment and plan.  Alda Lea, AGPCNP-BC  Palliative Medicine Team/[place]

## 2021-12-31 ENCOUNTER — Other Ambulatory Visit: Payer: Self-pay | Admitting: Podiatry

## 2021-12-31 ENCOUNTER — Other Ambulatory Visit: Payer: Self-pay | Admitting: Internal Medicine

## 2021-12-31 DIAGNOSIS — L97521 Non-pressure chronic ulcer of other part of left foot limited to breakdown of skin: Secondary | ICD-10-CM

## 2022-01-01 ENCOUNTER — Other Ambulatory Visit: Payer: Self-pay | Admitting: Internal Medicine

## 2022-01-04 ENCOUNTER — Other Ambulatory Visit (HOSPITAL_COMMUNITY): Payer: Self-pay

## 2022-01-04 ENCOUNTER — Other Ambulatory Visit: Payer: Self-pay | Admitting: Hematology and Oncology

## 2022-01-04 ENCOUNTER — Other Ambulatory Visit: Payer: Self-pay | Admitting: Nurse Practitioner

## 2022-01-04 DIAGNOSIS — Z515 Encounter for palliative care: Secondary | ICD-10-CM

## 2022-01-04 DIAGNOSIS — C7951 Secondary malignant neoplasm of bone: Secondary | ICD-10-CM

## 2022-01-04 DIAGNOSIS — G893 Neoplasm related pain (acute) (chronic): Secondary | ICD-10-CM

## 2022-01-04 DIAGNOSIS — K5903 Drug induced constipation: Secondary | ICD-10-CM

## 2022-01-05 MED ORDER — ABIRATERONE ACETATE 250 MG PO TABS
ORAL_TABLET | ORAL | 2 refills | Status: AC
  Filled 2022-01-07: qty 120, 30d supply, fill #0
  Filled 2022-02-05: qty 120, 30d supply, fill #1
  Filled 2022-03-05: qty 120, 30d supply, fill #2

## 2022-01-07 ENCOUNTER — Other Ambulatory Visit (HOSPITAL_COMMUNITY): Payer: Self-pay

## 2022-01-07 MED ORDER — XTAMPZA ER 36 MG PO C12A
36.0000 mg | EXTENDED_RELEASE_CAPSULE | Freq: Three times a day (TID) | ORAL | 0 refills | Status: DC
  Filled 2022-01-07: qty 90, 30d supply, fill #0

## 2022-01-09 DIAGNOSIS — J432 Centrilobular emphysema: Secondary | ICD-10-CM | POA: Diagnosis not present

## 2022-01-11 ENCOUNTER — Ambulatory Visit (HOSPITAL_COMMUNITY)
Admission: EM | Admit: 2022-01-11 | Discharge: 2022-01-11 | Disposition: A | Discharge status: Home / Self Care | Payer: Medicare Other

## 2022-01-11 ENCOUNTER — Encounter (HOSPITAL_COMMUNITY): Payer: Self-pay

## 2022-01-11 DIAGNOSIS — N4889 Other specified disorders of penis: Secondary | ICD-10-CM | POA: Diagnosis not present

## 2022-01-11 DIAGNOSIS — R319 Hematuria, unspecified: Secondary | ICD-10-CM

## 2022-01-11 LAB — POCT URINALYSIS DIPSTICK, ED / UC
Bilirubin Urine: NEGATIVE
Glucose, UA: NEGATIVE mg/dL
Ketones, ur: NEGATIVE mg/dL
Leukocytes,Ua: NEGATIVE
Nitrite: NEGATIVE
Protein, ur: NEGATIVE mg/dL
Specific Gravity, Urine: 1.01 (ref 1.005–1.030)
Urobilinogen, UA: 1 mg/dL (ref 0.0–1.0)
pH: 5.5 (ref 5.0–8.0)

## 2022-01-11 NOTE — ED Triage Notes (Signed)
Pt is here for dysuria x 3 days. Pt reports genital are swollen. Pt is currently taken chemo medication for Prostate cancer.

## 2022-01-11 NOTE — ED Provider Notes (Signed)
[place]    CSN: [id number] Arrival date & time: 01/11/22  1343      History   Chief Complaint Chief Complaint  Patient presents with   Dysuria    HPI Dustin Alvarez is a 66 y.o. male.   Patient presents today companied by his daughter who provides majority of history.  Reports a 3-day history of upper thigh and penile swelling with associated hematuria.  He reports that the hematuria has been improving but is not yet resolved.  He does have a history of prostate cancer that has metastasized to his bone.  He underwent palliative radiation therapy in 2021.  He is receiving hormonal treatment.  He denies any dysuria, urinary frequency, urinary urgency, rash or irritation of his penis.  He does report that he has a very sedentary lifestyle and spends most of his time in a recliner.  Denies history of hypoalbuminemia.  Denies any chronic liver or kidney disease.  He does report some discomfort associated with the swelling but denies any significant pain.  He has not seen his urologist or oncologist about current symptoms.  Denies episodes of similar symptoms in the past.  He denies any shortness of breath or cough outside of his baseline; has a history of COPD and has been compliant with maintenance medications.    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   PAD (peripheral artery disease) ([place]) 10/24/2020   Lumbar degenerative disc disease with radiculopathy 08/16/2020   COPD with acute exacerbation ([place])    Intention tremor 07/25/2020   ED (erectile dysfunction)  07/09/2020   Tobacco use 05/22/2020   Cancer associated pain 11/02/2019   Healthcare maintenance 11/02/2019   Insomnia 09/30/2019   Hypertension 09/29/2019   Gastroesophageal reflux disease 09/29/2019   Prostate cancer metastatic to bone ([place]) 09/22/2019    Past Surgical History:  Procedure Laterality Date   ABDOMINAL AORTOGRAM W/LOWER EXTREMITY N/A 10/26/2020   Procedure: ABDOMINAL AORTOGRAM W/LOWER EXTREMITY;  Surgeon: Marty Heck, MD;  Location: [place] [facility];  Service: Cardiovascular;  Laterality: N/A;   COLONOSCOPY WITH PROPOFOL N/A 05/22/2021   Procedure: COLONOSCOPY WITH PROPOFOL;  Surgeon: Doran Stabler, MD;  Location: [facility];  Service: Gastroenterology;  Laterality: N/A;   ESOPHAGOGASTRODUODENOSCOPY (EGD) WITH PROPOFOL N/A 05/22/2021   Procedure: ESOPHAGOGASTRODUODENOSCOPY (EGD) WITH PROPOFOL;  Surgeon: Doran Stabler, MD;  Location: [facility];  Service: Gastroenterology;  Laterality: N/A;   HEMOSTASIS CLIP PLACEMENT  05/22/2021   Procedure: HEMOSTASIS CLIP PLACEMENT;  Surgeon: Doran Stabler, MD;  Location: [facility];  Service: Gastroenterology;;   HOT HEMOSTASIS N/A 05/22/2021   Procedure: HOT HEMOSTASIS (ARGON PLASMA COAGULATION/BICAP);  Surgeon: Doran Stabler, MD;  Location: [facility];  Service: Gastroenterology;  Laterality: N/A;   WRIST SURGERY Left 2017       Home Medications    Prior to Admission medications   Medication Sig Start Date End Date Taking? Authorizing Provider  abiraterone acetate (ZYTIGA) 250 MG tablet TAKE 4 TABLETS BY MOUTH DAILY. TAKE ON AN EMPTY STOMACH 1 HOUR  BEFORE OR 2 HOURS AFTER A MEAL 01/05/22 01/05/23  [name], MD  albuterol (PROVENTIL) (2.5 MG/3ML) 0.083% nebulizer solution Take 3 mLs (2.5 mg total) by nebulization every 6 (six) hours as needed for wheezing or shortness of breath. 10/17/21   [name], MD  albuterol (VENTOLIN HFA) 108 (90 Base) MCG/ACT inhaler INHALE TWO puffs into  THE lungs EVERY SIX HOURS AS NEEDED wheezing OR For SHORTNESS OF BREATH 08/22/21   Masters, Joellen Jersey, DO  amLODipine (NORVASC) 5 MG tablet Take 1 tablet (5 mg total) by mouth daily. 12/31/21   Farrel Gordon, DO  calcium-vitamin D (OSCAL WITH D) 500-200 MG-UNIT tablet Take 1 tablet by mouth daily with breakfast. 01/07/20   Harvie Heck, MD  docusate sodium (COLACE) 100 MG capsule Take 200 mg by mouth daily.    [provider]  finasteride (PROSCAR) 5 MG tablet Take 1 tablet (5 mg total) by mouth daily. 09/11/21 09/11/22  Lacinda Axon, MD  Fluticasone-Umeclidin-Vilant (TRELEGY ELLIPTA) 200-62.5-25 MCG/ACT AEPB Inhale 1 puff into the lungs daily. Use once daily 06/12/21   Margaretha Seeds, MD  gabapentin (NEURONTIN) 300 MG capsule Take 2 capsules (600 mg total) by mouth 2 (two) times daily. 10/04/21   Farrel Gordon, DO  lactulose (CHRONULAC) 10 GM/15ML solution Take 30 mLs (20 g total) by mouth 2 (two) times daily as needed for mild constipation. 12/26/21   Pickenpack-Cousar, Carlena Sax, NP  lidocaine (LIDODERM) 5 % place ONE PATCH onto THE SKIN daily. REMOVE AND discard WITHIN 12 HOURS OR AS DIRECTED 01/02/22   Farrel Gordon, DO  nicotine (NICOTROL) 10 MG inhaler Inhale 1 continuous puffing into the lungs as needed for smoking cessation.    [provider]  oxyCODONE (OXY IR/ROXICODONE) 5 MG immediate release tablet Take 1-2 tablets (5-10 mg total) by mouth every 4 (four) hours as needed for severe pain, breakthrough pain or moderate pain. 12/18/21   Pickenpack-Cousar, Carlena Sax, NP  oxyCODONE ER (XTAMPZA ER) 36 MG C12A Take 1 capsule (36 mg total) by mouth every 8 (eight) hours. 01/07/22   Pickenpack-Cousar, Carlena Sax, NP  pantoprazole (PROTONIX) 40 MG tablet Take 1 tablet (40 mg total) by mouth daily. 04/24/21   [name], MD  polyethylene glycol (MIRALAX) 17 g packet Mix 17 grams (1 packet) with 4-8 ounces of liquid and take by mouth 2 (two) times daily. 05/17/21   Pickenpack-Cousar, Carlena Sax,  NP  predniSONE (DELTASONE) 5 MG tablet y1y BY MOUTH EVERY DAY with breakfast .**Do not take prior TO taking Abiraterone** 08/20/21   [name], MD  senna-docusate (SENNA S) 8.6-50 MG tablet Take 2 tablets by mouth daily. 08/30/21   Pickenpack-Cousar, Carlena Sax, NP  sildenafil (VIAGRA) 100 MG tablet Take 1 tablet (100 mg total) by mouth daily as needed for erectile dysfunction. 10/04/21   Farrel Gordon, DO  silver sulfADIAZINE (SILVADENE) 1 % cream Apply to left great toe once daily. 01/01/22   Marzetta Board, DPM  sorbitol 70 % SOLN Take 15 mLs by mouth daily as needed. 06/25/21   Pickenpack-Cousar, Carlena Sax, NP  tamsulosin (FLOMAX) 0.4 MG CAPS capsule Take 1 capsule (0.4 mg total) by mouth in the morning and at bedtime. 10/04/21   Farrel Gordon, DO  tiZANidine (ZANAFLEX) 2 MG tablet Take 1 tablet (2 mg total) by mouth 2 (two) times daily as needed for muscle spasms. 12/26/21   Pickenpack-Cousar, Carlena Sax, NP  traZODone (DESYREL) 100 MG tablet Take 0.5 tablets (50 mg  total) by mouth at bedtime. 10/04/21   Farrel Gordon, DO    Family History Family History  Problem Relation Age of Onset   COPD Father    Diabetes Sister    Diabetes Brother    Breast cancer Neg Hx    Prostate cancer Neg Hx    Colon cancer Neg Hx    Pancreatic cancer Neg Hx    Stomach cancer Neg Hx    Esophageal cancer Neg Hx     Social History Social History   Tobacco Use   Smoking status: Some Days    Packs/day: 1.00    Years: 53.00    Total pack years: 53.00    Types: Cigarettes   Smokeless tobacco: Never   Tobacco comments:    0.5 PPD/Wants patches   Vaping Use   Vaping Use: Never used  Substance Use Topics   Alcohol use: Yes    Comment: 1-2 drinks per week.   Drug use: Not Currently     Allergies   Ace inhibitors and Lisinopril   Review of Systems Review of Systems  Constitutional:  Positive for activity change. Negative for appetite change, fatigue and fever.  Respiratory:  Negative for cough  and shortness of breath.   Cardiovascular:  Negative for chest pain.  Gastrointestinal:  Negative for abdominal pain, diarrhea, nausea and vomiting.  Genitourinary:  Positive for hematuria, penile swelling and scrotal swelling. Negative for frequency, penile discharge, penile pain, testicular pain and urgency.     Physical Exam Triage Vital Signs ED Triage Vitals  Enc Vitals Group     BP 01/11/22 1427 (!) 140/80     Pulse Rate 01/11/22 1427 83     Resp 01/11/22 1427 18     Temp 01/11/22 1427 98.3 F (36.8 C)     Temp Source 01/11/22 1427 Oral     SpO2 01/11/22 1427 98 %     Weight --      Height --      Head Circumference --      Peak Flow --      Pain Score 01/11/22 1432 4     Pain Loc --      Pain Edu? --      Excl. in [place]? --    No data found.  Updated Vital Signs BP (!) 140/80 (BP Location: Left Arm)   Pulse 83   Temp 98.3 F (36.8 C) (Oral)   Resp 18   SpO2 98%   Visual Acuity Right Eye Distance:   Left Eye Distance:   Bilateral Distance:    Right Eye Near:   Left Eye Near:    Bilateral Near:     Physical Exam Vitals reviewed. Exam conducted with a chaperone present.  Constitutional:      General: He is awake.     Appearance: Normal appearance. He is well-developed. He is not ill-appearing.     Comments: Very pleasant male appears stated age in no acute distress sitting comfortably in wheelchair  HENT:     Head: Normocephalic and atraumatic.     Mouth/Throat:     Pharynx: Uvula midline. No oropharyngeal exudate or posterior oropharyngeal erythema.  Cardiovascular:     Rate and Rhythm: Normal rate and regular rhythm.     Heart sounds: Normal heart sounds, S1 normal and S2 normal. No murmur heard. Pulmonary:     Effort: Pulmonary effort is normal.     Breath sounds: No stridor. Examination of the right-lower field reveals rales. Examination  of the left-lower field reveals rales. Rales present. No wheezing or rhonchi.  Abdominal:     General: Bowel  sounds are normal.     Palpations: Abdomen is soft.     Tenderness: There is no abdominal tenderness. There is no right CVA tenderness, left CVA tenderness, guarding or rebound.  Genitourinary:    Penis: Swelling present.      Testes: Normal.     Comments: Kendrick Fries, CMA present as chaperone during exam.  Swelling noted as of penis as well as upper thighs.  No associated erythema, penile discharge, rash. Musculoskeletal:     Right lower leg: No edema.     Left lower leg: No edema.  Neurological:     Mental Status: He is alert.  Psychiatric:        Behavior: Behavior is cooperative.      UC Treatments / Results  Labs (all labs ordered are listed, but only abnormal results are displayed) Labs Reviewed  POCT URINALYSIS DIPSTICK, ED / UC - Abnormal; Notable for the following components:      Result Value   Hgb urine dipstick LARGE (*)    All other components within normal limits  COMPREHENSIVE METABOLIC PANEL    EKG   Radiology No results found.  Procedures Procedures (including critical care time)  Medications Ordered in UC Medications - No data to display  Initial Impression / Assessment and Plan / UC Course  I have reviewed the triage vital signs and the nursing notes.  Pertinent labs & imaging results that were available during my care of the patient were reviewed by me and considered in my medical decision making (see chart for details).     Patient is well-appearing, afebrile, nontoxic, nontachycardic.  Unclear etiology of symptoms.  Patient was seen in conjunction with Dr. Aretta Nip who helped formulate treatment plan.  Concern for dependent edema.  Recommended supportive undergarments and increased ambulation.  Will obtain a CMP to monitor kidney function and albumin.  No significant microalbuminuria based on UA today.  Low suspicion for acute heart failure given oxygen saturation of 98% and no significant pedal edema.  It is possible this is related to lymphedema  given previous radiation in groin.  Discussed that we do not see abilities of evaluating this in urgent care and recommend close follow-up with urology and oncology.  Daughter will contact them to schedule an appointment as soon as possible.  Discussed that if he has any changing or worsening symptoms he needs to go to the emergency room for further evaluation and management.  Strict return precautions given to which he expressed understanding.  All questions answered to patient and daughter satisfaction.  Final Clinical Impressions(s) / UC Diagnoses   Final diagnoses:  Edema of penis  Hematuria, unspecified type     Discharge Instructions      I will contact you if lab work is abnormal by the end of tomorrow.  As we discussed, I recommend using tight supportive underwear.  Try to get up and walk around multiple times per day to encourage blood flow.  Avoid salt in your diet.  I would like you to follow-up with urologist soon as possible.  Please call them to schedule an appointment.  If anything changes you need to be seen immediately.     ED Prescriptions   None    PDMP not reviewed this encounter.   Terrilee Croak, PA-C 01/11/22 1539

## 2022-01-11 NOTE — Discharge Instructions (Signed)
I will contact you if lab work is abnormal by the end of tomorrow.  As we discussed, I recommend using tight supportive underwear.  Try to get up and walk around multiple times per day to encourage blood flow.  Avoid salt in your diet.  I would like you to follow-up with urologist soon as possible.  Please call them to schedule an appointment.  If anything changes you need to be seen immediately.

## 2022-01-11 NOTE — ED Notes (Signed)
Pt left without CMP draw, daughter states he is having it done next week at his doctors office so they just want to wait.

## 2022-01-14 ENCOUNTER — Other Ambulatory Visit (HOSPITAL_COMMUNITY): Payer: Self-pay

## 2022-01-15 DIAGNOSIS — J432 Centrilobular emphysema: Secondary | ICD-10-CM | POA: Diagnosis not present

## 2022-01-16 ENCOUNTER — Inpatient Hospital Stay (HOSPITAL_BASED_OUTPATIENT_CLINIC_OR_DEPARTMENT_OTHER): Payer: Medicare Other | Admitting: Hematology and Oncology

## 2022-01-16 ENCOUNTER — Inpatient Hospital Stay: Payer: Medicare Other | Attending: Hematology and Oncology

## 2022-01-16 ENCOUNTER — Inpatient Hospital Stay: Payer: Medicare Other

## 2022-01-16 ENCOUNTER — Other Ambulatory Visit (HOSPITAL_COMMUNITY): Payer: Self-pay

## 2022-01-16 ENCOUNTER — Other Ambulatory Visit: Payer: Self-pay | Admitting: *Deleted

## 2022-01-16 ENCOUNTER — Encounter: Payer: Self-pay | Admitting: Nurse Practitioner

## 2022-01-16 ENCOUNTER — Other Ambulatory Visit: Payer: Self-pay | Admitting: Hematology and Oncology

## 2022-01-16 ENCOUNTER — Inpatient Hospital Stay (HOSPITAL_BASED_OUTPATIENT_CLINIC_OR_DEPARTMENT_OTHER): Payer: Medicare Other | Admitting: Nurse Practitioner

## 2022-01-16 VITALS — BP 118/68 | HR 95 | Temp 98.2°F | Resp 17 | Wt 151.9 lb

## 2022-01-16 DIAGNOSIS — C61 Malignant neoplasm of prostate: Secondary | ICD-10-CM

## 2022-01-16 DIAGNOSIS — Z515 Encounter for palliative care: Secondary | ICD-10-CM

## 2022-01-16 DIAGNOSIS — C7951 Secondary malignant neoplasm of bone: Secondary | ICD-10-CM

## 2022-01-16 DIAGNOSIS — Z79891 Long term (current) use of opiate analgesic: Secondary | ICD-10-CM | POA: Insufficient documentation

## 2022-01-16 DIAGNOSIS — K5903 Drug induced constipation: Secondary | ICD-10-CM | POA: Diagnosis not present

## 2022-01-16 DIAGNOSIS — R530 Neoplastic (malignant) related fatigue: Secondary | ICD-10-CM | POA: Diagnosis not present

## 2022-01-16 DIAGNOSIS — G893 Neoplasm related pain (acute) (chronic): Secondary | ICD-10-CM | POA: Diagnosis not present

## 2022-01-16 LAB — CMP (CANCER CENTER ONLY)
ALT: 6 U/L (ref 0–44)
AST: 11 U/L — ABNORMAL LOW (ref 15–41)
Albumin: 3.3 g/dL — ABNORMAL LOW (ref 3.5–5.0)
Alkaline Phosphatase: 89 U/L (ref 38–126)
Anion gap: 5 (ref 5–15)
BUN: 16 mg/dL (ref 8–23)
CO2: 29 mmol/L (ref 22–32)
Calcium: 8.7 mg/dL — ABNORMAL LOW (ref 8.9–10.3)
Chloride: 107 mmol/L (ref 98–111)
Creatinine: 0.95 mg/dL (ref 0.61–1.24)
GFR, Estimated: >60 mL/min (ref >60)
Glucose, Bld: 170 mg/dL — ABNORMAL HIGH (ref 70–99)
Potassium: 3.6 mmol/L (ref 3.5–5.1)
Sodium: 141 mmol/L (ref 135–145)
Total Bilirubin: 0.6 mg/dL (ref 0.3–1.2)
Total Protein: 6.8 g/dL (ref 6.5–8.1)

## 2022-01-16 LAB — CBC WITH DIFFERENTIAL (CANCER CENTER ONLY)
Abs Immature Granulocytes: 0.01 10*3/uL (ref 0.00–0.07)
Basophils Absolute: 0 10*3/uL (ref 0.0–0.1)
Basophils Relative: 1 %
Eosinophils Absolute: 0 10*3/uL (ref 0.0–0.5)
Eosinophils Relative: 0 %
HCT: 31.9 % — ABNORMAL LOW (ref 39.0–52.0)
Hemoglobin: 10.3 g/dL — ABNORMAL LOW (ref 13.0–17.0)
Immature Granulocytes: 0 %
Lymphocytes Relative: 13 %
Lymphs Abs: 0.5 10*3/uL — ABNORMAL LOW (ref 0.7–4.0)
MCH: 24.1 pg — ABNORMAL LOW (ref 26.0–34.0)
MCHC: 32.3 g/dL (ref 30.0–36.0)
MCV: 74.7 fL — ABNORMAL LOW (ref 80.0–100.0)
Monocytes Absolute: 0.3 10*3/uL (ref 0.1–1.0)
Monocytes Relative: 6 %
Neutro Abs: 3.3 10*3/uL (ref 1.7–7.7)
Neutrophils Relative %: 80 %
Platelet Count: 260 10*3/uL (ref 150–400)
RBC: 4.27 MIL/uL (ref 4.22–5.81)
RDW: 18.2 % — ABNORMAL HIGH (ref 11.5–15.5)
WBC Count: 4.1 10*3/uL (ref 4.0–10.5)
nRBC: 0 % (ref 0.0–0.2)

## 2022-01-16 MED ORDER — LEUPROLIDE ACETATE (3 MONTH) 22.5 MG IM KIT: 22.5000 mg | PACK | Freq: Once | INTRAMUSCULAR | Status: DC

## 2022-01-16 MED ORDER — SODIUM CHLORIDE 0.9 % IV SOLN: INTRAVENOUS | Status: DC

## 2022-01-16 MED ORDER — FERROUS SULFATE 325 (65 FE) MG PO TABS
325.0000 mg | ORAL_TABLET | Freq: Every day | ORAL | 3 refills | Status: AC
  Filled 2022-01-16: qty 90, 90d supply, fill #0

## 2022-01-16 MED ORDER — OXYCODONE HCL 5 MG PO TABS
5.0000 mg | ORAL_TABLET | ORAL | 0 refills | Status: DC | PRN
  Filled 2022-01-16: qty 180, 15d supply, fill #0

## 2022-01-16 MED ORDER — ZOLEDRONIC ACID 4 MG/100ML IV SOLN
4.0000 mg | Freq: Once | INTRAVENOUS | Status: AC
  Administered 2022-01-16: 4 mg via INTRAVENOUS
  Filled 2022-01-16: qty 100

## 2022-01-16 MED ORDER — OYSTER SHELL CALCIUM/D3 500-5 MG-MCG PO TABS: 1.0000 | ORAL_TABLET | Freq: Every day | ORAL | 6 refills | Status: AC

## 2022-01-16 NOTE — Progress Notes (Signed)
[place] [phone]   [phone]  PROGRESS NOTE  Patient Care Team: Farrel Gordon, DO as PCP - General (Internal Medicine) Cira Rue, RN Nurse Navigator as Registered Nurse (Medical Oncology) Pickenpack-Cousar, Carlena Sax, NP as Nurse Practitioner (Nurse Practitioner)  Hematological/Oncological History # Metastatic Castrate Sensitive Prostate Cancer, Metastatic to Bone 1) 07/13/2019: Abdomen/Pelvis CT extensive lytic changes with superimposed pathological fractures. Lytic change noted in the right iliac bone and new lucencies in the right T10 vertebrae.  2) 07/19/2019: biopsy of sacral mass shows metastatic prostatic adenocarcinoma, Gleason 4+4.  3) 07/2019: reportedly received Zometa 4g IV and eligard 22.5. Started on Casodex.  4) 6/10-6/16/2021: received palliative radiation to the sacrum at [place]. Received 2000cGy over 6 days 5) Moved to [place]. Lost to follow up from [facility] in [place], [place]. 6) 11/05/2019: establish care with Dr. Lorenso Courier  7) 11/16/2019: Administered Zometa 761m IV and Lupron 22.5 mg 8) 12/08/2019: started abiraterone 1007mPO daily  9) 03/09/2020: Administered Zometa 61m52mV and Lupron 22.5 mg 10) 06/01/2020: Administered Zometa 61mg47m and Lupron 22.5 mg 11) No show for interval zometa/lupron 12) 01/17/2021: Administered Zometa 61mg 35mand Lupron 22.5 mg 13) 04/13/2021: Administered Zometa 61mg I37mnd Lupron 22.5 mg 14) 07/25/2021: Administered Zometa 61mg IV107md Lupron 22.5 mg 15) 10/18/2021: Administered Zometa 61mg IV 18m Lupron 22.5 mg 16) 01/16/2022: Administered Zometa 61mg IV a36mLupron 22.5 mg  Interval History:  Markeis GELIGAH [id number] with medical history significant for metastatic prostate cancer who presents for a follow up visit. The patient's last visit was on 10/17/2020. In the interim since the last visit he has had no major changes in his health.   On exam today Mr. Fullard rPinehe is currently on  oxygen in the clinic room because his oxygen levels dipped to 85% on room air.  He normally uses oxygen only at night but we are providing it for him here in the clinic.  He notes he is having numerous issues including blood in his urine, blood in his stool, and a swollen testicle.  Fortunately he has a urology visit tomorrow to help address these issues.  He notes that he fell about a week and a half ago due to the "poor grip in his shoes".  He notes that he will be talking to his podiatrist about getting better fitting shoes.  He likes having the shoes without laces and they "help me real good".  He notes that he continues take his Zytiga faithfully as not having any major side effects as result of the medications.  He is also tolerating his Lupron shots without any difficulty.  He reports his pain is currently stable and being managed by our palliative care service.  He notes that he is otherwise had no major changes in his health.  His appetite remains good and his bowels are moving well.  He otherwise denies any fevers, chills, sweats, nausea, vomiting or diarrhea.  Full 10 point ROS is listed below.    MEDICAL HISTORY:  Past Medical History:  Diagnosis Date   Asthma    COPD (chronic obstructive pulmonary disease) (HCC)    GERD (gastroesophageal reflux disease)    Hypertension    Prostate cancer (HCC)     RoperGICAL HISTORY: Past Surgical History:  Procedure Laterality Date   ABDOMINAL AORTOGRAM W/LOWER EXTREMITY N/A 10/26/2020   Procedure: ABDOMINAL AORTOGRAM W/LOWER EXTREMITY;  Surgeon: Clark, ChMarty Heckcation: [facility]  Service: Cardiovascular;  Laterality: N/A;   COLONOSCOPY WITH PROPOFOL N/A 05/22/2021   Procedure: COLONOSCOPY WITH PROPOFOL;  Surgeon: Doran Stabler, MD;  Location: [facility];  Service: Gastroenterology;  Laterality: N/A;   ESOPHAGOGASTRODUODENOSCOPY (EGD) WITH PROPOFOL N/A 05/22/2021   Procedure: ESOPHAGOGASTRODUODENOSCOPY (EGD) WITH PROPOFOL;   Surgeon: Doran Stabler, MD;  Location: [facility];  Service: Gastroenterology;  Laterality: N/A;   HEMOSTASIS CLIP PLACEMENT  05/22/2021   Procedure: HEMOSTASIS CLIP PLACEMENT;  Surgeon: Doran Stabler, MD;  Location: [facility];  Service: Gastroenterology;;   HOT HEMOSTASIS N/A 05/22/2021   Procedure: HOT HEMOSTASIS (ARGON PLASMA COAGULATION/BICAP);  Surgeon: Doran Stabler, MD;  Location: [facility];  Service: Gastroenterology;  Laterality: N/A;   WRIST SURGERY Left 2017    SOCIAL HISTORY: Social History   Socioeconomic History   Marital status: Widowed    Spouse name: Not on file   Number of children: Not on file   Years of education: Not on file   Highest education level: Not on file  Occupational History   Not on file  Tobacco Use   Smoking status: Some Days    Packs/day: 1.00    Years: 53.00    Total pack years: 53.00    Types: Cigarettes   Smokeless tobacco: Never   Tobacco comments:    0.5 PPD/Wants patches   Vaping Use   Vaping Use: Never used  Substance and Sexual Activity   Alcohol use: Yes    Comment: 1-2 drinks per week.   Drug use: Not Currently   Sexual activity: Not Currently  Other Topics Concern   Not on file  Social History Narrative   Not on file   Social Determinants of Health   Financial Resource Strain: Not on file  Food Insecurity: Not on file  Transportation Needs: Not on file  Physical Activity: Not on file  Stress: Not on file  Social Connections: Not on file  Intimate Partner Violence: Not on file    FAMILY HISTORY: Family History  Problem Relation Age of Onset   COPD Father    Diabetes Sister    Diabetes Brother    Breast cancer Neg Hx    Prostate cancer Neg Hx    Colon cancer Neg Hx    Pancreatic cancer Neg Hx    Stomach cancer Neg Hx    Esophageal cancer Neg Hx     ALLERGIES:  is allergic to ace inhibitors and lisinopril.  MEDICATIONS:  Current Outpatient Medications  Medication Sig Dispense Refill    ferrous sulfate 325 (65 FE) MG tablet Take 1 tablet (325 mg total) by mouth daily with breakfast. Please take with a source of Vitamin C 90 tablet 3   abiraterone acetate (ZYTIGA) 250 MG tablet TAKE 4 TABLETS BY MOUTH DAILY. TAKE ON AN EMPTY STOMACH 1 HOUR BEFORE OR 2 HOURS AFTER A MEAL 120 tablet 2   albuterol (PROVENTIL) (2.5 MG/3ML) 0.083% nebulizer solution Take 3 mLs (2.5 mg total) by nebulization every 6 (six) hours as needed for wheezing or shortness of breath. 180 mL 5   albuterol (VENTOLIN HFA) 108 (90 Base) MCG/ACT inhaler INHALE TWO puffs into THE lungs EVERY SIX HOURS AS NEEDED wheezing OR For SHORTNESS OF BREATH 6.7 g 3   amLODipine (NORVASC) 5 MG tablet Take 1 tablet (5 mg total) by mouth daily. 90 tablet 1   calcium-vitamin D (OSCAL WITH D) 500-200 MG-UNIT tablet Take 1 tablet by mouth daily with breakfast. 90 tablet 3  calcium-vitamin D (OSCAL WITH D) 500-5 MG-MCG tablet Take 1 tablet by mouth daily with breakfast. 30 tablet 6   docusate sodium (COLACE) 100 MG capsule Take 200 mg by mouth daily.     finasteride (PROSCAR) 5 MG tablet Take 1 tablet (5 mg total) by mouth daily. 90 tablet 3   Fluticasone-Umeclidin-Vilant (TRELEGY ELLIPTA) 200-62.5-25 MCG/ACT AEPB Inhale 1 puff into the lungs daily. Use once daily 60 each 11   gabapentin (NEURONTIN) 300 MG capsule Take 2 capsules (600 mg total) by mouth 2 (two) times daily. 120 capsule 2   lactulose (CHRONULAC) 10 GM/15ML solution Take 30 mLs (20 g total) by mouth 2 (two) times daily as needed for mild constipation. 236 mL 2   lidocaine (LIDODERM) 5 % place ONE PATCH onto THE SKIN daily. REMOVE AND discard WITHIN 12 HOURS OR AS DIRECTED 30 patch 0   nicotine (NICOTROL) 10 MG inhaler Inhale 1 continuous puffing into the lungs as needed for smoking cessation.     oxyCODONE (OXY IR/ROXICODONE) 5 MG immediate release tablet Take 1-2 tablets (5-10 mg total) by mouth every 4 (four) hours as needed for moderate to severe pain, or breakthrough  pain. 180 tablet 0   oxyCODONE ER (XTAMPZA ER) 36 MG C12A Take 1 capsule (36 mg total) by mouth every 8 (eight) hours. 90 capsule 0   pantoprazole (PROTONIX) 40 MG tablet Take 1 tablet (40 mg total) by mouth daily. 90 tablet 3   polyethylene glycol (MIRALAX) 17 g packet Mix 17 grams (1 packet) with 4-8 ounces of liquid and take by mouth 2 (two) times daily. 60 each 2   predniSONE (DELTASONE) 5 MG tablet y1y BY MOUTH EVERY DAY with breakfast .**Do not take prior TO taking Abiraterone** 90 tablet 1   senna-docusate (SENNA S) 8.6-50 MG tablet Take 2 tablets by mouth daily. 60 tablet 2   sildenafil (VIAGRA) 100 MG tablet Take 1 tablet (100 mg total) by mouth daily as needed for erectile dysfunction. 30 tablet 0   silver sulfADIAZINE (SILVADENE) 1 % cream Apply to left great toe once daily. 50 g 1   sorbitol 70 % SOLN Take 15 mLs by mouth daily as needed. 473 mL 0   tamsulosin (FLOMAX) 0.4 MG CAPS capsule Take 1 capsule (0.4 mg total) by mouth in the morning and at bedtime. 180 capsule 0   tiZANidine (ZANAFLEX) 2 MG tablet Take 1 tablet (2 mg total) by mouth 2 (two) times daily as needed for muscle spasms. 45 tablet 1   traZODone (DESYREL) 100 MG tablet Take 0.5 tablets (50 mg total) by mouth at bedtime. 90 tablet 1   No current facility-administered medications for this visit.   Facility-Administered Medications Ordered in Other Visits  Medication Dose Route Frequency Provider Last Rate Last Admin   0.9 %  sodium chloride infusion   Intravenous Continuous [name], MD   Stopped at 01/16/22 1629    REVIEW OF SYSTEMS:   Constitutional: ( - ) fevers, ( - )  chills , ( - ) night sweats Eyes: ( - ) blurriness of vision, ( - ) double vision, ( - ) watery eyes Ears, nose, mouth, throat, and face: ( - ) mucositis, ( - ) sore throat Respiratory: ( - ) cough, ( - ) dyspnea, ( - ) wheezes Cardiovascular: ( - ) palpitation, ( - ) chest discomfort, ( - ) lower extremity swelling Gastrointestinal:   ( - ) nausea, ( - ) heartburn, ( - ) change  in bowel habits Skin: ( - ) abnormal skin rashes Lymphatics: ( - ) new lymphadenopathy, ( - ) easy bruising Neurological: ( - ) numbness, ( - ) tingling, ( - ) new weaknesses Behavioral/Psych: ( - ) mood change, ( - ) new changes  All other systems were reviewed with the patient and are negative.  PHYSICAL EXAMINATION:  Vitals:   01/16/22 1418  BP: 118/68  Pulse: 95  Resp: 17  Temp: 98.2 F (36.8 C)  SpO2: 90%     Filed Weights   01/16/22 1418  Weight: 151 lb 14.4 oz (68.9 kg)   GENERAL: well appearing elderly African American male in NAD. SKIN: skin color, texture, turgor are normal, no rashes or significant lesions EYES: conjunctiva are pink and non-injected, sclera clear LUNGS: clear to auscultation and percussion with normal breathing effort HEART: regular rate & rhythm and no murmurs and no lower extremity edema Musculoskeletal: no cyanosis of digits and no clubbing  PSYCH: alert & oriented x 3, fluent speech NEURO: no focal motor/sensory deficits  LABORATORY DATA:  I have reviewed the data as listed    Latest Ref Rng & Units 01/16/2022    1:52 PM 10/17/2021    2:16 PM 07/18/2021    2:52 PM  CBC  WBC 4.0 - 10.5 K/uL 4.1  5.4  4.3   Hemoglobin 13.0 - 17.0 g/dL 10.3  11.4  12.2   Hematocrit 39.0 - 52.0 % 31.9  35.1  38.9   Platelets 150 - 400 K/uL 260  322  293        Latest Ref Rng & Units 01/16/2022    1:52 PM 10/17/2021    2:16 PM 07/18/2021    2:52 PM  CMP  Glucose 70 - 99 mg/dL 170  162  109   BUN 8 - 23 mg/dL _0 Creatinine 0.61 - 1.24 mg/dL 0.95  0.76  0.79   Sodium 135 - 145 mmol/L 141  142  142   Potassium 3.5 - 5.1 mmol/L 3.6  4.2  3.9   Chloride 98 - 111 mmol/L 107  107  105   CO2 22 - 32 mmol/L _1 Calcium 8.9 - 10.3 mg/dL 8.7  9.2  9.4   Total Protein 6.5 - 8.1 g/dL 6.8  7.5  7.9   Total Bilirubin 0.3 - 1.2 mg/dL 0.6  0.4  0.3   Alkaline Phos 38 - 126 U/L 89  74  84   AST 15 - 41 U/L _2 ALT 0 - 44 U/L _3 RADIOGRAPHIC STUDIES: No results found.  ASSESSMENT & PLAN Dustin Alvarez 66 y.o. male with medical history significant for metastatic prostate cancer who presents for a follow up visit.  Mr. Mcnab has excessively transitioned off the Casodex and has received his first dosages of Zometa and Lupron.  His PSA continues to trend downward and is testosterone is at castrate level.  Given this we started abiraterone therapy 1000 mg p.o. daily with 5 mg of prednisone.   The major symptom the patient has been experiencing has been pain.  His spinal sacral lesion has been poorly controlled on oxycodone therapy.  He did previously receive radiation therapy in June 2021, but due to continued excruciating pain we asked for him to be reexamined by radiation oncology to see if there is any further intervention they can  offer at this time. He has had further palliative radiation, though this has not relieved his pain. Continue OxyContin to 30 mg twice daily (per his request, issues with constipation at higher doses) and additionally continue his as needed fast acting oxycodone. We will continue gabapentin at 338m BID.  We will plan to have him back in 12 weeks for clinic visit.  PSA 07/13/2019: PSA 819 08/27/2019: 226 11/05/2019: 44.8 12/08/2019: 27.3 12/27/2019: 21.2  02/24/2020: 12.1 03/22/2020: PSA 8.8 05/18/2020: PSA 4.1 07/31/2020: PSA 2.8 11/16/2020: PSA 1.6 07/18/2021: PSA 0.8  # Metastatic Castrate Sensitive Prostate Cancer, Metastatic to Bone --findings are most consistent with metastatic adenocarcinoma of the prostate.  --testosterone at last check was <3, PSA down to 1.0 ( from 819 at diagnosis)  --Administered  Lupron 22.5 mg and zometa today. Continue q 3 months.  --continue abiraterone 10061mwith prednisone 71m40mO daily. Assure he is compliant with the full dose and steroids.  --patient due for a repeat CT scan and NM bone scan in August 2023. Repeat q 6  months or based on PSA response.  --Labs today show white blood cell count 4.1, hemoglobin 10.3, MCV 74.7, and platelets of 260.  Additionally calcium is 8.7.  Recommend starting p.o. supplementation with ferrous sulfate 325 mg p.o. daily and calcium/vitamin D. --RTC in 12 weeks for continued monitoring on Abiraterone therapy +zometa/lupron.   # Erectile Dysfunction # Testicular Pain/Hematuria --patient trialed on cialis and Viagra with sub-optimal results --patient requesting "something stronger" --referred to urology for assistance in managing his ED.    #Pain Control --gabapentin 600 mg BID to help with pain.  --continue oxycodone 5-32m64mH PRN for pain control. --continue Xtampza 36 mg q12H for basal pain control --established care with Radiation Oncology to help with painful bone lesions. Radiation therapy completed 02/01/2021.  --seen by neurosurgery, provided injections with no relief.  -- Met with vascular surgery regarding the concern for poor circulation of the lower extremities.  He declined operative management.  They plan to see him back to monitor ABIs --patient established care with palliative care for management of his pain.   No orders of the defined types were placed in this encounter.   All questions were answered. The patient knows to call the clinic with any problems, questions or concerns.  A total of more than 30 minutes were spent on this encounter and over half of that time was spent on counseling and coordination of care as outlined above.   JohnLedell Peoples Department of Hematology/Oncology [facility]: [phone]: [phone]: [email or web address]  01/16/2022 5:06 PM

## 2022-01-16 NOTE — Patient Instructions (Signed)
Zoledronic Acid Injection (Cancer) What is this medication? ZOLEDRONIC ACID (ZOE le dron ik AS id) treats high calcium levels in the blood caused by cancer. It may also be used with chemotherapy to treat weakened bones caused by cancer. It works by slowing down the release of calcium from bones. This lowers calcium levels in your blood. It also makes your bones stronger and less likely to break (fracture). It belongs to a group of medications called bisphosphonates. This medicine may be used for other purposes; ask your health care provider or pharmacist if you have questions. COMMON BRAND NAME(S): Zometa, Zometa Powder What should I tell my care team before I take this medication? They need to know if you have any of these conditions: Dehydration Dental disease Kidney disease Liver disease Low levels of calcium in the blood Lung or breathing disease, such as asthma Receiving steroids, such as dexamethasone or prednisone An unusual or allergic reaction to zoledronic acid, other medications, foods, dyes, or preservatives Pregnant or trying to get pregnant Breast-feeding How should I use this medication? This medication is injected into a vein. It is given by your care team in a hospital or clinic setting. Talk to your care team about the use of this medication in children. Special care may be needed. Overdosage: If you think you have taken too much of this medicine contact a poison control center or emergency room at once. NOTE: This medicine is only for you. Do not share this medicine with others. What if I miss a dose? Keep appointments for follow-up doses. It is important not to miss your dose. Call your care team if you are unable to keep an appointment. What may interact with this medication? Certain antibiotics given by injection Diuretics, such as bumetanide, furosemide NSAIDs, medications for pain and inflammation, such as ibuprofen or naproxen Teriparatide Thalidomide This list  may not describe all possible interactions. Give your health care provider a list of all the medicines, herbs, non-prescription drugs, or dietary supplements you use. Also tell them if you smoke, drink alcohol, or use illegal drugs. Some items may interact with your medicine. What should I watch for while using this medication? Visit your care team for regular checks on your progress. It may be some time before you see the benefit from this medication. Some people who take this medication have severe bone, joint, or muscle pain. This medication may also increase your risk for jaw problems or a broken thigh bone. Tell your care team right away if you have severe pain in your jaw, bones, joints, or muscles. Tell you care team if you have any pain that does not go away or that gets worse. Tell your dentist and dental surgeon that you are taking this medication. You should not have major dental surgery while on this medication. See your dentist to have a dental exam and fix any dental problems before starting this medication. Take good care of your teeth while on this medication. Make sure you see your dentist for regular follow-up appointments. You should make sure you get enough calcium and vitamin D while you are taking this medication. Discuss the foods you eat and the vitamins you take with your care team. Check with your care team if you have severe diarrhea, nausea, and vomiting, or if you sweat a lot. The loss of too much body fluid may make it dangerous for you to take this medication. You may need bloodwork while taking this medication. Talk to your care team if   you wish to become pregnant or think you might be pregnant. This medication can cause serious birth defects. What side effects may I notice from receiving this medication? Side effects that you should report to your care team as soon as possible: Allergic reactions--skin rash, itching, hives, swelling of the face, lips, tongue, or  throat Kidney injury--decrease in the amount of urine, swelling of the ankles, hands, or feet Low calcium level--muscle pain or cramps, confusion, tingling, or numbness in the hands or feet Osteonecrosis of the jaw--pain, swelling, or redness in the mouth, numbness of the jaw, poor healing after dental work, unusual discharge from the mouth, visible bones in the mouth Severe bone, joint, or muscle pain Side effects that usually do not require medical attention (report to your care team if they continue or are bothersome): Constipation Fatigue Fever Loss of appetite Nausea Stomach pain This list may not describe all possible side effects. Call your doctor for medical advice about side effects. You may report side effects to FDA at 1-800-FDA-1088. Where should I keep my medication? This medication is given in a hospital or clinic. It will not be stored at home. NOTE: This sheet is a summary. It may not cover all possible information. If you have questions about this medicine, talk to your doctor, pharmacist, or health care provider. Leuprolide Solution for Injection What is this medication? LEUPROLIDE (loo PROE lide) reduces the symptoms of prostate cancer. It works by decreasing levels of the hormone testosterone in the body. This prevents prostate cancer cells from spreading or growing. This medicine may be used for other purposes; ask your health care provider or pharmacist if you have questions. COMMON BRAND NAME(S): Lupron What should I tell my care team before I take this medication? They need to know if you have any of these conditions: Diabetes Heart attack Heart disease High blood pressure High cholesterol Pain or difficulty passing urine Spinal cord metastasis Stroke Tobacco use An unusual or allergic reaction to leuprolide, other medications, foods, dyes, or preservatives Pregnant or trying to get pregnant Breast-feeding How should I use this medication? This medication  is for injection under the skin or into a muscle. You will be taught how to prepare and give this medication. Use exactly as directed. Take your medication at regular intervals. Do not take it more often than directed. It is important that you put your used needles and syringes in a special sharps container. Do not put them in a trash can. If you do not have a sharps container, call your care team to get one. A special MedGuide will be given to you by the pharmacist with each prescription and refill. Be sure to read this information carefully each time. Talk to your care team about the use of this medication in children. While this medication may be prescribed for children as young as 8 years for selected conditions, precautions do apply. Overdosage: If you think you have taken too much of this medicine contact a poison control center or emergency room at once. NOTE: This medicine is only for you. Do not share this medicine with others. What if I miss a dose? If you miss a dose, take it as soon as you can. If it is almost time for your next dose, take only that dose. Do not take double or extra doses. What may interact with this medication? Do not take this medication with any of the following: Chasteberry Cisapride Dronedarone Pimozide Thioridazine This medication may also interact with the following:  Estrogen or progestin hormones Herbal or dietary supplements, like black cohosh or DHEA Other medications that cause heart rhythm changes Testosterone This list may not describe all possible interactions. Give your health care provider a list of all the medicines, herbs, non-prescription drugs, or dietary supplements you use. Also tell them if you smoke, drink alcohol, or use illegal drugs. Some items may interact with your medicine. What should I watch for while using this medication? Visit your care team for regular checks on your progress. During the first week, your symptoms may get worse,  but then will improve as you continue your treatment. You may get hot flashes, increased bone pain, increased difficulty passing urine, or an aggravation of nerve symptoms. Discuss these effects with your care team, some of them may improve with continued use of this medication. Patients may experience a menstrual cycle or spotting during the first 2 months of therapy with this medication. If this continues, contact your care team. This medication may increase blood sugar. The risk may be higher in patients who already have diabetes. Ask your care team what you can do to lower your risk of diabetes while taking this medication. What side effects may I notice from receiving this medication? Side effects that you should report to your care team as soon as possible: Allergic reactions--skin rash, itching, hives, swelling of the face, lips, tongue, or throat Heart attack--pain or tightness in the chest, shoulders, arms, or jaw, nausea, shortness of breath, cold or clammy skin, feeling faint or lightheaded Heart rhythm changes--fast or irregular heartbeat, dizziness, feeling faint or lightheaded, chest pain, trouble breathing High blood sugar (hyperglycemia)--increased thirst or amount of urine, unusual weakness or fatigue, blurry vision Mood swings, irritability, hostility Seizures Stroke--sudden numbness or weakness of the face, arm, or leg, trouble speaking, confusion, trouble walking, loss of balance or coordination, dizziness, severe headache, change in vision Thoughts of suicide or self-harm, worsening mood, feelings of depression Side effects that usually do not require medical attention (report to your care team if they continue or are bothersome): Bone pain Change in sex drive or performance General discomfort and fatigue Hot flashes Muscle pain Pain, redness, or irritation at injection site Swelling of the ankles, hands, or feet This list may not describe all possible side effects. Call  your doctor for medical advice about side effects. You may report side effects to FDA at 1-800-FDA-1088. Where should I keep my medication? Keep out of the reach of children and pets. Store below 25 degrees C (77 degrees F). Do not freeze. Protect from light. Get rid of any unused medication after the expiration date. To get rid of medications that are no longer needed or have expired: Take the medication to a medication take-back program. Check with your pharmacy or law enforcement to find a location. If you cannot return the medication, ask your pharmacist or care team how to get rid of this medication safely. NOTE: This sheet is a summary. It may not cover all possible information. If you have questions about this medicine, talk to your doctor, pharmacist, or health care provider.  2023 Elsevier/Gold Standard (2020-12-29 00:00:00)   2023 Elsevier/Gold Standard (2021-03-15 00:00:00)

## 2022-01-16 NOTE — Progress Notes (Signed)
Ok for zometa today with a calcium of 8.7.

## 2022-01-16 NOTE — Progress Notes (Signed)
[facility]  [phone] [phone]   Name: Dustin Alvarez Date: 01/16/2022 MRN: [id number]  DOB: 28-Dec-1955  Patient Care Team: Dustin Gordon, DO as PCP - General (Internal Medicine) Dustin Rue, RN Nurse Navigator as Registered Nurse (Medical Oncology) Dustin Alvarez, Dustin Sax, NP as Nurse Practitioner (Nurse Practitioner)   REASON FOR CONSULTATION: Dustin Alvarez is a 66 y.o. 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  Palliative ask to see for symptom management and goals of care.    SOCIAL HISTORY:     reports that he has been smoking cigarettes. He has a 53.00 pack-year smoking history. He has never used smokeless tobacco. He reports current alcohol use. He reports that he does not currently use drugs.  ADVANCE DIRECTIVES:  Patient reports his children would be his medical decision makers. Does have MOST on file. Personally reviewed an discussed with no requested changes.    CODE STATUS: DNR  PAST MEDICAL HISTORY: Past Medical History:  Diagnosis Date   Asthma    COPD (chronic obstructive pulmonary disease) (HCC)    GERD (gastroesophageal reflux disease)    Hypertension    Prostate cancer ([place])     PAST SURGICAL HISTORY:  Past Surgical History:  Procedure Laterality Date   ABDOMINAL AORTOGRAM W/LOWER EXTREMITY N/A 10/26/2020   Procedure: ABDOMINAL AORTOGRAM W/LOWER EXTREMITY;  Surgeon: Dustin Heck, MD;  Location: [facility];  Service: Cardiovascular;  Laterality: N/A;   COLONOSCOPY WITH PROPOFOL N/A 05/22/2021   Procedure: COLONOSCOPY WITH PROPOFOL;  Surgeon: Dustin Stabler, MD;  Location: [facility];  Service: Gastroenterology;  Laterality: N/A;   ESOPHAGOGASTRODUODENOSCOPY (EGD) WITH PROPOFOL N/A 05/22/2021    Procedure: ESOPHAGOGASTRODUODENOSCOPY (EGD) WITH PROPOFOL;  Surgeon: Dustin Stabler, MD;  Location: [facility];  Service: Gastroenterology;  Laterality: N/A;   HEMOSTASIS CLIP PLACEMENT  05/22/2021   Procedure: HEMOSTASIS CLIP PLACEMENT;  Surgeon: Dustin Stabler, MD;  Location: [facility];  Service: Gastroenterology;;   HOT HEMOSTASIS N/A 05/22/2021   Procedure: HOT HEMOSTASIS (ARGON PLASMA COAGULATION/BICAP);  Surgeon: Dustin Stabler, MD;  Location: [facility];  Service: Gastroenterology;  Laterality: N/A;   WRIST SURGERY Left 2017    HEMATOLOGY/ONCOLOGY HISTORY:  Oncology History   No history exists.    ALLERGIES:  is allergic to ace inhibitors and lisinopril.  MEDICATIONS:  Current Outpatient Medications  Medication Sig Dispense Refill   calcium-vitamin D (OSCAL WITH D) 500-5 MG-MCG tablet Take 1 tablet by mouth daily with breakfast. 30 tablet 6   abiraterone acetate (ZYTIGA) 250 MG tablet TAKE 4 TABLETS BY MOUTH DAILY. TAKE ON AN EMPTY STOMACH 1 HOUR BEFORE OR 2 HOURS AFTER A MEAL 120 tablet 2   albuterol (PROVENTIL) (2.5 MG/3ML) 0.083% nebulizer solution Take 3 mLs (2.5 mg total) by nebulization every 6 (six) hours as needed for wheezing or shortness of breath. 180 mL 5   albuterol (VENTOLIN HFA) 108 (90 Base) MCG/ACT inhaler INHALE TWO puffs into THE lungs EVERY SIX HOURS AS NEEDED wheezing OR For SHORTNESS OF BREATH 6.7 g 3   amLODipine (NORVASC) 5 MG tablet Take 1 tablet (5 mg total) by mouth daily. 90 tablet 1   calcium-vitamin D (OSCAL WITH D) 500-200 MG-UNIT tablet Take 1 tablet by mouth daily with breakfast. 90 tablet 3   docusate sodium (COLACE) 100 MG capsule Take  200 mg by mouth daily.     ferrous sulfate 325 (65 FE) MG tablet Take 1 tablet (325 mg total) by mouth daily with breakfast. Please take with a source of Vitamin C 90 tablet 3   finasteride (PROSCAR) 5 MG tablet Take 1 tablet (5 mg total) by mouth daily. 90 tablet 3   Fluticasone-Umeclidin-Vilant  (TRELEGY ELLIPTA) 200-62.5-25 MCG/ACT AEPB Inhale 1 puff into the lungs daily. Use once daily 60 each 11   gabapentin (NEURONTIN) 300 MG capsule Take 2 capsules (600 mg total) by mouth 2 (two) times daily. 120 capsule 2   lactulose (CHRONULAC) 10 GM/15ML solution Take 30 mLs (20 g total) by mouth 2 (two) times daily as needed for mild constipation. 236 mL 2   lidocaine (LIDODERM) 5 % place ONE PATCH onto THE SKIN daily. REMOVE AND discard WITHIN 12 HOURS OR AS DIRECTED 30 patch 0   nicotine (NICOTROL) 10 MG inhaler Inhale 1 continuous puffing into the lungs as needed for smoking cessation.     oxyCODONE (OXY IR/ROXICODONE) 5 MG immediate release tablet Take 1-2 tablets (5-10 mg total) by mouth every 4 (four) hours as needed for severe pain, breakthrough pain or moderate pain. 180 tablet 0   oxyCODONE ER (XTAMPZA ER) 36 MG C12A Take 1 capsule (36 mg total) by mouth every 8 (eight) hours. 90 capsule 0   pantoprazole (PROTONIX) 40 MG tablet Take 1 tablet (40 mg total) by mouth daily. 90 tablet 3   polyethylene glycol (MIRALAX) 17 g packet Mix 17 grams (1 packet) with 4-8 ounces of liquid and take by mouth 2 (two) times daily. 60 each 2   predniSONE (DELTASONE) 5 MG tablet y1y BY MOUTH EVERY DAY with breakfast .**Do not take prior TO taking Abiraterone** 90 tablet 1   senna-docusate (SENNA S) 8.6-50 MG tablet Take 2 tablets by mouth daily. 60 tablet 2   sildenafil (VIAGRA) 100 MG tablet Take 1 tablet (100 mg total) by mouth daily as needed for erectile dysfunction. 30 tablet 0   silver sulfADIAZINE (SILVADENE) 1 % cream Apply to left great toe once daily. 50 g 1   sorbitol 70 % SOLN Take 15 mLs by mouth daily as needed. 473 mL 0   tamsulosin (FLOMAX) 0.4 MG CAPS capsule Take 1 capsule (0.4 mg total) by mouth in the morning and at bedtime. 180 capsule 0   tiZANidine (ZANAFLEX) 2 MG tablet Take 1 tablet (2 mg total) by mouth 2 (two) times daily as needed for muscle spasms. 45 tablet 1   traZODone (DESYREL)  100 MG tablet Take 0.5 tablets (50 mg total) by mouth at bedtime. 90 tablet 1   No current facility-administered medications for this visit.    VITAL SIGNS: There were no vitals taken for this visit. There were no vitals filed for this visit.   Estimated body mass index is 21.8 kg/m as calculated from the following:   Height as of 11/13/21: '5\' 10"'$  (1.778 m).   Weight as of an earlier encounter on 01/16/22: 151 lb 14.4 oz (68.9 kg).   PERFORMANCE STATUS (ECOG) : 1 - Symptomatic but completely ambulatory  IMPRESSION: I saw Mr. Kotowski today during his infusion. Unfortunately several weeks ago he had a fall after stepping off of an elevator in his apartment complex. States he was sore for several days. He now has a swollen scrotum with reports hematuria. Denies constipation. Ambulating with a cane for support. Daughter confirms he has an appointment tomorrow with his Urologist and was  also seen in urgent care previously. He is complaining of ongoing lower back pain.    1.  Neoplasm related pain Mr. Pisarski states pain is well controlled on current regimen. Tolerating regimen without concern. Some increase in his pain/discomfort since recent fall.  He does have tizanidine on hand which she can use as needed for muscle spasms.  We reviewed at length his current pain regimen which consists of Xtampza 36 mg every 8 hours, Oxy IR 5-10 mg every 4 hours as needed for breakthrough pain, and gabapentin 600 mg twice daily. Per patient and daughter he is taking all medications as prescribed. He is not having to take his breakthrough medication around-the-clock.  His last oxycodone prescription was filled on 11/7.  Advised we will send in a refill today to have on hand.  No changes in current regimen.  Encourage patient to keep all upcoming follow-up appointments with specialist.  We will continue to closely monitor and adjust as needed.  2.  Constipation Constipation much improved on regimen. He is  taking Senna twice daily. We discussed use of lactulose as he does have some constipation at times despite senna.    PLAN:  Tizanidine '2mg'$  every 12 hours as needed for muscle spasms Xtampza 36 mg to every 8 hours. Tolerating well. Pain controlled.  5-10 mg Oxy IR every 4 hours as needed for breakthrough pain as prescribed.  Gabapentin 600 mg twice daily.   Lidocaine patch to lower back Lactulose as needed for constipation Senna-S twice daily  Ongoing goals of care discussion.  I will plan to see patient back in 3-4 weeks in collaboration to other oncology appointments.    Patient expressed understanding and was in agreement with this plan. He also understands that He can call the clinic at any time with any questions, concerns, or complaints.    Any controlled substances utilized were prescribed in the context of palliative care. PDMP has been reviewed.    Time Total: 35 min  Visit consisted of counseling and education dealing with the complex and emotionally intense issues of symptom management and palliative care in the setting of serious and potentially life-threatening illness.Greater than 50%  of this time was spent counseling and coordinating care related to the above assessment and plan.  Alda Lea, AGPCNP-BC  Palliative Medicine Team/[facility]

## 2022-01-17 ENCOUNTER — Telehealth: Payer: Self-pay | Admitting: Hematology and Oncology

## 2022-01-17 DIAGNOSIS — R310 Gross hematuria: Secondary | ICD-10-CM | POA: Diagnosis not present

## 2022-01-17 DIAGNOSIS — C7951 Secondary malignant neoplasm of bone: Secondary | ICD-10-CM | POA: Diagnosis not present

## 2022-01-17 LAB — TESTOSTERONE: Testosterone: <3 ng/dL — ABNORMAL LOW (ref 264–916)

## 2022-01-17 LAB — PROSTATE-SPECIFIC AG, SERUM (LABCORP): Prostate Specific Ag, Serum: 1.6 ng/mL (ref 0.0–4.0)

## 2022-01-17 NOTE — Telephone Encounter (Signed)
Per 12/7 IB, message left with pt's daughter

## 2022-01-21 ENCOUNTER — Inpatient Hospital Stay: Payer: Medicare Other

## 2022-01-21 ENCOUNTER — Other Ambulatory Visit: Payer: Self-pay

## 2022-01-21 VITALS — BP 127/84 | HR 95 | Temp 98.6°F | Resp 22

## 2022-01-21 DIAGNOSIS — Z515 Encounter for palliative care: Secondary | ICD-10-CM | POA: Diagnosis not present

## 2022-01-21 DIAGNOSIS — C61 Malignant neoplasm of prostate: Secondary | ICD-10-CM

## 2022-01-21 DIAGNOSIS — C7951 Secondary malignant neoplasm of bone: Secondary | ICD-10-CM | POA: Diagnosis not present

## 2022-01-21 DIAGNOSIS — G893 Neoplasm related pain (acute) (chronic): Secondary | ICD-10-CM | POA: Diagnosis not present

## 2022-01-21 DIAGNOSIS — Z79891 Long term (current) use of opiate analgesic: Secondary | ICD-10-CM | POA: Diagnosis not present

## 2022-01-21 MED ORDER — LEUPROLIDE ACETATE (3 MONTH) 22.5 MG IM KIT
22.5000 mg | PACK | Freq: Once | INTRAMUSCULAR | Status: AC
  Administered 2022-01-21: 22.5 mg via INTRAMUSCULAR
  Filled 2022-01-21: qty 22.5

## 2022-01-23 ENCOUNTER — Ambulatory Visit (HOSPITAL_BASED_OUTPATIENT_CLINIC_OR_DEPARTMENT_OTHER): Payer: Medicare Other | Admitting: Pulmonary Disease

## 2022-01-25 ENCOUNTER — Ambulatory Visit (INDEPENDENT_AMBULATORY_CARE_PROVIDER_SITE_OTHER): Payer: Medicare Other | Admitting: Pulmonary Disease

## 2022-01-25 ENCOUNTER — Encounter (HOSPITAL_BASED_OUTPATIENT_CLINIC_OR_DEPARTMENT_OTHER): Payer: Self-pay | Admitting: Pulmonary Disease

## 2022-01-25 ENCOUNTER — Telehealth (HOSPITAL_BASED_OUTPATIENT_CLINIC_OR_DEPARTMENT_OTHER): Payer: Self-pay | Admitting: *Deleted

## 2022-01-25 VITALS — BP 130/60 | HR 85 | Ht 70.0 in | Wt 159.2 lb

## 2022-01-25 DIAGNOSIS — J9611 Chronic respiratory failure with hypoxia: Secondary | ICD-10-CM | POA: Diagnosis not present

## 2022-01-25 NOTE — Patient Instructions (Addendum)
Emphysema --CONTINUE Trelegy 200-62.5-25 mcg ONE puff ONCE a day.  --CONTINUE Albuterol TWO puffs AS NEEDED for shortness of breath or wheezing. REFILL --CONTINUE Albuterol nebs AS NEEDED --CONTINUE prednisone 5 mg daily with Oncology --REFER to pulmonary rehab  Chronic hypoxemic respiratory failure  --WEAR 2L continuous oxygen with rest and sleep --WEAR 5L pulsed oxygen with activity  Small pericardial effusion --ORDER echocardiogram complete  Follow-up with me in 2 months

## 2022-01-25 NOTE — Progress Notes (Signed)
Subjective:   PATIENT ID: [name] GENDER: male DOB: 10/18/1955, MRN: [id number]   HPI  Chief Complaint  Patient presents with   Follow-up    Not feeling well    Reason for Visit: Follow-up  Mr. Sanjuan Sawa is 66 year old male active smoker with metastatic prostate cancer with bone involvement s/p Lupron injection and Zometa 01/17/21, COPD, HTN, GERD, IDA who presents for follow-up.  He was diagnosed with COPD in 2013. He is on Trelegy and Albuterol. He uses albuterol twice a day. He is currently smoking 1/2 ppd. He reports shortness of breath, coughing or wheezing, congested cough daily. Uses a cane however does not ambulate very often due to leg pain. Has had worsening right leg pain and swelling. Previously had ultrasound which was negative but now worsening skin tightness. He has 6 exacerbations a year. No hospitalizations.  10/08/21 Daughter is present and provides history. Compliant with Trelegy however reports shortness of breath, cough and wheezing is unchanged. Denies recent fevers/chills. Heat will worsen it. Reports that his O2 sats were low to 88% at his PCP office last week and recovered with rest. Reportedly he was treated with nebulizers in the office but not sent home on steroids or antibiotics. He is compliant with nightly oxygen. Does not check his O2 levels during the day. He continues to smoke.  01/25/22 He reports overall feeling fine. Reports shortness of breath with walking mainly uphill or long distances. Reports improved cough with occasional phlegm production. Wheezing unchanged. Only wearing oxygen at night. He is still smoking 1/2 ppd. Uses cane at home but does have walker. He reports basic exercises with barbells every morning. He is compliant with Trelegy and uses rescue inhaler twice a day  Social History: Active smoker. 53 pack years.  Past Medical History:  Diagnosis Date   Asthma    COPD (chronic obstructive pulmonary disease) (HCC)     GERD (gastroesophageal reflux disease)    Hypertension    Prostate cancer ([place])      Family History  Problem Relation Age of Onset   COPD Father    Diabetes Sister    Diabetes Brother    Breast cancer Neg Hx    Prostate cancer Neg Hx    Colon cancer Neg Hx    Pancreatic cancer Neg Hx    Stomach cancer Neg Hx    Esophageal cancer Neg Hx      Social History   Occupational History   Not on file  Tobacco Use   Smoking status: Some Days    Packs/day: 1.00    Years: 53.00    Total pack years: 53.00    Types: Cigarettes   Smokeless tobacco: Never   Tobacco comments:    0.5 PPD/Wants patches   Vaping Use   Vaping Use: Never used  Substance and Sexual Activity   Alcohol use: Yes    Comment: 1-2 drinks per week.   Drug use: Not Currently   Sexual activity: Not Currently    Allergies  Allergen Reactions   Ace Inhibitors Swelling    lisinopril   Lisinopril Swelling     Outpatient Medications Prior to Visit  Medication Sig Dispense Refill   abiraterone acetate (ZYTIGA) 250 MG tablet TAKE 4 TABLETS BY MOUTH DAILY. TAKE ON AN EMPTY STOMACH 1 HOUR BEFORE OR 2 HOURS AFTER A MEAL 120 tablet 2   albuterol (PROVENTIL) (2.5 MG/3ML) 0.083% nebulizer solution Take 3 mLs (2.5 mg total) by nebulization every 6 (  six) hours as needed for wheezing or shortness of breath. 180 mL 5   albuterol (VENTOLIN HFA) 108 (90 Base) MCG/ACT inhaler INHALE TWO puffs into THE lungs EVERY SIX HOURS AS NEEDED wheezing OR For SHORTNESS OF BREATH 6.7 g 3   amLODipine (NORVASC) 5 MG tablet Take 1 tablet (5 mg total) by mouth daily. 90 tablet 1   calcium-vitamin D (OSCAL WITH D) 500-200 MG-UNIT tablet Take 1 tablet by mouth daily with breakfast. 90 tablet 3   calcium-vitamin D (OSCAL WITH D) 500-5 MG-MCG tablet Take 1 tablet by mouth daily with breakfast. 30 tablet 6   docusate sodium (COLACE) 100 MG capsule Take 200 mg by mouth daily.     ferrous sulfate 325 (65 FE) MG tablet Take 1 tablet (325 mg total) by  mouth daily with breakfast. Please take with a source of Vitamin C 90 tablet 3   finasteride (PROSCAR) 5 MG tablet Take 1 tablet (5 mg total) by mouth daily. 90 tablet 3   Fluticasone-Umeclidin-Vilant (TRELEGY ELLIPTA) 200-62.5-25 MCG/ACT AEPB Inhale 1 puff into the lungs daily. Use once daily 60 each 11   gabapentin (NEURONTIN) 300 MG capsule Take 2 capsules (600 mg total) by mouth 2 (two) times daily. 120 capsule 2   lactulose (CHRONULAC) 10 GM/15ML solution Take 30 mLs (20 g total) by mouth 2 (two) times daily as needed for mild constipation. 236 mL 2   lidocaine (LIDODERM) 5 % place ONE PATCH onto THE SKIN daily. REMOVE AND discard WITHIN 12 HOURS OR AS DIRECTED 30 patch 0   nicotine (NICOTROL) 10 MG inhaler Inhale 1 continuous puffing into the lungs as needed for smoking cessation.     oxyCODONE (OXY IR/ROXICODONE) 5 MG immediate release tablet Take 1-2 tablets (5-10 mg total) by mouth every 4 (four) hours as needed for moderate to severe pain, or breakthrough pain. 180 tablet 0   oxyCODONE ER (XTAMPZA ER) 36 MG C12A Take 1 capsule (36 mg total) by mouth every 8 (eight) hours. 90 capsule 0   pantoprazole (PROTONIX) 40 MG tablet Take 1 tablet (40 mg total) by mouth daily. 90 tablet 3   polyethylene glycol (MIRALAX) 17 g packet Mix 17 grams (1 packet) with 4-8 ounces of liquid and take by mouth 2 (two) times daily. 60 each 2   predniSONE (DELTASONE) 5 MG tablet y1y BY MOUTH EVERY DAY with breakfast .**Do not take prior TO taking Abiraterone** 90 tablet 1   senna-docusate (SENNA S) 8.6-50 MG tablet Take 2 tablets by mouth daily. 60 tablet 2   sildenafil (VIAGRA) 100 MG tablet Take 1 tablet (100 mg total) by mouth daily as needed for erectile dysfunction. 30 tablet 0   sorbitol 70 % SOLN Take 15 mLs by mouth daily as needed. 473 mL 0   tamsulosin (FLOMAX) 0.4 MG CAPS capsule Take 1 capsule (0.4 mg total) by mouth in the morning and at bedtime. 180 capsule 0   tiZANidine (ZANAFLEX) 2 MG tablet Take 1  tablet (2 mg total) by mouth 2 (two) times daily as needed for muscle spasms. 45 tablet 1   traZODone (DESYREL) 100 MG tablet Take 0.5 tablets (50 mg total) by mouth at bedtime. 90 tablet 1   silver sulfADIAZINE (SILVADENE) 1 % cream Apply to left great toe once daily. (Patient not taking: Reported on 01/25/2022) 50 g 1   No facility-administered medications prior to visit.    Review of Systems  Constitutional:  Negative for chills, diaphoresis, fever, malaise/fatigue and weight loss.  HENT:  Negative for congestion.   Respiratory:  Positive for cough, sputum production, shortness of breath and wheezing. Negative for hemoptysis.   Cardiovascular:  Negative for chest pain, palpitations and leg swelling.     Objective:   Vitals:   01/25/22 0958  BP: 130/60  Pulse: 85  SpO2: (!) 84%  Weight: 159 lb 2.8 oz (72.2 kg)  Height: '5\' 10"'$  (1.778 m)   SpO2: (!) 84 % (placed on 2L recovered to 91%) O2 Device: Nasal cannula O2 Flow Rate (L/min): 2 L/min O2 Type: Continuous O2  Physical Exam: General: Chronically ill-appearing, no acute distress HENT: [place], AT Eyes: EOMI, no scleral icterus Respiratory: Diminished to auscultation bilaterally.  No crackles, wheezing or rales Cardiovascular: RRR, -M/R/G, no JVD Extremities:-Edema,-tenderness Neuro: AAO x4, CNII-XII grossly intact Psych: Normal mood, normal affect  Data Reviewed:  Imaging: CT CAP 04/04/21 - Centrilobular and paraseptal emphysema. No pulmonary nodules CXR 10/08/21 - No focal infiltrate, pulmonary edema or effusion CT CAP 11/01/21 - Bandlike scarring in RUL, emphysema, small pericardial effusion. No mets to abdomen and pelvis  PFT: None on file  Labs: CBC    Component Value Date/Time   WBC 4.1 01/16/2022 1352   WBC 6.3 05/22/2021 0955   RBC 4.27 01/16/2022 1352   HGB 10.3 (L) 01/16/2022 1352   HCT 31.9 (L) 01/16/2022 1352   PLT 260 01/16/2022 1352   MCV 74.7 (L) 01/16/2022 1352   MCH 24.1 (L) 01/16/2022 1352    MCHC 32.3 01/16/2022 1352   RDW 18.2 (H) 01/16/2022 1352   LYMPHSABS 0.5 (L) 01/16/2022 1352   MONOABS 0.3 01/16/2022 1352   EOSABS 0.0 01/16/2022 1352   BASOSABS 0.0 01/16/2022 1352   Absolute 05/17/21 - 0     Assessment & Plan:   Discussion: 66 year old male active smoker with COPD, chronic hypoxemic respiratory failure, metastatic prostate cancer with bone involvement s/p Lupron injection Zometa, HTN, GERD and IDA who presents for follow-up. Ambulatory O2 with desaturations as noted below. Reviewed recent CT with pericardial effusion. Discussed clinical course and management of COPD including bronchodilator regimen and action plan for exacerbation.  Emphysema --CONTINUE Trelegy 200-62.5-25 mcg ONE puff ONCE a day.  --CONTINUE Albuterol TWO puffs AS NEEDED for shortness of breath or wheezing. REFILL --CONTINUE Albuterol nebs AS NEEDED --CONTINUE prednisone 5 mg daily with Oncology --REFER to pulmonary rehab  Chronic hypoxemic respiratory failure  --WEAR 2L continuous oxygen with rest and sleep --WEAR 5L pulsed oxygen with activity --Order POC today  Small pericardial effusion previously seen on CT --ORDER echocardiogram complete  Tobacco abuse Discussed clinical course and management of COPD/asthma including bronchodilator regimen and action plan for exacerbation.  Metastatic prostate cancer - no progressive findings on 10/2021 imaging Following Oncology Pain control by Oncology  Health Maintenance Immunization History  Administered Date(s) Administered   Influenza,inj,Quad PF,6+ Mos 11/01/2019   Pneumococcal Conjugate-13 11/01/2019   Tdap 11/01/2019   CT Lung Screen - not due. Once cancer surveillance completed, consider enrolling  Orders Placed This Encounter  Procedures   AMB referral to pulmonary rehabilitation    Referral Priority:   Routine    Referral Type:   Consultation    Number of Visits Requested:   1   Ambulatory Referral for DME    Referral Priority:    Routine    Referral Type:   Durable Medical Equipment Purchase    Number of Visits Requested:   1   ECHOCARDIOGRAM COMPLETE BUBBLE STUDY    Standing Status:  Future    Standing Expiration Date:   01/26/2023    Order Specific Question:   Where should this test be performed    Answer:   [facility]    Order Specific Question:   Perflutren DEFINITY (image enhancing agent) should be administered unless hypersensitivity or allergy exist    Answer:   Administer Perflutren    Order Specific Question:   Reason for exam    Answer:   Other-Full Diagnosis List    Order Specific Question:   Full ICD-10/Reason for Exam    Answer:   Pericardial effusion [[id number]]    Order Specific Question:   Release to patient    Answer:   Immediate   No orders of the defined types were placed in this encounter.   Return in 2 months (on 03/28/2022), or if symptoms worsen or fail to improve.  I have spent a total time of 45-minutes on the day of the appointment including chart review, data review, collecting history, coordinating care and discussing medical diagnosis and plan with the patient/family. Past medical history, allergies, medications were reviewed. Pertinent imaging, labs and tests included in this note have been reviewed and interpreted independently by me.  [place], MD [facility] 01/25/2022 12:47 PM  Office Number [phone]

## 2022-01-25 NOTE — Telephone Encounter (Signed)
Staff message sent to Vella Kohler for insurance prior authorization for the Echocardiogram ordered by Dr. Loanne Drilling

## 2022-01-29 ENCOUNTER — Other Ambulatory Visit: Payer: Self-pay | Admitting: Internal Medicine

## 2022-01-30 ENCOUNTER — Ambulatory Visit (INDEPENDENT_AMBULATORY_CARE_PROVIDER_SITE_OTHER): Payer: Medicare Other

## 2022-01-30 DIAGNOSIS — I361 Nonrheumatic tricuspid (valve) insufficiency: Secondary | ICD-10-CM | POA: Diagnosis not present

## 2022-01-30 DIAGNOSIS — I340 Nonrheumatic mitral (valve) insufficiency: Secondary | ICD-10-CM | POA: Diagnosis not present

## 2022-01-30 DIAGNOSIS — I3139 Other pericardial effusion (noninflammatory): Secondary | ICD-10-CM | POA: Diagnosis not present

## 2022-01-30 DIAGNOSIS — J9611 Chronic respiratory failure with hypoxia: Secondary | ICD-10-CM

## 2022-01-30 LAB — ECHOCARDIOGRAM COMPLETE
Area-P 1/2: 4.15 cm2
S' Lateral: 1.79 cm

## 2022-01-30 NOTE — Progress Notes (Unsigned)
Due to his echo I asked patient was he feeling more short of breath than usual. Patient stated I am always short of breath. I replied but is it more than normal for you and he said no but then he took it back and said yes I am shorter of breath than I normally am. I informed patient and his daughter that I wanted to have the cardiologist look at his exam before he left. Which they were agreeable. After 30 minutes the patient said he another appointment and couldn't wait for the doctor. Patient said could he leave and the doctor call him if anything needs to be done. I messaged the cardiologist who was in a procedure. I also reached out to my manager who said try to have the doctor of the day here review the study. The doctor of the day was unavailable for 5 minutes. The patient and daughter left AMA and asked that they could to the emergency room if he feels worse or something changes. The daughter told me she knows what to do. I tried to have them sign an AMA form but was unable to locate one. Some of this was witnessed by Alvina Filbert, RN.

## 2022-01-31 ENCOUNTER — Telehealth (HOSPITAL_COMMUNITY): Payer: Self-pay

## 2022-01-31 NOTE — Telephone Encounter (Signed)
Received referral from Dr. Loanne Drilling for this pt to participate in Pulmonary Rehab with the diagnosis of Chronic Hypoxic Respiratory Failure. Clinical review of pt follow up appt on 01/25/22 Pulmonary office note. Pt appropriate for scheduling for Pulmonary rehab. Will forward to support staff for scheduling and verification of insurance eligibility/benefits with pt consent.   Janine Ores, RN, BSN Cardiac and Pulmonary Rehab

## 2022-02-01 ENCOUNTER — Telehealth (HOSPITAL_BASED_OUTPATIENT_CLINIC_OR_DEPARTMENT_OTHER): Payer: Self-pay | Admitting: Pulmonary Disease

## 2022-02-01 ENCOUNTER — Other Ambulatory Visit (HOSPITAL_COMMUNITY): Payer: Self-pay

## 2022-02-01 MED ORDER — POTASSIUM CHLORIDE CRYS ER 20 MEQ PO TBCR: 20.0000 meq | EXTENDED_RELEASE_TABLET | Freq: Every day | ORAL | 0 refills | Status: DC

## 2022-02-01 MED ORDER — TORSEMIDE 20 MG PO TABS: 40.0000 mg | ORAL_TABLET | Freq: Every day | ORAL | 0 refills | Status: AC

## 2022-02-01 NOTE — Telephone Encounter (Signed)
[facility] Telephone Encounter  Called patient's daughter to discuss echo results per patient request.   Echo 01/30/22 - Normal EF. Grade DD. Severely reduced RV function. Moderate circumferential pericardial effusion without diastolic collapse. Cor pulmonale present.  Daughter reports patient has had increased testicular swelling for the last month and seems to be worsening.  Assessment/Plan  Cor pulmonale/RV failure --Torsemide 20 mg daily PLUS KCL 20 mEQ daily --Will need BMET at follow-up visit --Daughter will discuss with her father if he wants to pursue right heart cath to confirm [facility]. May be a candidate for Tyvaso if group 1  --Currently enrolled in palliative

## 2022-02-05 ENCOUNTER — Other Ambulatory Visit (HOSPITAL_COMMUNITY): Payer: Self-pay

## 2022-02-05 ENCOUNTER — Ambulatory Visit: Payer: Medicare Other | Admitting: Adult Health

## 2022-02-05 ENCOUNTER — Other Ambulatory Visit: Payer: Self-pay | Admitting: Nurse Practitioner

## 2022-02-05 DIAGNOSIS — G893 Neoplasm related pain (acute) (chronic): Secondary | ICD-10-CM

## 2022-02-05 DIAGNOSIS — K5903 Drug induced constipation: Secondary | ICD-10-CM

## 2022-02-05 DIAGNOSIS — Z515 Encounter for palliative care: Secondary | ICD-10-CM

## 2022-02-05 DIAGNOSIS — C61 Malignant neoplasm of prostate: Secondary | ICD-10-CM

## 2022-02-05 MED ORDER — XTAMPZA ER 36 MG PO C12A
36.0000 mg | EXTENDED_RELEASE_CAPSULE | Freq: Three times a day (TID) | ORAL | 0 refills | Status: AC
  Filled 2022-02-05: qty 90, 30d supply, fill #0
  Filled 2022-02-06: qty 81, 27d supply, fill #0
  Filled 2022-02-06 (×2): qty 9, 3d supply, fill #0
  Filled 2022-02-07: qty 90, 30d supply, fill #0

## 2022-02-06 ENCOUNTER — Other Ambulatory Visit (HOSPITAL_COMMUNITY): Payer: Self-pay

## 2022-02-06 ENCOUNTER — Other Ambulatory Visit: Payer: Self-pay

## 2022-02-06 ENCOUNTER — Telehealth (HOSPITAL_COMMUNITY): Payer: Self-pay

## 2022-02-06 NOTE — Telephone Encounter (Signed)
Pt insurance is active and benefits verified through [facility] Medicare Co-pay 0, DED 0/0 met, out of pocket $8,300/$2,553.37 met, co-insurance 20%. no pre-authorization required, [place]/[employer] 02/06/22_0 :12am, REF# [id number]   Pt is changing insurance after the first of the year and will bring new insurance card at 1st appt.

## 2022-02-07 ENCOUNTER — Other Ambulatory Visit (HOSPITAL_COMMUNITY): Payer: Self-pay

## 2022-02-07 ENCOUNTER — Telehealth (HOSPITAL_COMMUNITY): Payer: Self-pay | Admitting: *Deleted

## 2022-02-07 NOTE — Telephone Encounter (Signed)
Called Dustin Alvarez to confirm his PR orientation appointment. I got his son on his phone number. He states that [employer] is changing on 02/11/22. He states that he feels that his Dad needs to wait to see if his new insurance will cover the program. His son states that he will call after the new year to find out about his coverage.

## 2022-02-08 ENCOUNTER — Other Ambulatory Visit: Payer: Self-pay | Admitting: Internal Medicine

## 2022-02-08 ENCOUNTER — Ambulatory Visit (HOSPITAL_COMMUNITY): Payer: Medicare Other

## 2022-02-08 DIAGNOSIS — J432 Centrilobular emphysema: Secondary | ICD-10-CM | POA: Diagnosis not present

## 2022-02-12 ENCOUNTER — Ambulatory Visit (HOSPITAL_COMMUNITY): Payer: Medicare Other

## 2022-02-13 ENCOUNTER — Other Ambulatory Visit: Payer: Self-pay | Admitting: Nurse Practitioner

## 2022-02-13 ENCOUNTER — Inpatient Hospital Stay: Payer: Medicare (Managed Care) | Admitting: Nurse Practitioner

## 2022-02-13 ENCOUNTER — Telehealth: Payer: Self-pay

## 2022-02-13 DIAGNOSIS — G893 Neoplasm related pain (acute) (chronic): Secondary | ICD-10-CM

## 2022-02-13 DIAGNOSIS — Z515 Encounter for palliative care: Secondary | ICD-10-CM

## 2022-02-13 DIAGNOSIS — K5903 Drug induced constipation: Secondary | ICD-10-CM

## 2022-02-13 DIAGNOSIS — C61 Malignant neoplasm of prostate: Secondary | ICD-10-CM

## 2022-02-13 MED ORDER — OXYCODONE HCL 5 MG PO TABS: 5.0000 mg | ORAL_TABLET | ORAL | 0 refills | Status: AC | PRN

## 2022-02-13 NOTE — Progress Notes (Signed)
[facility]  [phone] [phone]) [phone]   Name: Dustin Alvarez Date: 02/13/2022 MRN: [id number]  DOB: 1955-08-29  Patient Care Team: Farrel Gordon, DO as PCP - General (Internal Medicine) Cira Rue, RN Nurse Navigator as Registered Nurse (Medical Oncology) Pickenpack-Cousar, Carlena Sax, NP as Nurse Practitioner (Nurse Practitioner)   I connected with Dustin Alvarez on 02/13/22 at 11:00 AM EST by phone and verified that I am speaking with the correct person using two identifiers.   I discussed the limitations, risks, security and privacy concerns of performing an evaluation and management service by telemedicine and the availability of in-person appointments. I also discussed with the patient that there may be a patient responsible charge related to this service. The patient expressed understanding and agreed to proceed.   Other persons participating in the visit and their role in the encounter: Antoinett Dorman, RN   Patient's location: home   Provider's location: [facility] office   Chief Complaint: Follow up on pain and constipation secondary to metastatic prostate cancer.   REASON FOR CONSULTATION: Dustin Alvarez is a 67 y.o. 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  Palliative ask to see for symptom management and goals of care.    SOCIAL HISTORY:     reports that he has been smoking cigarettes. He has a 53.00 pack-year smoking history. He has never used smokeless tobacco. He reports current alcohol use. He reports that he does not currently use drugs.  ADVANCE DIRECTIVES:  Patient reports his children would be his medical decision makers. Does have MOST on file. Personally reviewed an discussed with no requested changes.    CODE STATUS: DNR  PAST MEDICAL  HISTORY: Past Medical History:  Diagnosis Date   Asthma    COPD (chronic obstructive pulmonary disease) (HCC)    GERD (gastroesophageal reflux disease)    Hypertension    Prostate cancer ([place])     PAST SURGICAL HISTORY:  Past Surgical History:  Procedure Laterality Date   ABDOMINAL AORTOGRAM W/LOWER EXTREMITY N/A 10/26/2020   Procedure: ABDOMINAL AORTOGRAM W/LOWER EXTREMITY;  Surgeon: Marty Heck, MD;  Location: [facility];  Service: Cardiovascular;  Laterality: N/A;   COLONOSCOPY WITH PROPOFOL N/A 05/22/2021   Procedure: COLONOSCOPY WITH PROPOFOL;  Surgeon: Doran Stabler, MD;  Location: [facility];  Service: Gastroenterology;  Laterality: N/A;   ESOPHAGOGASTRODUODENOSCOPY (EGD) WITH PROPOFOL N/A 05/22/2021   Procedure: ESOPHAGOGASTRODUODENOSCOPY (EGD) WITH PROPOFOL;  Surgeon: Doran Stabler, MD;  Location: [facility];  Service: Gastroenterology;  Laterality: N/A;   HEMOSTASIS CLIP PLACEMENT  05/22/2021   Procedure: HEMOSTASIS CLIP PLACEMENT;  Surgeon: Doran Stabler, MD;  Location: [facility];  Service: Gastroenterology;;   HOT HEMOSTASIS N/A 05/22/2021   Procedure: HOT HEMOSTASIS (ARGON PLASMA COAGULATION/BICAP);  Surgeon: Doran Stabler, MD;  Location: [facility];  Service: Gastroenterology;  Laterality: N/A;   WRIST SURGERY Left 2017    HEMATOLOGY/ONCOLOGY HISTORY:  Oncology History   No history exists.    ALLERGIES:  is allergic to ace inhibitors and lisinopril.  MEDICATIONS:  Current Outpatient Medications  Medication Sig Dispense Refill   gabapentin (NEURONTIN) 300 MG capsule TAKE TWO CAPSULES BY MOUTH TWICE DAILY 120 capsule 2   abiraterone acetate (ZYTIGA) 250 MG tablet TAKE 4 TABLETS BY MOUTH DAILY. TAKE ON AN EMPTY STOMACH 1 HOUR BEFORE OR  2 HOURS AFTER A MEAL 120 tablet 2   albuterol (PROVENTIL) (2.5 MG/3ML) 0.083% nebulizer solution Take 3 mLs (2.5 mg total) by nebulization every 6 (six) hours as needed for wheezing or shortness  of breath. 180 mL 5   albuterol (VENTOLIN HFA) 108 (90 Base) MCG/ACT inhaler INHALE TWO puffs into THE lungs EVERY SIX HOURS AS NEEDED wheezing OR For SHORTNESS OF BREATH 6.7 g 3   amLODipine (NORVASC) 5 MG tablet Take 1 tablet (5 mg total) by mouth daily. 90 tablet 1   calcium-vitamin D (OSCAL WITH D) 500-200 MG-UNIT tablet Take 1 tablet by mouth daily with breakfast. 90 tablet 3   calcium-vitamin D (OSCAL WITH D) 500-5 MG-MCG tablet Take 1 tablet by mouth daily with breakfast. 30 tablet 6   docusate sodium (COLACE) 100 MG capsule Take 200 mg by mouth daily.     ferrous sulfate 325 (65 FE) MG tablet Take 1 tablet (325 mg total) by mouth daily with breakfast. Please take with a source of Vitamin C 90 tablet 3   finasteride (PROSCAR) 5 MG tablet Take 1 tablet (5 mg total) by mouth daily. 90 tablet 3   Fluticasone-Umeclidin-Vilant (TRELEGY ELLIPTA) 200-62.5-25 MCG/ACT AEPB Inhale 1 puff into the lungs daily. Use once daily 60 each 11   lactulose (CHRONULAC) 10 GM/15ML solution Take 30 mLs (20 g total) by mouth 2 (two) times daily as needed for mild constipation. 236 mL 2   lidocaine (LIDODERM) 5 % place ONE PATCH onto THE SKIN daily. REMOVE AND discard WITHIN 12 HOURS OR AS DIRECTED 30 patch 0   nicotine (NICOTROL) 10 MG inhaler Inhale 1 continuous puffing into the lungs as needed for smoking cessation.     oxyCODONE (OXY IR/ROXICODONE) 5 MG immediate release tablet Take 1-2 tablets (5-10 mg total) by mouth every 4 (four) hours as needed for moderate to severe pain, or breakthrough pain. 180 tablet 0   oxyCODONE ER (XTAMPZA ER) 36 MG C12A Take 1 capsule (36 mg total) by mouth every 8 (eight) hours. 90 capsule 0   pantoprazole (PROTONIX) 40 MG tablet Take 1 tablet (40 mg total) by mouth daily. 90 tablet 3   polyethylene glycol (MIRALAX) 17 g packet Mix 17 grams (1 packet) with 4-8 ounces of liquid and take by mouth 2 (two) times daily. 60 each 2   potassium chloride SA (KLOR-CON M) 20 MEQ tablet Take 1  tablet (20 mEq total) by mouth daily for 5 days. 5 tablet 0   predniSONE (DELTASONE) 5 MG tablet y1y BY MOUTH EVERY DAY with breakfast .**Do not take prior TO taking Abiraterone** 90 tablet 1   senna-docusate (SENNA S) 8.6-50 MG tablet Take 2 tablets by mouth daily. 60 tablet 2   sildenafil (VIAGRA) 100 MG tablet Take 1 tablet (100 mg total) by mouth daily as needed for erectile dysfunction. 30 tablet 0   silver sulfADIAZINE (SILVADENE) 1 % cream Apply to left great toe once daily. (Patient not taking: Reported on 01/25/2022) 50 g 1   sorbitol 70 % SOLN Take 15 mLs by mouth daily as needed. 473 mL 0   tamsulosin (FLOMAX) 0.4 MG CAPS capsule Take 1 capsule (0.4 mg total) by mouth in the morning and at bedtime. 180 capsule 0   tiZANidine (ZANAFLEX) 2 MG tablet Take 1 tablet (2 mg total) by mouth 2 (two) times daily as needed for muscle spasms. 45 tablet 1   torsemide (DEMADEX) 20 MG tablet Take 2 tablets (40 mg total) by mouth daily. 5  tablet 0   traZODone (DESYREL) 100 MG tablet Take 0.5 tablets (50 mg total) by mouth at bedtime. 90 tablet 1   No current facility-administered medications for this visit.    VITAL SIGNS: There were no vitals taken for this visit. There were no vitals filed for this visit.   Estimated body mass index is 22.84 kg/m as calculated from the following:   Height as of 01/25/22: '5\' 10"'$  (1.778 m).   Weight as of 01/25/22: 159 lb 2.8 oz (72.2 kg).   PERFORMANCE STATUS (ECOG) : 1 - Symptomatic but completely ambulatory  IMPRESSION:   1.  Neoplasm related pain .  2.  Constipation    PLAN:  Tizanidine '2mg'$  every 12 hours as needed for muscle spasms Xtampza 36 mg to every 8 hours. Tolerating well. Pain controlled.  5-10 mg Oxy IR every 4 hours as needed for breakthrough pain as prescribed.  Gabapentin 600 mg twice daily.   Lidocaine patch to lower back Lactulose as needed for constipation Senna-S twice daily  Ongoing goals of care discussion.  I will plan  to see patient back in 3-4 weeks in collaboration to other oncology appointments.    Patient expressed understanding and was in agreement with this plan. He also understands that He can call the clinic at any time with any questions, concerns, or complaints.    Any controlled substances utilized were prescribed in the context of palliative care. PDMP has been reviewed.    Time Total: 35 min  Visit consisted of counseling and education dealing with the complex and emotionally intense issues of symptom management and palliative care in the setting of serious and potentially life-threatening illness.Greater than 50%  of this time was spent counseling and coordinating care related to the above assessment and plan.  Alda Lea, AGPCNP-BC  Palliative Medicine Team/[place]

## 2022-02-13 NOTE — Telephone Encounter (Signed)
Called and spoke with pt daughter, [name], pt wishes to cancel today's appt d/t insurance issues, otherwise family reports they are dong well and no concerns at this time.

## 2022-02-14 ENCOUNTER — Ambulatory Visit (HOSPITAL_COMMUNITY): Payer: Medicare Other

## 2022-02-15 ENCOUNTER — Telehealth (HOSPITAL_BASED_OUTPATIENT_CLINIC_OR_DEPARTMENT_OTHER): Payer: Self-pay | Admitting: Pulmonary Disease

## 2022-02-15 NOTE — Telephone Encounter (Signed)
Contacted patient regarding response to diuretics. His daughter reports good response however has had to reschedule appointments and labs until February due change in insurance. I encouraged patient to at least obtain labs to check kidneys with diuresis response however states patient cannot afford an visits until then.   Will cancel our office visit on 02/26/22 per patient request.

## 2022-02-18 ENCOUNTER — Other Ambulatory Visit: Payer: Self-pay | Admitting: Internal Medicine

## 2022-02-18 ENCOUNTER — Other Ambulatory Visit: Payer: Self-pay | Admitting: Hematology and Oncology

## 2022-02-18 ENCOUNTER — Emergency Department (HOSPITAL_COMMUNITY): Payer: Medicare (Managed Care)

## 2022-02-18 ENCOUNTER — Encounter: Payer: Self-pay | Admitting: Hematology and Oncology

## 2022-02-18 ENCOUNTER — Observation Stay (HOSPITAL_COMMUNITY)
Admission: EM | Admit: 2022-02-18 | Discharge: 2022-02-19 | Disposition: A | Discharge status: Home / Self Care | Payer: Medicare (Managed Care) | Attending: Internal Medicine | Admitting: Internal Medicine

## 2022-02-18 ENCOUNTER — Other Ambulatory Visit: Payer: Self-pay

## 2022-02-18 ENCOUNTER — Other Ambulatory Visit: Payer: Self-pay | Admitting: Nurse Practitioner

## 2022-02-18 ENCOUNTER — Other Ambulatory Visit (HOSPITAL_COMMUNITY): Payer: Self-pay

## 2022-02-18 ENCOUNTER — Encounter (HOSPITAL_COMMUNITY): Payer: Self-pay | Admitting: Emergency Medicine

## 2022-02-18 DIAGNOSIS — J441 Chronic obstructive pulmonary disease with (acute) exacerbation: Secondary | ICD-10-CM | POA: Insufficient documentation

## 2022-02-18 DIAGNOSIS — Z1152 Encounter for screening for COVID-19: Secondary | ICD-10-CM | POA: Diagnosis not present

## 2022-02-18 DIAGNOSIS — G9340 Encephalopathy, unspecified: Secondary | ICD-10-CM | POA: Diagnosis not present

## 2022-02-18 DIAGNOSIS — D500 Iron deficiency anemia secondary to blood loss (chronic): Secondary | ICD-10-CM | POA: Diagnosis present

## 2022-02-18 DIAGNOSIS — R0603 Acute respiratory distress: Secondary | ICD-10-CM

## 2022-02-18 DIAGNOSIS — J9620 Acute and chronic respiratory failure, unspecified whether with hypoxia or hypercapnia: Secondary | ICD-10-CM | POA: Insufficient documentation

## 2022-02-18 DIAGNOSIS — F17210 Nicotine dependence, cigarettes, uncomplicated: Secondary | ICD-10-CM | POA: Diagnosis not present

## 2022-02-18 DIAGNOSIS — N179 Acute kidney failure, unspecified: Secondary | ICD-10-CM | POA: Insufficient documentation

## 2022-02-18 DIAGNOSIS — J189 Pneumonia, unspecified organism: Secondary | ICD-10-CM | POA: Diagnosis not present

## 2022-02-18 DIAGNOSIS — C61 Malignant neoplasm of prostate: Secondary | ICD-10-CM | POA: Diagnosis present

## 2022-02-18 DIAGNOSIS — D62 Acute posthemorrhagic anemia: Secondary | ICD-10-CM | POA: Diagnosis not present

## 2022-02-18 DIAGNOSIS — Z515 Encounter for palliative care: Secondary | ICD-10-CM

## 2022-02-18 DIAGNOSIS — K5903 Drug induced constipation: Secondary | ICD-10-CM

## 2022-02-18 DIAGNOSIS — Z79899 Other long term (current) drug therapy: Secondary | ICD-10-CM | POA: Diagnosis not present

## 2022-02-18 DIAGNOSIS — I3139 Other pericardial effusion (noninflammatory): Secondary | ICD-10-CM | POA: Insufficient documentation

## 2022-02-18 DIAGNOSIS — I10 Essential (primary) hypertension: Secondary | ICD-10-CM | POA: Diagnosis present

## 2022-02-18 DIAGNOSIS — K219 Gastro-esophageal reflux disease without esophagitis: Secondary | ICD-10-CM

## 2022-02-18 DIAGNOSIS — C7951 Secondary malignant neoplasm of bone: Secondary | ICD-10-CM | POA: Diagnosis present

## 2022-02-18 DIAGNOSIS — R590 Localized enlarged lymph nodes: Secondary | ICD-10-CM | POA: Insufficient documentation

## 2022-02-18 DIAGNOSIS — G893 Neoplasm related pain (acute) (chronic): Secondary | ICD-10-CM

## 2022-02-18 DIAGNOSIS — R0602 Shortness of breath: Secondary | ICD-10-CM | POA: Diagnosis present

## 2022-02-18 DIAGNOSIS — J690 Pneumonitis due to inhalation of food and vomit: Secondary | ICD-10-CM | POA: Insufficient documentation

## 2022-02-18 LAB — I-STAT VENOUS BLOOD GAS, ED
Acid-Base Excess: 4 mmol/L — ABNORMAL HIGH (ref 0.0–2.0)
Acid-Base Excess: 1 mmol/L (ref 0.0–2.0)
Bicarbonate: 31.6 mmol/L — ABNORMAL HIGH (ref 20.0–28.0)
Bicarbonate: 28.7 mmol/L — ABNORMAL HIGH (ref 20.0–28.0)
Calcium, Ion: 1.05 mmol/L — ABNORMAL LOW (ref 1.15–1.40)
Calcium, Ion: 1.02 mmol/L — ABNORMAL LOW (ref 1.15–1.40)
HCT: 38 % — ABNORMAL LOW (ref 39.0–52.0)
HCT: 34 % — ABNORMAL LOW (ref 39.0–52.0)
Hemoglobin: 12.9 g/dL — ABNORMAL LOW (ref 13.0–17.0)
Hemoglobin: 11.6 g/dL — ABNORMAL LOW (ref 13.0–17.0)
O2 Saturation: 53 %
O2 Saturation: 98 %
Potassium: 4.2 mmol/L (ref 3.5–5.1)
Potassium: 3.8 mmol/L (ref 3.5–5.1)
Sodium: 139 mmol/L (ref 135–145)
Sodium: 138 mmol/L (ref 135–145)
TCO2: 33 mmol/L — ABNORMAL HIGH (ref 22–32)
TCO2: 30 mmol/L (ref 22–32)
pCO2, Ven: 60.5 mmHg — ABNORMAL HIGH (ref 44–60)
pCO2, Ven: 59.4 mmHg (ref 44–60)
pH, Ven: 7.326 (ref 7.25–7.43)
pH, Ven: 7.292 (ref 7.25–7.43)
pO2, Ven: 31 mmHg — CL (ref 32–45)
pO2, Ven: 118 mmHg — ABNORMAL HIGH (ref 32–45)

## 2022-02-18 LAB — CBC WITH DIFFERENTIAL/PLATELET
Abs Immature Granulocytes: 0.02 10*3/uL (ref 0.00–0.07)
Basophils Absolute: 0 10*3/uL (ref 0.0–0.1)
Basophils Relative: 0 %
Eosinophils Absolute: 0 10*3/uL (ref 0.0–0.5)
Eosinophils Relative: 0 %
HCT: 33.5 % — ABNORMAL LOW (ref 39.0–52.0)
Hemoglobin: 10.6 g/dL — ABNORMAL LOW (ref 13.0–17.0)
Immature Granulocytes: 0 %
Lymphocytes Relative: 14 %
Lymphs Abs: 0.8 10*3/uL (ref 0.7–4.0)
MCH: 24 pg — ABNORMAL LOW (ref 26.0–34.0)
MCHC: 31.6 g/dL (ref 30.0–36.0)
MCV: 76 fL — ABNORMAL LOW (ref 80.0–100.0)
Monocytes Absolute: 0.6 10*3/uL (ref 0.1–1.0)
Monocytes Relative: 11 %
Neutro Abs: 3.9 10*3/uL (ref 1.7–7.7)
Neutrophils Relative %: 75 %
Platelets: 219 10*3/uL (ref 150–400)
RBC: 4.41 MIL/uL (ref 4.22–5.81)
RDW: 18.6 % — ABNORMAL HIGH (ref 11.5–15.5)
WBC: 5.3 10*3/uL (ref 4.0–10.5)
nRBC: 0 % (ref 0.0–0.2)

## 2022-02-18 LAB — COMPREHENSIVE METABOLIC PANEL
ALT: 11 U/L (ref 0–44)
AST: 17 U/L (ref 15–41)
Albumin: 3.6 g/dL (ref 3.5–5.0)
Alkaline Phosphatase: 84 U/L (ref 38–126)
Anion gap: 10 (ref 5–15)
BUN: 21 mg/dL (ref 8–23)
CO2: 30 mmol/L (ref 22–32)
Calcium: 8.2 mg/dL — ABNORMAL LOW (ref 8.9–10.3)
Chloride: 97 mmol/L — ABNORMAL LOW (ref 98–111)
Creatinine, Ser: 1.34 mg/dL — ABNORMAL HIGH (ref 0.61–1.24)
GFR, Estimated: 58 mL/min — ABNORMAL LOW (ref >60)
Glucose, Bld: 126 mg/dL — ABNORMAL HIGH (ref 70–99)
Potassium: 4.1 mmol/L (ref 3.5–5.1)
Sodium: 137 mmol/L (ref 135–145)
Total Bilirubin: 0.7 mg/dL (ref 0.3–1.2)
Total Protein: 7.8 g/dL (ref 6.5–8.1)

## 2022-02-18 LAB — I-STAT CHEM 8, ED
BUN: 24 mg/dL — ABNORMAL HIGH (ref 8–23)
Calcium, Ion: 1.04 mmol/L — ABNORMAL LOW (ref 1.15–1.40)
Chloride: 96 mmol/L — ABNORMAL LOW (ref 98–111)
Creatinine, Ser: 1.4 mg/dL — ABNORMAL HIGH (ref 0.61–1.24)
Glucose, Bld: 128 mg/dL — ABNORMAL HIGH (ref 70–99)
HCT: 38 % — ABNORMAL LOW (ref 39.0–52.0)
Hemoglobin: 12.9 g/dL — ABNORMAL LOW (ref 13.0–17.0)
Potassium: 4.2 mmol/L (ref 3.5–5.1)
Sodium: 139 mmol/L (ref 135–145)
TCO2: 30 mmol/L (ref 22–32)

## 2022-02-18 LAB — RESP PANEL BY RT-PCR (RSV, FLU A&B, COVID)  RVPGX2
Influenza A by PCR: NEGATIVE
Influenza B by PCR: NEGATIVE
Resp Syncytial Virus by PCR: NEGATIVE
SARS Coronavirus 2 by RT PCR: NEGATIVE

## 2022-02-18 LAB — TROPONIN I (HIGH SENSITIVITY)
Troponin I (High Sensitivity): 16 ng/L (ref <18)
Troponin I (High Sensitivity): 18 ng/L — ABNORMAL HIGH (ref <18)

## 2022-02-18 LAB — LACTIC ACID, PLASMA: Lactic Acid, Venous: 1.4 mmol/L (ref 0.5–1.9)

## 2022-02-18 LAB — BRAIN NATRIURETIC PEPTIDE: B Natriuretic Peptide: 1537.2 pg/mL — ABNORMAL HIGH (ref 0.0–100.0)

## 2022-02-18 MED ORDER — NICOTINE 7 MG/24HR TD PT24
7.0000 mg | MEDICATED_PATCH | Freq: Every day | TRANSDERMAL | Status: DC
  Administered 2022-02-19: 7 mg via TRANSDERMAL
  Filled 2022-02-18 (×2): qty 1

## 2022-02-18 MED ORDER — SODIUM CHLORIDE 0.9 % IV SOLN
3.0000 g | Freq: Four times a day (QID) | INTRAVENOUS | Status: DC
  Administered 2022-02-18 – 2022-02-19 (×4): 3 g via INTRAVENOUS
  Filled 2022-02-18 (×4): qty 8

## 2022-02-18 MED ORDER — FINASTERIDE 5 MG PO TABS
5.0000 mg | ORAL_TABLET | Freq: Every day | ORAL | Status: DC
  Administered 2022-02-19: 5 mg via ORAL
  Filled 2022-02-18: qty 1

## 2022-02-18 MED ORDER — ACETAMINOPHEN 650 MG RE SUPP: 650.0000 mg | Freq: Four times a day (QID) | RECTAL | Status: DC | PRN

## 2022-02-18 MED ORDER — TAMSULOSIN HCL 0.4 MG PO CAPS
0.4000 mg | ORAL_CAPSULE | Freq: Every day | ORAL | Status: DC
  Administered 2022-02-19: 0.4 mg via ORAL
  Filled 2022-02-18: qty 1

## 2022-02-18 MED ORDER — MAGNESIUM SULFATE 2 GM/50ML IV SOLN
2.0000 g | Freq: Once | INTRAVENOUS | Status: AC
  Administered 2022-02-18: 2 g via INTRAVENOUS
  Filled 2022-02-18: qty 50

## 2022-02-18 MED ORDER — PREDNISONE 20 MG PO TABS: 40.0000 mg | ORAL_TABLET | Freq: Every day | ORAL | Status: DC

## 2022-02-18 MED ORDER — FERROUS SULFATE 325 (65 FE) MG PO TABS
325.0000 mg | ORAL_TABLET | Freq: Every day | ORAL | Status: DC
  Administered 2022-02-19: 325 mg via ORAL
  Filled 2022-02-18: qty 1

## 2022-02-18 MED ORDER — IPRATROPIUM BROMIDE 0.02 % IN SOLN
RESPIRATORY_TRACT | Status: AC
  Administered 2022-02-18: 1 mg
  Filled 2022-02-18: qty 5

## 2022-02-18 MED ORDER — LACTATED RINGERS IV BOLUS (SEPSIS)
1000.0000 mL | Freq: Once | INTRAVENOUS | Status: AC
  Administered 2022-02-18: 1000 mL via INTRAVENOUS

## 2022-02-18 MED ORDER — IPRATROPIUM-ALBUTEROL 0.5-2.5 (3) MG/3ML IN SOLN: 3.0000 mL | RESPIRATORY_TRACT | Status: DC | PRN

## 2022-02-18 MED ORDER — LACTATED RINGERS IV BOLUS
1000.0000 mL | Freq: Once | INTRAVENOUS | Status: AC
  Administered 2022-02-18: 1000 mL via INTRAVENOUS

## 2022-02-18 MED ORDER — SODIUM CHLORIDE 0.9 % IV SOLN
500.0000 mg | Freq: Once | INTRAVENOUS | Status: AC
  Administered 2022-02-18: 500 mg via INTRAVENOUS
  Filled 2022-02-18: qty 5

## 2022-02-18 MED ORDER — ENOXAPARIN SODIUM 40 MG/0.4ML IJ SOSY
40.0000 mg | PREFILLED_SYRINGE | INTRAMUSCULAR | Status: DC
  Administered 2022-02-19: 40 mg via SUBCUTANEOUS
  Filled 2022-02-18: qty 0.4

## 2022-02-18 MED ORDER — FLUTICASONE FUROATE-VILANTEROL 200-25 MCG/ACT IN AEPB
1.0000 | INHALATION_SPRAY | Freq: Every day | RESPIRATORY_TRACT | Status: DC
  Administered 2022-02-19: 1 via RESPIRATORY_TRACT
  Filled 2022-02-18: qty 28

## 2022-02-18 MED ORDER — ACETAMINOPHEN 325 MG PO TABS: 650.0000 mg | ORAL_TABLET | Freq: Four times a day (QID) | ORAL | Status: DC | PRN

## 2022-02-18 MED ORDER — IPRATROPIUM-ALBUTEROL 0.5-2.5 (3) MG/3ML IN SOLN
3.0000 mL | RESPIRATORY_TRACT | Status: DC
  Administered 2022-02-19 (×4): 3 mL via RESPIRATORY_TRACT
  Filled 2022-02-18 (×4): qty 3

## 2022-02-18 MED ORDER — ALBUTEROL SULFATE (2.5 MG/3ML) 0.083% IN NEBU: 12.0000 mL | INHALATION_SOLUTION | Freq: Once | RESPIRATORY_TRACT | Status: DC

## 2022-02-18 MED ORDER — ABIRATERONE ACETATE 250 MG PO TABS: 1000.0000 mg | ORAL_TABLET | Freq: Every day | ORAL | Status: DC

## 2022-02-18 MED ORDER — SODIUM CHLORIDE 0.9 % IV SOLN
1.0000 g | Freq: Once | INTRAVENOUS | Status: AC
  Administered 2022-02-18: 1 g via INTRAVENOUS
  Filled 2022-02-18: qty 10

## 2022-02-18 MED ORDER — ALBUTEROL SULFATE (2.5 MG/3ML) 0.083% IN NEBU
INHALATION_SOLUTION | RESPIRATORY_TRACT | Status: AC
  Administered 2022-02-18: 10 mg
  Filled 2022-02-18: qty 12

## 2022-02-18 MED ORDER — PREDNISONE 20 MG PO TABS
40.0000 mg | ORAL_TABLET | Freq: Every day | ORAL | Status: DC
  Administered 2022-02-19: 40 mg via ORAL
  Filled 2022-02-18: qty 2

## 2022-02-18 MED ORDER — IOHEXOL 350 MG/ML SOLN
75.0000 mL | Freq: Once | INTRAVENOUS | Status: AC | PRN
  Administered 2022-02-18: 75 mL via INTRAVENOUS

## 2022-02-18 NOTE — Hospital Course (Signed)
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  Most of the history was obtained from patient's daughter.  She states that patient stayed at his girlfriend's place since Friday and did not have his oxygen with him.  Patient's daughter lives nearby and checked on him this morning.  She noticed that patient was more confused with more labored breathing and wheezing.  When asked, patient told his daughter that he was doing fine.  Later, his girlfriend notified daughter that he was more confused and short of breath.  When daughter checked on him, patient was somnolent, fatigue which she described as "intoxicated".  Patient was in respiratory distress with garbled speech and tremors.  There was no loss of consciousness, urinary or bowel incontinence or seizure activity.  That was when she called EMS.  Daughter also noticed that patient may have taken all his Monday and Tuesday morning medications which include his pain medication.  Patient was on BiPAP initially during interview but was successfully transition to 3 L nasal cannula.  Patient was unsure of what happened today.  He denies fever, chills, coughing, worsening sputum production.  He endorses wheezing which he used his albuterol inhaler 3 times a day.  Reports adherence to his Trelegy and other oral medications.  He further denies abdominal pain, nausea, vomiting, diarrhea, constipation.  ED course: Unsure what his oxygen saturation in the EMS.  They could not get a good waveform due to his cold hands.  Patient was given breathing treatment and will put on BiPAP on arrival in the ED.  Blood pressure was normotensive.  Tmax 101.6.  Currently satting 99% on 3 L.  He has no leukocytosis.  Hemoglobin at baseline.  CMP showed an AKI with creatinine 1.34.  VBG  showed mildly elevated CO2.  CTA was negative for PE but showed possible left greater than right infiltrates concerning for infection.  His CT head was negative.  Patient was admitted for acute encephalopathy and acute on chronic hypoxic respiratory failure.

## 2022-02-18 NOTE — ED Provider Notes (Signed)
[facility] EMERGENCY DEPARTMENT Provider Note   CSN: [id number] Arrival date & time: 02/18/22  1520     History  Chief Complaint  Patient presents with   Altered Mental Status   Respiratory Distress    Dustin Alvarez is a 67 y.o. male.  Past medical history of prostate cancer with metastasis to the bone, hypertension, GERD, intention tremor, COPD, peripheral arterial disease, iron deficiency anemia.  Presenting to the emergency department today for altered mental status and respiratory distress.  Brought in by EMS with intermittent level of consciousness today, patient's son reports that he is having episodes of confusion and drowsiness which are waxing and waning.  Has a history of COPD for which he uses 3 L of nasal cannula oxygen at baseline, this weekend was at his girlfriend's house and was not on any oxygen over the course of the weekend.  On EMS arrival, patient was initially alert and oriented, helping get to the ambulance, but very confused.  Once he started receiving oxygen in the back of the ambulance, mental status cleared up a little bit but then started waxing and waning.  Family reports that he is having tremors, daughter notes that this something he has had before but never to this level.  EMS gave him 2 DuoNebs and 125 mg Solu-Medrol prior to arrival.  They have difficulty obtaining good oxygen saturation due to cold hands as he was initially found in the car.  Daughter does note that he likely accidentally took all of today's and tomorrow's medications, she found his weekly pillbox with empty Monday and Tuesday.  Do not believe this was intentional, daughter believes this was likely out of confusion.  Patient denies any chest pain, abdominal pain.  Unable to give full history due to his confusion and respiratory distress.   Altered Mental Status      Home Medications Prior to Admission medications   Medication Sig Start Date End Date Taking?  Authorizing Provider  gabapentin (NEURONTIN) 300 MG capsule TAKE TWO CAPSULES BY MOUTH TWICE DAILY 01/30/22   Riesa Pope, MD  abiraterone acetate (ZYTIGA) 250 MG tablet TAKE 4 TABLETS BY MOUTH DAILY. TAKE ON AN EMPTY STOMACH 1 HOUR BEFORE OR 2 HOURS AFTER A MEAL 01/05/22 01/05/23  [name], MD  albuterol (PROVENTIL) (2.5 MG/3ML) 0.083% nebulizer solution Take 3 mLs (2.5 mg total) by nebulization every 6 (six) hours as needed for wheezing or shortness of breath. 10/17/21   [name], MD  albuterol (VENTOLIN HFA) 108 (90 Base) MCG/ACT inhaler INHALE TWO puffs into THE lungs EVERY SIX HOURS AS NEEDED wheezing OR For SHORTNESS OF BREATH 08/22/21   Masters, Joellen Jersey, DO  amLODipine (NORVASC) 5 MG tablet Take 1 tablet (5 mg total) by mouth daily. 12/31/21   Farrel Gordon, DO  calcium-vitamin D (OSCAL WITH D) 500-200 MG-UNIT tablet Take 1 tablet by mouth daily with breakfast. 01/07/20   Harvie Heck, MD  calcium-vitamin D (OSCAL WITH D) 500-5 MG-MCG tablet Take 1 tablet by mouth daily with breakfast. 01/16/22   [name], MD  docusate sodium (COLACE) 100 MG capsule Take 200 mg by mouth daily.    [provider]  ferrous sulfate 325 (65 FE) MG tablet Take 1 tablet (325 mg total) by mouth daily with breakfast. Please take with a source of Vitamin C 01/16/22   [name], MD  finasteride (PROSCAR) 5 MG tablet Take 1 tablet (5 mg total) by mouth daily.  09/11/21 09/11/22  Lacinda Axon, MD  Fluticasone-Umeclidin-Vilant (TRELEGY ELLIPTA) 200-62.5-25 MCG/ACT AEPB Inhale 1 puff into the lungs daily. Use once daily 06/12/21   Margaretha Seeds, MD  lactulose (CHRONULAC) 10 GM/15ML solution Take 30 mLs (20 g total) by mouth 2 (two) times daily as needed for mild constipation. 12/26/21   Pickenpack-Cousar, Carlena Sax, NP  lidocaine (LIDODERM) 5 % place ONE PATCH onto THE SKIN daily. REMOVE AND discard WITHIN 12 HOURS OR AS DIRECTED 02/12/22   Aldine Contes, MD  nicotine  (NICOTROL) 10 MG inhaler Inhale 1 continuous puffing into the lungs as needed for smoking cessation.    [provider]  oxyCODONE (OXY IR/ROXICODONE) 5 MG immediate release tablet Take 1-2 tablets (5-10 mg total) by mouth every 4 (four) hours as needed for moderate to severe pain, or breakthrough pain. 02/13/22   Pickenpack-Cousar, Carlena Sax, NP  oxyCODONE ER (XTAMPZA ER) 36 MG C12A Take 1 capsule (36 mg total) by mouth every 8 (eight) hours. 02/05/22   Pickenpack-Cousar, Carlena Sax, NP  pantoprazole (PROTONIX) 40 MG tablet Take 1 tablet (40 mg total) by mouth daily. 04/24/21   [name], MD  polyethylene glycol (MIRALAX) 17 g packet Mix 17 grams (1 packet) with 4-8 ounces of liquid and take by mouth 2 (two) times daily. 05/17/21   Pickenpack-Cousar, Carlena Sax, NP  potassium chloride SA (KLOR-CON M) 20 MEQ tablet Take 1 tablet (20 mEq total) by mouth daily for 5 days. 02/01/22 02/06/22  Margaretha Seeds, MD  predniSONE (DELTASONE) 5 MG tablet TAKE ONE TABLET BY MOUTH EVERY DAY with breakfast .**Do not take prior TO taking Abiraterone**] 02/18/22   [name], MD  senna-docusate (SENNA S) 8.6-50 MG tablet Take 2 tablets by mouth daily. 08/30/21   Pickenpack-Cousar, Carlena Sax, NP  sildenafil (VIAGRA) 100 MG tablet Take 1 tablet (100 mg total) by mouth daily as needed for erectile dysfunction. 10/04/21   Farrel Gordon, DO  silver sulfADIAZINE (SILVADENE) 1 % cream Apply to left great toe once daily. Patient not taking: Reported on 01/25/2022 01/01/22   Marzetta Board, DPM  sorbitol 70 % SOLN Take 15 mLs by mouth daily as needed. 06/25/21   Pickenpack-Cousar, Carlena Sax, NP  tamsulosin (FLOMAX) 0.4 MG CAPS capsule Take 1 capsule (0.4 mg total) by mouth in the morning and at bedtime. 10/04/21   Farrel Gordon, DO  tiZANidine (ZANAFLEX) 2 MG tablet Take 1 tablet (2 mg total) by mouth 2 (two) times daily as needed for muscle spasms. 12/26/21   Pickenpack-Cousar, Carlena Sax, NP  torsemide (DEMADEX) 20 MG  tablet Take 2 tablets (40 mg total) by mouth daily. 02/01/22   Margaretha Seeds, MD  traZODone (DESYREL) 100 MG tablet Take 0.5 tablets (50 mg total) by mouth at bedtime. 10/04/21   Farrel Gordon, DO      Allergies    Ace inhibitors and Lisinopril    Review of Systems   Review of Systems  Physical Exam Updated Vital Signs BP 127/75   Pulse 91   Temp 98.5 F (36.9 C) (Axillary)   Resp 16   Ht '5\' 10"'$  (1.778 m)   Wt 72 kg   SpO2 94%   BMI 22.78 kg/m  Physical Exam Vitals and nursing note reviewed.  Constitutional:      General: He is in acute distress.     Appearance: He is well-developed. He is not ill-appearing or diaphoretic.  HENT:     Head: Normocephalic and atraumatic.  Right Ear: External ear normal.     Left Ear: External ear normal.     Nose: Nose normal.  Eyes:     Conjunctiva/sclera: Conjunctivae normal.  Cardiovascular:     Rate and Rhythm: Regular rhythm. Tachycardia present.     Pulses: Normal pulses.     Heart sounds: Normal heart sounds. No murmur heard. Pulmonary:     Effort: Tachypnea, accessory muscle usage and respiratory distress present.     Breath sounds: Decreased air movement (diffusely) present. Wheezing (intermittent, lower > upper) present. No rhonchi or rales.  Abdominal:     General: Abdomen is flat.     Palpations: Abdomen is soft.     Tenderness: There is no abdominal tenderness. There is no guarding or rebound.  Musculoskeletal:        General: No swelling.     Cervical back: Neck supple.     Right lower leg: No edema.     Left lower leg: No edema.  Skin:    General: Skin is warm and dry.     Capillary Refill: Capillary refill takes less than 2 seconds.     Findings: No erythema or rash.  Neurological:     Mental Status: He is alert. He is confused.     GCS: GCS eye subscore is 4. GCS verbal subscore is 4. GCS motor subscore is 6.     Cranial Nerves: Cranial nerves 2-12 are intact.     Sensory: Sensation is intact. No sensory  deficit.     Motor: Tremor present. No weakness or abnormal muscle tone.  Psychiatric:        Mood and Affect: Mood normal.     ED Results / Procedures / Treatments   Labs (all labs ordered are listed, but only abnormal results are displayed) Labs Reviewed  COMPREHENSIVE METABOLIC PANEL - Abnormal; Notable for the following components:      Result Value   Chloride 97 (*)    Glucose, Bld 126 (*)    Creatinine, Ser 1.34 (*)    Calcium 8.2 (*)    GFR, Estimated 58 (*)    All other components within normal limits  BRAIN NATRIURETIC PEPTIDE - Abnormal; Notable for the following components:   B Natriuretic Peptide 1,537.2 (*)    All other components within normal limits  CBC WITH DIFFERENTIAL/PLATELET - Abnormal; Notable for the following components:   Hemoglobin 10.6 (*)    HCT 33.5 (*)    MCV 76.0 (*)    MCH 24.0 (*)    RDW 18.6 (*)    All other components within normal limits  I-STAT CHEM 8, ED - Abnormal; Notable for the following components:   Chloride 96 (*)    BUN 24 (*)    Creatinine, Ser 1.40 (*)    Glucose, Bld 128 (*)    Calcium, Ion 1.04 (*)    Hemoglobin 12.9 (*)    HCT 38.0 (*)    All other components within normal limits  I-STAT VENOUS BLOOD GAS, ED - Abnormal; Notable for the following components:   pCO2, Ven 60.5 (*)    pO2, Ven 31 (*)    Bicarbonate 31.6 (*)    TCO2 33 (*)    Acid-Base Excess 4.0 (*)    Calcium, Ion 1.05 (*)    HCT 38.0 (*)    Hemoglobin 12.9 (*)    All other components within normal limits  I-STAT VENOUS BLOOD GAS, ED - Abnormal; Notable for the following components:  pO2, Ven 118 (*)    Bicarbonate 28.7 (*)    Calcium, Ion 1.02 (*)    HCT 34.0 (*)    Hemoglobin 11.6 (*)    All other components within normal limits  TROPONIN I (HIGH SENSITIVITY) - Abnormal; Notable for the following components:   Troponin I (High Sensitivity) 18 (*)    All other components within normal limits  RESP PANEL BY RT-PCR (RSV, FLU A&B, COVID)  [id number]   CULTURE, BLOOD (ROUTINE X 2)  CULTURE, BLOOD (ROUTINE X 2)  EXPECTORATED SPUTUM ASSESSMENT W GRAM STAIN, RFLX TO RESP C  MRSA NEXT GEN BY PCR, NASAL  LACTIC ACID, PLASMA  URINALYSIS, ROUTINE W REFLEX MICROSCOPIC  HIV ANTIBODY (ROUTINE TESTING W REFLEX)  CBC  BASIC METABOLIC PANEL  PROCALCITONIN  STREP PNEUMONIAE URINARY ANTIGEN  RAPID URINE DRUG SCREEN, HOSP PERFORMED  TROPONIN I (HIGH SENSITIVITY)    EKG None  Radiology CT Angio Chest PE W and/or Wo Contrast  Result Date: 02/18/2022 CLINICAL DATA:  Confusion and drowsiness. High probability for PE. History of prostate cancer. EXAM: CT ANGIOGRAPHY CHEST WITH CONTRAST TECHNIQUE: Multidetector CT imaging of the chest was performed using the standard protocol during bolus administration of intravenous contrast. Multiplanar CT image reconstructions and MIPs were obtained to evaluate the vascular anatomy. RADIATION DOSE REDUCTION: This exam was performed according to the departmental dose-optimization program which includes automated exposure control, adjustment of the mA and/or kV according to patient size and/or use of iterative reconstruction technique. CONTRAST:  27m OMNIPAQUE IOHEXOL 350 MG/ML SOLN COMPARISON:  CT chest abdomen and pelvis 11/01/2021 FINDINGS: Cardiovascular: There is a moderate pericardial effusion which has slightly increased in size. The heart is mildly enlarged, unchanged. Aorta is normal in size. There is adequate opacification of the pulmonary arteries to the segmental level. There is no evidence for pulmonary embolism. Mediastinum/Nodes: There is an enlarged right hilar lymph node measuring 16 mm short axis, new from prior. Are prominent, but nonenlarged left hilar lymph nodes. Visualized esophagus and thyroid gland are within normal limits. Lungs/Pleura: Moderate severe emphysematous changes are similar to the prior study. There is scarring in the right lung apex, unchanged. There is diffuse peribronchial wall  thickening most significant in the bilateral lower lobes where there is some mucous plugging. There are minimal patchy airspace opacities in the lower lobes, left greater than right. No pneumothorax or pleural effusion. Upper Abdomen: No acute abnormality. Musculoskeletal: There is a healed left seventh rib fracture, similar to prior. T10 vertebral body mixed lucent and sclerotic lesion measuring 17 mm is unchanged. Review of the MIP images confirms the above findings. IMPRESSION: 1. No evidence for pulmonary embolism. 2. Moderate pericardial effusion has slightly increased in size. 3. Stable cardiomegaly. 4. Diffuse peribronchial wall thickening with mucous plugging in the lower lobes. There are minimal patchy airspace opacities in the lower lobes, left greater than right. Findings may be related to infection or aspiration. 5. New right hilar lymphadenopathy. 6. Stable T10 vertebral body lesion. Electronically Signed   By: ARonney AstersM.D.   On: 02/18/2022 18:16   CT Head Wo Contrast  Result Date: 02/18/2022 CLINICAL DATA:  Altered mental status. EXAM: CT HEAD WITHOUT CONTRAST TECHNIQUE: Contiguous axial images were obtained from the base of the skull through the vertex without intravenous contrast. RADIATION DOSE REDUCTION: This exam was performed according to the departmental dose-optimization program which includes automated exposure control, adjustment of the mA and/or kV according to patient size and/or use of iterative reconstruction technique. COMPARISON:  None Available. FINDINGS: Brain: No evidence of acute infarction, hemorrhage, hydrocephalus, extra-axial collection or mass lesion/mass effect. Vascular: Calcified atherosclerosis at the skull base. No hyperdense vessel. Skull: Normal. Negative for fracture or focal lesion. Sinuses/Orbits: No acute finding. Other: None. IMPRESSION: 1. No acute intracranial abnormality. Electronically Signed   By: Titus Dubin M.D.   On: 02/18/2022 16:45   DG  Chest Port 1 View  Result Date: 02/18/2022 CLINICAL DATA:  Provided history: Shortness of breath. History of COPD, on oxygen via nasal cannula at baseline. EXAM: PORTABLE CHEST 1 VIEW COMPARISON:  Prior chest radiographs 10/08/2021 and earlier. CT chest/abdomen/pelvis 11/01/2021. FINDINGS: Mild prominence of the cardiopericardial silhouette, similar to the prior chest radiographs of 10/08/2021. Emphysema, better delineated on the prior chest CT of 11/01/2021. Subtle ill-defined opacity within the left lung base head no appreciable airspace consolidation on the right. No evidence of pleural effusion or pneumothorax. No acute bony abnormality identified. Focally expansile appearance of the lateral left seventh rib, similar to the prior chest CT and possibly reflecting chronic sequela of a prior fracture. IMPRESSION: Subtle ill-defined opacity within the left lung base. The appearance favors atelectasis. However, correlate clinically to exclude signs/symptoms that would suggest early pneumonia. Mild prominence of the cardiopericardial silhouette, similar to the prior chest radiographs of 10/08/2021. Of note, a small pericardial effusion was present on the prior chest CT of 11/01/2021. Emphysema (ICD10-J43.9). Electronically Signed   By: Kellie Simmering D.O.   On: 02/18/2022 15:58    Procedures Procedures    Medications Ordered in ED Medications  finasteride (PROSCAR) tablet 5 mg (has no administration in time range)  tamsulosin (FLOMAX) capsule 0.4 mg (has no administration in time range)  abiraterone acetate (ZYTIGA) tablet 1,000 mg (has no administration in time range)  fluticasone furoate-vilanterol (BREO ELLIPTA) 200-25 MCG/ACT 1 puff (has no administration in time range)  enoxaparin (LOVENOX) injection 40 mg (has no administration in time range)  acetaminophen (TYLENOL) tablet 650 mg (has no administration in time range)    Or  acetaminophen (TYLENOL) suppository 650 mg (has no administration in time  range)  ipratropium-albuterol (DUONEB) 0.5-2.5 (3) MG/3ML nebulizer solution 3 mL (has no administration in time range)  Ampicillin-Sulbactam (UNASYN) 3 g in sodium chloride 0.9 % 100 mL IVPB (3 g Intravenous New Bag/Given 02/18/22 2248)  nicotine (NICODERM CQ - dosed in mg/24 hr) patch 7 mg (has no administration in time range)  ferrous sulfate tablet 325 mg (has no administration in time range)  predniSONE (DELTASONE) tablet 40 mg (has no administration in time range)  lactated ringers bolus 1,000 mL (0 mLs Intravenous Stopped 02/18/22 1648)  magnesium sulfate IVPB 2 g 50 mL (0 g Intravenous Stopped 02/18/22 1649)  ipratropium (ATROVENT) 0.02 % nebulizer solution (1 mg  Given 02/18/22 1535)  albuterol (PROVENTIL) (2.5 MG/3ML) 0.083% nebulizer solution (10 mg  Given 02/18/22 1535)  cefTRIAXone (ROCEPHIN) 1 g in sodium chloride 0.9 % 100 mL IVPB (0 g Intravenous Stopped 02/18/22 1720)  azithromycin (ZITHROMAX) 500 mg in sodium chloride 0.9 % 250 mL IVPB (0 mg Intravenous Stopped 02/18/22 1800)  lactated ringers bolus 1,000 mL (0 mLs Intravenous Stopped 02/18/22 1800)  iohexol (OMNIPAQUE) 350 MG/ML injection 75 mL (75 mLs Intravenous Contrast Given 02/18/22 1749)    ED Course/ Medical Decision Making/ A&P                           Medical Decision Making Problems Addressed: Pneumonia of both lower lobes  due to infectious organism: acute illness or injury that poses a threat to life or bodily functions Respiratory distress: acute illness or injury that poses a threat to life or bodily functions  Amount and/or Complexity of Data Reviewed Independent Historian: caregiver and EMS    Details: daughter Labs: ordered. Decision-making details documented in ED Course. Radiology: ordered and independent interpretation performed. Decision-making details documented in ED Course. ECG/medicine tests: ordered and independent interpretation performed. Decision-making details documented in ED Course. Discussion of  management or test interpretation with external provider(s): medicine  Risk Prescription drug management. Decision regarding hospitalization.   Dustin Alvarez is a 67 y.o. male with PMH of prostate cancer with metastasis to the bone, hypertension, GERD, intention tremor, COPD, peripheral arterial disease, iron deficiency anemia, who presents to the ED with confusion and respiratory distress. Exam notable for tach breath sounds bilaterally, hypoxic, tachycardic, increased work of breathing.  Additionally, is febrile to 101.6.  Patient is alert, able to answer simple questions, but unable to talk about a lot of what has been going on recently.  Is having intermittent jerking of his bilateral upper extremities.  After further discussion with the daughter, this is something that he has had before, they have been evaluated for, but by the time they had it evaluated he was no longer having symptoms and further workup was not done.  In the setting of fever and multidrug poly ingestion accidentally, this likely explains his confusion and jerking.  He does take tizanidine, gabapentin, and trazodone, all of which can lead to somnolence and confusion.  He also may be confused because of hypercarbia or hypoxia.  Due to initial vital signs concerning for sepsis with fever, tachycardia, hypoxia, code sepsis was activated, blood cultures obtained as well as lactic acid levels.  Differential diagnosis includes: pneumonia, COPD exacerbation, asthma exacerbation, CHF exacerbation, ACS, pulmonary embolism, pneumothorax.  Could be PNA as CXR has small infiltrate in the left lower lung field, which could be atelectasis but radiology reads as potentially early pneumonia, however no leukocytosis, but is having cough and fever.  Additionally, high in the differential is COPD/Asthma exacerbation as history of such with tight breath sounds bilaterally and occasional wheezing. Unlikely CHF exacerbation as no history of CHF,  although initial BNP is elevated at 1500. Unlikely ACS as troponin initial troponin is 16, no chest pain, EKG shows no signs of ischemia, just sinus tachycardia.  Additionally on the differential is PE as he is tachycardic, febrile, and hypoxic with a history of prostate cancer and bone mets and is more hypercoagulable.  CT pulmonary angiogram of the chest shows no pulmonary embolism, but does show bilateral lower lobe infiltrates concerning for infection.  Doubt Aortic Dissection, Pancreatitis, Pneumothorax, Arrhythmia, Endo/Myo/Pericarditis, Esophageal pathology, or other Emergent pathology.  Given BiPAP, continuous albuterol, IV magnesium, 30 cc/kg IV lactated Ringer, Rocephin, azithromycin.   Most likely community acquired pneumonia with COPD exacerbation as well.   Disposition: Admit.  Taken off of BiPAP.  Requiring 6 L nasal cannula to maintain oxygen saturations, transition down to 4 L.  Contacted medicine for admission.  He was admitted without any further acute events emergency department.  The plan for this patient was discussed with Dr. Maryan Rued, who voiced agreement and who oversaw evaluation and treatment of this patient.          Final Clinical Impression(s) / ED Diagnoses Final diagnoses:  Pneumonia of both lower lobes due to infectious organism  Respiratory distress    Rx / DC  Orders ED Discharge Orders     None         Phyllis Ginger, MD 02/18/22 2258    Blanchie Dessert, MD 02/19/22 [phone]

## 2022-02-18 NOTE — Sepsis Progress Note (Addendum)
Code Sepsis protocol being monitored by [facility]. Notified provider of need to order lactic acid.

## 2022-02-18 NOTE — ED Notes (Signed)
Pt's did not tolerate being off bipap. Spo2 decreased to 86% on 3L and only improved to 89% on 6L. RT placed pt back on bipap. MD notified.

## 2022-02-18 NOTE — ED Triage Notes (Signed)
Pt bib [facility] for intermittent aloc starting today. Pts son reports pt is having episodes of confusion and drowsiness. Hx of COPD - 3L [place] at baseline but has not had any oxygen since Friday. EMS reports pt was initially alert and oriented but became unresponsive after starting duoneb. Reports of new tremors starting today. '125mg'$  solumedrol and 2 duonebs given pta.

## 2022-02-18 NOTE — ED Notes (Signed)
Pt taken off bipap and placed on 6L per MD

## 2022-02-18 NOTE — Progress Notes (Signed)
RT NOTE:  Pt back on BIPAP due to PaO2 88% on 6L. MD aware and RN aware.

## 2022-02-18 NOTE — H&P (Signed)
Date: 02/18/2022               Patient Name:  Dustin Alvarez MRN: [id number]  DOB: [birth year] Age / Sex: 67 y.o., male   PCP: Farrel Gordon, DO         Medical Service: Internal Medicine Teaching Service         Attending Physician: Dr. 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  Most of patient's history was obtained from his daughter.  She states that he stayed at his girlfriend's place since Friday 1-5 and did not have his oxygen with him.  Patient's daughter lives nearby and checked on him this morning.  She noticed that patient was confused with more labored breathing and wheezing.  When questioned, the patient told his daughter that he was feeling fine.  Later on, however, his girlfriend notified daughter that he was more confused and noticeably short of breath.  When daughter checked on him, patient was somnolent and fatigued, which she described as "intoxicated".  Patient was in respiratory distress with garbled speech and tremors.  She denies loss of consciousness, urinary-bowel incontinence, or seizure activity.  That was when she called EMS.  Daughter also noticed that patient may have taken all his Monday and Tuesday morning medications, which include his pain medication.  Patient was on BiPAP initially during interview, but was successfully transition to 3L/min nasal cannula.  Patient was unsure of  what happened today.  He denies fever, chills, coughing, and worsening sputum production.  He endorses wheezing, for which he used his albuterol inhaler 3 times a day.  Reports adherence to his Trelegy and other oral medications.  He further denies abdominal pain, nausea, vomiting, diarrhea, and constipation.   ED Course: Upon arrival to the ED vitals were notable for BP 139/106, RR 23, Temp 101.6, and HR 120. Laboratory testing demonstrated Cl 97, Cr 1.34, GFR 58, BNP 1537, Hgb 10.6, MCV 76, Bicarb 31.6, and Trop 18. Chest radiograph revealed left lung base opacity concerning for atelectasis or pneumonia and emphysema. Head CT negative for acute pathology and chest angiogram without evidence of pulmonary embolism, showing airspace opacities in lower lobes left greater than right. Albuterol and ipratropium were administered in the ED. Two liters of lactated ringer and magnesium sulfate were given. Ceftriaxone and azithromycin started for possible pneumonia.   Meds:  No outpatient medications have been marked as taking for the 02/18/22 encounter [facility] Encounter).    Past Medical History  Past Surgical History:  Procedure Laterality Date   ABDOMINAL AORTOGRAM W/LOWER EXTREMITY N/A 10/26/2020   Procedure: ABDOMINAL AORTOGRAM W/LOWER EXTREMITY;  Surgeon: Marty Heck, MD;  Location: [place] [facility];  Service: Cardiovascular;  Laterality: N/A;   COLONOSCOPY WITH PROPOFOL N/A 05/22/2021   Procedure: COLONOSCOPY WITH PROPOFOL;  Surgeon: Doran Stabler, MD;  Location: [facility];  Service: Gastroenterology;  Laterality: N/A;   ESOPHAGOGASTRODUODENOSCOPY (EGD) WITH PROPOFOL N/A 05/22/2021   Procedure: ESOPHAGOGASTRODUODENOSCOPY (EGD) WITH PROPOFOL;  Surgeon: Doran Stabler, MD;  Location: [facility];  Service: Gastroenterology;  Laterality: N/A;   HEMOSTASIS CLIP PLACEMENT  05/22/2021   Procedure: HEMOSTASIS CLIP PLACEMENT;  Surgeon: Doran Stabler, MD;  Location: [facility]OS[facility];   Service: Gastroenterology;;   HOT HEMOSTASIS N/A 05/22/2021   Procedure: HOT HEMOSTASIS (ARGON PLASMA COAGULATION/BICAP);  Surgeon: Doran Stabler, MD;  Location: [facility];  Service: Gastroenterology;  Laterality: N/A;   WRIST SURGERY Left 2017     Social:  Lives With: himself Occupation: none Support: family Level of Function: independent in ADLs and IADLs PCP: Farrel Gordon DO Substances: smokes about 5-6 cigarettes daily which is less than 0.5ppd previously, drinks alcohol very occasionally about once per month, denies marijuana or any other drugs including cocaine and heroin    Family History:  Family History  Problem Relation Age of Onset   COPD Father    Diabetes Sister    Diabetes Brother    Breast cancer Neg Hx    Prostate cancer Neg Hx    Colon cancer Neg Hx    Pancreatic cancer Neg Hx    Stomach cancer Neg Hx    Esophageal cancer Neg Hx       Allergies: Allergies as of 02/18/2022 - Review Complete 02/18/2022  Allergen Reaction Noted   Ace inhibitors Swelling 05/22/2017   Lisinopril Swelling 06/06/2020      Review of Systems: A complete ROS was negative except as per HPI.    OBJECTIVE:   Physical Exam: Blood pressure (!) 141/89, pulse 96, temperature 98.5 F (36.9 C), temperature source Axillary, resp. rate 20, height '5\' 10"'$  (1.778 m), weight 72 kg, SpO2 100 %.   General:      awake and alert, lying in bed, cooperative, breathing through BiPAP and then 3L/min oxygen via [place], not in acute distress  Head:      normocephalic and atraumatic, oral mucosa moist Eyes:      extraocular movements intact, conjunctivae pink, pupils round Lungs:      increased respiratory effort, symmetrical chest rise, significant wheezing throughout all fields bilaterally, no crackles Cardiac:      regular rate and rhythm, normal S1 and S2, non-pulsatile JVD, trace pitting edema Abdomen:      mildly distended, normoactive bowel sounds, no tenderness to palpation or  guarding Musculoskeletal:  motor strength 5- /5 in all four extremities except 2- /5 with right dorsiflexion Neurologic:      oriented to person-place-time, moving all extremities, sensation to light touch and pinprick sensation intact and symmetrical, no gross focal deficits Psychiatric:      euthymic mood with congruent affect, intelligible speech   Labs: CBC    Component Value Date/Time   WBC 5.3 02/18/2022 1530   RBC 4.41 02/18/2022 1530   HGB 11.6 (L) 02/18/2022 1923   HGB 10.3 (L) 01/16/2022 1352   HCT 34.0 (L) 02/18/2022 1923   PLT 219 02/18/2022 1530   PLT 260 01/16/2022 1352   MCV 76.0 (L) 02/18/2022 1530   MCH 24.0 (L) 02/18/2022 1530   MCHC 31.6 02/18/2022 1530   RDW 18.6 (H) 02/18/2022 1530   LYMPHSABS 0.8 02/18/2022 1530   MONOABS 0.6 02/18/2022 1530   EOSABS 0.0 02/18/2022 1530   BASOSABS 0.0 02/18/2022 1530     CMP     Component Value Date/Time   NA 138  02/18/2022 1923   NA 137 05/15/2021 1519   K 3.8 02/18/2022 1923   CL 96 (L) 02/18/2022 1538   CO2 30 02/18/2022 1530   GLUCOSE 128 (H) 02/18/2022 1538   BUN 24 (H) 02/18/2022 1538   BUN 11 05/15/2021 1519   CREATININE 1.40 (H) 02/18/2022 1538   CREATININE 0.95 01/16/2022 1352   CALCIUM 8.2 (L) 02/18/2022 1530   PROT 7.8 02/18/2022 1530   PROT 7.7 07/24/2020 1700   ALBUMIN 3.6 02/18/2022 1530   ALBUMIN 4.5 07/24/2020 1700   AST 17 02/18/2022 1530   AST 11 (L) 01/16/2022 1352   ALT 11 02/18/2022 1530   ALT 6 01/16/2022 1352   ALKPHOS 84 02/18/2022 1530   BILITOT 0.7 02/18/2022 1530   BILITOT 0.6 01/16/2022 1352   GFRNONAA 58 (L) 02/18/2022 1530   GFRNONAA >60 01/16/2022 1352   GFRAA >60 11/05/2019 1515     Imaging:  CT Angio Chest PE W and/or Wo Contrast Result Date: 02/18/2022 IMPRESSION: 1. No evidence for pulmonary embolism. 2. Moderate pericardial effusion has slightly increased in size. 3. Stable cardiomegaly. 4. Diffuse peribronchial wall thickening with mucous plugging in the lower  lobes. There are minimal patchy airspace opacities in the lower lobes, left greater than right. Findings may be related to infection or aspiration. 5. New right hilar lymphadenopathy. 6. Stable T10 vertebral body lesion.   CT Head Wo Contrast Result Date: 02/18/2022 IMPRESSION: 1. No acute intracranial abnormality.    DG Chest Port 1 View Result Date: 02/18/2022 IMPRESSION: Subtle ill-defined opacity within the left lung base. The appearance favors atelectasis. However, correlate clinically to exclude signs/symptoms that would suggest early pneumonia. Mild prominence of the cardiopericardial silhouette, similar to the prior chest radiographs of 10/08/2021. Of note, a small pericardial effusion was present on the prior chest CT of 11/01/2021.    ECG: My personal interpretation is sinus tachycardia with low voltage in leads I-aVR-aVL-aVF, which is new from prior ECG on 06-27-2021.    ASSESSMENT & PLAN:   Assessment & Plan by Problem: Principal Problem:   Acute encephalopathy Active Problems:   Prostate cancer metastatic to bone (HCC)   Hypertension   COPD with acute exacerbation (HCC)   Iron deficiency anemia due to chronic blood loss   Acute-on-chronic respiratory failure (HCC)   Aspiration pneumonia (HCC)   Pericardial effusion without cardiac tamponade   Hilar lymphadenopathy   AKI (acute kidney injury) [facility])   Mr Buffalo is a 67 year old male with a past medical history of metastatic prostate cancer, lumbar degenerative disc disease, gastroesophageal reflux disease, tobacco use, chronic obstructive pulmonary disease, peripheral artery disease, peripheral neuropathy, and hypertension who presents with acute encephalopathy.   ---Acute on chronic hypoxic respiratory failure ---Chronic obstructive pulmonary disease exacerbation ---Lower lobe airspace opacities ---Tobacco use Patient has history of chronic obstructive pulmonary disease and currently smokes 5-6 cigarettes per day. He  was brought to the [facility] ED by daughter on 1-8 for breath shortness and confusion after spending the weekend without his home 3L/min oxygen requirement. Upon arrival vitals were notable for fever, tachypnea, tachycardia, and SpO2 93 that improved with BiPAP. Imaging revealed bilateral airspace opacities, left greater than right, concerning for atelectasis or possible pneumonia. Etiology of current presentation most likely hypoxia secondary to decreased oxygen delivery. Subsequent confusion may have led to aspiration event, which explains pulmonary findings on imaging, possibly causing pneumonia or triggering concurrent COPD exacerbation. > Ipratropium-albuterol '3mg'$  q4 > Fluticasone-vilanterol 200-25ug q24 > Ampicillin-sulbactam 3g q6 > Prednisone '40mg'$  q24 >  Nicotine patch '7mg'$  q24  > Supplemental oxygen as needed to maintain SpO2 88-92% > Check procalcitonin > Trend CBC q24 > Follow sputum culture, no growth day 0   ---Acute encephalopathy Patient spent the weekend without his 3L/min home oxygen requirement and subsequently became confused. History negative for seizure-like activity including unresponsiveness, full-body shaking, tongue biting, or bowel or bladder incontinence. Per daughter, the patient then may have taken two-days worth of his medications all at once. Upon arrival, laboratory testing negative for signs of infection and vitals have improved with oxygen therapy. Head CT negative for acute pathology. Etiology of acute encephalopathy most likely oxygen deprivation leading to hypoxia, however, polypharmacy and pulmonary infection are possible contributing factors as well.  > Check rapid drug screen > Check urinalysis  > Check S.pneumoniae urine antigen > Trend CBC q24 > Follow blood culture, no growth day 0   ---Acute kidney injury Upon arrival, laboratory testing demonstrated creatinine 1.40 elevated above baseline of about 0.7-1.0. Etiology likely reduced oral intake in setting of  encephalopathy. Given lack of nephrotoxic home medications, polypharmacy is probably non-contributory. Two liters of LR were administered in the ED. > Check urinalysis  > Trend BMP q24   ---Moderate pericardial effusion  ---Right-sided heart failure Upon arrival, chest CT angiogram and echocardiogram revealed moderate pericardial effusion. Echocardiogram further demonstrated severe right ventricular enlargement and dysfunction consistent with cor pulmonale. Electrocardiogram notable for low voltage across multiple leads. Although physical exam was notable for jugular venous distension, absence of hypotension and muffled heart sounds provide convincing evidence against cardiac tamponade.  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 He currently follows with oncology team and is currently receiving combination chemotherapy that includes abiraterone plus prednisone. Pain is managed at home with gabapentin, oxycodone, and tizanidine. Given improvement in mental status, primary team will resume most oral home medications except those that are centrally acting or sedating. > Abiraterone acetate '1000mg'$  q24 > Prednisone '40mg'$  q24 > Tamsulosin 0.'4mg'$  q24 > Acetaminophen '650mg'$  q6 PRN > Consider resuming home gabapentin and oxycodone tomorrow 1-9   ---Right hilar lymphadenopathy  Upon arrival, chest CT angiogram revealed right hilar lymphadenopathy new from prior study in 10-2021. Etiology remains unclear at this time, likely either cancer metastasis or active infection. Primary team is recommending repeat imaging in outpatient setting after resolution of current symptoms. > Consider repeat chest imaging as outpatient   ---Hypertension Patient has history of  hypertension managed at home with amlodipine and torsemide. Upon arrival, blood pressure mildly elevated but has since returned to normal range. Physical examination revealed non-pulsatile JVD and trace pitting edema. Given normal blood pressure values during current hospitalization, primary team will continue holding home antihypertensive medications. > Monitor clinically  > Consider resuming home amlodipine and torsemide if hypertensive   ---Iron deficiency anemia  Patient has history of iron deficiency anemia managed at home with iron supplementation. Upon arrival, hemoglobin was 10.6 which is consistent with his baseline values. > Ferrous sulfate '325mg'$  q24   ---Insomnia Patient has a history of insomnia that he manages at home with trazodone nightly. Primary team is currently holding this medication in setting of acute encephalopathy. > Consider resuming home trazodone after mental status improves    Diet: Normal VTE: Enoxaparin IVF: None,None Code: Full  Prior to Admission Living Arrangement: Home, living alone Anticipated  Discharge Location: Home Barriers to Discharge: medical management, oxygen requirement  Dispo: Admit patient to Observation with expected length of stay less than 2 midnights.  Signed: Serita Butcher, MD Internal Medicine Resident PGY-1  02/18/2022, 10:06 PM

## 2022-02-18 NOTE — ED Notes (Signed)
Pt taken off bipap and placed on 3L.

## 2022-02-18 NOTE — Progress Notes (Signed)
Patient transported from trauma room B, to CT and back, on bipap without complications.

## 2022-02-18 NOTE — Progress Notes (Signed)
Transported from trauma room B to CT and back without complications.

## 2022-02-18 NOTE — ED Provider Notes (Incomplete)
Patient is a 67 year old male with multiple medical problems presenting today with EMS for respiratory distress, altered mental status and generalized twitching.  Patient's family did report he took an additional dose of his medication as he was confused today and mildly encephalopathic.  Patient is found to be febrile here at 101.6, he did receive Solu-Medrol and albuterol and Atrovent prior to arrival.  Sats are greater than 90% on nonrebreather upon arrival.  Patient is noted to have jerking movements of the arms and legs but will respond to questions.  Concern for sepsis and new acute infectious etiology pneumonia versus viral etiology.  He does have a prior history of prostate cancer possible UTI.  He has no evidence concerning for cellulitis and has no significant abdominal pain.  This order set initiated patient was covered with respiratory antibiotics.  CO2 is at 68 but bicarb is also elevated and compensated with normal pH, viral panel is negative, CMP with mild AKI.  Patient's BNP is elevated 1500 however he does not have obvious signs of fluid overload on exam or on chest x-ray.  Troponin within normal limits head CT negative and chest x-ray without significant findings however radiology was concern for possible early developing pneumonia.  CTA to further evaluate.  Patient upon arrival to the ED was transitioned to BiPAP and is now resting comfortably.  CTA shows concern for airspace opacity and stable cardiomegaly with a moderate pericardial effusion which is slightly increased from prior without evidence of PE.  Will attempt to transition patient to nasal cannula.  Will require admission for further care.  CRITICAL CARE Performed by: Tyshia Fenter Total critical care time: 30 minutes Critical care time was exclusive of separately billable procedures and treating other patients. Critical care was necessary to treat or prevent imminent or life-threatening deterioration. Critical care was time  spent personally by me on the following activities: development of treatment plan with patient and/or surrogate as well as nursing, discussions with consultants, evaluation of patient's response to treatment, examination of patient, obtaining history from patient or surrogate, ordering and performing treatments and interventions, ordering and review of laboratory studies, ordering and review of radiographic studies, pulse oximetry and re-evaluation of patient's condition.

## 2022-02-18 NOTE — Progress Notes (Signed)
Pharmacy Antibiotic Note  Dustin Alvarez is a 67 y.o. male for which pharmacy has been consulted for ampicillin-sulbactam dosing for  asp pna .  Plan: Unasyn 3g q6h Trend WBC, Fever, Renal function F/u cultures, clinical course, WBC De-escalate when able  Height: '5\' 10"'$  (177.8 cm) Weight: 72 kg (158 lb 11.7 oz) IBW/kg (Calculated) : 73  Temp (24hrs), Avg:100.1 F (37.8 C), Min:98.5 F (36.9 C), Max:101.6 F (38.7 C)  Recent Labs  Lab 02/18/22 1530 02/18/22 1538 02/18/22 1733  WBC 5.3  --   --   CREATININE 1.34* 1.40*  --   LATICACIDVEN  --   --  1.4    Estimated Creatinine Clearance: 51.4 mL/min (A) (by C-G formula based on SCr of 1.4 mg/dL (H)).    Allergies  Allergen Reactions   Ace Inhibitors Swelling    lisinopril   Lisinopril Swelling   Thank you for allowing pharmacy to be a part of this patient's care.  Lorelei Pont, PharmD, BCPS 02/18/2022 10:16 PM ED Clinical Pharmacist -  [phone]

## 2022-02-18 NOTE — Progress Notes (Signed)
RT called to patient room due to patient being brought in by EMS as altered and low sats.  Upon arrival, patient was receiving a nebulizer treatment but would respond to name and follow commands.  Patient noted to have diminished breath sounds throughout with some expiratory wheezes.  Per MD order, patient placed on bipap with continuous nebulizer treatment.  Patient is tolerating well at this time.  Will continue to monitor.

## 2022-02-19 ENCOUNTER — Encounter: Payer: Self-pay | Admitting: Hematology and Oncology

## 2022-02-19 ENCOUNTER — Ambulatory Visit (HOSPITAL_COMMUNITY): Payer: Medicare Other

## 2022-02-19 ENCOUNTER — Ambulatory Visit: Payer: Medicare Other | Admitting: Vascular Surgery

## 2022-02-19 ENCOUNTER — Other Ambulatory Visit (HOSPITAL_COMMUNITY): Payer: Self-pay

## 2022-02-19 DIAGNOSIS — G9340 Encephalopathy, unspecified: Secondary | ICD-10-CM | POA: Diagnosis not present

## 2022-02-19 DIAGNOSIS — J189 Pneumonia, unspecified organism: Secondary | ICD-10-CM

## 2022-02-19 DIAGNOSIS — F17210 Nicotine dependence, cigarettes, uncomplicated: Secondary | ICD-10-CM

## 2022-02-19 DIAGNOSIS — R0603 Acute respiratory distress: Secondary | ICD-10-CM | POA: Diagnosis not present

## 2022-02-19 LAB — CBC
HCT: 31.7 % — ABNORMAL LOW (ref 39.0–52.0)
Hemoglobin: 10.1 g/dL — ABNORMAL LOW (ref 13.0–17.0)
MCH: 24.2 pg — ABNORMAL LOW (ref 26.0–34.0)
MCHC: 31.9 g/dL (ref 30.0–36.0)
MCV: 75.8 fL — ABNORMAL LOW (ref 80.0–100.0)
Platelets: 203 10*3/uL (ref 150–400)
RBC: 4.18 MIL/uL — ABNORMAL LOW (ref 4.22–5.81)
RDW: 19 % — ABNORMAL HIGH (ref 11.5–15.5)
WBC: 4.4 10*3/uL (ref 4.0–10.5)
nRBC: 0 % (ref 0.0–0.2)

## 2022-02-19 LAB — BASIC METABOLIC PANEL
Anion gap: 10 (ref 5–15)
BUN: 19 mg/dL (ref 8–23)
CO2: 29 mmol/L (ref 22–32)
Calcium: 7.8 mg/dL — ABNORMAL LOW (ref 8.9–10.3)
Chloride: 97 mmol/L — ABNORMAL LOW (ref 98–111)
Creatinine, Ser: 1.03 mg/dL (ref 0.61–1.24)
GFR, Estimated: >60 mL/min (ref >60)
Glucose, Bld: 155 mg/dL — ABNORMAL HIGH (ref 70–99)
Potassium: 4 mmol/L (ref 3.5–5.1)
Sodium: 136 mmol/L (ref 135–145)

## 2022-02-19 LAB — PROCALCITONIN: Procalcitonin: 0.47 ng/mL

## 2022-02-19 LAB — MRSA NEXT GEN BY PCR, NASAL: MRSA by PCR Next Gen: NOT DETECTED

## 2022-02-19 LAB — HIV ANTIBODY (ROUTINE TESTING W REFLEX): HIV Screen 4th Generation wRfx: NONREACTIVE

## 2022-02-19 LAB — CBG MONITORING, ED: Glucose-Capillary: 154 mg/dL — ABNORMAL HIGH (ref 70–99)

## 2022-02-19 MED ORDER — AZITHROMYCIN 250 MG PO TABS
250.0000 mg | ORAL_TABLET | Freq: Every day | ORAL | Status: DC
  Administered 2022-02-19: 250 mg via ORAL
  Filled 2022-02-19: qty 1

## 2022-02-19 MED ORDER — AMOXICILLIN-POT CLAVULANATE 875-125 MG PO TABS
1.0000 | ORAL_TABLET | Freq: Two times a day (BID) | ORAL | 0 refills | Status: AC
  Filled 2022-02-19: qty 8, 4d supply, fill #0

## 2022-02-19 MED ORDER — AZITHROMYCIN 250 MG PO TABS
250.0000 mg | ORAL_TABLET | Freq: Every day | ORAL | 0 refills | Status: AC
  Filled 2022-02-19: qty 2, 2d supply, fill #0

## 2022-02-19 MED ORDER — PREDNISONE 20 MG PO TABS
40.0000 mg | ORAL_TABLET | Freq: Every day | ORAL | 0 refills | Status: AC
  Filled 2022-02-19: qty 8, 4d supply, fill #0

## 2022-02-19 NOTE — Discharge Summary (Signed)
Name: Dustin Alvarez MRN: [id number] DOB: September 09, 1955 67 y.o. PCP: Dustin Gordon, DO  Date of Admission: 02/18/2022  3:20 PM Date of Discharge: 02/19/2022 Attending Physician: Dr. Lottie Mussel   Discharge Diagnosis: Principal Problem:   Acute encephalopathy Active Problems:   Prostate cancer metastatic to bone [facility])   Hypertension   COPD with acute exacerbation (HCC)   Iron deficiency anemia due to chronic blood loss   Acute-on-chronic respiratory failure (HCC)   Aspiration pneumonia (HCC)   Pericardial effusion without cardiac tamponade   Hilar lymphadenopathy   AKI (acute kidney injury) ([place])   Pneumonia of both lower lobes due to infectious organism   Respiratory distress    Discharge Medications: Allergies as of 02/19/2022       Reactions   Ace Inhibitors Swelling   lisinopril   Lisinopril Swelling        Medication List     STOP taking these medications    potassium chloride SA 20 MEQ tablet Commonly known as: KLOR-CON M       TAKE these medications    abiraterone acetate 250 MG tablet Commonly known as: ZYTIGA TAKE 4 TABLETS BY MOUTH DAILY. TAKE ON AN EMPTY STOMACH 1 HOUR BEFORE OR 2 HOURS AFTER A MEAL   albuterol 108 (90 Base) MCG/ACT inhaler Commonly known as: VENTOLIN HFA INHALE TWO puffs into THE lungs EVERY SIX HOURS AS NEEDED wheezing OR For SHORTNESS OF BREATH What changed: See the new instructions.   albuterol (2.5 MG/3ML) 0.083% nebulizer solution Commonly known as: PROVENTIL Take 3 mLs (2.5 mg total) by nebulization every 6 (six) hours as needed for wheezing or shortness of breath. What changed: Another medication with the same name was changed. Make sure you understand how and when to take each.   amLODipine 5 MG tablet Commonly known as: NORVASC Take 1 tablet (5 mg total) by mouth daily.   amoxicillin-clavulanate 875-125 MG tablet Commonly known as: AUGMENTIN Take 1 tablet by mouth 2 (two) times daily for 4 days.   azithromycin  250 MG tablet Commonly known as: ZITHROMAX Take 1 tablet (250 mg total) by mouth daily for 2 days.   calcium-vitamin D 500-200 MG-UNIT tablet Commonly known as: OSCAL WITH D Take 1 tablet by mouth daily with breakfast.   calcium-vitamin D 500-5 MG-MCG tablet Commonly known as: OSCAL WITH D Take 1 tablet by mouth daily with breakfast.   Constulose 10 GM/15ML solution Generic drug: lactulose Take 30 mLs (20 g total) by mouth 2 (two) times daily as needed for mild constipation.   docusate sodium 100 MG capsule Commonly known as: COLACE Take 200 mg by mouth daily as needed for mild constipation.   ferrous sulfate 325 (65 FE) MG tablet Take 1 tablet (325 mg total) by mouth daily with breakfast. Please take with a source of Vitamin C   finasteride 5 MG tablet Commonly known as: PROSCAR Take 1 tablet (5 mg total) by mouth daily.   gabapentin 300 MG capsule Commonly known as: NEURONTIN TAKE TWO CAPSULES BY MOUTH TWICE DAILY   lidocaine 5 % Commonly known as: LIDODERM place ONE PATCH onto THE SKIN daily. REMOVE AND discard WITHIN 12 HOURS OR AS DIRECTED What changed: See the new instructions.   nicotine 10 MG inhaler Commonly known as: NICOTROL Inhale 1 continuous puffing into the lungs as needed for smoking cessation.   oxyCODONE 5 MG immediate release tablet Commonly known as: Oxy IR/ROXICODONE Take 1-2 tablets (5-10 mg total) by mouth every 4 (four) hours  as needed for moderate to severe pain, or breakthrough pain.   pantoprazole 40 MG tablet Commonly known as: PROTONIX Take 1 tablet (40 mg total) by mouth daily.   polyethylene glycol 17 g packet Commonly known as: MiraLax Mix 17 grams (1 packet) with 4-8 ounces of liquid and take by mouth 2 (two) times daily.   predniSONE 5 MG tablet Commonly known as: DELTASONE TAKE ONE TABLET BY MOUTH EVERY DAY with breakfast .**Do not take prior TO taking Abiraterone**] What changed: See the new instructions.   predniSONE 20 MG  tablet Commonly known as: DELTASONE Take 2 tablets (40 mg total) by mouth daily with breakfast for 4 days. Start taking on: February 20, 2022 What changed: You were already taking a medication with the same name, and this prescription was added. Make sure you understand how and when to take each.   senna-docusate 8.6-50 MG tablet Commonly known as: Senna S Take 2 tablets by mouth daily. What changed:  when to take this reasons to take this   sildenafil 100 MG tablet Commonly known as: Viagra Take 1 tablet (100 mg total) by mouth daily as needed for erectile dysfunction.   silver sulfADIAZINE 1 % cream Commonly known as: SILVADENE Apply to left great toe once daily.   sorbitol 70 % Soln Take 15 mLs by mouth daily as needed.   tamsulosin 0.4 MG Caps capsule Commonly known as: FLOMAX Take 1 capsule (0.4 mg total) by mouth in the morning and at bedtime.   tiZANidine 2 MG tablet Commonly known as: ZANAFLEX Take 1 tablet (2 mg total) by mouth 2 (two) times daily as needed for muscle spasms.   torsemide 20 MG tablet Commonly known as: DEMADEX Take 2 tablets (40 mg total) by mouth daily.   traZODone 100 MG tablet Commonly known as: DESYREL Take 0.5 tablets (50 mg total) by mouth at bedtime.   Trelegy Ellipta 200-62.5-25 MCG/ACT Aepb Generic drug: Fluticasone-Umeclidin-Vilant Inhale 1 puff into the lungs daily. Use once daily   Xtampza ER 36 MG C12a Generic drug: oxyCODONE ER Take 1 capsule (36 mg total) by mouth every 8 (eight) hours.               Durable Medical Equipment  (From admission, onward)           Start     Ordered   02/19/22 1414  For home use only DME oxygen  Once       Question Answer Comment  Length of Need Lifetime   Mode or (Route) Nasal cannula   Liters per Minute 3   Frequency Continuous (stationary and portable oxygen unit needed)   Oxygen delivery system Gas      02/19/22 1414            Disposition and follow-up:    [name] was discharged from [facility] in Good condition.  At the hospital follow up visit please address:  1.  Follow-up:  a.  Acute on chronic hypoxic respiratory failure secondary to pneumonia and COPD exacerbation: Patient discharged on antibiotics, and discharged on steroids. Ensure in the outpatient setting that patient respiratory status remains stable.  Ensure patient completes antibiotics and steroids.  Patient discharged on azithromycin and Augmentin.    b.  Moderate pericardial effusion: Slightly increased from previous pericardial effusion.  Nothing to do.  Follow-up outpatient.  Continue torsemide outpatient.   c.  Incidental finding of right hilar lymphadenopathy: Patient had incidental finding of right hilar lymphadenopathy.  Repeat chest CT in the outpatient setting.   d.  AKI: Patient initially presented with elevated creatinine concerning for AKI.  Creatinine trended back to baseline during hospitalization.  Please repeat BMP on outpatient follow-up to ensure patient's kidney function remains at baseline.  2.  Labs / imaging needed at time of follow-up: BMP, CBC  3.  Pending labs/ test needing follow-up: N/A  4.  Medication Changes  1) Patient discharged on Augmentin for the next 4 days 2) Patient discharged on azithromycin for the next 2 days 3) Patient discharged on steroids for the next 4 days Ensure patient completes all courses  Follow-up Appointments:  Follow-up Information     Virl Axe, MD. Go on 02/26/2022.   Specialty: Internal Medicine Why: Please show up to appointment at 2:15 PM.  At the internal medicine center. Contact information: [place] [address] [phone]                 Hospital Course by problem list:  #Acute encephalopathy #Acute hypoxic respiratory failure #Community-acquired pneumonia #COPD exacerbation likely secondary to above Patient initially presented with concerns of  shortness of breath and wheezing.  Patient was altered.  Patient admitted for acute hypoxic respiratory failure likely secondary to pneumonia and COPD exacerbation, requiring BiPAP.  Acute PE was ruled out.  Patient was started on CAP coverage with ceftriaxone, and azithromycin.  On admission, patient was transition to Unasyn and azithromycin.  Patient also on steroids as well.  During admission, patient respiratory status came back to baseline and patient was satting well at 3 L nasal cannula.  Patient's mental status has also came back down to baseline.  Acute encephalopathy likely secondary to hypoxia.  Patient shortness of breath resolved.  Patient ambulated, and pulse ox did not drop below 90% on 4L oxygen.  Patient discharged on course of Augmentin as well as azithromycin.  Patient to finish steroids outpatient.  #Prerenal AKI Patient initially presented with elevated creatinine.  After receiving fluids, creatinine slowly trended back to baseline.  #Moderate pericardial effusion CT chest showed evidence of moderate pericardial effusion, slightly increased from previous echo in December 2023.  Patient has been worked up in the outpatient setting.  No concern for tamponade during hospitalization.  Patient to continue torsemide outpatient.  #Incidental finding of right hilar lymphadenopathy Patient had chest imaging showing new right hilar lymphadenopathy.  Unclear on what this could be.  Will need to be followed up outpatient with repeat imaging.  #Metastatic prostate cancer Patient remained on home medications for metastatic prostate cancer.  No concerns during hospitalization.  #Hypertension Patient's blood pressure remained within normal limits during hospitalization.  Patient discharged on home blood pressure medications.  #Iron deficiency anemia Patient continued on home iron supplement during hospitalization.  #Insomnia Patient trazodone was held during hospitalization given patient's  acute encephalopathy.  Patient mental state became normal during hospitalization.  Patient discharged home on home trazodone.  Discharge Subjective:  Patient evaluated bedside this morning.  He has no concerns this morning.  He states his breathing is better.  He denies any chest pain.  He denies any fever or chills.  He states he is doing great.  His 1 concern today is that he would like to have portable oxygen.  Discharge Exam:   BP 134/84   Pulse 97   Temp 98.5 F (36.9 C) (Oral)   Resp 15   Ht '5\' 10"'$  (1.778 m)   Wt 72 kg   SpO2 90%  BMI 22.78 kg/m  Constitutional: Resting in bed comfortably in no acute distress, nasal cannula in place HENT: normocephalic atraumatic Eyes: conjunctiva non-erythematous Cardiovascular: regular rate and rhythm, no m/r/g Pulmonary/Chest: Wheezing noted throughout, with poor expiratory effort Abdominal: soft, non-tender, non-distended  Pertinent Labs, Studies, and Procedures:     Latest Ref Rng & Units 02/19/2022    4:45 AM 02/18/2022    7:23 PM 02/18/2022    3:38 PM  CBC  WBC 4.0 - 10.5 K/uL 4.4     Hemoglobin 13.0 - 17.0 g/dL 10.1  11.6  12.9    12.9   Hematocrit 39.0 - 52.0 % 31.7  34.0  38.0    38.0   Platelets 150 - 400 K/uL 203          Latest Ref Rng & Units 02/19/2022    4:45 AM 02/18/2022    7:23 PM 02/18/2022    3:38 PM  CMP  Glucose 70 - 99 mg/dL 155   128   BUN 8 - 23 mg/dL 19   24   Creatinine 0.61 - 1.24 mg/dL 1.03   1.40   Sodium 135 - 145 mmol/L 136  138  139    139   Potassium 3.5 - 5.1 mmol/L 4.0  3.8  4.2    4.2   Chloride 98 - 111 mmol/L 97   96   CO2 22 - 32 mmol/L 29     Calcium 8.9 - 10.3 mg/dL 7.8       CT Angio Chest PE W and/or Wo Contrast  Result Date: 02/18/2022 CLINICAL DATA:  Confusion and drowsiness. High probability for PE. History of prostate cancer. EXAM: CT ANGIOGRAPHY CHEST WITH CONTRAST TECHNIQUE: Multidetector CT imaging of the chest was performed using the standard protocol during bolus administration  of intravenous contrast. Multiplanar CT image reconstructions and MIPs were obtained to evaluate the vascular anatomy. RADIATION DOSE REDUCTION: This exam was performed according to the departmental dose-optimization program which includes automated exposure control, adjustment of the mA and/or kV according to patient size and/or use of iterative reconstruction technique. CONTRAST:  69m OMNIPAQUE IOHEXOL 350 MG/ML SOLN COMPARISON:  CT chest abdomen and pelvis 11/01/2021 FINDINGS: Cardiovascular: There is a moderate pericardial effusion which has slightly increased in size. The heart is mildly enlarged, unchanged. Aorta is normal in size. There is adequate opacification of the pulmonary arteries to the segmental level. There is no evidence for pulmonary embolism. Mediastinum/Nodes: There is an enlarged right hilar lymph node measuring 16 mm short axis, new from prior. Are prominent, but nonenlarged left hilar lymph nodes. Visualized esophagus and thyroid gland are within normal limits. Lungs/Pleura: Moderate severe emphysematous changes are similar to the prior study. There is scarring in the right lung apex, unchanged. There is diffuse peribronchial wall thickening most significant in the bilateral lower lobes where there is some mucous plugging. There are minimal patchy airspace opacities in the lower lobes, left greater than right. No pneumothorax or pleural effusion. Upper Abdomen: No acute abnormality. Musculoskeletal: There is a healed left seventh rib fracture, similar to prior. T10 vertebral body mixed lucent and sclerotic lesion measuring 17 mm is unchanged. Review of the MIP images confirms the above findings. IMPRESSION: 1. No evidence for pulmonary embolism. 2. Moderate pericardial effusion has slightly increased in size. 3. Stable cardiomegaly. 4. Diffuse peribronchial wall thickening with mucous plugging in the lower lobes. There are minimal patchy airspace opacities in the lower lobes, left greater  than right. Findings may be related to  infection or aspiration. 5. New right hilar lymphadenopathy. 6. Stable T10 vertebral body lesion. Electronically Signed   By: Ronney Asters M.D.   On: 02/18/2022 18:16   CT Head Wo Contrast  Result Date: 02/18/2022 CLINICAL DATA:  Altered mental status. EXAM: CT HEAD WITHOUT CONTRAST TECHNIQUE: Contiguous axial images were obtained from the base of the skull through the vertex without intravenous contrast. RADIATION DOSE REDUCTION: This exam was performed according to the departmental dose-optimization program which includes automated exposure control, adjustment of the mA and/or kV according to patient size and/or use of iterative reconstruction technique. COMPARISON:  None Available. FINDINGS: Brain: No evidence of acute infarction, hemorrhage, hydrocephalus, extra-axial collection or mass lesion/mass effect. Vascular: Calcified atherosclerosis at the skull base. No hyperdense vessel. Skull: Normal. Negative for fracture or focal lesion. Sinuses/Orbits: No acute finding. Other: None. IMPRESSION: 1. No acute intracranial abnormality. Electronically Signed   By: Titus Dubin M.D.   On: 02/18/2022 16:45   DG Chest Port 1 View  Result Date: 02/18/2022 CLINICAL DATA:  Provided history: Shortness of breath. History of COPD, on oxygen via nasal cannula at baseline. EXAM: PORTABLE CHEST 1 VIEW COMPARISON:  Prior chest radiographs 10/08/2021 and earlier. CT chest/abdomen/pelvis 11/01/2021. FINDINGS: Mild prominence of the cardiopericardial silhouette, similar to the prior chest radiographs of 10/08/2021. Emphysema, better delineated on the prior chest CT of 11/01/2021. Subtle ill-defined opacity within the left lung base head no appreciable airspace consolidation on the right. No evidence of pleural effusion or pneumothorax. No acute bony abnormality identified. Focally expansile appearance of the lateral left seventh rib, similar to the prior chest CT and possibly reflecting  chronic sequela of a prior fracture. IMPRESSION: Subtle ill-defined opacity within the left lung base. The appearance favors atelectasis. However, correlate clinically to exclude signs/symptoms that would suggest early pneumonia. Mild prominence of the cardiopericardial silhouette, similar to the prior chest radiographs of 10/08/2021. Of note, a small pericardial effusion was present on the prior chest CT of 11/01/2021. Emphysema (ICD10-J43.9). Electronically Signed   By: Kellie Simmering D.O.   On: 02/18/2022 15:58     Discharge Instructions: Discharge Instructions     Call MD for:  difficulty breathing, headache or visual disturbances   Complete by: As directed    Call MD for:  persistant dizziness or light-headedness   Complete by: As directed    Call MD for:  temperature >100.4   Complete by: As directed    Diet - low sodium heart healthy   Complete by: As directed    Diet - low sodium heart healthy   Complete by: As directed    Increase activity slowly   Complete by: As directed    Increase activity slowly   Complete by: As directed       Ralph Leyden,  It was a pleasure taking care of you at [place] were admitted for pneumonia, and treated with antibiotics.  We are discharging you home now that you are doing better. Please follow the following instructions.   1) Regarding your pneumonia, I am sending you home on antibiotics.  Your antibiotic name is Augmentin and the other 1 is azithromycin.  Please take azithromycin for the next 2 days.  Please take Augmentin for the next 4 days.  Please continue taking your inhalers as instructed.  2) Regarding your hospital follow-up, please show up to your appointment at the internal medicine center.  Your appointment is at 2:15 PM on January 16.  Your appointment  will be with Dr. Allyson Sabal.  3) Please speak with your outpatient physician, to see if we can get you set up for portable oxygen at home.  4) Please wear your  oxygen to ensure your oxygen stays between 88 to 92%.  5) There were imaging findings, concerning for lymphadenopathy.  Please follow-up with outpatient physician for repeat chest x-ray.  Take care,  Dr. Leigh Aurora, DO   Signed: Leigh Aurora, DO 02/19/2022, 2:24 PM   Pager: [phone]

## 2022-02-19 NOTE — ED Notes (Signed)
Pt ambulated with cane to rest room, even short strides, steady gait, Spo2 90%, HR 97. No dizziness,

## 2022-02-19 NOTE — ED Notes (Signed)
ED TO INPATIENT HANDOFF REPORT  ED Nurse Name and Phone #: Carolin Coy [phone]  S Name/Age/Gender Dustin Alvarez 67 y.o. male Room/Bed: 037C/037C  Code Status   Code Status: Full Code  Home/SNF/Other Home Patient oriented to: self, place, time, and situation Is this baseline? Yes   Triage Complete: Triage complete  Chief Complaint Acute encephalopathy [G93.40]  Triage Note Pt bib gcems for intermittent aloc starting today. Pts son reports pt is having episodes of confusion and drowsiness. Hx of COPD - 3L [place] at baseline but has not had any oxygen since Friday. EMS reports pt was initially alert and oriented but became unresponsive after starting duoneb. Reports of new tremors starting today. '125mg'$  solumedrol and 2 duonebs given pta.      Allergies Allergies  Allergen Reactions   Ace Inhibitors Swelling    lisinopril   Lisinopril Swelling    Level of Care/Admitting Diagnosis ED Disposition     ED Disposition  Admit   Condition  --   Comment  Hospital Area: [facility] [[id number]]  Level of Care: Telemetry Medical [104]  May place patient in observation at [facility] or [facility] if equivalent level of care is available:: No  Covid Evaluation: Confirmed COVID Negative  Diagnosis: Acute encephalopathy [[id number]]  Admitting Physician: Lottie Mussel [[id number]]  Attending Physician: Lottie Mussel [[id number]]          B Medical/Surgery History Past Medical History:  Diagnosis Date   Asthma    COPD (chronic obstructive pulmonary disease) ([place])    GERD (gastroesophageal reflux disease)    Hypertension    Prostate cancer [facility])    Past Surgical History:  Procedure Laterality Date   ABDOMINAL AORTOGRAM W/LOWER EXTREMITY N/A 10/26/2020   Procedure: ABDOMINAL AORTOGRAM W/LOWER EXTREMITY;  Surgeon: Marty Heck, MD;  Location: [facility];  Service: Cardiovascular;  Laterality: N/A;   COLONOSCOPY WITH PROPOFOL N/A 05/22/2021    Procedure: COLONOSCOPY WITH PROPOFOL;  Surgeon: Doran Stabler, MD;  Location: [facility];  Service: Gastroenterology;  Laterality: N/A;   ESOPHAGOGASTRODUODENOSCOPY (EGD) WITH PROPOFOL N/A 05/22/2021   Procedure: ESOPHAGOGASTRODUODENOSCOPY (EGD) WITH PROPOFOL;  Surgeon: Doran Stabler, MD;  Location: [facility];  Service: Gastroenterology;  Laterality: N/A;   HEMOSTASIS CLIP PLACEMENT  05/22/2021   Procedure: HEMOSTASIS CLIP PLACEMENT;  Surgeon: Doran Stabler, MD;  Location: [facility];  Service: Gastroenterology;;   HOT HEMOSTASIS N/A 05/22/2021   Procedure: HOT HEMOSTASIS (ARGON PLASMA COAGULATION/BICAP);  Surgeon: Doran Stabler, MD;  Location: [facility];  Service: Gastroenterology;  Laterality: N/A;   WRIST SURGERY Left 2017     A IV Location/Drains/Wounds Patient Lines/Drains/Airways Status     Active Line/Drains/Airways     Name Placement date Placement time Site Days   Peripheral IV 02/18/22 20 G Right Forearm 02/18/22  --  Forearm  1   Peripheral IV 02/18/22 20 G Anterior;Left Forearm 02/18/22  1529  Forearm  1   External Urinary Catheter 02/18/22  1700  --  1            Intake/Output Last 24 hours  Intake/Output Summary (Last 24 hours) at 02/19/2022 1258 Last data filed at 02/19/2022 1115 Gross per 24 hour  Intake 2700 ml  Output --  Net 2700 ml    Labs/Imaging Results for orders placed or performed during the hospital encounter of 02/18/22 (from the past 48 hour(s))  Culture, blood (routine x 2)     Status: None (Preliminary  result)   Collection Time: 02/18/22  3:29 PM   Specimen: BLOOD RIGHT FOREARM  Result Value Ref Range   Specimen Description BLOOD RIGHT FOREARM    Special Requests      BOTTLES DRAWN AEROBIC AND ANAEROBIC Blood Culture adequate volume   Culture      NO GROWTH < 24 HOURS Performed at [facility], [place] [address], [place], [place] [address]    Report Status PENDING   Comprehensive metabolic panel     Status:  Abnormal   Collection Time: 02/18/22  3:30 PM  Result Value Ref Range   Sodium 137 135 - 145 mmol/L   Potassium 4.1 3.5 - 5.1 mmol/L   Chloride 97 (L) 98 - 111 mmol/L   CO2 30 22 - 32 mmol/L   Glucose, Bld 126 (H) 70 - 99 mg/dL    Comment: Glucose reference range applies only to samples taken after fasting for at least 8 hours.   BUN 21 8 - 23 mg/dL   Creatinine, Ser 1.34 (H) 0.61 - 1.24 mg/dL   Calcium 8.2 (L) 8.9 - 10.3 mg/dL   Total Protein 7.8 6.5 - 8.1 g/dL   Albumin 3.6 3.5 - 5.0 g/dL   AST 17 15 - 41 U/L   ALT 11 0 - 44 U/L   Alkaline Phosphatase 84 38 - 126 U/L   Total Bilirubin 0.7 0.3 - 1.2 mg/dL   GFR, Estimated 58 (L) >60 mL/min    Comment: (NOTE) Calculated using the CKD-EPI Creatinine Equation (2021)    Anion gap 10 5 - 15    Comment: Performed at [facility] [address] [place], [place] [address]  Brain natriuretic peptide     Status: Abnormal   Collection Time: 02/18/22  3:30 PM  Result Value Ref Range   B Natriuretic Peptide 1,537.2 (H) 0.0 - 100.0 pg/mL    Comment: Performed at [facility] [address] [place], [place] [address]  Troponin I (High Sensitivity)     Status: None   Collection Time: 02/18/22  3:30 PM  Result Value Ref Range   Troponin I (High Sensitivity) 16 <18 ng/L    Comment: (NOTE) Elevated high sensitivity troponin I (hsTnI) values and significant  changes across serial measurements may suggest ACS but many other  chronic and acute conditions are known to elevate hsTnI results.  Refer to the "Links" section for chest pain algorithms and additional  guidance. Performed at [facility], [place] [address], [place], [place] [address]   CBC with Differential     Status: Abnormal   Collection Time: 02/18/22  3:30 PM  Result Value Ref Range   WBC 5.3 4.0 - 10.5 K/uL   RBC 4.41 4.22 - 5.81 MIL/uL   Hemoglobin 10.6 (L) 13.0 - 17.0 g/dL   HCT 33.5 (L) 39.0 - 52.0 %   MCV 76.0 (L) 80.0 - 100.0 fL   MCH 24.0 (L) 26.0 -  34.0 pg   MCHC 31.6 30.0 - 36.0 g/dL   RDW 18.6 (H) 11.5 - 15.5 %   Platelets 219 150 - 400 K/uL   nRBC 0.0 0.0 - 0.2 %   Neutrophils Relative % 75 %   Neutro Abs 3.9 1.7 - 7.7 K/uL   Lymphocytes Relative 14 %   Lymphs Abs 0.8 0.7 - 4.0 K/uL   Monocytes Relative 11 %   Monocytes Absolute 0.6 0.1 - 1.0 K/uL   Eosinophils Relative 0 %  Eosinophils Absolute 0.0 0.0 - 0.5 K/uL   Basophils Relative 0 %   Basophils Absolute 0.0 0.0 - 0.1 K/uL   Immature Granulocytes 0 %   Abs Immature Granulocytes 0.02 0.00 - 0.07 K/uL    Comment: Performed at [facility], [place] [address] [place], [place] [address]  Resp panel by RT-PCR (RSV, Flu A&B, Covid) Anterior Nasal Swab     Status: None   Collection Time: 02/18/22  3:33 PM   Specimen: Anterior Nasal Swab  Result Value Ref Range   SARS Coronavirus 2 by RT PCR NEGATIVE NEGATIVE    Comment: (NOTE) SARS-CoV-2 target nucleic acids are NOT DETECTED.  The SARS-CoV-2 RNA is generally detectable in upper respiratory specimens during the acute phase of infection. The lowest concentration of SARS-CoV-2 viral copies this assay can detect is 138 copies/mL. A negative result does not preclude SARS-Cov-2 infection and should not be used as the sole basis for treatment or other patient management decisions. A negative result may occur with  improper specimen collection/handling, submission of specimen other than nasopharyngeal swab, presence of viral mutation(s) within the areas targeted by this assay, and inadequate number of viral copies(<138 copies/mL). A negative result must be combined with clinical observations, patient history, and epidemiological information. The expected result is Negative.  Fact Sheet for Patients:  EntrepreneurPulse.com.au  Fact Sheet for Healthcare Providers:  IncredibleEmployment.be  This test is no t yet approved or cleared by the [place] FDA and  has been authorized for  detection and/or diagnosis of SARS-CoV-2 by FDA under an Emergency Use Authorization (EUA). This EUA will remain  in effect (meaning this test can be used) for the duration of the COVID-19 declaration under Section 564(b)(1) of the Act, 21 U.S.C.section 360bbb-3(b)(1), unless the authorization is terminated  or revoked sooner.       Influenza A by PCR NEGATIVE NEGATIVE   Influenza B by PCR NEGATIVE NEGATIVE    Comment: (NOTE) The Xpert Xpress SARS-CoV-2/FLU/RSV plus assay is intended as an aid in the diagnosis of influenza from Nasopharyngeal swab specimens and should not be used as a sole basis for treatment. Nasal washings and aspirates are unacceptable for Xpert Xpress SARS-CoV-2/FLU/RSV testing.  Fact Sheet for Patients: EntrepreneurPulse.com.au  Fact Sheet for Healthcare Providers: IncredibleEmployment.be  This test is not yet approved or cleared by the [place] FDA and has been authorized for detection and/or diagnosis of SARS-CoV-2 by FDA under an Emergency Use Authorization (EUA). This EUA will remain in effect (meaning this test can be used) for the duration of the COVID-19 declaration under Section 564(b)(1) of the Act, 21 U.S.C. section 360bbb-3(b)(1), unless the authorization is terminated or revoked.     Resp Syncytial Virus by PCR NEGATIVE NEGATIVE    Comment: (NOTE) Fact Sheet for Patients: EntrepreneurPulse.com.au  Fact Sheet for Healthcare Providers: IncredibleEmployment.be  This test is not yet approved or cleared by the [place] FDA and has been authorized for detection and/or diagnosis of SARS-CoV-2 by FDA under an Emergency Use Authorization (EUA). This EUA will remain in effect (meaning this test can be used) for the duration of the COVID-19 declaration under Section 564(b)(1) of the Act, 21 U.S.C. section 360bbb-3(b)(1), unless the authorization is terminated  or revoked.  Performed at [facility], [place] [address] [place], [place] [address]   I-stat chem 8, ED     Status: Abnormal   Collection Time: 02/18/22  3:38 PM  Result Value Ref Range   Sodium 139 135 -  145 mmol/L   Potassium 4.2 3.5 - 5.1 mmol/L   Chloride 96 (L) 98 - 111 mmol/L   BUN 24 (H) 8 - 23 mg/dL   Creatinine, Ser 1.40 (H) 0.61 - 1.24 mg/dL   Glucose, Bld 128 (H) 70 - 99 mg/dL    Comment: Glucose reference range applies only to samples taken after fasting for at least 8 hours.   Calcium, Ion 1.04 (L) 1.15 - 1.40 mmol/L   TCO2 30 22 - 32 mmol/L   Hemoglobin 12.9 (L) 13.0 - 17.0 g/dL   HCT 38.0 (L) 39.0 - 52.0 %  I-Stat venous blood gas, ED     Status: Abnormal   Collection Time: 02/18/22  3:38 PM  Result Value Ref Range   pH, Ven 7.326 7.25 - 7.43   pCO2, Ven 60.5 (H) 44 - 60 mmHg   pO2, Ven 31 (LL) 32 - 45 mmHg   Bicarbonate 31.6 (H) 20.0 - 28.0 mmol/L   TCO2 33 (H) 22 - 32 mmol/L   O2 Saturation 53 %   Acid-Base Excess 4.0 (H) 0.0 - 2.0 mmol/L   Sodium 139 135 - 145 mmol/L   Potassium 4.2 3.5 - 5.1 mmol/L   Calcium, Ion 1.05 (L) 1.15 - 1.40 mmol/L   HCT 38.0 (L) 39.0 - 52.0 %   Hemoglobin 12.9 (L) 13.0 - 17.0 g/dL   Sample type VENOUS    Comment NOTIFIED PHYSICIAN   Culture, blood (routine x 2)     Status: None (Preliminary result)   Collection Time: 02/18/22  3:45 PM   Specimen: BLOOD LEFT HAND  Result Value Ref Range   Specimen Description BLOOD LEFT HAND    Special Requests      BOTTLES DRAWN AEROBIC AND ANAEROBIC Blood Culture results may not be optimal due to an inadequate volume of blood received in culture bottles   Culture      NO GROWTH < 24 HOURS Performed at [facility], [address] [place], [place] [address]    Report Status PENDING   Troponin I (High Sensitivity)     Status: Abnormal   Collection Time: 02/18/22  5:33 PM  Result Value Ref Range   Troponin I (High Sensitivity) 18 (H) <18 ng/L    Comment: (NOTE) Elevated high  sensitivity troponin I (hsTnI) values and significant  changes across serial measurements may suggest ACS but many other  chronic and acute conditions are known to elevate hsTnI results.  Refer to the "Links" section for chest pain algorithms and additional  guidance. Performed at [facility], [place] [address], [place], [place] [address]   Lactic acid, plasma     Status: None   Collection Time: 02/18/22  5:33 PM  Result Value Ref Range   Lactic Acid, Venous 1.4 0.5 - 1.9 mmol/L    Comment: Performed at [facility] [address], [place], [place] [address]  HIV Antibody (routine testing w rflx)     Status: None   Collection Time: 02/18/22  5:33 PM  Result Value Ref Range   HIV Screen 4th Generation wRfx Non Reactive Non Reactive    Comment: Performed at [facility], [place] [address], [place], [place] [address]  Procalcitonin - Baseline     Status: None   Collection Time: 02/18/22  5:33 PM  Result Value Ref Range   Procalcitonin 0.47 ng/mL    Comment:        Interpretation: PCT (Procalcitonin) <= 0.5 ng/mL:  Systemic infection (sepsis) is not likely. Local bacterial infection is possible. (NOTE)       Sepsis PCT Algorithm           Lower Respiratory Tract                                      Infection PCT Algorithm    ----------------------------     ----------------------------         PCT < 0.25 ng/mL                PCT < 0.10 ng/mL          Strongly encourage             Strongly discourage   discontinuation of antibiotics    initiation of antibiotics    ----------------------------     -----------------------------       PCT 0.25 - 0.50 ng/mL            PCT 0.10 - 0.25 ng/mL               OR       >80% decrease in PCT            Discourage initiation of                                            antibiotics      Encourage discontinuation           of antibiotics    ----------------------------     -----------------------------         PCT >= 0.50 ng/mL               PCT 0.26 - 0.50 ng/mL               AND        <80% decrease in PCT             Encourage initiation of                                             antibiotics       Encourage continuation           of antibiotics    ----------------------------     -----------------------------        PCT >= 0.50 ng/mL                  PCT > 0.50 ng/mL               AND         increase in PCT                  Strongly encourage                                      initiation of antibiotics    Strongly encourage escalation           of antibiotics                                     -----------------------------  PCT <= 0.25 ng/mL                                                 OR                                        > 80% decrease in PCT                                      Discontinue / Do not initiate                                             antibiotics  Performed at [facility], [place] [address], [place], [place] [address]   I-Stat venous blood gas, [facility], [facility], [facility])     Status: Abnormal   Collection Time: 02/18/22  7:23 PM  Result Value Ref Range   pH, Ven 7.292 7.25 - 7.43   pCO2, Ven 59.4 44 - 60 mmHg   pO2, Ven 118 (H) 32 - 45 mmHg   Bicarbonate 28.7 (H) 20.0 - 28.0 mmol/L   TCO2 30 22 - 32 mmol/L   O2 Saturation 98 %   Acid-Base Excess 1.0 0.0 - 2.0 mmol/L   Sodium 138 135 - 145 mmol/L   Potassium 3.8 3.5 - 5.1 mmol/L   Calcium, Ion 1.02 (L) 1.15 - 1.40 mmol/L   HCT 34.0 (L) 39.0 - 52.0 %   Hemoglobin 11.6 (L) 13.0 - 17.0 g/dL   Sample type VENOUS   MRSA Next Gen by PCR, Nasal     Status: None   Collection Time: 02/18/22  9:55 PM   Specimen: Nasal Mucosa; Nasal Swab  Result Value Ref Range   MRSA by PCR Next Gen NOT DETECTED NOT DETECTED    Comment: (NOTE) The GeneXpert MRSA Assay (FDA approved for NASAL specimens only), is one component of a comprehensive MRSA colonization surveillance program. It is not intended  to diagnose MRSA infection nor to guide or monitor treatment for MRSA infections. Test performance is not FDA approved in patients less than 23 years old. Performed at [facility], [place] [address] [place], [place] [address]   CBC     Status: Abnormal   Collection Time: 02/19/22  4:45 AM  Result Value Ref Range   WBC 4.4 4.0 - 10.5 K/uL   RBC 4.18 (L) 4.22 - 5.81 MIL/uL   Hemoglobin 10.1 (L) 13.0 - 17.0 g/dL   HCT 31.7 (L) 39.0 - 52.0 %   MCV 75.8 (L) 80.0 - 100.0 fL   MCH 24.2 (L) 26.0 - 34.0 pg   MCHC 31.9 30.0 - 36.0 g/dL   RDW 19.0 (H) 11.5 - 15.5 %   Platelets 203 150 - 400 K/uL   nRBC 0.0 0.0 - 0.2 %    Comment: Performed at [facility], [place] [address], [place], [place] [address]  Basic metabolic panel     Status: Abnormal   Collection Time: 02/19/22  4:45 AM  Result Value Ref Range   Sodium  136 135 - 145 mmol/L   Potassium 4.0 3.5 - 5.1 mmol/L   Chloride 97 (L) 98 - 111 mmol/L   CO2 29 22 - 32 mmol/L   Glucose, Bld 155 (H) 70 - 99 mg/dL    Comment: Glucose reference range applies only to samples taken after fasting for at least 8 hours.   BUN 19 8 - 23 mg/dL   Creatinine, Ser 1.03 0.61 - 1.24 mg/dL   Calcium 7.8 (L) 8.9 - 10.3 mg/dL   GFR, Estimated >60 >60 mL/min    Comment: (NOTE) Calculated using the CKD-EPI Creatinine Equation (2021)    Anion gap 10 5 - 15    Comment: Performed at [facility] [address] [place], [place] [address]  CBG monitoring, ED     Status: Abnormal   Collection Time: 02/19/22  9:05 AM  Result Value Ref Range   Glucose-Capillary 154 (H) 70 - 99 mg/dL    Comment: Glucose reference range applies only to samples taken after fasting for at least 8 hours.   CT Angio Chest PE W and/or Wo Contrast  Result Date: 02/18/2022 CLINICAL DATA:  Confusion and drowsiness. High probability for PE. History of prostate cancer. EXAM: CT ANGIOGRAPHY CHEST WITH CONTRAST TECHNIQUE: Multidetector CT imaging of the chest was performed  using the standard protocol during bolus administration of intravenous contrast. Multiplanar CT image reconstructions and MIPs were obtained to evaluate the vascular anatomy. RADIATION DOSE REDUCTION: This exam was performed according to the departmental dose-optimization program which includes automated exposure control, adjustment of the mA and/or kV according to patient size and/or use of iterative reconstruction technique. CONTRAST:  26m OMNIPAQUE IOHEXOL 350 MG/ML SOLN COMPARISON:  CT chest abdomen and pelvis 11/01/2021 FINDINGS: Cardiovascular: There is a moderate pericardial effusion which has slightly increased in size. The heart is mildly enlarged, unchanged. Aorta is normal in size. There is adequate opacification of the pulmonary arteries to the segmental level. There is no evidence for pulmonary embolism. Mediastinum/Nodes: There is an enlarged right hilar lymph node measuring 16 mm short axis, new from prior. Are prominent, but nonenlarged left hilar lymph nodes. Visualized esophagus and thyroid gland are within normal limits. Lungs/Pleura: Moderate severe emphysematous changes are similar to the prior study. There is scarring in the right lung apex, unchanged. There is diffuse peribronchial wall thickening most significant in the bilateral lower lobes where there is some mucous plugging. There are minimal patchy airspace opacities in the lower lobes, left greater than right. No pneumothorax or pleural effusion. Upper Abdomen: No acute abnormality. Musculoskeletal: There is a healed left seventh rib fracture, similar to prior. T10 vertebral body mixed lucent and sclerotic lesion measuring 17 mm is unchanged. Review of the MIP images confirms the above findings. IMPRESSION: 1. No evidence for pulmonary embolism. 2. Moderate pericardial effusion has slightly increased in size. 3. Stable cardiomegaly. 4. Diffuse peribronchial wall thickening with mucous plugging in the lower lobes. There are minimal  patchy airspace opacities in the lower lobes, left greater than right. Findings may be related to infection or aspiration. 5. New right hilar lymphadenopathy. 6. Stable T10 vertebral body lesion. Electronically Signed   By: ARonney AstersM.D.   On: 02/18/2022 18:16   CT Head Wo Contrast  Result Date: 02/18/2022 CLINICAL DATA:  Altered mental status. EXAM: CT HEAD WITHOUT CONTRAST TECHNIQUE: Contiguous axial images were obtained from the base of the skull through the vertex without intravenous contrast. RADIATION DOSE REDUCTION: This exam was performed according to  the departmental dose-optimization program which includes automated exposure control, adjustment of the mA and/or kV according to patient size and/or use of iterative reconstruction technique. COMPARISON:  None Available. FINDINGS: Brain: No evidence of acute infarction, hemorrhage, hydrocephalus, extra-axial collection or mass lesion/mass effect. Vascular: Calcified atherosclerosis at the skull base. No hyperdense vessel. Skull: Normal. Negative for fracture or focal lesion. Sinuses/Orbits: No acute finding. Other: None. IMPRESSION: 1. No acute intracranial abnormality. Electronically Signed   By: Titus Dubin M.D.   On: 02/18/2022 16:45   DG Chest Port 1 View  Result Date: 02/18/2022 CLINICAL DATA:  Provided history: Shortness of breath. History of COPD, on oxygen via nasal cannula at baseline. EXAM: PORTABLE CHEST 1 VIEW COMPARISON:  Prior chest radiographs 10/08/2021 and earlier. CT chest/abdomen/pelvis 11/01/2021. FINDINGS: Mild prominence of the cardiopericardial silhouette, similar to the prior chest radiographs of 10/08/2021. Emphysema, better delineated on the prior chest CT of 11/01/2021. Subtle ill-defined opacity within the left lung base head no appreciable airspace consolidation on the right. No evidence of pleural effusion or pneumothorax. No acute bony abnormality identified. Focally expansile appearance of the lateral left seventh  rib, similar to the prior chest CT and possibly reflecting chronic sequela of a prior fracture. IMPRESSION: Subtle ill-defined opacity within the left lung base. The appearance favors atelectasis. However, correlate clinically to exclude signs/symptoms that would suggest early pneumonia. Mild prominence of the cardiopericardial silhouette, similar to the prior chest radiographs of 10/08/2021. Of note, a small pericardial effusion was present on the prior chest CT of 11/01/2021. Emphysema (ICD10-J43.9). Electronically Signed   By: Kellie Simmering D.O.   On: 02/18/2022 15:58    Pending Labs Unresulted Labs (From admission, onward)     Start     Ordered   02/20/22 0500  CBC  Tomorrow morning,   R        02/19/22 1156   02/20/22 2130  Basic metabolic panel  Tomorrow morning,   R        02/19/22 1156   02/18/22 2156  Rapid urine drug screen (hospital performed)  ONCE - STAT,   STAT        02/18/22 2155   02/18/22 2155  Expectorated Sputum Assessment w Gram Stain, Rflx to Resp Cult  Once,   R        02/18/22 2154   02/18/22 2154  Strep pneumoniae urinary antigen  Once,   R        02/18/22 2154   02/18/22 1536  Urinalysis, Routine w reflex microscopic  Once,   URGENT        02/18/22 1535            Vitals/Pain Today's Vitals   02/19/22 0100 02/19/22 0500 02/19/22 1030 02/19/22 1143  BP: 118/75 128/81 134/84   Pulse: 87 84 95 97  Resp: '19 19 15   '$ Temp: 98.5 F (36.9 C) 98.2 F (36.8 C) 98.5 F (36.9 C)   TempSrc: Oral Oral Oral   SpO2: 100% 99% 95% 90%  Weight:      Height:      PainSc:        Isolation Precautions No active isolations  Medications Medications  finasteride (PROSCAR) tablet 5 mg (5 mg Oral Given 02/19/22 1020)  tamsulosin (FLOMAX) capsule 0.4 mg (0.4 mg Oral Given 02/19/22 1020)  abiraterone acetate (ZYTIGA) tablet 1,000 mg (1,000 mg Oral Not Given 02/19/22 1222)  fluticasone furoate-vilanterol (BREO ELLIPTA) 200-25 MCG/ACT 1 puff (1 puff Inhalation Given 02/19/22 1152)  enoxaparin (LOVENOX) injection 40 mg (40 mg Subcutaneous Given 02/19/22 0832)  acetaminophen (TYLENOL) tablet 650 mg (has no administration in time range)    Or  acetaminophen (TYLENOL) suppository 650 mg (has no administration in time range)  ipratropium-albuterol (DUONEB) 0.5-2.5 (3) MG/3ML nebulizer solution 3 mL (3 mLs Nebulization Given 02/19/22 1228)  Ampicillin-Sulbactam (UNASYN) 3 g in sodium chloride 0.9 % 100 mL IVPB (0 g Intravenous Stopped 02/19/22 1115)  nicotine (NICODERM CQ - dosed in mg/24 hr) patch 7 mg (7 mg Transdermal Patch Applied 02/19/22 1151)  ferrous sulfate tablet 325 mg (325 mg Oral Given 02/19/22 0832)  predniSONE (DELTASONE) tablet 40 mg (40 mg Oral Given 02/19/22 0027)  azithromycin (ZITHROMAX) tablet 250 mg (has no administration in time range)  lactated ringers bolus 1,000 mL (0 mLs Intravenous Stopped 02/18/22 1648)  magnesium sulfate IVPB 2 g 50 mL (0 g Intravenous Stopped 02/18/22 1649)  ipratropium (ATROVENT) 0.02 % nebulizer solution (1 mg  Given 02/18/22 1535)  albuterol (PROVENTIL) (2.5 MG/3ML) 0.083% nebulizer solution (10 mg  Given 02/18/22 1535)  cefTRIAXone (ROCEPHIN) 1 g in sodium chloride 0.9 % 100 mL IVPB (0 g Intravenous Stopped 02/18/22 1720)  azithromycin (ZITHROMAX) 500 mg in sodium chloride 0.9 % 250 mL IVPB (0 mg Intravenous Stopped 02/18/22 1800)  lactated ringers bolus 1,000 mL (0 mLs Intravenous Stopped 02/18/22 1800)  iohexol (OMNIPAQUE) 350 MG/ML injection 75 mL (75 mLs Intravenous Contrast Given 02/18/22 1749)    Mobility walks with device High fall risk   Focused Assessments Pulmonary Assessment Handoff:  Lung sounds: Bilateral Breath Sounds: Expiratory wheezes, Coarse crackles L Breath Sounds: Diminished R Breath Sounds: Diminished O2 Device: (S) Nasal Cannula O2 Flow Rate (L/min): 2 L/min    R Recommendations: See Admitting Provider Note  Report given to:   Additional Notes: Pt is Aox4, ambulated with cane (patients personal), here because he  ran out of O2 at home, on 3L [place] at baseline, and developed pneumonia. Male cath in place. Family is updated.

## 2022-02-19 NOTE — Discharge Instructions (Addendum)
Dustin Alvarez,  It was a pleasure taking care of you at [facility] were admitted for pneumonia, and treated with antibiotics.  We are discharging you home now that you are doing better. Please follow the following instructions.   1) Regarding your pneumonia, I am sending you home on antibiotics.  Your antibiotic name is Augmentin and the other 1 is azithromycin.  Please take azithromycin for the next 2 days.  Please take Augmentin for the next 4 days.  Please continue taking your inhalers as instructed.  2) Regarding your hospital follow-up, please show up to your appointment at the internal medicine center.  Your appointment is at 2:15 PM on January 16.  Your appointment will be with Dr. Allyson Sabal.  3) Please speak with your outpatient physician, to see if we can get you set up for portable oxygen at home.  4) Please wear your oxygen to ensure your oxygen stays between 88 to 92%.  5) There were imaging findings, concerning for lymphadenopathy.  Please follow-up with outpatient physician for repeat chest x-ray.  Take care,  Dr. Leigh Aurora, DO

## 2022-02-20 ENCOUNTER — Encounter: Payer: Self-pay | Admitting: Hematology and Oncology

## 2022-02-20 ENCOUNTER — Other Ambulatory Visit (HOSPITAL_COMMUNITY): Payer: Self-pay

## 2022-02-20 ENCOUNTER — Encounter: Payer: Medicare Other | Admitting: Internal Medicine

## 2022-02-20 ENCOUNTER — Telehealth: Payer: Self-pay

## 2022-02-20 NOTE — Patient Outreach (Signed)
  Care Coordination TOC Note Transition Care Management Follow-up Telephone Call Date of discharge and from where: [facility] How have you been since you were released from the hospital? Per patient's daughter, he is doing okay and is feeling a little bit better. Any questions or concerns? No  Items Reviewed: Did the pt receive and understand the discharge instructions provided? Yes  Medications obtained and verified? No -Patient was discharged late yesterday and the pharmacy was closed.  Daughter on her way to pick up. Other? No  Any new allergies since your discharge? No  Dietary orders reviewed? No Do you have support at home? Yes   Home Care and Equipment/Supplies: Were home health services ordered? not applicable If so, what is the name of the agency? N/a  Has the agency set up a time to come to the patient's home? not applicable Were any new equipment or medical supplies ordered?  No What is the name of the medical supply agency? N/a Were you able to get the supplies/equipment? not applicable Do you have any questions related to the use of the equipment or supplies? No  Functional Questionnaire: (I = Independent and D = Dependent) ADLs: Needs assistance  Bathing/Dressing- I  Meal Prep- I  Eating- I  Maintaining continence- I  Transferring/Ambulation- supervision  Managing Meds- needs assistance  Follow up appointments reviewed:  PCP Hospital f/u appt confirmed? Yes  Scheduled to see Dr. Allyson Sabal on 02/26/22 @ 02:15. [facility] f/u appt confirmed? No   Are transportation arrangements needed? No  If their condition worsens, is the pt aware to call PCP or go to the Emergency Dept.? Yes Was the patient provided with contact information for the PCP's office or ED? Yes Was to pt encouraged to call back with questions or concerns? Yes  SDOH assessments and interventions completed:   Yes SDOH Interventions Today    Flowsheet Row Most Recent Value  SDOH  Interventions   Food Insecurity Interventions Intervention Not Indicated  Housing Interventions Intervention Not Indicated       Care Coordination Interventions:  No Care Coordination interventions needed at this time.   Encounter Outcome:  Pt. Visit Completed

## 2022-02-21 ENCOUNTER — Ambulatory Visit (HOSPITAL_COMMUNITY): Payer: Medicare Other

## 2022-02-23 LAB — CULTURE, BLOOD (ROUTINE X 2)
Culture: NO GROWTH
Culture: NO GROWTH
Special Requests: ADEQUATE

## 2022-02-26 ENCOUNTER — Ambulatory Visit (HOSPITAL_COMMUNITY): Payer: Medicare Other | Admitting: Cardiology

## 2022-02-26 ENCOUNTER — Ambulatory Visit (HOSPITAL_COMMUNITY): Payer: Medicare Other

## 2022-02-26 ENCOUNTER — Ambulatory Visit: Payer: Medicare Other | Admitting: Adult Health

## 2022-02-26 ENCOUNTER — Encounter: Payer: Self-pay | Admitting: Student

## 2022-02-28 ENCOUNTER — Ambulatory Visit (HOSPITAL_COMMUNITY): Payer: Medicare Other

## 2022-02-28 ENCOUNTER — Other Ambulatory Visit: Payer: Self-pay | Admitting: Podiatry

## 2022-02-28 DIAGNOSIS — L97521 Non-pressure chronic ulcer of other part of left foot limited to breakdown of skin: Secondary | ICD-10-CM

## 2022-03-01 ENCOUNTER — Encounter: Payer: Self-pay | Admitting: Hematology and Oncology

## 2022-03-05 ENCOUNTER — Other Ambulatory Visit (HOSPITAL_COMMUNITY): Payer: Self-pay

## 2022-03-05 ENCOUNTER — Ambulatory Visit (HOSPITAL_COMMUNITY): Payer: Medicare Other

## 2022-03-06 ENCOUNTER — Other Ambulatory Visit: Payer: Self-pay

## 2022-03-06 ENCOUNTER — Encounter: Payer: Self-pay | Admitting: Hematology and Oncology

## 2022-03-06 ENCOUNTER — Other Ambulatory Visit (HOSPITAL_COMMUNITY): Payer: Self-pay

## 2022-03-07 ENCOUNTER — Ambulatory Visit (HOSPITAL_COMMUNITY): Payer: Medicare Other

## 2022-03-08 ENCOUNTER — Telehealth: Payer: Self-pay

## 2022-03-08 ENCOUNTER — Ambulatory Visit (INDEPENDENT_AMBULATORY_CARE_PROVIDER_SITE_OTHER): Payer: Medicare (Managed Care) | Admitting: Podiatry

## 2022-03-08 DIAGNOSIS — Z91199 Patient's noncompliance with other medical treatment and regimen due to unspecified reason: Secondary | ICD-10-CM

## 2022-03-08 NOTE — Progress Notes (Signed)
1. No-show for appointment

## 2022-03-08 NOTE — Telephone Encounter (Signed)
Pt daughter [name] called to report the pt passing. Pt was found by daughter in the home, unsure as to the date or time of death, pt case seen by medical examiner.

## 2022-03-12 ENCOUNTER — Ambulatory Visit (HOSPITAL_COMMUNITY): Payer: Medicare Other

## 2022-03-14 ENCOUNTER — Ambulatory Visit (HOSPITAL_COMMUNITY): Payer: Medicare Other

## 2022-03-14 DEATH — deceased

## 2022-03-19 ENCOUNTER — Ambulatory Visit (HOSPITAL_COMMUNITY): Payer: Medicare Other

## 2022-03-19 ENCOUNTER — Other Ambulatory Visit (HOSPITAL_COMMUNITY): Payer: Self-pay

## 2022-03-21 ENCOUNTER — Ambulatory Visit (HOSPITAL_COMMUNITY): Payer: Medicare Other

## 2022-03-26 ENCOUNTER — Ambulatory Visit (HOSPITAL_COMMUNITY): Payer: Medicare Other

## 2022-03-28 ENCOUNTER — Other Ambulatory Visit (HOSPITAL_COMMUNITY): Payer: Self-pay

## 2022-03-28 ENCOUNTER — Ambulatory Visit (HOSPITAL_COMMUNITY): Payer: Medicare Other

## 2022-04-01 ENCOUNTER — Ambulatory Visit (HOSPITAL_BASED_OUTPATIENT_CLINIC_OR_DEPARTMENT_OTHER): Payer: Medicare Other | Admitting: Pulmonary Disease

## 2022-04-02 ENCOUNTER — Encounter (HOSPITAL_COMMUNITY): Payer: Medicare Other

## 2022-04-02 ENCOUNTER — Ambulatory Visit (HOSPITAL_COMMUNITY): Payer: Medicare Other

## 2022-04-02 ENCOUNTER — Ambulatory Visit: Payer: Medicare Other | Admitting: Vascular Surgery

## 2022-04-04 ENCOUNTER — Ambulatory Visit (HOSPITAL_COMMUNITY): Payer: Medicare Other

## 2022-04-09 ENCOUNTER — Ambulatory Visit (HOSPITAL_COMMUNITY): Payer: Medicare Other

## 2022-04-11 ENCOUNTER — Ambulatory Visit (HOSPITAL_COMMUNITY): Payer: Medicare Other

## 2022-04-18 ENCOUNTER — Ambulatory Visit: Payer: Medicare Other

## 2022-05-30 ENCOUNTER — Other Ambulatory Visit (HOSPITAL_COMMUNITY): Payer: Self-pay

## 2022-07-19 ENCOUNTER — Ambulatory Visit: Payer: Medicare Other

## 2022-09-26 IMAGING — CT CT CHEST-ABD-PELV W/ CM
3 of 5 series · 14 of 36 positions shown, 16 images · IV contrast (APPLIED)
Comparison: Multiple exams, including 04/05/2020

CLINICAL DATA: Restaging of prostate cancer

EXAM:
CT CHEST, ABDOMEN, AND PELVIS WITH CONTRAST
TECHNIQUE: Multidetector CT imaging of the chest, abdomen and pelvis was
performed following the standard protocol during bolus
administration of intravenous contrast.
CONTRAST:  80mL OMNIPAQUE IOHEXOL 350 MG/ML SOLN

[Series 2: cap with · axial · 0.73mm/px · z∈[-569,-49]mm · 9 of 131 slices shown, 11 images]
[im 14/131  mediastinal]
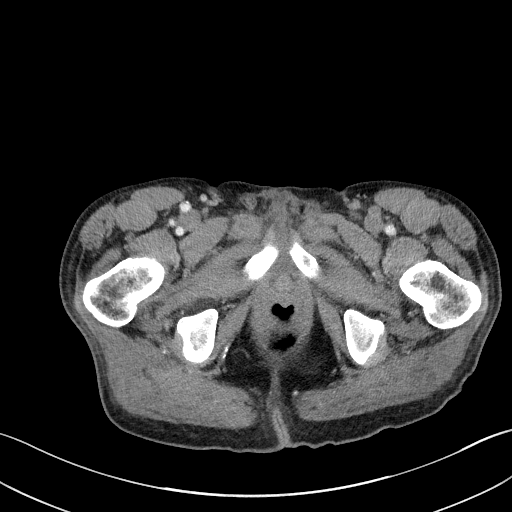
[im 14/131  bone]
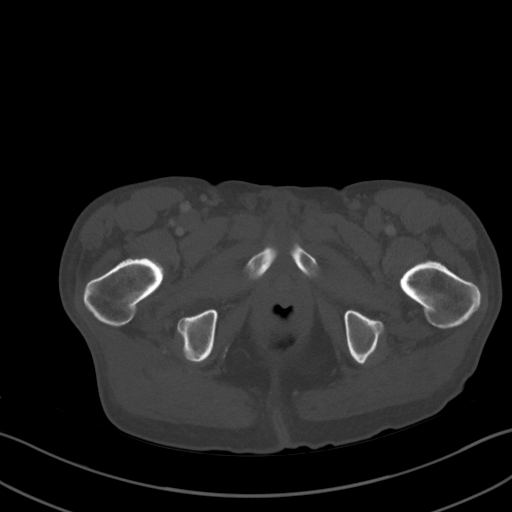
[im 27/131  mediastinal]
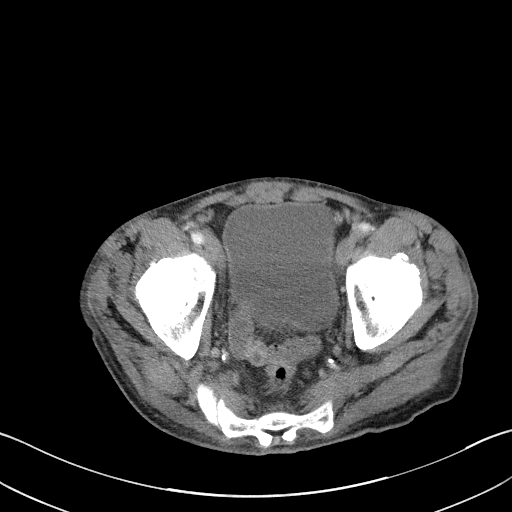
[im 40/131  mediastinal]
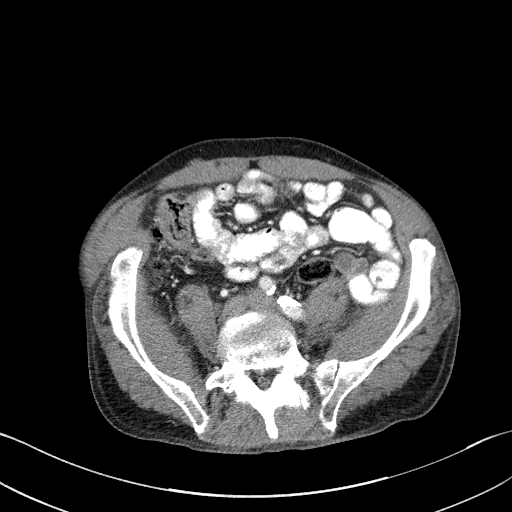
[im 53/131  mediastinal]
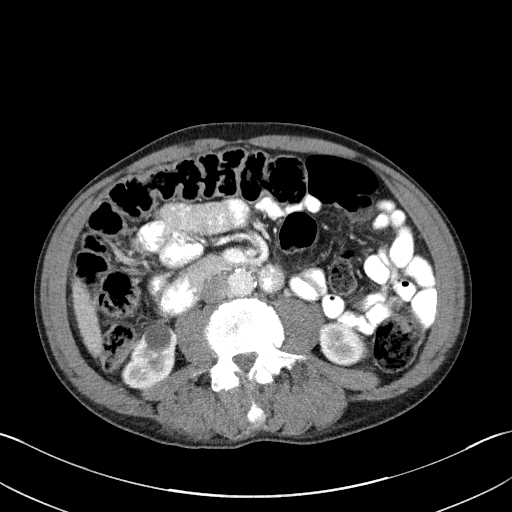
[im 66/131  mediastinal]
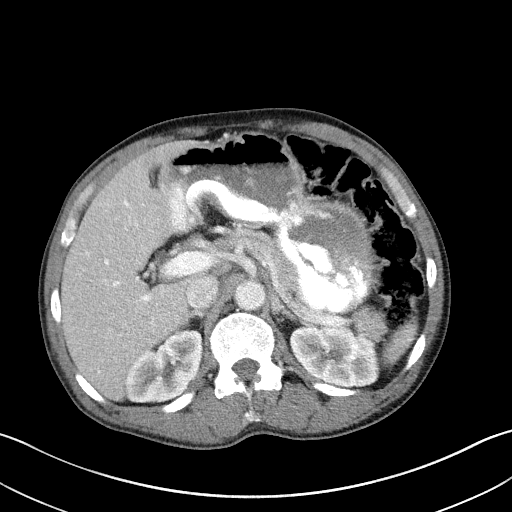
[im 79/131  mediastinal]
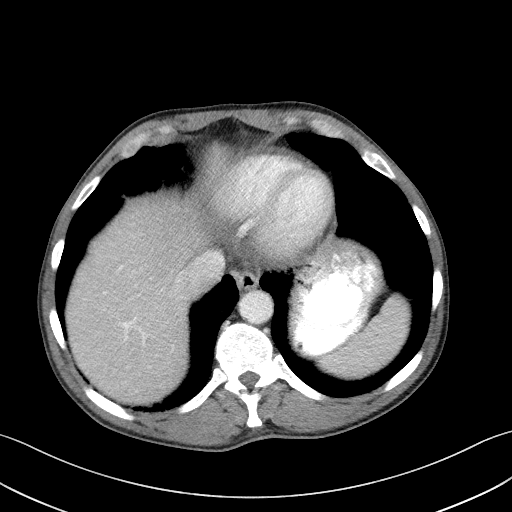
[im 92/131  mediastinal]
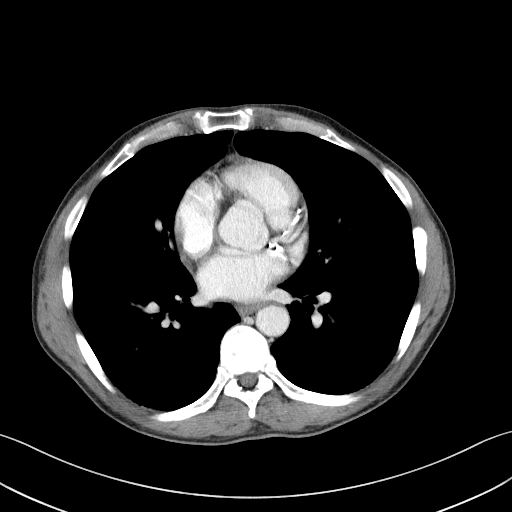
[im 105/131  mediastinal]
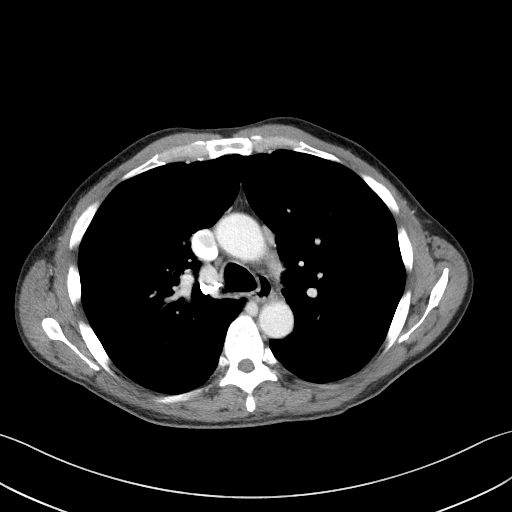
[im 118/131  mediastinal]
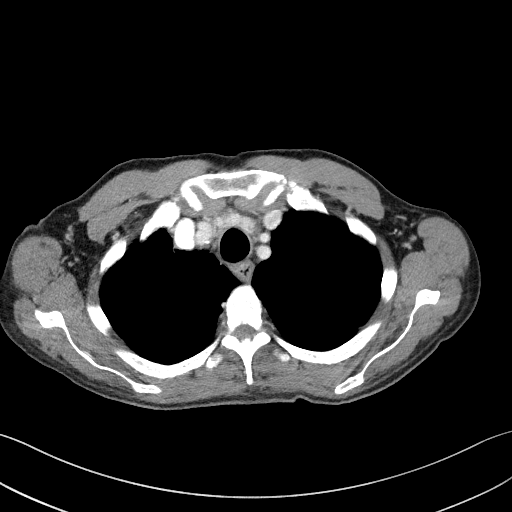
[im 118/131  bone]
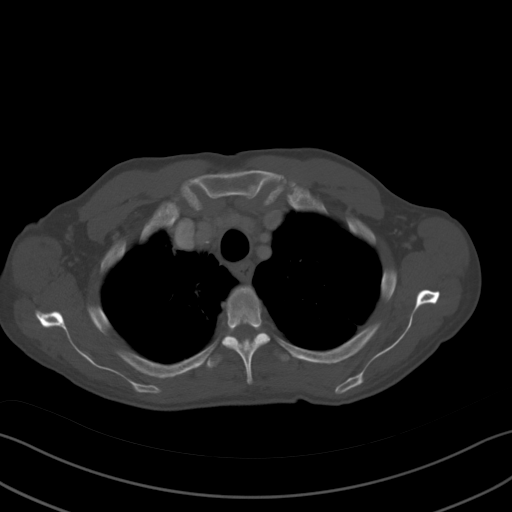

[Series 4: lung · axial · 0.68mm/px · z∈[-270,-222]mm · 2 of 155 slices shown]
[im 12/155  bone]
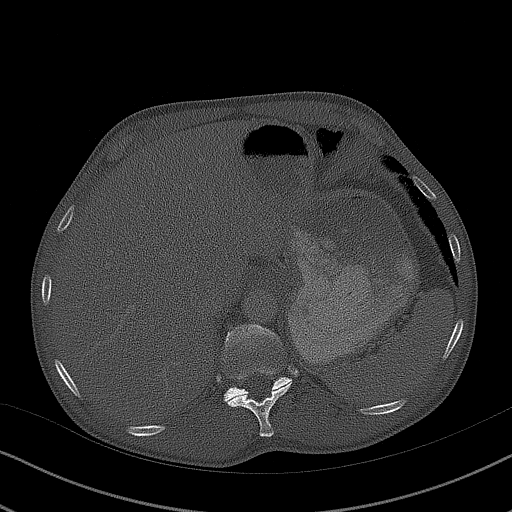
[im 36/155  bone]
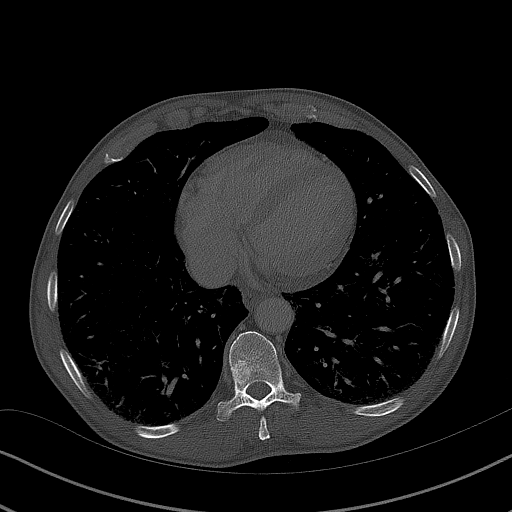

[Series 5: coronals · coronal · 0.76mm/px · 3 of 137 slices shown]
[im 28/137  mediastinal]
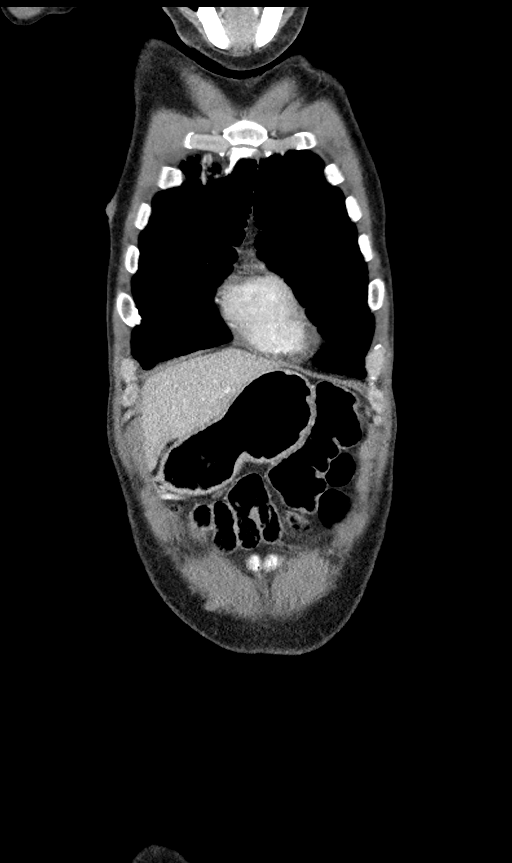
[im 55/137  mediastinal]
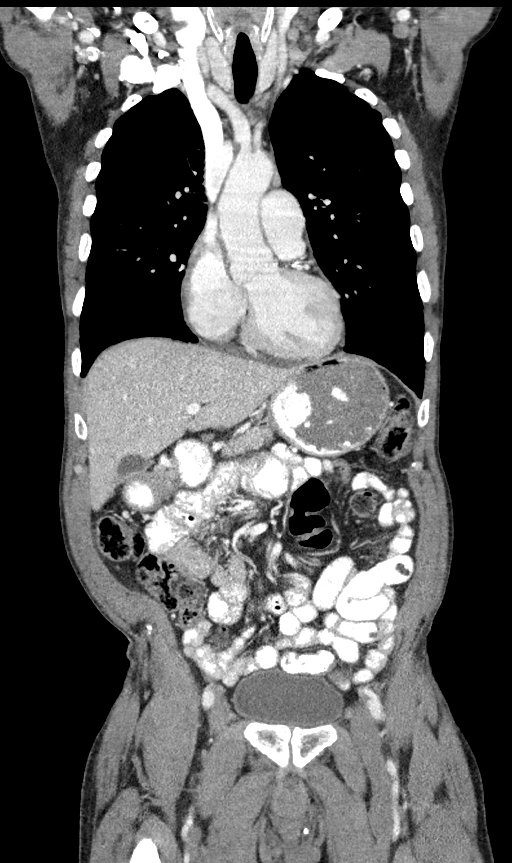
[im 82/137  mediastinal]
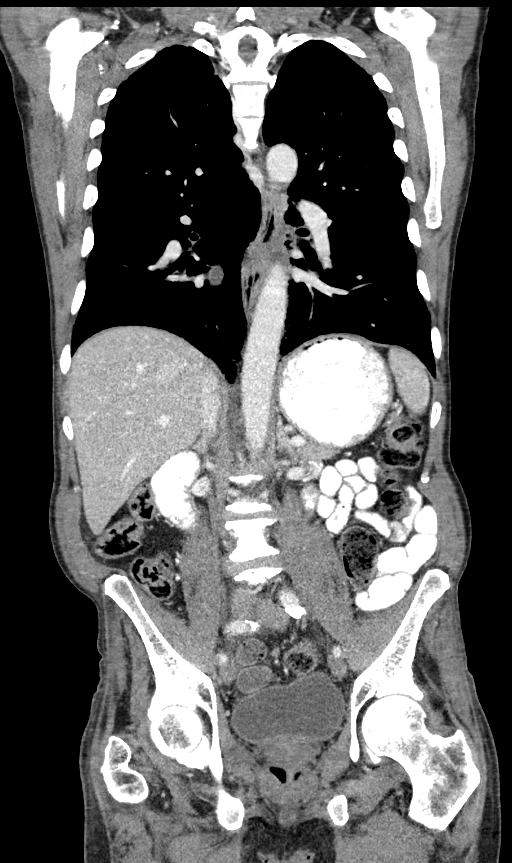

[14 of 36 positions shown; findings below may reference images not displayed]

FINDINGS: CT CHEST FINDINGS

Cardiovascular: Coronary, aortic arch, and branch vessel
atherosclerotic vascular disease. Small pericardial effusion.

Mediastinum/Nodes: Unremarkable

Lungs/Pleura: Emphysema. Stable medial scarring at the right lung
apex. Airway thickening is present, suggesting bronchitis or
reactive airways disease. Mild bronchiectasis peripherally in the
right lower lobe. Notable mucus in the right mainstem bronchus and
right lower lobe bronchus.

Musculoskeletal: Late phase healing of the left lateral seventh rib.
Stable 1.6 by 1.2 cm sclerotic lesion eccentric to the right in the
T10 vertebral body, previously the same by my measurements.

CT ABDOMEN PELVIS FINDINGS

Hepatobiliary: Mildly contracted gallbladder. Otherwise
unremarkable.

Pancreas: Unremarkable

Spleen: Unremarkable

Adrenals/Urinary Tract: Fluid density lesion of the right kidney
lower pole measures 1.9 cm transverse, unchanged from prior,
appearance most compatible with renal cyst. Adrenal glands
unremarkable. The urinary bladder appears normal.

Stomach/Bowel: Mildly redundant sigmoid colon extends up into the
left upper quadrant.

Vascular/Lymphatic: Atherosclerosis is present, including aortoiliac
atherosclerotic disease. Interval occlusion of the left proximal
SFA, with deep branches appearing patent.

Reproductive: Unremarkable

Other: No supplemental non-categorized findings.

Musculoskeletal: Chronic impacted and likely pathologic fracture of
the upper sacrum noted, with fracture plane extending to the S1
superior endplate as before, and a similar degree of associated
sclerosis in the involved fragments with peripheral lucency.
Degenerative endplate findings in the lumbar spine at L2-3.

Bilateral degenerative hip arthropathy, right greater than left.
IMPRESSION: 1. Stable appearance of osseous metastatic lesions.
2. Systemic atherosclerosis with interval occlusion of the left
superficial femoral artery, but with patent profunda branches. This
could be chronic but was not present 6 months ago. Consider vascular
referral and correlate with any symptoms of claudication or new
symptoms related to the left lower extremity.
3. Late phase healing of left 7th rib fracture laterally.
4. Other imaging findings of potential clinical significance: Aortic
Atherosclerosis ([id number]-F9X.X). Coronary atherosclerosis. Small
pericardial effusion. Emphysema ([id number]-[id number].A). Airway thickening is
present, suggesting bronchitis or reactive airways disease. Late
phase healing of the left lateral 7th rib fracture. Redundant
sigmoid colon extends into the left upper quadrant. Bilateral
degenerative hip arthropathy.

## 2022-10-21 ENCOUNTER — Ambulatory Visit: Payer: Medicare Other

## 2023-01-20 ENCOUNTER — Ambulatory Visit: Payer: Medicare Other
